# Patient Record
Sex: Female | Born: 1960
Health system: Southern US, Community
[De-identification: ages and names within clinical notes are randomized; demographics above are authoritative.]

## PROBLEM LIST (undated history)

## (undated) DIAGNOSIS — K760 Fatty (change of) liver, not elsewhere classified: Secondary | ICD-10-CM

## (undated) DIAGNOSIS — M419 Scoliosis, unspecified: Secondary | ICD-10-CM

## (undated) DIAGNOSIS — M7989 Other specified soft tissue disorders: Secondary | ICD-10-CM

## (undated) DIAGNOSIS — F32A Depression, unspecified: Secondary | ICD-10-CM

## (undated) DIAGNOSIS — M2550 Pain in unspecified joint: Secondary | ICD-10-CM

## (undated) DIAGNOSIS — I10 Essential (primary) hypertension: Secondary | ICD-10-CM

## (undated) DIAGNOSIS — I2510 Atherosclerotic heart disease of native coronary artery without angina pectoris: Secondary | ICD-10-CM

## (undated) DIAGNOSIS — G4730 Sleep apnea, unspecified: Secondary | ICD-10-CM

## (undated) DIAGNOSIS — E785 Hyperlipidemia, unspecified: Secondary | ICD-10-CM

## (undated) DIAGNOSIS — G40909 Epilepsy, unspecified, not intractable, without status epilepticus: Secondary | ICD-10-CM

## (undated) DIAGNOSIS — R0602 Shortness of breath: Secondary | ICD-10-CM

## (undated) DIAGNOSIS — G4733 Obstructive sleep apnea (adult) (pediatric): Secondary | ICD-10-CM

## (undated) DIAGNOSIS — E039 Hypothyroidism, unspecified: Secondary | ICD-10-CM

## (undated) DIAGNOSIS — I428 Other cardiomyopathies: Secondary | ICD-10-CM

## (undated) DIAGNOSIS — Z87891 Personal history of nicotine dependence: Secondary | ICD-10-CM

## (undated) DIAGNOSIS — F329 Major depressive disorder, single episode, unspecified: Secondary | ICD-10-CM

## (undated) DIAGNOSIS — M549 Dorsalgia, unspecified: Secondary | ICD-10-CM

## (undated) DIAGNOSIS — E119 Type 2 diabetes mellitus without complications: Secondary | ICD-10-CM

## (undated) DIAGNOSIS — I420 Dilated cardiomyopathy: Principal | ICD-10-CM

## (undated) DIAGNOSIS — J449 Chronic obstructive pulmonary disease, unspecified: Secondary | ICD-10-CM

## (undated) DIAGNOSIS — I5042 Chronic combined systolic (congestive) and diastolic (congestive) heart failure: Secondary | ICD-10-CM

## (undated) DIAGNOSIS — F419 Anxiety disorder, unspecified: Secondary | ICD-10-CM

## (undated) HISTORY — PX: UMBILICAL HERNIA REPAIR: SHX196

## (undated) HISTORY — PX: FASCIOTOMY: SHX132

## (undated) HISTORY — DX: Chronic combined systolic (congestive) and diastolic (congestive) heart failure: I50.42

## (undated) HISTORY — DX: Chronic obstructive pulmonary disease, unspecified: J44.9

## (undated) HISTORY — DX: Sleep apnea, unspecified: G47.30

## (undated) HISTORY — DX: Morbid (severe) obesity due to excess calories: E66.01

## (undated) HISTORY — DX: Obstructive sleep apnea (adult) (pediatric): G47.33

## (undated) HISTORY — DX: Type 2 diabetes mellitus without complications: E11.9

## (undated) HISTORY — DX: Other cardiomyopathies: I42.8

## (undated) HISTORY — PX: ABDOMINAL HYSTERECTOMY: SHX81

## (undated) HISTORY — DX: Essential (primary) hypertension: I10

## (undated) HISTORY — DX: Hyperlipidemia, unspecified: E78.5

## (undated) HISTORY — DX: Other specified soft tissue disorders: M79.89

## (undated) HISTORY — PX: OTHER SURGICAL HISTORY: SHX169

## (undated) HISTORY — DX: Fatty (change of) liver, not elsewhere classified: K76.0

## (undated) HISTORY — DX: Depression, unspecified: F32.A

## (undated) HISTORY — DX: Shortness of breath: R06.02

## (undated) HISTORY — DX: Anxiety disorder, unspecified: F41.9

## (undated) HISTORY — DX: Dorsalgia, unspecified: M54.9

## (undated) HISTORY — PX: CARPAL TUNNEL RELEASE: SHX101

## (undated) HISTORY — DX: Pain in unspecified joint: M25.50

## (undated) HISTORY — DX: Personal history of nicotine dependence: Z87.891

## (undated) HISTORY — DX: Scoliosis, unspecified: M41.9

## (undated) HISTORY — PX: VESICOVAGINAL FISTULA CLOSURE W/ TAH: SUR271

## (undated) HISTORY — DX: Atherosclerotic heart disease of native coronary artery without angina pectoris: I25.10

## (undated) HISTORY — DX: Epilepsy, unspecified, not intractable, without status epilepticus: G40.909

## (undated) HISTORY — DX: Hypothyroidism, unspecified: E03.9

## (undated) HISTORY — DX: Dilated cardiomyopathy: I42.0

---
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
## 1999-12-30 ENCOUNTER — Encounter (INDEPENDENT_AMBULATORY_CARE_PROVIDER_SITE_OTHER): Payer: Self-pay | Admitting: Specialist

## 1999-12-30 ENCOUNTER — Ambulatory Visit (HOSPITAL_COMMUNITY): Admission: RE | Admit: 1999-12-30 | Discharge: 1999-12-30 | Payer: Self-pay

## 2006-07-14 ENCOUNTER — Encounter: Admission: RE | Admit: 2006-07-14 | Discharge: 2006-07-14 | Payer: Self-pay | Admitting: Neurology

## 2010-07-02 NOTE — Op Note (Signed)
[facility]  Patient:    Judy [name], Judy [name]                     MRN: [id number] Proc. Date: 12/30/99 Adm. Date:  [id number] Attending:  Gennie Alma CC:         Roney Marion, M.D., [facility], [place], [place]                           Operative Report  CENTRAL [place] NUMBER:  [phone].  PREOPERATIVE DIAGNOSIS:  Umbilical hernia.  POSTOPERATIVE DIAGNOSIS:  Umbilical hernia.  OPERATION:  Repair of umbilical hernia with mesh.  SURGEON:  Milus Mallick, M.D.  ANESTHESIA:  General endotracheal.  DESCRIPTION OF PROCEDURE:  Under adequate general endotracheal anesthesia, the patients abdomen was prepared and draped in the usual fashion.  A smile incision was made just below the umbilicus and carrying down through the subcutaneous tissue.  Bleeders were electrocoagulated.  A moderate-sized hernia sac was immediately seen.  The subcutaneous tissue was dissected off of it, down to the deep fascia.  Next, the hernia sac was disconnected from the superior flap, which was the umbilicus.  There was one buttonhole made in the inferior portion of the umbilicus in so doing this and it was repaired with 4-0 Vicryl.  The sac was dissected away from the surrounding subcutaneous tissue, down to the deep fascia.  There was a good deal of preperitoneal fat that was adherent to it and this was excised with Bovie electrocoagulation. The sac was opened and there was omentum that was trying to herniate into it but it was pushed back into the abdomen and a high ligation of the sac was done with 2-0 Vicryl pursestring suture.  The remaining sac was trimmed off and removed from the operative field.  The defect in the fascia was cleared so that there was a margin of normal fascia around it, at least 2.5 cm in all directions.  The defect itself measured 3.5 cm in diameter.  The defect was closed with interrupted sutures of 0 Novofil.  A patch of  Marlex mesh was then placed over the defect, covering it, measuring 2.5 x 5.0 cm in long and short lengths, being oriented transversely.  The mesh was affixed to the fascia with interrupted sutures of 0 Prolene.  Next, the deep subcutaneous tissue was closed over the mesh with interrupted sutures of 3-0 Vicryl.  The subcuticular layer was then reapproximated with a continuous suture of 4-0 Vicryl and half-inch Steri-Strips were applied to the skin.  Sterile dressing was applied. Estimated blood loss for the procedure was negligible.  Patient tolerated the procedure well and left the operating room in satisfactory condition. DD:  12/30/99 TD:  12/30/99 Job: [id number]

## 2011-08-23 ENCOUNTER — Other Ambulatory Visit: Payer: Self-pay | Admitting: Family Medicine

## 2011-08-23 DIAGNOSIS — Q7649 Other congenital malformations of spine, not associated with scoliosis: Secondary | ICD-10-CM

## 2011-08-23 DIAGNOSIS — M5430 Sciatica, unspecified side: Secondary | ICD-10-CM

## 2011-08-23 DIAGNOSIS — M545 Low back pain: Secondary | ICD-10-CM

## 2011-08-23 DIAGNOSIS — M5412 Radiculopathy, cervical region: Secondary | ICD-10-CM

## 2011-08-23 DIAGNOSIS — M542 Cervicalgia: Secondary | ICD-10-CM

## 2011-08-27 ENCOUNTER — Ambulatory Visit
Admission: RE | Admit: 2011-08-27 | Discharge: 2011-08-27 | Disposition: A | Discharge status: Home / Self Care | Payer: BC Managed Care – PPO | Source: Ambulatory Visit | Attending: Family Medicine | Admitting: Family Medicine

## 2011-08-27 DIAGNOSIS — M542 Cervicalgia: Secondary | ICD-10-CM

## 2011-08-27 DIAGNOSIS — M5430 Sciatica, unspecified side: Secondary | ICD-10-CM

## 2011-08-27 DIAGNOSIS — M5412 Radiculopathy, cervical region: Secondary | ICD-10-CM

## 2011-08-27 DIAGNOSIS — M545 Low back pain: Secondary | ICD-10-CM

## 2011-08-27 DIAGNOSIS — Q7649 Other congenital malformations of spine, not associated with scoliosis: Secondary | ICD-10-CM

## 2011-08-27 MED ORDER — GADOBENATE DIMEGLUMINE 529 MG/ML IV SOLN
19.0000 mL | Freq: Once | INTRAVENOUS | Status: AC | PRN
  Administered 2011-08-27: 19 mL via INTRAVENOUS

## 2011-12-15 LAB — HM COLONOSCOPY

## 2012-03-08 DIAGNOSIS — M4030 Flatback syndrome, site unspecified: Secondary | ICD-10-CM | POA: Insufficient documentation

## 2012-05-31 DIAGNOSIS — M25551 Pain in right hip: Secondary | ICD-10-CM | POA: Insufficient documentation

## 2012-05-31 DIAGNOSIS — M79671 Pain in right foot: Secondary | ICD-10-CM | POA: Insufficient documentation

## 2012-09-20 DIAGNOSIS — M6749 Ganglion, multiple sites: Secondary | ICD-10-CM | POA: Insufficient documentation

## 2013-10-07 ENCOUNTER — Institutional Professional Consult (permissible substitution): Payer: BC Managed Care – PPO | Admitting: Internal Medicine

## 2013-10-08 ENCOUNTER — Institutional Professional Consult (permissible substitution): Payer: BC Managed Care – PPO | Admitting: Internal Medicine

## 2013-10-08 ENCOUNTER — Encounter: Payer: Self-pay | Admitting: Internal Medicine

## 2013-10-08 ENCOUNTER — Other Ambulatory Visit (INDEPENDENT_AMBULATORY_CARE_PROVIDER_SITE_OTHER): Payer: BC Managed Care – PPO

## 2013-10-08 ENCOUNTER — Ambulatory Visit (INDEPENDENT_AMBULATORY_CARE_PROVIDER_SITE_OTHER): Payer: BC Managed Care – PPO | Admitting: Internal Medicine

## 2013-10-08 ENCOUNTER — Encounter (INDEPENDENT_AMBULATORY_CARE_PROVIDER_SITE_OTHER): Payer: Self-pay

## 2013-10-08 VITALS — BP 142/100 | HR 91 | Temp 98.8°F | Ht 65.0 in | Wt 247.2 lb

## 2013-10-08 DIAGNOSIS — R0989 Other specified symptoms and signs involving the circulatory and respiratory systems: Secondary | ICD-10-CM

## 2013-10-08 DIAGNOSIS — R0600 Dyspnea, unspecified: Secondary | ICD-10-CM

## 2013-10-08 DIAGNOSIS — R0609 Other forms of dyspnea: Secondary | ICD-10-CM

## 2013-10-08 DIAGNOSIS — J449 Chronic obstructive pulmonary disease, unspecified: Secondary | ICD-10-CM

## 2013-10-08 DIAGNOSIS — R918 Other nonspecific abnormal finding of lung field: Secondary | ICD-10-CM

## 2013-10-08 LAB — TSH: TSH: 1.68 u[IU]/mL (ref 0.35–4.50)

## 2013-10-08 LAB — SEDIMENTATION RATE: Sed Rate: 18 mm/hr (ref 0–22)

## 2013-10-08 LAB — BRAIN NATRIURETIC PEPTIDE: PRO B NATRI PEPTIDE: 452 pg/mL — AB (ref 0.0–100.0)

## 2013-10-08 MED ORDER — CLONIDINE HCL 0.1 MG PO TABS: 0.1000 mg | ORAL_TABLET | Freq: Two times a day (BID) | ORAL | Status: DC

## 2013-10-08 NOTE — Progress Notes (Signed)
Subjective:     Patient ID: Judy [name], female   DOB: Jul 08, 1960,  MRN: [id number]  HPI  37 yowf quit smoking 2007 with cough that resolved and no resp problems until winter 2015 with doe x walking the dog s much in terms of cough then 10/04/13 sudden sense she couldn't get a breath and coughed up blood x one tsp plus slt green mucus  > better with saba and started on inhalers/ abx  >  better and pred taper.   10/08/2013 1st [facility] office visit/ Davarious Tumbleson Chief Complaint  Patient presents with  . Pulmonary Consult    Referred by [facility] Homans Cox-sob with exertion x 6-8 mths.,worse since 4 days ago,occass. cough-tsp. of blood 4 days ago,usually unprod.,no wheezing,midchest tightness,no fcs,Had neb. since yesterday(used 2x yesterday)   baseline = 50 ft   to mailbox and sob when gets there.   No obvious  patterns in day to day or daytime variabilty or assoc pleuritic or ex  cp or   subjective wheeze overt sinus or hb symptoms. No unusual exp hx or h/o childhood pna/ asthma or knowledge of premature birth.  Sleeping ok without nocturnal  or early am exacerbation  of respiratory  c/o's or need for noct saba. Also denies any obvious fluctuation of symptoms with weather or environmental changes or other aggravating or alleviating factors except as outlined above   Current Medications, Allergies, Complete Past Medical History, Past Surgical History, Family History, and Social History were reviewed in [employer] record.  ROS     ROS  The following are not active complaints unless bolded sore throat, dysphagia, dental problems, itching, sneezing,  nasal congestion or excess/ purulent secretions, ear ache,   fever, chills, sweats, unintended wt loss, pleuritic or exertional cp, hemoptysis,  orthopnea pnd or leg swelling, presyncope, palpitations, heartburn, abdominal pain, anorexia, nausea, vomiting, diarrhea  or change in bowel or urinary habits, change in stools or  urine, dysuria,hematuria,  rash, arthralgias, visual complaints, headache, numbness weakness or ataxia or problems with walking or coordination,  change in mood/affect or memory.                     Review of Systems     Objective:   Physical Exam  amb wf nad  Wt Readings from Last 3 Encounters:  10/08/13 247 lb 3.2 oz (112.129 kg)     HEENT: nl dentition, turbinates, and orophanx. Nl external ear canals without cough reflex   NECK :  without JVD/Nodes/TM/ nl carotid upstrokes bilaterally   LUNGS: no acc muscle use, clear to A and P bilaterally without cough on insp or exp maneuvers   CV:  RRR  no s3 or murmur or increase in P2, no edema   ABD:  soft and nontender with nl excursion in the supine position. No bruits or organomegaly, bowel sounds nl  MS:  warm without deformities, calf tenderness, cyanosis or clubbing  SKIN: warm and dry without lesions    NEURO:  alert, approp, no deficits   cxr 10/04/13 nl with nl cbc, bmet and d dimer   CT s contrast 10/07/13  Patchy GG changes RML and some in RUL and RLL     Lab Results  Component Value Date   ESRSEDRATE 18 10/08/2013     Lab Results  Component Value Date   PROBNP 452.0* 10/08/2013     Lab Results  Component Value Date   TSH 1.68 10/08/2013  Assessment:

## 2013-10-08 NOTE — Patient Instructions (Addendum)
Clonidine 0.1 mg twice daily until you return   Plan A = automatic = symbiocort 160 Take 2 puffs first thing in am and then another 2 puffs about 12 hours later.   Plan B = Only use your albuterol (proair)as a rescue medication to be used if you can't catch your breath by resting or doing a relaxed purse lip breathing pattern.  - The less you use it, the better it will work when you need it. - Ok to use up to 2 puffs  every 4 hours if you must but call for immediate appointment if use goes up over your usual need - Don't leave home without it !!  (think of it like the spare tire for your car)   Plan C = nebulizer albuterol, ok to use up to every 4 hours if can't get relief from Plan B  GERD (REFLUX)  is an extremely common cause of respiratory symptoms, many times with no significant heartburn at all.    It can be treated with medication, but also with lifestyle changes including avoidance of late meals, excessive alcohol, smoking cessation, and avoid fatty foods, chocolate, peppermint, colas, red wine, and acidic juices such as orange juice.  NO MINT OR MENTHOL PRODUCTS SO NO COUGH DROPS  USE SUGARLESS CANDY INSTEAD (jolley ranchers or Stover's)  NO OIL BASED VITAMINS - use powdered substitutes.   Please remember to go to the lab   department downstairs for your tests - we will call you with the results when they are available.     Please schedule a follow up office visit in 2 weeks, sooner if needed

## 2013-10-09 DIAGNOSIS — J449 Chronic obstructive pulmonary disease, unspecified: Secondary | ICD-10-CM | POA: Insufficient documentation

## 2013-10-09 DIAGNOSIS — R918 Other nonspecific abnormal finding of lung field: Secondary | ICD-10-CM | POA: Insufficient documentation

## 2013-10-09 NOTE — Assessment & Plan Note (Addendum)
-   10/08/2013  Walked RA x 2 laps @ 185 ft each stopped due to  Sob and back pain, no desat   Symptoms are markedly disproportionate to objective findings and not clear this is a lung problem but pt does appear to have difficult airway management issues. DDX of  difficult airways management all start with A and  include Adherence, Ace Inhibitors, Acid Reflux, Active Sinus Disease, Alpha 1 Antitripsin deficiency, Anxiety masquerading as Airways dz,  ABPA,  allergy(esp in young), Aspiration (esp in elderly), Adverse effects of DPI,  Active smokers, plus two Bs  = Bronchiectasis and Beta blocker use..and one C= CHF  Adherence is always the initial "prime suspect" and is a multilayered concern that requires a "trust but verify" approach in every patient - starting with knowing how to use medications, especially inhalers, correctly, keeping up with refills and understanding the fundamental difference between maintenance and prns vs those medications only taken for a very short course and then stopped and not refilled.   ? chf > bnp intermediate, try clonidine 0.1 mg bid for hbp and pain disorder  ? Acid (or non-acid) GERD > always difficult to exclude as up to 75% of pts in some series report no assoc GI/ Heartburn symptoms> rec  diet restrictions/ reviewed and instructions given in writing.

## 2013-10-09 NOTE — Assessment & Plan Note (Signed)
bnp >  esr does not suggest she has an active inflammatory component at this time but will f/u at 2 weeks

## 2013-10-09 NOTE — Progress Notes (Signed)
Quick Note:  Spoke with pt and notified of results per Dr. Wert. Pt verbalized understanding and denied any questions.  ______ 

## 2013-10-09 NOTE — Assessment & Plan Note (Signed)
Not clear how much underlying copd is present vs AB related to bronchopneumonia   The proper method of use, as well as anticipated side effects, of a metered-dose inhaler are discussed and demonstrated to the patient. Improved effectiveness after extensive coaching during this visit to a level of approximately  75% so continue symbiocrt for now 2bid and prn saba and then regroup in 4 weeks

## 2013-10-23 ENCOUNTER — Ambulatory Visit (INDEPENDENT_AMBULATORY_CARE_PROVIDER_SITE_OTHER): Payer: BC Managed Care – PPO | Admitting: Internal Medicine

## 2013-10-23 ENCOUNTER — Encounter: Payer: Self-pay | Admitting: Internal Medicine

## 2013-10-23 VITALS — BP 104/70 | HR 77 | Temp 99.0°F | Ht 66.0 in | Wt 241.0 lb

## 2013-10-23 DIAGNOSIS — I10 Essential (primary) hypertension: Secondary | ICD-10-CM

## 2013-10-23 DIAGNOSIS — J449 Chronic obstructive pulmonary disease, unspecified: Secondary | ICD-10-CM

## 2013-10-23 DIAGNOSIS — R918 Other nonspecific abnormal finding of lung field: Secondary | ICD-10-CM

## 2013-10-23 DIAGNOSIS — J4489 Other specified chronic obstructive pulmonary disease: Secondary | ICD-10-CM

## 2013-10-23 MED ORDER — BUDESONIDE-FORMOTEROL FUMARATE 160-4.5 MCG/ACT IN AERO: 2.0000 | INHALATION_SPRAY | Freq: Two times a day (BID) | RESPIRATORY_TRACT | Status: DC

## 2013-10-23 NOTE — Patient Instructions (Addendum)
   Plan A = automatic = symbiocort 160 Take 2 puffs first thing in am and then another 2 puffs about 12 hours later.   Plan B = Only use your albuterol (proair)as a rescue medication to be used if you can't catch your breath by resting or doing a relaxed purse lip breathing pattern.  - The less you use it, the better it will work when you need it. - Ok to use up to 2 puffs  every 4 hours if you must but call for immediate appointment if use goes up over your usual need - Don't leave home without it !!  (think of it like the spare tire for your car)          Please schedule a follow up office visit in 4 weeks, sooner if needed with pfts

## 2013-10-23 NOTE — Progress Notes (Signed)
Subjective:     Patient ID: Judy [name], female   DOB: [birth year],  MRN: [id number]    Brief patient profile:  27 yowf quit smoking 2007 with cough that resolved and no resp problems until winter 2015 with doe x walking the dog s much in terms of cough then 10/04/13 sudden sense she couldn't get a breath and coughed up blood x one tsp plus slt green mucus  > better with saba and started on inhalers/ abx  >  better and pred taper.  History of Present Illness  10/08/2013 1st [facility] office visit/ Judy [name] Chief Complaint  Patient presents with  . Pulmonary Consult    Referred by Penn[facility] Homans Cox-sob with exertion x 6-8 mths.,worse since 4 days ago,occass. cough-tsp. of blood 4 days ago,usually unprod.,no wheezing,midchest tightness,no fcs,Had neb. since yesterday(used 2x yesterday)   baseline = 173ft   to mailbox and sob when gets there.  rec Clonidine 0.1 mg twice daily until you return  Plan A = automatic = symbiocort 160 Take 2 puffs first thing in am and then another 2 puffs about 12 hours later.  Plan B = Only use your albuterol (proair)as a rescue medication    Plan C = nebulizer albuterol, ok to use up to every 4 hours if can't get relief from Plan B GERD diet           10/23/2013 f/u ov/Judy [name] re: unexplained doe/better on symbicort not needing rescue saba Chief Complaint  Patient presents with  . Follow-up    [name],  but not back to baseline yet. Denies any cough. No new co's today and has not needed neb.   cough resolved / happy with clonidine for bp, back on asa s hemoptysis    No obvious  patterns in day to day or daytime variabilty or assoc pleuritic or ex  cp or   subjective wheeze overt sinus or hb symptoms. No unusual exp hx or h/o childhood pna/ asthma or knowledge of premature birth.  Sleeping ok without nocturnal  or early am exacerbation  of respiratory  c/o's or need for noct saba. Also denies any obvious fluctuation of  symptoms with weather or environmental changes or other aggravating or alleviating factors except as outlined above   Current Medications, Allergies, Complete Past Medical History, Past Surgical History, Family History, and Social History were reviewed in [employer] record.     ROS  The following are not active complaints unless bolded sore throat, dysphagia, dental problems, itching, sneezing,  nasal congestion or excess/ purulent secretions, ear ache,   fever, chills, sweats, unintended wt loss, pleuritic or exertional cp, hemoptysis,  orthopnea pnd or leg swelling, presyncope, palpitations, heartburn, abdominal pain, anorexia, nausea, vomiting, diarrhea  or change in bowel or urinary habits, change in stools or urine, dysuria,hematuria,  rash, arthralgias, visual complaints, headache, numbness weakness or ataxia or problems with walking or coordination,  change in mood/affect or memory.                          Objective:   Physical Exam  amb wf nad   Wt Readings from Last 3 Encounters:  10/23/13 241 lb (109.317 kg)  10/08/13 247 lb 3.2 oz (112.129 kg)      HEENT: nl dentition, turbinates, and orophanx. Nl external ear canals without cough reflex   NECK :  without JVD/Nodes/TM/ nl carotid upstrokes bilaterally   LUNGS:  no acc muscle use, clear to A and P bilaterally without cough on insp or exp maneuvers   CV:  RRR  no s3 or murmur or increase in P2, no edema   ABD:  soft and nontender with nl excursion in the supine position. No bruits or organomegaly, bowel sounds nl  MS:  warm without deformities, calf tenderness, cyanosis or clubbing  SKIN: warm and dry without lesions      cxr 10/04/13 nl with nl cbc, bmet and d dimer   CT s contrast 10/07/13  Patchy GG changes RML and some in RUL and RLL     Lab Results  Component Value Date   ESRSEDRATE 18 10/08/2013     Lab Results  Component Value Date   PROBNP 452.0* 10/08/2013     Lab  Results  Component Value Date   TSH 1.68 10/08/2013           Assessment:

## 2013-10-26 DIAGNOSIS — I152 Hypertension secondary to endocrine disorders: Secondary | ICD-10-CM | POA: Insufficient documentation

## 2013-10-26 DIAGNOSIS — I10 Essential (primary) hypertension: Secondary | ICD-10-CM | POA: Insufficient documentation

## 2013-10-26 DIAGNOSIS — E1159 Type 2 diabetes mellitus with other circulatory complications: Secondary | ICD-10-CM | POA: Insufficient documentation

## 2013-10-26 NOTE — Assessment & Plan Note (Signed)
Improving on clonidine which was picked because of her pain disorder (synergistic with opioids)

## 2013-10-26 NOTE — Assessment & Plan Note (Signed)
See CT chest 10/07/13 ? Bronchopna/ blood/edema?  Since so many lobes involved this was either pna or alv hem ? From hbp > clinically has resolved and since only seen on ct chest no need to repeat cxr at this point

## 2013-10-26 NOTE — Assessment & Plan Note (Signed)
Improving on symbicort / no need for saba > no change rx  Needs to return for pfts

## 2013-11-26 ENCOUNTER — Ambulatory Visit: Payer: BC Managed Care – PPO | Admitting: Internal Medicine

## 2013-11-26 ENCOUNTER — Other Ambulatory Visit: Payer: Self-pay | Admitting: Internal Medicine

## 2013-11-26 DIAGNOSIS — R0600 Dyspnea, unspecified: Secondary | ICD-10-CM

## 2013-11-27 ENCOUNTER — Ambulatory Visit (INDEPENDENT_AMBULATORY_CARE_PROVIDER_SITE_OTHER): Payer: BC Managed Care – PPO | Admitting: Internal Medicine

## 2013-11-27 ENCOUNTER — Encounter: Payer: Self-pay | Admitting: Internal Medicine

## 2013-11-27 VITALS — BP 124/90 | HR 89 | Temp 98.9°F | Ht 64.5 in | Wt 237.0 lb

## 2013-11-27 DIAGNOSIS — J449 Chronic obstructive pulmonary disease, unspecified: Secondary | ICD-10-CM

## 2013-11-27 DIAGNOSIS — I10 Essential (primary) hypertension: Secondary | ICD-10-CM

## 2013-11-27 DIAGNOSIS — R0600 Dyspnea, unspecified: Secondary | ICD-10-CM

## 2013-11-27 LAB — PULMONARY FUNCTION TEST
DL/VA % PRED: 72 %
DL/VA: 3.51 ml/min/mmHg/L
DLCO UNC % PRED: 71 %
DLCO unc: 17.89 ml/min/mmHg
FEF 25-75 POST: 0.84 L/s
FEF 25-75 Pre: 0.7 L/sec
FEF2575-%Change-Post: 19 %
FEF2575-%PRED-POST: 31 %
FEF2575-%PRED-PRE: 26 %
FEV1-%Change-Post: 7 %
FEV1-%PRED-POST: 58 %
FEV1-%Pred-Pre: 54 %
FEV1-POST: 1.62 L
FEV1-PRE: 1.51 L
FEV1FVC-%CHANGE-POST: 0 %
FEV1FVC-%PRED-PRE: 65 %
FEV6-%Change-Post: 5 %
FEV6-%Pred-Post: 87 %
FEV6-%Pred-Pre: 82 %
FEV6-POST: 3.01 L
FEV6-Pre: 2.84 L
FEV6FVC-%CHANGE-POST: 0 %
FEV6FVC-%Pred-Post: 100 %
FEV6FVC-%Pred-Pre: 100 %
FVC-%CHANGE-POST: 6 %
FVC-%Pred-Post: 87 %
FVC-%Pred-Pre: 82 %
FVC-Post: 3.09 L
FVC-Pre: 2.92 L
PRE FEV1/FVC RATIO: 52 %
PRE FEV6/FVC RATIO: 97 %
Post FEV1/FVC ratio: 52 %
Post FEV6/FVC ratio: 97 %
RV % PRED: 106 %
RV: 2 L
TLC % pred: 101 %
TLC: 5.2 L

## 2013-11-27 NOTE — Progress Notes (Signed)
Subjective:     Patient ID: Judy [name], female   DOB: 1960-07-09,  MRN: [id number]    Brief patient profile:  41 yowf quit smoking 2007 with cough that resolved and no resp problems until winter 2015 with doe x walking the dog s much in terms of cough then 10/04/13 sudden sense she couldn't get a breath and coughed up blood x one tsp plus slt green mucus  > better with saba and started on inhalers/ abx  >  better and pred taper.  History of Present Illness  10/08/2013 1st [facility] office visit/ Maury Bamba Chief Complaint  Patient presents with  . Pulmonary Consult    Referred by [facility] Homans Cox-sob with exertion x 6-8 mths.,worse since 4 days ago,occass. cough-tsp. of blood 4 days ago,usually unprod.,no wheezing,midchest tightness,no fcs,Had neb. since yesterday(used 2x yesterday)   baseline = 166ft   to mailbox and sob when gets there.  rec Clonidine 0.1 mg twice daily until you return  Plan A = automatic = symbiocort 160 Take 2 puffs first thing in am and then another 2 puffs about 12 hours later.  Plan B = Only use your albuterol (proair)as a rescue medication    Plan C = nebulizer albuterol, ok to use up to every 4 hours if can't get relief from Plan B GERD diet           10/23/2013 f/u ov/Keanu Lesniak re: unexplained doe/better on symbicort not needing rescue saba Chief Complaint  Patient presents with  . Follow-up    [name],  but not back to baseline yet. Denies any cough. No new co's today and has not needed neb.   cough resolved / happy with clonidine for bp, back on asa s hemoptysis   rec Plan A = automatic = symbiocort 160 Take 2 puffs first thing in am and then another 2 puffs about 12 hours later.  Plan B = Only use your albuterol (proair) as needed    11/27/2013 f/u ov/Annessa Satre re: GOLD II copd  Chief Complaint  Patient presents with  . Follow-up    PFT done today. Pt states that her breathing is slightly improved since the last visit. No  new co's today. She has not used rescue inhaler, but has been using symbicort prn- 3 to 4 x per wk on average.   not using any saba at all    Not limited by breathing from desired activities    No obvious  patterns in day to day or daytime variabilty or assoc cough or pleuritic or ex  cp or   subjective wheeze overt sinus or hb symptoms. No unusual exp hx or h/o childhood pna/ asthma or knowledge of premature birth.  Sleeping ok without nocturnal  or early am exacerbation  of respiratory  c/o's or need for noct saba. Also denies any obvious fluctuation of symptoms with weather or environmental changes or other aggravating or alleviating factors except as outlined above   Current Medications, Allergies, Complete Past Medical History, Past Surgical History, Family History, and Social History were reviewed in [employer] record.     ROS  The following are not active complaints unless bolded sore throat, dysphagia, dental problems, itching, sneezing,  nasal congestion or excess/ purulent secretions, ear ache,   fever, chills, sweats, unintended wt loss, pleuritic or exertional cp, hemoptysis,  orthopnea pnd or leg swelling, presyncope, palpitations, heartburn, abdominal pain, anorexia, nausea, vomiting, diarrhea  or change in bowel  or urinary habits, change in stools or urine, dysuria,hematuria,  rash, arthralgias, visual complaints, headache, numbness weakness or ataxia or problems with walking or coordination,  change in mood/affect or memory.                          Objective:   Physical Exam  amb wf nad  11/27/2013     237  Wt Readings from Last 3 Encounters:  10/23/13 241 lb (109.317 kg)  10/08/13 247 lb 3.2 oz (112.129 kg)      HEENT: nl dentition, turbinates, and orophanx. Nl external ear canals without cough reflex   NECK :  without JVD/Nodes/TM/ nl carotid upstrokes bilaterally   LUNGS: no acc muscle use, clear to A and P bilaterally without  cough on insp or exp maneuvers   CV:  RRR  no s3 or murmur or increase in P2, no edema   ABD:  soft and nontender with nl excursion in the supine position. No bruits or organomegaly, bowel sounds nl  MS:  warm without deformities, calf tenderness, cyanosis or clubbing  SKIN: warm and dry without lesions      cxr 10/04/13 nl with nl cbc, bmet and d dimer   CT s contrast 10/07/13  Patchy GG changes RML and some in RUL and RLL     Lab Results  Component Value Date   ESRSEDRATE 18 10/08/2013     Lab Results  Component Value Date   PROBNP 452.0* 10/08/2013     Lab Results  Component Value Date   TSH 1.68 10/08/2013           Assessment:

## 2013-11-27 NOTE — Assessment & Plan Note (Addendum)
With bnp > 100 could have  an element of diastolic chf previously but doing great on clonidine so no need to change rx for now  F/u Dr cox

## 2013-11-27 NOTE — Patient Instructions (Addendum)
GOLD II copd is mild to moderate and unlikely to worsen unless you resume smoking  Symbicort 160 Take 2 puffs first thing in am and then another 2 puffs about 12 hours later.    If you are satisfied with your treatment plan,  let your doctor know and he/she can either refill your medications or you can return here when your prescription runs out.     If in any way you are not 100% satisfied,  please tell us.  If 100% better, tell your friends!  Pulmonary follow up is as needed

## 2013-11-27 NOTE — Assessment & Plan Note (Addendum)
-   10/08/2013 p extensive coaching HFA effectiveness =    75%  - 11/27/2013  PFTs   FEV1  1.51(54%) ratio 52 and and no better p B2 and DLCO  71  The proper method of use, as well as anticipated side effects, of a metered-dose inhaler are discussed and demonstrated to the patient. Improved effectiveness after extensive coaching during this visit to a level of approximately  90%     I reviewed the Flethcher curve with patient that basically indicates  if you quit smoking when your best day FEV1 is still well preserved (as is the case here)  it is highly unlikely you will progress to severe disease and informed the patient there was no medication on the market that has proven to change the curve or the likelihood of progression.  Therefore stopping smoking and maintaining abstinence is the most important aspect of care, not choice of inhalers or for that matter, doctors.   Treatment is all directed at symptoms from this point forward, so ok to "titrate or adjust" the symbicort though no necessarily the way it's usually used.  F/u here can be prn

## 2013-11-27 NOTE — Progress Notes (Signed)
PFT done today. 

## 2014-01-08 ENCOUNTER — Other Ambulatory Visit (INDEPENDENT_AMBULATORY_CARE_PROVIDER_SITE_OTHER): Payer: BC Managed Care – PPO

## 2014-01-08 ENCOUNTER — Ambulatory Visit (INDEPENDENT_AMBULATORY_CARE_PROVIDER_SITE_OTHER): Payer: BC Managed Care – PPO | Admitting: Internal Medicine

## 2014-01-08 ENCOUNTER — Ambulatory Visit (INDEPENDENT_AMBULATORY_CARE_PROVIDER_SITE_OTHER)
Admission: RE | Admit: 2014-01-08 | Discharge: 2014-01-08 | Disposition: A | Discharge status: Home / Self Care | Payer: BC Managed Care – PPO | Source: Ambulatory Visit | Attending: Internal Medicine | Admitting: Internal Medicine

## 2014-01-08 ENCOUNTER — Ambulatory Visit (HOSPITAL_COMMUNITY): Payer: BC Managed Care – PPO | Attending: Cardiology | Admitting: Cardiology

## 2014-01-08 ENCOUNTER — Encounter: Payer: Self-pay | Admitting: Internal Medicine

## 2014-01-08 VITALS — BP 118/84 | HR 91 | Ht 65.0 in | Wt 235.5 lb

## 2014-01-08 DIAGNOSIS — R05 Cough: Secondary | ICD-10-CM

## 2014-01-08 DIAGNOSIS — I10 Essential (primary) hypertension: Secondary | ICD-10-CM | POA: Diagnosis not present

## 2014-01-08 DIAGNOSIS — R0600 Dyspnea, unspecified: Secondary | ICD-10-CM

## 2014-01-08 DIAGNOSIS — J449 Chronic obstructive pulmonary disease, unspecified: Secondary | ICD-10-CM | POA: Diagnosis not present

## 2014-01-08 DIAGNOSIS — I313 Pericardial effusion (noninflammatory): Secondary | ICD-10-CM | POA: Insufficient documentation

## 2014-01-08 DIAGNOSIS — R059 Cough, unspecified: Secondary | ICD-10-CM | POA: Insufficient documentation

## 2014-01-08 DIAGNOSIS — I340 Nonrheumatic mitral (valve) insufficiency: Secondary | ICD-10-CM | POA: Insufficient documentation

## 2014-01-08 DIAGNOSIS — E669 Obesity, unspecified: Secondary | ICD-10-CM | POA: Insufficient documentation

## 2014-01-08 LAB — CBC WITH DIFFERENTIAL/PLATELET
BASOS PCT: 0.6 % (ref 0.0–3.0)
Basophils Absolute: 0.1 10*3/uL (ref 0.0–0.1)
EOS PCT: 0.8 % (ref 0.0–5.0)
Eosinophils Absolute: 0.1 10*3/uL (ref 0.0–0.7)
HCT: 40.6 % (ref 36.0–46.0)
HEMOGLOBIN: 13.4 g/dL (ref 12.0–15.0)
LYMPHS PCT: 12.9 % (ref 12.0–46.0)
Lymphs Abs: 1.5 10*3/uL (ref 0.7–4.0)
MCHC: 33 g/dL (ref 30.0–36.0)
MCV: 92.8 fl (ref 78.0–100.0)
MONOS PCT: 5.2 % (ref 3.0–12.0)
Monocytes Absolute: 0.6 10*3/uL (ref 0.1–1.0)
NEUTROS ABS: 9.5 10*3/uL — AB (ref 1.4–7.7)
NEUTROS PCT: 80.5 % — AB (ref 43.0–77.0)
Platelets: 295 10*3/uL (ref 150.0–400.0)
RBC: 4.37 Mil/uL (ref 3.87–5.11)
RDW: 15.4 % (ref 11.5–15.5)
WBC: 11.8 10*3/uL — AB (ref 4.0–10.5)

## 2014-01-08 LAB — BASIC METABOLIC PANEL
BUN: 16 mg/dL (ref 6–23)
CO2: 28 mEq/L (ref 19–32)
Calcium: 9.5 mg/dL (ref 8.4–10.5)
Chloride: 107 mEq/L (ref 96–112)
Creatinine, Ser: 0.7 mg/dL (ref 0.4–1.2)
GFR: 96.02 mL/min (ref >60.00)
GLUCOSE: 118 mg/dL — AB (ref 70–99)
POTASSIUM: 4.5 meq/L (ref 3.5–5.1)
SODIUM: 144 meq/L (ref 135–145)

## 2014-01-08 LAB — BRAIN NATRIURETIC PEPTIDE: PRO B NATRI PEPTIDE: 435 pg/mL — AB (ref 0.0–100.0)

## 2014-01-08 LAB — SEDIMENTATION RATE: Sed Rate: 35 mm/hr — ABNORMAL HIGH (ref 0–22)

## 2014-01-08 MED ORDER — AMOXICILLIN-POT CLAVULANATE 875-125 MG PO TABS: 1.0000 | ORAL_TABLET | Freq: Two times a day (BID) | ORAL | Status: DC

## 2014-01-08 MED ORDER — PREDNISONE 10 MG PO TABS: ORAL_TABLET | ORAL | Status: DC

## 2014-01-08 MED ORDER — IOHEXOL 350 MG/ML SOLN
100.0000 mL | Freq: Once | INTRAVENOUS | Status: AC | PRN
  Administered 2014-01-08: 100 mL via INTRAVENOUS

## 2014-01-08 NOTE — Progress Notes (Signed)
Subjective:     Patient ID: Judy [name], female   DOB: 1960/08/22,  MRN: [id number]    Brief patient profile:  [age] yowf quit smoking 2007 with cough that resolved and no resp problems until winter 2015 with doe x walking the dog s much in terms of cough then 10/04/13 sudden sense she couldn't get a breath and coughed up blood x one tsp plus slt green mucus  > better with saba and started on inhalers/ abx  >  better and pred taper.  History of Present Illness  10/08/2013 1st [facility] office visit/ Judy [name] Chief Complaint  Patient presents with  . Pulmonary Consult    Referred by Penni HomansKiersten Cox-sob with exertion x 6-8 mths.,worse since 4 days ago,occass. cough-tsp. of blood 4 days ago,usually unprod.,no wheezing,midchest tightness,no fcs,Had neb. since yesterday(used 2x yesterday)   baseline = 13300ft   to mailbox and sob when gets there.  rec Clonidine 0.1 mg twice daily until you return  Plan A = automatic = symbiocort 160 Take 2 puffs first thing in am and then another 2 puffs about 12 hours later.  Plan B = Only use your albuterol (proair)as a rescue medication    Plan C = nebulizer albuterol, ok to use up to every 4 hours if can't get relief from Plan B GERD diet           10/23/2013 f/u ov/Judy [name] re: unexplained doe/better on symbicort not needing rescue saba Chief Complaint  Patient presents with  . Follow-up    [name],  but not back to baseline yet. Denies any cough. No new co's today and has not needed neb.   cough resolved / happy with clonidine for bp, back on asa s hemoptysis   rec Plan A = automatic = symbiocort 160 Take 2 puffs first thing in am and then another 2 puffs about 12 hours later.  Plan B = Only use your albuterol (proair) as needed    11/27/2013 f/u ov/Judy [name] re: GOLD II copd  Chief Complaint  Patient presents with  . Follow-up    PFT done today. Pt states that her breathing is slightly improved since the last visit. No  new co's today. She has not used rescue inhaler, but has been using symbicort prn- 3 to 4 x per wk on average.   not using any saba at all    Not limited by breathing from desired activities   rec GOLD II copd is mild to moderate and unlikely to worsen unless you resume smoking Symbicort 160 Take 2 puffs first thing in am and then another 2 puffs about 12 hours later   01/08/2014 acute ov/Judy [name] re:  uneplained sob/ cough  Chief Complaint  Patient presents with  . Follow-up    SOB cough productive red greenish mucus   better p last ov but then noticed recurrent  sob walking dogs despite symbicort 160 w/in a week or so of ov then then much worse with bad cough green/bloody mucus x 5 days rx with change to anoro no better so far.    No obvious  patterns in day to day or daytime variabilty or assoc or pleuritic or ex  cp or   subjective wheeze overt sinus or hb symptoms. No unusual exp hx or h/o childhood pna/ asthma or knowledge of premature birth.  Sleeping ok without nocturnal  or early am exacerbation  of respiratory  c/o's or need for noct saba. Also  denies any obvious fluctuation of symptoms with weather or environmental changes or other aggravating or alleviating factors except as outlined above   Current Medications, Allergies, Complete Past Medical History, Past Surgical History, Family History, and Social History were reviewed in [employer] record.     ROS  The following are not active complaints unless bolded sore throat, dysphagia, dental problems, itching, sneezing,  nasal congestion or excess/ purulent secretions, ear ache,   fever, chills, sweats, unintended wt loss, pleuritic or exertional cp, hemoptysis,  orthopnea pnd or leg swelling, presyncope, palpitations, heartburn, abdominal pain, anorexia, nausea, vomiting, diarrhea  or change in bowel or urinary habits, change in stools or urine, dysuria,hematuria,  rash, arthralgias, visual complaints, headache,  numbness weakness or ataxia or problems with walking or coordination,  change in mood/affect or memory.                          Objective:   Physical Exam  amb wf nad  11/27/2013     237 >   01/08/2014  236  Wt Readings from Last 3 Encounters:  10/23/13 241 lb (109.317 kg)  10/08/13 247 lb 3.2 oz (112.129 kg)      HEENT: nl dentition, turbinates, and orophanx. Nl external ear canals without cough reflex   NECK :  without JVD/Nodes/TM/ nl carotid upstrokes bilaterally   LUNGS: no acc muscle use, clear to A and P bilaterally without cough on insp or exp maneuvers   CV:  RRR  no s3 or murmur or increase in P2, no edema   ABD:  soft and nontender with nl excursion in the supine position. No bruits or organomegaly, bowel sounds nl  MS:  warm without deformities, calf tenderness, cyanosis or clubbing  SKIN: warm and dry without lesions     CTa 01/08/2014  Bilateral effusions, non specific gg changes   Recent Labs Lab 01/08/14 1022  NA 144  K 4.5  CL 107  CO2 28  BUN 16  CREATININE 0.7  GLUCOSE 118*    Recent Labs Lab 01/08/14 1022  HGB 13.4  HCT 40.6  WBC 11.8*  PLT 295.0     Lab Results  Component Value Date   TSH 1.68 10/08/2013     Lab Results  Component Value Date   PROBNP 435.0* 01/08/2014     Lab Results  Component Value Date   ESRSEDRATE 35* 01/08/2014                 Assessment:

## 2014-01-08 NOTE — Assessment & Plan Note (Signed)
-   10/08/2013  Walked RA x 2 laps @ 185 ft each stopped due to  Sob and back pain, no desat  - 01/08/2014   Walked RA x one lap @ 185 stopped due to  Sob with desat 89%  - CTa chest 01/08/2014 > new bilateral effusions/ non specific gg changes     Not clear what this is but appeared to resolved clinically p short course of steroids / abx previously   rec repeat pred x 6 days and obtain echo asap to be sure no element of chf   In meantime hold asa when noting any hemoptysis

## 2014-01-08 NOTE — Assessment & Plan Note (Signed)
-   sinus Ct 01/08/2014 > No evidence of chronic sinusitis  rx with augmentin x 10 days based on reported green sputum.  Highly unlikely this is "atypical infection" though will keep in ddx  With fob likely next step if doesn't clear it.

## 2014-01-08 NOTE — Patient Instructions (Addendum)
Please see patient coordinator before you leave today  to schedule CTa of chest and Sinus CT asap and echo as well   Please remember to go to the lab  department downstairs for your tests - we will call you with the results when they are available.    Start back on symbicort 160 Take 2 puffs first thing in am and then another 2 puffs about 12 hours later.   Try prilosec 20mg   Take 30-60 min before first meal of the day and Pepcid 20 mg one bedtime until return  Augmentin 875 mg take one pill twice daily  X 10 days - take at breakfast and supper with large glass of water.  It would help reduce the usual side effects (diarrhea and yeast infections) if you ate cultured yogurt at lunch.   No aspirin with any bleeding - hold until no blood x 3 straight days  Please schedule a follow up office visit in 2 weeks, sooner if needed with all active meds in hand Late add Prednisone 10 mg take  4 each am x 2 days,   2 each am x 2 days,  1 each am x 2 days and stop

## 2014-01-08 NOTE — Progress Notes (Signed)
Echo performed. 

## 2014-01-08 NOTE — Assessment & Plan Note (Signed)
-   11/27/2013  PFTs   FEV1  1.51(54%) ratio 52 and and no better p B2 and DLCO  71 - 01/08/2014  p extensive coaching HFA effectiveness =    90% > rec resume symbicort 160 2bid

## 2014-01-09 LAB — D-DIMER, QUANTITATIVE (NOT AT ARMC): D DIMER QUANT: 0.56 ug{FEU}/mL — AB (ref 0.00–0.48)

## 2014-01-10 ENCOUNTER — Other Ambulatory Visit: Payer: Self-pay | Admitting: Internal Medicine

## 2014-01-10 MED ORDER — FUROSEMIDE 20 MG PO TABS: 20.0000 mg | ORAL_TABLET | Freq: Every day | ORAL | Status: DC

## 2014-01-10 NOTE — Progress Notes (Signed)
Quick Note:  Spoke with pt and notified of results per Dr. Wert. Pt verbalized understanding and denied any questions.  ______ 

## 2014-01-21 ENCOUNTER — Ambulatory Visit (INDEPENDENT_AMBULATORY_CARE_PROVIDER_SITE_OTHER): Payer: BC Managed Care – PPO | Admitting: Internal Medicine

## 2014-01-21 ENCOUNTER — Encounter: Payer: Self-pay | Admitting: Internal Medicine

## 2014-01-21 VITALS — BP 120/60 | HR 90 | Ht 65.0 in | Wt 241.0 lb

## 2014-01-21 DIAGNOSIS — R0600 Dyspnea, unspecified: Secondary | ICD-10-CM

## 2014-01-21 DIAGNOSIS — J449 Chronic obstructive pulmonary disease, unspecified: Secondary | ICD-10-CM

## 2014-01-21 DIAGNOSIS — R918 Other nonspecific abnormal finding of lung field: Secondary | ICD-10-CM

## 2014-01-21 DIAGNOSIS — I10 Essential (primary) hypertension: Secondary | ICD-10-CM

## 2014-01-21 NOTE — Progress Notes (Signed)
Subjective:     Patient ID: Judy [name], female   DOB: Jan 22, 1961,  MRN: [id number]    Brief patient profile:  53 yowf quit smoking 2007  @  150 lb  with cough that resolved and no resp problems until winter 2015 with doe x walking the dog s much in terms of cough then 10/04/13 sudden sense she couldn't get a breath and coughed up blood x one tsp plus slt green mucus  > better with saba and started on inhalers/ abx  >  better and pred taper and dx of GOLD II copd was made 11/2013    History of Present Illness  10/08/2013 1st [facility] office visit/ Judy [name] Chief Complaint  Patient presents with  . Pulmonary Consult    Referred by Penni Homans Cox-sob with exertion x 6-8 mths.,worse since 4 days ago,occass. cough-tsp. of blood 4 days ago,usually unprod.,no wheezing,midchest tightness,no fcs,Had neb. since yesterday(used 2x yesterday)   baseline = 14ft   to mailbox and sob when gets there.  rec Clonidine 0.1 mg twice daily until you return  Plan A = automatic = symbiocort 160 Take 2 puffs first thing in am and then another 2 puffs about 12 hours later.  Plan B = Only use your albuterol (proair)as a rescue medication    Plan C = nebulizer albuterol, ok to use up to every 4 hours if can't get relief from Plan B GERD diet           10/23/2013 f/u ov/Judy [name] re: unexplained doe/better on symbicort not needing rescue saba Chief Complaint  Patient presents with  . Follow-up    [name],  but not back to baseline yet. Denies any cough. No new co's today and has not needed neb.   cough resolved / happy with clonidine for bp, back on asa s hemoptysis   rec Plan A = automatic = symbiocort 160 Take 2 puffs first thing in am and then another 2 puffs about 12 hours later.  Plan B = Only use your albuterol (proair) as needed    11/27/2013 f/u ov/Judy [name] re: GOLD II copd  Chief Complaint  Patient presents with  . Follow-up    PFT done today. Pt states that her  breathing is slightly improved since the last visit. No new co's today. She has not used rescue inhaler, but has been using symbicort prn- 3 to 4 x per wk on average.   not using any saba at all    Not limited by breathing from desired activities   rec GOLD II copd is mild to moderate and unlikely to worsen unless you resume smoking Symbicort 160 Take 2 puffs first thing in am and then another 2 puffs about 12 hours later   01/08/2014 acute ov/Judy [name] re:  uneplained sob/ cough  Chief Complaint  Patient presents with  . Follow-up    SOB cough productive red greenish mucus   better p last ov but then noticed recurrent  sob walking dogs despite symbicort 160 w/in a week or so of ov then then much worse with bad cough green/bloody mucus x 5 days rx with change to anoro no better so far rec Please see patient coordinator before you leave today  to schedule CTa of chest and Sinus CT asap and echo as well   Please remember to go to the lab  department downstairs for your tests - we will call you with the results when they are available.  Start back on symbicort 160 Take 2 puffs first thing in am and then another 2 puffs about 12 hours later.   Try prilosec 20mg   Take 30-60 min before first meal of the day and Pepcid 20 mg one bedtime until return  Augmentin 875 mg take one pill twice daily  X 10 days - take at breakfast and supper with large glass of water.  It would help reduce the usual side effects (diarrhea and yeast infections) if you ate cultured yogurt at lunch.   No aspirin with any bleeding - hold until no blood x 3 straight days  Please schedule a follow up office visit in 2 weeks, sooner if needed with all active meds in hand Late add Prednisone 10 mg take  4 each am x 2 days,   2 each am x 2 days,  1 each am x 2 days and stop    01/21/2014 f/u ov/Judy [name] re: GOLD II copd/ MR Chief Complaint  Patient presents with  . Follow-up    Pt states that her breathing has improved since the  last visit. She is using proair about 3 x per wk. No new co's today.   Not limited by breathing from desired activities  But very sedentary      No obvious  patterns in day to day or daytime variabilty or assoccough  or pleuritic or ex  cp or   subjective wheeze overt sinus or hb symptoms. No unusual exp hx or h/o childhood pna/ asthma or knowledge of premature birth.  Sleeping ok without nocturnal  or early am exacerbation  of respiratory  c/o's or need for noct saba. Also denies any obvious fluctuation of symptoms with weather or environmental changes or other aggravating or alleviating factors except as outlined above   Current Medications, Allergies, Complete Past Medical History, Past Surgical History, Family History, and Social History were reviewed in [employer] record.     ROS  The following are not active complaints unless bolded sore throat, dysphagia, dental problems, itching, sneezing,  nasal congestion or excess/ purulent secretions, ear ache,   fever, chills, sweats, unintended wt loss, pleuritic or exertional cp, hemoptysis,  orthopnea pnd or leg swelling, presyncope, palpitations, heartburn, abdominal pain, anorexia, nausea, vomiting, diarrhea  or change in bowel or urinary habits, change in stools or urine, dysuria,hematuria,  rash, arthralgias, visual complaints, headache, numbness weakness or ataxia or problems with walking or coordination,  change in mood/affect or memory.                          Objective:   Physical Exam  amb wf nad  11/27/2013     237 >   01/08/2014  236 > 01/21/2014 241  Wt Readings from Last 3 Encounters:  10/23/13 241 lb (109.317 kg)  10/08/13 247 lb 3.2 oz (112.129 kg)      HEENT: nl dentition, turbinates, and orophanx. Nl external ear canals without cough reflex   NECK :  without JVD/Nodes/TM/ nl carotid upstrokes bilaterally   LUNGS: no acc muscle use, clear to A and P bilaterally without cough on insp  or exp maneuvers   CV:  RRR  no s3 or murmur or increase in P2, no edema   ABD:  soft and nontender with nl excursion in the supine position. No bruits or organomegaly, bowel sounds nl  MS:  warm without deformities, calf tenderness, cyanosis or clubbing  SKIN: warm and dry without  lesions     CTa 01/08/2014  Bilateral effusions, non specific gg changes   Recent Labs Lab 01/08/14 1022  NA 144  K 4.5  CL 107  CO2 28  BUN 16  CREATININE 0.7  GLUCOSE 118*    Recent Labs Lab 01/08/14 1022  HGB 13.4  HCT 40.6  WBC 11.8*  PLT 295.0     Lab Results  Component Value Date   TSH 1.68 10/08/2013     Lab Results  Component Value Date   PROBNP 435.0* 01/08/2014     Lab Results  Component Value Date   ESRSEDRATE 35* 01/08/2014              Assessment:

## 2014-01-21 NOTE — Assessment & Plan Note (Signed)
Adequate control on present rx, reviewed > no change in rx needed  > key if she has sign MR in setting of EF 45 % to keep bp down

## 2014-01-21 NOTE — Patient Instructions (Signed)
No change in medications for the next month   Gradually increase your activity to improve your conditioning and calorie balance but never to where you are out of breath

## 2014-01-21 NOTE — Assessment & Plan Note (Addendum)
No needs to repeat ct since feeling so much better and no desats walking but suspect this was alv edema or bleeding from mild chf, not a primary lung problem at all> will see back prior to planned vacation in Jan 2016, sooner prn    Each maintenance medication was reviewed in detail including most importantly the difference between maintenance and as needed and under what circumstances the prns are to be used.  Please see instructions for details which were reviewed in writing and the patient given a copy.

## 2014-01-21 NOTE — Assessment & Plan Note (Signed)
-   11/27/2013  PFTs   FEV1  1.51(54%) ratio 52 and and no better p B2 and DLCO  71 - 01/08/2014  p extensive coaching HFA effectiveness =    90% > rec resume symbicort 160 2bid   Adequate control on present rx, reviewed > no change in rx needed  : symbicoort 160 2bid

## 2014-01-21 NOTE — Assessment & Plan Note (Signed)
-   10/08/2013  Walked RA x 2 laps @ 185 ft each stopped due to  Sob and back pain, no desat  - 01/08/2014   Walked RA x one lap @ 185 stopped due to  Sob with desat 89%  - CTa chest 01/08/2014 > new bilateral effusions/ non specific gg changes  - Echo 01/08/2014 > ef 45% with mild MR - 01/21/2014   Walked RA x one lap @ 185 stopped due to  Back gave out, slow pace, no desat   Multifactorial A) COPD/ optimized rx B) wt gain p stopped smoking, reviewed C) Prob element of valvular ht dz aggravated previously by hbp > much better, not seen by cards yet  rec no change in Rx, f/u cards at discretion of Dr Sedalia Muta

## 2014-02-20 ENCOUNTER — Telehealth: Payer: Self-pay | Admitting: Internal Medicine

## 2014-02-20 MED ORDER — FLUTICASONE-SALMETEROL 115-21 MCG/ACT IN AERO: 2.0000 | INHALATION_SPRAY | Freq: Two times a day (BID) | RESPIRATORY_TRACT | Status: DC

## 2014-02-20 NOTE — Telephone Encounter (Signed)
rx sent. Pt is aware of the change. She has an appt set for next week and wants to keep this appt. Carron Curie, CMA

## 2014-02-20 NOTE — Telephone Encounter (Addendum)
Received a fax from [facility] for a PA for Symbicort. I went through [email or web address], Key # [id number]. Pt has to try and fail covered alternative Advair before med will be approved. I do not see where she has tried this medication. Please advise if this is an ok alternative for this pt?   Allergies  Allergen Reactions  . Demerol [Meperidine]     vomiting   PA phone # is [phone] [facility]

## 2014-02-20 NOTE — Telephone Encounter (Signed)
Ok for advair 115 2bid trial then ov w/ in 6 week Tammy or me to regroup

## 2014-02-21 ENCOUNTER — Ambulatory Visit (INDEPENDENT_AMBULATORY_CARE_PROVIDER_SITE_OTHER): Payer: BLUE CROSS/BLUE SHIELD | Admitting: Adult Health

## 2014-02-21 ENCOUNTER — Ambulatory Visit (INDEPENDENT_AMBULATORY_CARE_PROVIDER_SITE_OTHER)
Admission: RE | Admit: 2014-02-21 | Discharge: 2014-02-21 | Disposition: A | Discharge status: Home / Self Care | Payer: BLUE CROSS/BLUE SHIELD | Source: Ambulatory Visit | Attending: Adult Health | Admitting: Adult Health

## 2014-02-21 ENCOUNTER — Telehealth: Payer: Self-pay | Admitting: Internal Medicine

## 2014-02-21 ENCOUNTER — Encounter: Payer: Self-pay | Admitting: Adult Health

## 2014-02-21 VITALS — BP 128/68 | HR 97 | Temp 98.7°F | Ht 67.0 in | Wt 235.8 lb

## 2014-02-21 DIAGNOSIS — R042 Hemoptysis: Secondary | ICD-10-CM

## 2014-02-21 DIAGNOSIS — J189 Pneumonia, unspecified organism: Secondary | ICD-10-CM | POA: Diagnosis not present

## 2014-02-21 MED ORDER — LEVOFLOXACIN 750 MG PO TABS: 750.0000 mg | ORAL_TABLET | Freq: Every day | ORAL | Status: AC

## 2014-02-21 NOTE — Telephone Encounter (Signed)
Pt has been scheduled to see TP at 2:45pm. Advised per Shanda Bumps to have pt come in at 2:30pm to do a CXR. Order has been placed.

## 2014-02-21 NOTE — Progress Notes (Signed)
cxr also reviewed, agree with rx

## 2014-02-21 NOTE — Progress Notes (Signed)
Subjective:     Patient ID: Judy [name], female   DOB: Jan 22, 1961,  MRN: [id number]    Brief patient profile:  53 yowf quit smoking 2007  @  150 lb  with cough that resolved and no resp problems until winter 2015 with doe x walking the dog s much in terms of cough then 10/04/13 sudden sense she couldn't get a breath and coughed up blood x one tsp plus slt green mucus  > better with saba and started on inhalers/ abx  >  better and pred taper and dx of GOLD II copd was made 11/2013    History of Present Illness  10/08/2013 1st [facility] office visit/ Wert Chief Complaint  Patient presents with  . Pulmonary Consult    Referred by Penni Homans Cox-sob with exertion x 6-8 mths.,worse since 4 days ago,occass. cough-tsp. of blood 4 days ago,usually unprod.,no wheezing,midchest tightness,no fcs,Had neb. since yesterday(used 2x yesterday)   baseline = 14ft   to mailbox and sob when gets there.  rec Clonidine 0.1 mg twice daily until you return  Plan A = automatic = symbiocort 160 Take 2 puffs first thing in am and then another 2 puffs about 12 hours later.  Plan B = Only use your albuterol (proair)as a rescue medication    Plan C = nebulizer albuterol, ok to use up to every 4 hours if can't get relief from Plan B GERD diet           10/23/2013 f/u ov/Wert re: unexplained doe/better on symbicort not needing rescue saba Chief Complaint  Patient presents with  . Follow-up    [name],  but not back to baseline yet. Denies any cough. No new co's today and has not needed neb.   cough resolved / happy with clonidine for bp, back on asa s hemoptysis   rec Plan A = automatic = symbiocort 160 Take 2 puffs first thing in am and then another 2 puffs about 12 hours later.  Plan B = Only use your albuterol (proair) as needed    11/27/2013 f/u ov/Wert re: GOLD II copd  Chief Complaint  Patient presents with  . Follow-up    PFT done today. Pt states that her  breathing is slightly improved since the last visit. No new co's today. She has not used rescue inhaler, but has been using symbicort prn- 3 to 4 x per wk on average.   not using any saba at all    Not limited by breathing from desired activities   rec GOLD II copd is mild to moderate and unlikely to worsen unless you resume smoking Symbicort 160 Take 2 puffs first thing in am and then another 2 puffs about 12 hours later   01/08/2014 acute ov/Wert re:  uneplained sob/ cough  Chief Complaint  Patient presents with  . Follow-up    SOB cough productive red greenish mucus   better p last ov but then noticed recurrent  sob walking dogs despite symbicort 160 w/in a week or so of ov then then much worse with bad cough green/bloody mucus x 5 days rx with change to anoro no better so far rec Please see patient coordinator before you leave today  to schedule CTa of chest and Sinus CT asap and echo as well   Please remember to go to the lab  department downstairs for your tests - we will call you with the results when they are available.  Start back on symbicort 160 Take 2 puffs first thing in am and then another 2 puffs about 12 hours later.   Try prilosec   Take 30-60 min before first meal of the day and Pepcid 20 mg one bedtime until return  Augmentin 875 mg take one pill twice daily  X 10 days - take at breakfast and supper with large glass of water.  It would help reduce the usual side effects (diarrhea and yeast infections) if you ate cultured yogurt at lunch.   No aspirin with any bleeding - hold until no blood x 3 straight days  Please schedule a follow up office visit in 2 weeks, sooner if needed with all active meds in hand Late add Prednisone 10 mg take  4 each am x 2 days,   2 each am x 2 days,  1 each am x 2 days and stop    01/21/2014 f/u ov/Wert re: GOLD II copd/ MR Chief Complaint  Patient presents with  . Follow-up    Pt states that her breathing has improved since the  last visit. She is using proair about 3 x per wk. No new co's today.   Not limited by breathing from desired activities  But very sedentary   >no change   02/21/2014 Acute OV  Patient presents for an acute office visit for an acute office visit. She complains over the last 2 days that she has had increased cough, congestion with thick green mucus, and this morning started to see some blood mixed into her mucus. She denies any frank hemoptysis.. She denies any fever, chest pain, orthopnea, PND, or increased leg swelling Appetite is good with no nausea, vomiting or diarrhea Patient is planning to leave for a trip to [place] for a family wedding next week. Chest x-ray today shows a right lower lobe airspace disease. Patient has chronic back pain from scoliosis and previous surgery She is on chronic narcotics with Opana and Fioricet as needed. She does use gabapentin 400 mg 2-3 times a day. She denies any dysphagia, heartburn. CT chest in 01/08/2014 showed persistent groundglass infiltrates along the lower lobes and right middle and upper lobe. CT sinus without any acute disease CBC in November showed normal eosinophil count. Previous sedimentation rate 35.  Current Medications, Allergies, Complete Past Medical History, Past Surgical History, Family History, and Social History were reviewed in [employer] record.     ROS  The following are not active complaints unless bolded sore throat, dysphagia, dental problems, itching, sneezing,  nasal congestion or excess/ purulent secretions, ear ache,   fever, chills, sweats, unintended wt loss, pleuritic or exertional cp, hemoptysis,  orthopnea pnd or leg swelling, presyncope, palpitations, heartburn, abdominal pain, anorexia, nausea, vomiting, diarrhea  or change in bowel or urinary habits, change in stools or urine, dysuria,hematuria,  rash, arthralgias, visual complaints, headache, numbness weakness or ataxia or problems with  walking or coordination,  change in mood/affect or memory.                          Objective:   Physical Exam  amb wf nad  11/27/2013     237 >   01/08/2014  236 > 01/21/2014 241 >235 02/21/2014              HEENT: nl dentition, turbinates, and orophanx. Nl external ear canals without cough reflex   NECK :  without JVD/Nodes/TM/ nl carotid upstrokes bilaterally  LUNGS: no acc muscle use, clear to A and P bilaterally without cough on insp or exp maneuvers   CV:  RRR  no s3 or murmur or increase in P2, no edema   ABD:  soft and nontender with nl excursion in the supine position. No bruits or organomegaly, bowel sounds nl  MS:  warm without deformities, calf tenderness, cyanosis or clubbing  SKIN: warm and dry without lesions     CTa 01/08/2014  Bilateral effusions, non specific gg changes   Recent Labs Lab 01/08/14 1022  NA 144  K 4.5  CL 107  CO2 28  BUN 16  CREATININE 0.7  GLUCOSE 118*    Recent Labs Lab 01/08/14 1022  HGB 13.4  HCT 40.6  WBC 11.8*  PLT 295.0     Lab Results  Component Value Date   TSH 1.68 10/08/2013     Lab Results  Component Value Date   PROBNP 435.0* 01/08/2014     Lab Results  Component Value Date   ESRSEDRATE 35* 01/08/2014       CXR 02/21/2014 hazy right lower lobe airspace disease       Assessment:

## 2014-02-21 NOTE — Progress Notes (Signed)
Chart and office notes reviewed and agree with a/p as outline

## 2014-02-21 NOTE — Patient Instructions (Signed)
Levaquin 750mg  daily for 7 days  Mucinex DM Twice daily  As needed  Cough/congestion  Fluids and rest  No eating 3 hr before bedtime.  Follow up next week with chest xray and As needed   Please contact office for sooner follow up if symptoms do not improve or worsen or seek emergency care

## 2014-02-21 NOTE — Assessment & Plan Note (Addendum)
RLL PNA  ? Aspiration vs reoccurence /persistent RLL vs BOOP (elevated ESR)  She is on daily narcotics , wonder if this could be contributing to possible aspiration along w/ underlying  OSA (snoring /daytime sleepiness)  wlll need follow up cxr   Plan  Levaquin 763m daily for 7 days  Mucinex DM Twice daily  As needed  Cough/congestion  Fluids and rest  No eating 3 hr before bedtime.  Follow up next week with chest xray and As needed   Please contact office for sooner follow up if symptoms do not improve or worsen or seek emergency care

## 2014-02-26 ENCOUNTER — Ambulatory Visit (INDEPENDENT_AMBULATORY_CARE_PROVIDER_SITE_OTHER): Payer: BLUE CROSS/BLUE SHIELD | Admitting: Internal Medicine

## 2014-02-26 ENCOUNTER — Ambulatory Visit (INDEPENDENT_AMBULATORY_CARE_PROVIDER_SITE_OTHER)
Admission: RE | Admit: 2014-02-26 | Discharge: 2014-02-26 | Disposition: A | Discharge status: Home / Self Care | Payer: BLUE CROSS/BLUE SHIELD | Source: Ambulatory Visit | Attending: Internal Medicine | Admitting: Internal Medicine

## 2014-02-26 ENCOUNTER — Encounter: Payer: Self-pay | Admitting: Internal Medicine

## 2014-02-26 VITALS — BP 126/80 | HR 94 | Ht 67.0 in | Wt 232.0 lb

## 2014-02-26 DIAGNOSIS — J189 Pneumonia, unspecified organism: Secondary | ICD-10-CM

## 2014-02-26 DIAGNOSIS — J449 Chronic obstructive pulmonary disease, unspecified: Secondary | ICD-10-CM

## 2014-02-26 MED ORDER — PREDNISONE 10 MG PO TABS: ORAL_TABLET | ORAL | Status: DC

## 2014-02-26 NOTE — Patient Instructions (Signed)
If cough/ wheeze / short of breath worsen : Prednisone 10 mg take  4 each am x 2 days,   2 each am x 2 days,  1 each am x 2 days and stop   Call with formulary alternatives to symbicort 160 2 every 12 hours (dulera should be one option)  Please schedule a follow up visit in 3 months but call sooner if needed

## 2014-02-26 NOTE — Progress Notes (Signed)
Subjective:     Patient ID: Judy [name], female   DOB: [birth year],  MRN: [id number]    Brief patient profile:  53 yowf quit smoking 2007  @  150 lb  with cough that resolved and no resp problems until winter 2015 with doe x walking the dog s much in terms of cough then 10/04/13 sudden sense she couldn't get a breath and coughed up blood x one tsp plus slt green mucus  > better with saba and started on inhalers/ abx  >  better and pred taper and dx of GOLD II copd was made 11/2013    History of Present Illness  10/08/2013 1st  Pulmonary office visit/ Wert Chief Complaint  Patient presents with  . Pulmonary Consult    Referred by [facility] Hom[facility] Cox-sob with exertion x 6-8 mths.,worse since 4 days ago,occass. cough-tsp. of blood 4 days ago,usually unprod.,no wheezing,midchest tightness,no fcs,Had neb. since yesterday(used 2x yesterday)   baseline = 120ft   to mailbox and sob when gets there.  rec Clonidine 0.1 mg twice daily until you return  Plan A = automatic = symbiocort 160 Take 2 puffs first thing in am and then another 2 puffs about 12 hours later.  Plan B = Only use your albuterol (proair)as a rescue medication    Plan C = nebulizer albuterol, ok to use up to every 4 hours if can't get relief from Plan B GERD diet           10/23/2013 f/u ov/Wert re: unexplained doe/better on symbicort not needing rescue saba Chief Complaint  Patient presents with  . Follow-up    [name],  but not back to baseline yet. Denies any cough. No new co's today and has not needed neb.   cough resolved / happy with clonidine for bp, back on asa s hemoptysis   rec Plan A = automatic = symbiocort 160 Take 2 puffs first thing in am and then another 2 puffs about 12 hours later.  Plan B = Only use your albuterol (proair) as needed    11/27/2013 f/u ov/Wert re: GOLD II copd  Chief Complaint  Patient presents with  . Follow-up    PFT done today. Pt states that her  breathing is slightly improved since the last visit. No new co's today. She has not used rescue inhaler, but has been using symbicort prn- 3 to 4 x per wk on average.   not using any saba at all    Not limited by breathing from desired activities   rec GOLD II copd is mild to moderate and unlikely to worsen unless you resume smoking Symbicort 160 Take 2 puffs first thing in am and then another 2 puffs about 12 hours later   01/08/2014 acute ov/Wert re:  uneplained sob/ cough  Chief Complaint  Patient presents with  . Follow-up    SOB cough productive red greenish mucus   better p last ov but then noticed recurrent  sob walking dogs despite symbicort 160 w/in a week or so of ov then then much worse with bad cough green/bloody mucus x 5 days rx with change to anoro no better so far rec Please see patient coordinator before you leave today  to schedule CTa of chest and Sinus CT asap and echo as well   Please remember to go to the lab  department downstairs for your tests - we will call you with the results when they are available.  Start back on symbicort 160 Take 2 puffs first thing in am and then another 2 puffs about 12 hours later.   Try prilosec   Take 30-60 min before first meal of the day and Pepcid 20 mg one bedtime until return  Augmentin 875 mg take one pill twice daily  X 10 days - take at breakfast and supper with large glass of water.  It would help reduce the usual side effects (diarrhea and yeast infections) if you ate cultured yogurt at lunch.   No aspirin with any bleeding - hold until no blood x 3 straight days  Please schedule a follow up office visit in 2 weeks, sooner if needed with all active meds in hand Late add Prednisone 10 mg take  4 each am x 2 days,   2 each am x 2 days,  1 each am x 2 days and stop    01/21/2014 f/u ov/Wert re: GOLD II copd/ MR Chief Complaint  Patient presents with  . Follow-up    Pt states that her breathing has improved since the  last visit. She is using proair about 3 x per wk. No new co's today.   Not limited by breathing from desired activities  But very sedentary   >no change   02/21/2014 Acute OV  Patient presents for an acute office visit for an acute office visit. She complains over the last 2 days that she has had increased cough, congestion with thick green mucus, and this morning started to see some blood mixed into her mucus. She denies any frank hemoptysis.. She denies any fever, chest pain, orthopnea, PND, or increased leg swelling Appetite is good with no nausea, vomiting or diarrhea Patient is planning to leave for a trip to [place] for a family wedding next week. Chest x-ray today shows a right lower lobe airspace disease. Patient has chronic back pain from scoliosis and previous surgery She is on chronic narcotics with Opana and Fioricet as needed. She does use gabapentin 400 mg 2-3 times a day. She denies any dysphagia, heartburn. CT chest in 01/08/2014 showed persistent groundglass infiltrates along the lower lobes and right middle and upper lobe. CT sinus without any acute disease CBC in November showed normal eosinophil count. Previous sedimentation rate 35. rec Levaquin  daily for 7 days  Mucinex DM Twice daily  As needed  Cough/congestion  Fluids and rest  No eating 3 hr before bedtime.  Follow up next week with chest xray and As needed     02/26/2014 f/u ov/Wert re: f/ u ? pna inpt with copd GOLD II maint on symbicort 160 2bid  Chief Complaint  Patient presents with  . Follow-up    cxr done today. Pt states that her breathing is doing some better. Her cough is some better- still producing some sputum- clear and frothy.   No more green or bloody sputum Still a little sob and slt prod cough  Esp in am but nothing purulent or bloody  No need for any rescue rx    No obvious day to day or daytime variabilty or assoc chronic cough or cp or chest tightness, subjective wheeze overt sinus or  hb symptoms. No unusual exp hx or h/o childhood pna/ asthma or knowledge of premature birth.  Sleeping ok without nocturnal  or early am exacerbation  of respiratory  c/o's or need for noct saba. Also denies any obvious fluctuation of symptoms with weather or environmental changes or other aggravating or alleviating factors  except as outlined above   Current Medications, Allergies, Complete Past Medical History, Past Surgical History, Family History, and Social History were reviewed in [employer] record.  ROS  The following are not active complaints unless bolded sore throat, dysphagia, dental problems, itching, sneezing,  nasal congestion or excess/ purulent secretions, ear ache,   fever, chills, sweats, unintended wt loss, pleuritic or exertional cp, hemoptysis,  orthopnea pnd or leg swelling, presyncope, palpitations, heartburn, abdominal pain, anorexia, nausea, vomiting, diarrhea  or change in bowel or urinary habits, change in stools or urine, dysuria,hematuria,  rash, arthralgias, visual complaints, headache, numbness weakness or ataxia or problems with walking or coordination,  change in mood/affect or memory.            Objective:   Physical Exam  amb wf nad  11/27/2013     237 >   01/08/2014  236 > 01/21/2014 241 >235 02/21/2014 > 02/26/2014  232   HEENT: nl dentition, turbinates, and orophanx. Nl external ear canals without cough reflex   NECK :  without JVD/Nodes/TM/ nl carotid upstrokes bilaterally   LUNGS: no acc muscle use, clear to A and P bilaterally without cough on insp or exp maneuvers   CV:  RRR  no s3 or murmur or increase in P2, no edema   ABD:  soft and nontender with nl excursion in the supine position. No bruits or organomegaly, bowel sounds nl  MS:  warm without deformities, calf tenderness, cyanosis or clubbing  SKIN: warm and dry without lesions         Recent Labs Lab 01/08/14 1022  NA 144  K 4.5  CL 107  CO2 28  BUN 16   CREATININE 0.7  GLUCOSE 118*    Recent Labs Lab 01/08/14 1022  HGB 13.4  HCT 40.6  WBC 11.8*  PLT 295.0     Lab Results  Component Value Date   TSH 1.68 10/08/2013     Lab Results  Component Value Date   PROBNP 435.0* 01/08/2014     Lab Results  Component Value Date   ESRSEDRATE 35* 01/08/2014        CXR PA and Lateral:   02/26/2014 :     I personally reviewed images and agree with radiology impression as follows:   No pneumonia is seen. Minimal linear atelectasis or scarring is noted in the region of the lingula. Mild cardiomegaly is stable. A Harrington rod overlies the thoracic spine.       Assessment:

## 2014-03-03 ENCOUNTER — Encounter: Payer: Self-pay | Admitting: Internal Medicine

## 2014-03-03 NOTE — Assessment & Plan Note (Addendum)
-   11/27/2013  PFTs   FEV1  1.51(54%) ratio 52 and and no better p B2 and DLCO  71 - 01/08/2014  p extensive coaching HFA effectiveness =    90% > rec resume symbicort 160 2bid   Moderate severity/ Adequate control on present rx, reviewed > no change in rx needed  > reviewed formulary issues with her

## 2014-03-03 NOTE — Assessment & Plan Note (Signed)
resolved 

## 2014-03-07 ENCOUNTER — Telehealth: Payer: Self-pay | Admitting: Internal Medicine

## 2014-03-07 NOTE — Telephone Encounter (Signed)
Coughing up blood today - 1/4 cup. Chart reviewed - not sure cause. Last abx couse was 02/21/14 for 7 days. Ws in [place] recently but fine there.  rec Please take prednisone 40 mg x1 day, then 30 mg x1 day, then 20 mg x1 day, then 10 mg x1 day, and then 5 mg x1 day and stop If worsens go to ER    Dr. Kalman Shan, M.D., [facility].C.P Pulmonary and Critical Care Medicine Staff Physician [facility] Pager: [phone], If no answer or between  15:00h - 7:00h: call [phone]  03/07/2014 12:11 PM

## 2014-03-10 ENCOUNTER — Telehealth: Payer: Self-pay | Admitting: *Deleted

## 2014-03-10 DIAGNOSIS — R042 Hemoptysis: Secondary | ICD-10-CM

## 2014-03-10 NOTE — Telephone Encounter (Signed)
Spoke with the pt  She states that she just had ct chest and sinus done in Nov 2015  She is asking why this needs to be repeated again  Please advise thanks

## 2014-03-10 NOTE — Telephone Encounter (Signed)
Spoke with pt to explain the difference between the ct now and in November.  She is ok with this.  Orders placed.  Nothing further needed.

## 2014-03-10 NOTE — Telephone Encounter (Signed)
This is different, it's a non-contrasted HRCT and sinuses done same day to work out why she keeps coughing up blood

## 2014-03-10 NOTE — Telephone Encounter (Signed)
-----   Message from Nyoka Cowden, MD sent at 03/07/2014 12:54 PM EST ----- recurrent hemoptysis so  rec  HRCT chest, sinus ct limied both done same day here on [address], not [employer]

## 2014-03-13 ENCOUNTER — Other Ambulatory Visit: Payer: Medicare Other

## 2014-03-13 ENCOUNTER — Ambulatory Visit
Admission: RE | Admit: 2014-03-13 | Discharge: 2014-03-13 | Disposition: A | Discharge status: Home / Self Care | Payer: BLUE CROSS/BLUE SHIELD | Source: Ambulatory Visit | Attending: Internal Medicine | Admitting: Internal Medicine

## 2014-03-13 ENCOUNTER — Ambulatory Visit (INDEPENDENT_AMBULATORY_CARE_PROVIDER_SITE_OTHER)
Admission: RE | Admit: 2014-03-13 | Discharge: 2014-03-13 | Disposition: A | Discharge status: Home / Self Care | Payer: BLUE CROSS/BLUE SHIELD | Source: Ambulatory Visit | Attending: Internal Medicine | Admitting: Internal Medicine

## 2014-03-13 DIAGNOSIS — R042 Hemoptysis: Secondary | ICD-10-CM | POA: Diagnosis not present

## 2014-03-13 NOTE — Progress Notes (Signed)
Quick Note:  LMTCB ______ 

## 2014-03-14 ENCOUNTER — Telehealth: Payer: Self-pay | Admitting: Internal Medicine

## 2014-03-14 NOTE — Telephone Encounter (Signed)
Notes Recorded by Nyoka Cowden, MD on 03/13/2014 at 5:46 PM Call patient : Study is remarkably improved, no additonal studies needed but def f/u as planned or in 2 weeks to regroup, whichever comes first     Pt aware of results.  Scheduled appt for 2/11 at 10:30.  Nothing further needed.

## 2014-03-14 NOTE — Progress Notes (Signed)
Quick Note:  Spoke with pt and notified of results per Dr. Wert. Pt verbalized understanding and denied any questions.  ______ 

## 2014-03-27 ENCOUNTER — Encounter: Payer: Self-pay | Admitting: Internal Medicine

## 2014-03-27 ENCOUNTER — Ambulatory Visit (INDEPENDENT_AMBULATORY_CARE_PROVIDER_SITE_OTHER): Payer: BLUE CROSS/BLUE SHIELD | Admitting: Internal Medicine

## 2014-03-27 VITALS — BP 106/68 | HR 94 | Temp 97.2°F | Ht 65.0 in | Wt 236.0 lb

## 2014-03-27 DIAGNOSIS — R05 Cough: Secondary | ICD-10-CM

## 2014-03-27 DIAGNOSIS — J449 Chronic obstructive pulmonary disease, unspecified: Secondary | ICD-10-CM

## 2014-03-27 DIAGNOSIS — R059 Cough, unspecified: Secondary | ICD-10-CM

## 2014-03-27 DIAGNOSIS — R0600 Dyspnea, unspecified: Secondary | ICD-10-CM

## 2014-03-27 DIAGNOSIS — R918 Other nonspecific abnormal finding of lung field: Secondary | ICD-10-CM

## 2014-03-27 MED ORDER — ACLIDINIUM BROMIDE 400 MCG/ACT IN AEPB: 1.0000 | INHALATION_SPRAY | Freq: Two times a day (BID) | RESPIRATORY_TRACT | Status: DC

## 2014-03-27 MED ORDER — MOMETASONE FURO-FORMOTEROL FUM 200-5 MCG/ACT IN AERO: INHALATION_SPRAY | RESPIRATORY_TRACT | Status: DC

## 2014-03-27 NOTE — Patient Instructions (Addendum)
Need a different pneumonia shot come September 2016   Dulera 200 Take 2 puffs first thing in am and then another 2 puffs about 12 hours later.   Chase with Caprice Renshaw one twice daily   Need 02 sats over 90% when exercise   Prilosec 20 mg Take 30-60 min before first meal of the day   See Tammy NP w/in 2 weeks with all your medications, even over the counter meds, separated in two separate bags, the ones you take no matter what vs the ones you stop once you feel better and take only as needed when you feel you need them.   Tammy  will generate for you a new user friendly medication calendar that will put [place] all on the same page re: your medication use.     Without this process, it simply isn't possible to assure that we are providing  your outpatient care  with  the attention to detail we feel you deserve.   If we cannot assure that you're getting that kind of care,  then we cannot manage your problem effectively from this clinic.  Once you have seen Tammy and we are sure that we're all on the same page with your medication use she will arrange follow up with me.  Add:  Count the inhalers as should be near zero/ repeat her walking sats - next flare check bnp/esr ratio ? Needs cards eval for decreased ef

## 2014-03-27 NOTE — Progress Notes (Signed)
Subjective:     Patient ID: Judy [name], female   DOB: Jan 22, 1961,  MRN: [id number]    Brief patient profile:  53 yowf quit smoking 2007  @  150 lb  with cough that resolved and no resp problems until winter 2015 with doe x walking the dog s much in terms of cough then 10/04/13 sudden sense she couldn't get a breath and coughed up blood x one tsp plus slt green mucus  > better with saba and started on inhalers/ abx  >  better and pred taper and dx of GOLD II copd was made 11/2013    History of Present Illness  10/08/2013 1st [facility] office visit/ Judy [name] Chief Complaint  Patient presents with  . Pulmonary Consult    Referred by Penn[facility] Homans Cox-sob with exertion x 6-8 mths.,worse since 4 days ago,occass. cough-tsp. of blood 4 days ago,usually unprod.,no wheezing,midchest tightness,no fcs,Had neb. since yesterday(used 2x yesterday)   baseline = 14ft   to mailbox and sob when gets there.  rec Clonidine 0.1 mg twice daily until you return  Plan A = automatic = symbiocort 160 Take 2 puffs first thing in am and then another 2 puffs about 12 hours later.  Plan B = Only use your albuterol (proair)as a rescue medication    Plan C = nebulizer albuterol, ok to use up to every 4 hours if can't get relief from Plan B GERD diet           10/23/2013 f/u ov/Judy [name] re: unexplained doe/better on symbicort not needing rescue saba Chief Complaint  Patient presents with  . Follow-up    [name],  but not back to baseline yet. Denies any cough. No new co's today and has not needed neb.   cough resolved / happy with clonidine for bp, back on asa s hemoptysis   rec Plan A = automatic = symbiocort 160 Take 2 puffs first thing in am and then another 2 puffs about 12 hours later.  Plan B = Only use your albuterol (proair) as needed    11/27/2013 f/u ov/Judy [name] re: GOLD II copd  Chief Complaint  Patient presents with  . Follow-up    PFT done today. Pt states that her  breathing is slightly improved since the last visit. No new co's today. She has not used rescue inhaler, but has been using symbicort prn- 3 to 4 x per wk on average.   not using any saba at all    Not limited by breathing from desired activities   rec GOLD II copd is mild to moderate and unlikely to worsen unless you resume smoking Symbicort 160 Take 2 puffs first thing in am and then another 2 puffs about 12 hours later   01/08/2014 acute ov/Judy [name] re:  uneplained sob/ cough  Chief Complaint  Patient presents with  . Follow-up    SOB cough productive red greenish mucus   better p last ov but then noticed recurrent  sob walking dogs despite symbicort 160 w/in a week or so of ov then then much worse with bad cough green/bloody mucus x 5 days rx with change to anoro no better so far rec Please see patient coordinator before you leave today  to schedule CTa of chest and Sinus CT asap and echo as well   Please remember to go to the lab  department downstairs for your tests - we will call you with the results when they are available.  Start back on symbicort 160 Take 2 puffs first thing in am and then another 2 puffs about 12 hours later.   Try prilosec   Take 30-60 min before first meal of the day and Pepcid 20 mg one bedtime until return  Augmentin 875 mg take one pill twice daily  X 10 days - take at breakfast and supper with large glass of water.  It would help reduce the usual side effects (diarrhea and yeast infections) if you ate cultured yogurt at lunch.   No aspirin with any bleeding - hold until no blood x 3 straight days  Please schedule a follow up office visit in 2 weeks, sooner if needed with all active meds in hand Late add Prednisone 10 mg take  4 each am x 2 days,   2 each am x 2 days,  1 each am x 2 days and stop    01/21/2014 f/u ov/Judy [name] re: GOLD II copd/ MR Chief Complaint  Patient presents with  . Follow-up    Pt states that her breathing has improved since the  last visit. She is using proair about 3 x per wk. No new co's today.   Not limited by breathing from desired activities  But very sedentary   >no change   02/21/2014 Acute OV  Patient presents for an acute office visit for an acute office visit. She complains over the last 2 days that she has had increased cough, congestion with thick green mucus, and this morning started to see some blood mixed into her mucus. She denies any frank hemoptysis.. She denies any fever, chest pain, orthopnea, PND, or increased leg swelling Appetite is good with no nausea, vomiting or diarrhea Patient is planning to leave for a trip to [place] for a family wedding next week. Chest x-ray today shows a right lower lobe airspace disease. Patient has chronic back pain from scoliosis and previous surgery She is on chronic narcotics with Opana and Fioricet as needed. She does use gabapentin 400 mg 2-3 times a day. She denies any dysphagia, heartburn. CT chest in 01/08/2014 showed persistent groundglass infiltrates along the lower lobes and right middle and upper lobe. CT sinus without any acute disease CBC in November showed normal eosinophil count. Previous sedimentation rate 35. rec Levaquin  daily for 7 days  Mucinex DM Twice daily  As needed  Cough/congestion  Fluids and rest  No eating 3 hr before bedtime.     02/26/2014 f/u ov/Judy [name] re: f/ u ? pna inpt with copd GOLD II maint on symbicort 160 2bid  Chief Complaint  Patient presents with  . Follow-up    cxr done today. Pt states that her breathing is doing some better. Her cough is some better- still producing some sputum- clear and frothy.   No more green or bloody sputum Still a little sob and slt prod cough  Esp in am but nothing purulent or bloody  No need for any rescue rx  rec If cough/ wheeze / short of breath worsen : Prednisone 10 mg take  4 each am x 2 days,   2 each am x 2 days,  1 each am x 2 days and stop  Call with formulary alternatives to  symbicort 160 2 every 12 hours (dulera should be one option)   - 03/07/2014 recurrent hemoptysis rec  HRCT chest 03/13/2014 > Significant improvement in the appearance of the lung parenchyma compared to the prior examination ? Boop?    03/27/2014 f/u ov/Levie [name]  re: GOLD II copd with superimposed AS dz ? alv hem ? Boop/resolved  Chief Complaint  Patient presents with  . Follow-up    Pt states woke up this am with increased SOB and prod cough with green sputum.    doing rehab completely flat x stops p 5 min to let 02 recover Not consistent with resp meds / insurance formulary restrictions not clear  Not using saba prn in any form.    No obvious day to day or daytime variabilty or assoc   cp or chest tightness, subjective wheeze overt sinus or hb symptoms. No unusual exp hx or h/o childhood pna/ asthma or knowledge of premature birth.  Sleeping ok without nocturnal  or early am exacerbation  of respiratory  c/o's or need for noct saba. Also denies any obvious fluctuation of symptoms with weather or environmental changes or other aggravating or alleviating factors except as outlined above   Current Medications, Allergies, Complete Past Medical History, Past Surgical History, Family History, and Social History were reviewed in [employer] record.  ROS  The following are not active complaints unless bolded sore throat, dysphagia, dental problems, itching, sneezing,  nasal congestion or excess/ purulent secretions, ear ache,   fever, chills, sweats, unintended wt loss, pleuritic or exertional cp, hemoptysis,  orthopnea pnd or leg swelling, presyncope, palpitations, heartburn, abdominal pain, anorexia, nausea, vomiting, diarrhea  or change in bowel or urinary habits, change in stools or urine, dysuria,hematuria,  rash, arthralgias, visual complaints, headache, numbness weakness or ataxia or problems with walking or coordination,  change in mood/affect or memory.        I  personally reviewed images and agree with radiology impression as follows:  HRCT chest 03/13/2014 > Significant improvement in the appearance of the lung parenchyma compared to the prior examination ? Boop?        Objective:   Physical Exam  amb wf nad  11/27/2013     237 >   01/08/2014  236 > 01/21/2014 241 >235 02/21/2014 > 02/26/2014  232 > 03/27/2014  236    HEENT: nl dentition, turbinates, and orophanx. Nl external ear canals without cough reflex   NECK :  without JVD/Nodes/TM/ nl carotid upstrokes bilaterally   LUNGS: no acc muscle use, clear to A and P bilaterally without cough on insp or exp maneuvers   CV:  RRR  no s3 or murmur or increase in P2, no edema   ABD:  soft and nontender with nl excursion in the supine position. No bruits or organomegaly, bowel sounds nl  MS:  warm without deformities, calf tenderness, cyanosis or clubbing  SKIN: warm and dry without lesions          Assessment:

## 2014-03-30 ENCOUNTER — Encounter: Payer: Self-pay | Admitting: Internal Medicine

## 2014-03-30 NOTE — Assessment & Plan Note (Addendum)
-   11/27/2013  PFTs   FEV1  1.51 (54%) ratio 52 and and no better p B2 and DLCO  71    03/27/14   p extensive coaching HFA effectiveness =    90% / 100% with dpi     I had an extended discussion with the patient reviewing all relevant studies completed to date and  lasting 15 to 20 minutes of a 25 minute visit on the following ongoing concerns:   1) Symptoms have been difficult to control historically and realized today she really doesn't understand how to take her meds    2) try dulera 200/tudorza  On a trial basis/ samples only until she proves she's taking them consistently and we see how she does with her treadmill  3) max acid suppression / diet reviewed  4) Trust but verify here:  To keep things simple, I have asked the patient to first separate medicines that are perceived as maintenance, that is to be taken daily "no matter what", from those medicines that are taken on only on an as-needed basis and I have given the patient examples of both, and then return to see our NP to generate a  detailed  medication calendar which should be followed until the next physician sees the patient and updates it.

## 2014-03-30 NOTE — Assessment & Plan Note (Signed)
-   sinus Ct 01/08/2014 > No evidence of chronic sinusitis - sinus CT 03/13/2014 > neg   No clear source for reported green sputum, note absence of bronchiectasis > rx now as CB with bronchodilators only

## 2014-03-30 NOTE — Assessment & Plan Note (Signed)
-   10/08/2013  Walked RA x 2 laps @ 185 ft each stopped due to  Sob and back pain, no desat  - 01/08/2014   Walked RA x one lap @ 185 stopped due to  Sob with desat 89%  - CTa chest 01/08/2014 > new bilateral effusions/ non specific gg changes  - Echo 01/08/2014 > ef 45% with mild MR - 01/21/2014   Walked RA x one lap @ 185 stopped due to  Back gave out, slow pace, no desat   Describing desats at rehab > rec 02 for sagts < 90% and keep walking, need to verify that here on next ov and translate into an 02 rx if needed  Also consider cards eval as EF down.

## 2014-03-30 NOTE — Assessment & Plan Note (Signed)
See CT chest 10/07/13 ? Bronchopna/ blood/edema? - CTa 01/08/14 > New bilateral small pleural effusions. Persistent and increased infiltrative change particularly in the lower lobes bilaterally but also within the right middle and upper lobes. This is most prominent in the lower lobes bilaterally best seen on image number 71. This likely represents some waxing and waning atypical pneumonia. - 03/07/2014 recurrent hemoptysis rec  HRCT chest 03/13/2014 > Significant improvement in the appearance of the lung parenchyma compared to the prior examination ? Boop? But no evidence of bronchiectasis/ lung ca.  Still not clear what we're dealing with here ? Effects of hbp on diastolic dysfunciton ? boop (need to check bnp/esr ratio next flare)  For now this has improved> no further w/u

## 2014-04-10 ENCOUNTER — Other Ambulatory Visit (INDEPENDENT_AMBULATORY_CARE_PROVIDER_SITE_OTHER): Payer: BLUE CROSS/BLUE SHIELD

## 2014-04-10 ENCOUNTER — Ambulatory Visit (INDEPENDENT_AMBULATORY_CARE_PROVIDER_SITE_OTHER): Payer: BLUE CROSS/BLUE SHIELD | Admitting: Adult Health

## 2014-04-10 ENCOUNTER — Encounter: Payer: Self-pay | Admitting: Adult Health

## 2014-04-10 VITALS — BP 114/78 | HR 106 | Temp 98.0°F | Ht 65.0 in | Wt 236.0 lb

## 2014-04-10 DIAGNOSIS — R0600 Dyspnea, unspecified: Secondary | ICD-10-CM

## 2014-04-10 DIAGNOSIS — J449 Chronic obstructive pulmonary disease, unspecified: Secondary | ICD-10-CM | POA: Diagnosis not present

## 2014-04-10 DIAGNOSIS — J9611 Chronic respiratory failure with hypoxia: Secondary | ICD-10-CM | POA: Diagnosis not present

## 2014-04-10 DIAGNOSIS — R918 Other nonspecific abnormal finding of lung field: Secondary | ICD-10-CM

## 2014-04-10 LAB — BRAIN NATRIURETIC PEPTIDE: PRO B NATRI PEPTIDE: 498 pg/mL — AB (ref 0.0–100.0)

## 2014-04-10 LAB — SEDIMENTATION RATE: SED RATE: 36 mm/h — AB (ref 0–22)

## 2014-04-10 MED ORDER — ACLIDINIUM BROMIDE 400 MCG/ACT IN AEPB: 1.0000 | INHALATION_SPRAY | Freq: Two times a day (BID) | RESPIRATORY_TRACT | Status: DC

## 2014-04-10 MED ORDER — MOMETASONE FURO-FORMOTEROL FUM 200-5 MCG/ACT IN AERO: INHALATION_SPRAY | RESPIRATORY_TRACT | Status: DC

## 2014-04-10 NOTE — Patient Instructions (Signed)
Follow med calendar closely and bring to each visit Labs today We are setting, you up for a overnight oximetry test We are referring you to cardiology for abnormal echo Begin oxygen 2 L with activity. Follow with Dr. Sherene Sires in 6 weeks and as needed

## 2014-04-10 NOTE — Progress Notes (Signed)
Subjective:     Patient ID: Judy [name], female   DOB: Jan 22, 1961,  MRN: [id number]    Brief patient profile:  53 yowf quit smoking 2007  @  150 lb  with cough that resolved and no resp problems until winter 2015 with doe x walking the dog s much in terms of cough then 10/04/13 sudden sense she couldn't get a breath and coughed up blood x one tsp plus slt green mucus  > better with saba and started on inhalers/ abx  >  better and pred taper and dx of GOLD II copd was made 11/2013    History of Present Illness  10/08/2013 1st [facility] office visit/ [facility] Chief Complaint  Patient presents with  . Pulmonary Consult    Referred by [facility] Homans Cox-sob with exertion x 6-8 mths.,worse since 4 days ago,occass. cough-tsp. of blood 4 days ago,usually unprod.,no wheezing,midchest tightness,no fcs,Had neb. since yesterday(used 2x yesterday)   baseline = 14ft   to mailbox and sob when gets there.  rec Clonidine 0.1 mg twice daily until you return  Plan A = automatic = symbiocort 160 Take 2 puffs first thing in am and then another 2 puffs about 12 hours later.  Plan B = Only use your albuterol (proair)as a rescue medication    Plan C = nebulizer albuterol, ok to use up to every 4 hours if can't get relief from Plan B GERD diet           10/23/2013 f/u ov/Wert re: unexplained doe/better on symbicort not needing rescue saba Chief Complaint  Patient presents with  . Follow-up    [name],  but not back to baseline yet. Denies any cough. No new co's today and has not needed neb.   cough resolved / happy with clonidine for bp, back on asa s hemoptysis   rec Plan A = automatic = symbiocort 160 Take 2 puffs first thing in am and then another 2 puffs about 12 hours later.  Plan B = Only use your albuterol (proair) as needed    11/27/2013 f/u ov/Wert re: GOLD II copd  Chief Complaint  Patient presents with  . Follow-up    PFT done today. Pt states that her  breathing is slightly improved since the last visit. No new co's today. She has not used rescue inhaler, but has been using symbicort prn- 3 to 4 x per wk on average.   not using any saba at all    Not limited by breathing from desired activities   rec GOLD II copd is mild to moderate and unlikely to worsen unless you resume smoking Symbicort 160 Take 2 puffs first thing in am and then another 2 puffs about 12 hours later   01/08/2014 acute ov/Wert re:  uneplained sob/ cough  Chief Complaint  Patient presents with  . Follow-up    SOB cough productive red greenish mucus   better p last ov but then noticed recurrent  sob walking dogs despite symbicort 160 w/in a week or so of ov then then much worse with bad cough green/bloody mucus x 5 days rx with change to anoro no better so far rec Please see patient coordinator before you leave today  to schedule CTa of chest and Sinus CT asap and echo as well   Please remember to go to the lab  department downstairs for your tests - we will call you with the results when they are available.  Start back on symbicort 160 Take 2 puffs first thing in am and then another 2 puffs about 12 hours later.   Try prilosec   Take 30-60 min before first meal of the day and Pepcid 20 mg one bedtime until return  Augmentin 875 mg take one pill twice daily  X 10 days - take at breakfast and supper with large glass of water.  It would help reduce the usual side effects (diarrhea and yeast infections) if you ate cultured yogurt at lunch.   No aspirin with any bleeding - hold until no blood x 3 straight days  Please schedule a follow up office visit in 2 weeks, sooner if needed with all active meds in hand Late add Prednisone 10 mg take  4 each am x 2 days,   2 each am x 2 days,  1 each am x 2 days and stop    01/21/2014 f/u ov/Wert re: GOLD II copd/ MR Chief Complaint  Patient presents with  . Follow-up    Pt states that her breathing has improved since the  last visit. She is using proair about 3 x per wk. No new co's today.   Not limited by breathing from desired activities  But very sedentary   >no change   02/21/2014 Acute OV  Patient presents for an acute office visit for an acute office visit. She complains over the last 2 days that she has had increased cough, congestion with thick green mucus, and this morning started to see some blood mixed into her mucus. She denies any frank hemoptysis.. She denies any fever, chest pain, orthopnea, PND, or increased leg swelling Appetite is good with no nausea, vomiting or diarrhea Patient is planning to leave for a trip to [place] for a family wedding next week. Chest x-ray today shows a right lower lobe airspace disease. Patient has chronic back pain from scoliosis and previous surgery She is on chronic narcotics with Opana and Fioricet as needed. She does use gabapentin 400 mg 2-3 times a day. She denies any dysphagia, heartburn. CT chest in 01/08/2014 showed persistent groundglass infiltrates along the lower lobes and right middle and upper lobe. CT sinus without any acute disease CBC in November showed normal eosinophil count. Previous sedimentation rate 35. rec Levaquin  daily for 7 days  Mucinex DM Twice daily  As needed  Cough/congestion  Fluids and rest  No eating 3 hr before bedtime.     02/26/2014 f/u ov/Wert re: f/ u ? pna inpt with copd GOLD II maint on symbicort 160 2bid  Chief Complaint  Patient presents with  . Follow-up    cxr done today. Pt states that her breathing is doing some better. Her cough is some better- still producing some sputum- clear and frothy.   No more green or bloody sputum Still a little sob and slt prod cough  Esp in am but nothing purulent or bloody  No need for any rescue rx  rec If cough/ wheeze / short of breath worsen : Prednisone 10 mg take  4 each am x 2 days,   2 each am x 2 days,  1 each am x 2 days and stop  Call with formulary alternatives to  symbicort 160 2 every 12 hours (dulera should be one option)   - 03/07/2014 recurrent hemoptysis rec  HRCT chest 03/13/2014 > Significant improvement in the appearance of the lung parenchyma compared to the prior examination ? Boop?    03/27/2014 f/u ov/Wert  re: GOLD II copd with superimposed AS dz ? alv hem ? Boop/resolved  Chief Complaint  Patient presents with  . Follow-up    Pt states woke up this am with increased SOB and prod cough with green sputum.    doing rehab completely flat x stops p 5 min to let 02 recover Not consistent with resp meds / insurance formulary restrictions not clear  Not using saba prn in any form. > Continue on [place] and Tudorza    04/10/2014 Follow up : GOLD II COPD w/ ? BOOP resolved Patient returns for a two-week follow-up and medication review We reviewed all her medications organize them into a medication count with patient education Appears that she is taking her medications correctly Patient has briefly been in pulmonary rehabilitation with notable drops in her oxygen level with exercise Today in the office, patient with desaturation to 88% with walking room air. Previous 2-D echo in November 2015 showed EF at 45-50%, with elevated BNP. Around 400 We discussed a cardiology referral for evaluation of possible underlying congestive heart failure. Patient does complain that she snores and has daytime sleepiness, we discussed checking her oxygen level while she was sleeping If positive explained that she may need a sleep test in the future. She denies any chest pain, palpitations, syncope, abdominal pain, vomiting, or increased leg swelling Patient had previous COPD exacerbation versus possible Boop with productive cough and hemoptysis. CT chest in 01/08/2014 showed persistent groundglass infiltrates along the lower lobes and right middle and upper lobe. Previous sedimentation rate 35. She was treated with steroids and antibiotics Symptoms improved  and repeat CT in January show significant improvement in aeration and decreased ground glass attenuation.   Current Medications, Allergies, Complete Past Medical History, Past Surgical History, Family History, and Social History were reviewed in [employer] record.  ROS  The following are not active complaints unless bolded sore throat, dysphagia, dental problems, itching, sneezing,  nasal congestion or excess/ purulent secretions, ear ache,   fever, chills, sweats, unintended wt loss, pleuritic or exertional cp, hemoptysis,  orthopnea pnd or leg swelling, presyncope, palpitations, heartburn, abdominal pain, anorexia, nausea, vomiting, diarrhea  or change in bowel or urinary habits, change in stools or urine, dysuria,hematuria,  rash, arthralgias, visual complaints, headache, numbness weakness or ataxia or problems with walking or coordination,  change in mood/affect or memory.        I personally reviewed images and agree with radiology impression as follows:  HRCT chest 03/13/2014 > Significant improvement in the appearance of the lung parenchyma compared to the prior examination ? Boop?        Objective:   Physical Exam  amb wf nad, morbidly obese   11/27/2013     237 >   01/08/2014  236 > 01/21/2014 241 >235 02/21/2014 > 02/26/2014  232 > 03/27/2014  236 >236 04/10/2014    HEENT: nl dentition, turbinates, and orophanx. Nl external ear canals without cough reflex   NECK :  without JVD/Nodes/TM/ nl carotid upstrokes bilaterally   LUNGS: no acc muscle use, clear to A and P bilaterally without cough on insp or exp maneuvers   CV:  RRR  no s3 or murmur or increase in P2, tr  edema   ABD:  soft and nontender with nl excursion in the supine position. No bruits or organomegaly, bowel sounds nl  MS:  warm without deformities, calf tenderness, cyanosis or clubbing  SKIN: warm and dry without lesions  Assessment:

## 2014-04-10 NOTE — Assessment & Plan Note (Signed)
Begin O2 at 2l/m with act  Check ono

## 2014-04-10 NOTE — Addendum Note (Signed)
Addended by: Boone Master E on: 04/10/2014 11:23 AM   Modules accepted: Orders

## 2014-04-10 NOTE — Assessment & Plan Note (Addendum)
EF decreased on prev echo w/ elevated BNP   Plan  Refer to cardiology  Check ONO  Check bnp

## 2014-04-10 NOTE — Assessment & Plan Note (Signed)
Improved clinically , much improved on CT last month  Recheck ESR today

## 2014-04-10 NOTE — Assessment & Plan Note (Addendum)
Compensated on present regimen Continue on Dulera and Tudorza Patient's medications were reviewed today and patient education was given. Computerized medication calendar was adjusted/completed

## 2014-04-10 NOTE — Progress Notes (Signed)
Chart reviewed, note reviewed agree with a/p

## 2014-04-11 ENCOUNTER — Telehealth: Payer: Self-pay | Admitting: Adult Health

## 2014-04-11 ENCOUNTER — Telehealth: Payer: Self-pay | Admitting: Internal Medicine

## 2014-04-11 NOTE — Telephone Encounter (Signed)
Patients' [employer] Rx was denied by insurance. An Alternative is Spiriva.  Insurance # [phone] Dr Sherene Sires, please advise and reply to triage. Thanks.

## 2014-04-11 NOTE — Telephone Encounter (Signed)
See if it covers Incruse and give enough samples of tudorza if not until returns to office as it's not easy to use spiriva

## 2014-04-11 NOTE — Telephone Encounter (Signed)
Form received, signed by TP and faxed back to [facility]

## 2014-04-11 NOTE — Progress Notes (Signed)
Quick Note:  Called spoke with patient, advised of lab results / recs as stated by TP. Pt verbalized her understanding and denied any questions.  Pt is aware we will call her w/ the A1AT results once available. ______

## 2014-04-11 NOTE — Telephone Encounter (Signed)
Form was received on triage fax. This has been given to [place]. Will route message to her.

## 2014-04-11 NOTE — Telephone Encounter (Signed)
Spoke with [employer]. Advised her that we do not have this form per Alida. She is going to refax it to triage. Will await form.

## 2014-04-14 ENCOUNTER — Telehealth: Payer: Self-pay | Admitting: Internal Medicine

## 2014-04-14 MED ORDER — UMECLIDINIUM BROMIDE 62.5 MCG/INH IN AEPB: 1.0000 | INHALATION_SPRAY | Freq: Every day | RESPIRATORY_TRACT | Status: DC

## 2014-04-14 MED ORDER — TIOTROPIUM BROMIDE MONOHYDRATE 18 MCG IN CAPS: 18.0000 ug | ORAL_CAPSULE | Freq: Every day | RESPIRATORY_TRACT | Status: DC

## 2014-04-14 NOTE — Telephone Encounter (Signed)
Sorry -  Nothing further needed; will sign off

## 2014-04-14 NOTE — Telephone Encounter (Signed)
Judy [name], is anything else needed with this? can we close this message? Thanks.

## 2014-04-14 NOTE — Telephone Encounter (Signed)
Received notice from [facility] stating that incruse is not covered  They prefer spiriva  Per MW okay to switch meds  I have spoke with the pt and notified her of this  She was okay with change  Rx was sent to [facility]

## 2014-04-14 NOTE — Telephone Encounter (Signed)
Pt is aware of medication change. We do not have samples of Tudorza. Pt will let us know if she has any issues with Incruse.

## 2014-04-15 LAB — ALPHA-1 ANTITRYPSIN PHENOTYPE: A-1 Antitrypsin: 190 mg/dL (ref 83–199)

## 2014-04-15 NOTE — Addendum Note (Signed)
Addended by: Boone Master E on: 04/15/2014 01:34 PM   Modules accepted: Orders, Medications

## 2014-04-25 ENCOUNTER — Emergency Department (HOSPITAL_COMMUNITY)
Admission: EM | Admit: 2014-04-25 | Discharge: 2014-04-25 | Disposition: A | Discharge status: Home / Self Care | Payer: BLUE CROSS/BLUE SHIELD | Attending: Emergency Medicine | Admitting: Emergency Medicine

## 2014-04-25 ENCOUNTER — Emergency Department (HOSPITAL_COMMUNITY): Payer: BLUE CROSS/BLUE SHIELD

## 2014-04-25 ENCOUNTER — Encounter: Payer: Self-pay | Admitting: Cardiology

## 2014-04-25 ENCOUNTER — Ambulatory Visit (INDEPENDENT_AMBULATORY_CARE_PROVIDER_SITE_OTHER): Payer: BLUE CROSS/BLUE SHIELD | Admitting: Cardiology

## 2014-04-25 ENCOUNTER — Encounter (HOSPITAL_COMMUNITY): Payer: Self-pay | Admitting: *Deleted

## 2014-04-25 VITALS — BP 136/82 | HR 89 | Ht 65.5 in | Wt 243.1 lb

## 2014-04-25 DIAGNOSIS — I420 Dilated cardiomyopathy: Secondary | ICD-10-CM | POA: Diagnosis not present

## 2014-04-25 DIAGNOSIS — Z8719 Personal history of other diseases of the digestive system: Secondary | ICD-10-CM | POA: Insufficient documentation

## 2014-04-25 DIAGNOSIS — Z7982 Long term (current) use of aspirin: Secondary | ICD-10-CM | POA: Insufficient documentation

## 2014-04-25 DIAGNOSIS — I5022 Chronic systolic (congestive) heart failure: Secondary | ICD-10-CM | POA: Diagnosis not present

## 2014-04-25 DIAGNOSIS — J449 Chronic obstructive pulmonary disease, unspecified: Secondary | ICD-10-CM | POA: Insufficient documentation

## 2014-04-25 DIAGNOSIS — Z3202 Encounter for pregnancy test, result negative: Secondary | ICD-10-CM | POA: Diagnosis not present

## 2014-04-25 DIAGNOSIS — Z87891 Personal history of nicotine dependence: Secondary | ICD-10-CM | POA: Insufficient documentation

## 2014-04-25 DIAGNOSIS — Z7952 Long term (current) use of systemic steroids: Secondary | ICD-10-CM | POA: Insufficient documentation

## 2014-04-25 DIAGNOSIS — I10 Essential (primary) hypertension: Secondary | ICD-10-CM | POA: Diagnosis not present

## 2014-04-25 DIAGNOSIS — Z79899 Other long term (current) drug therapy: Secondary | ICD-10-CM | POA: Diagnosis not present

## 2014-04-25 DIAGNOSIS — R1013 Epigastric pain: Secondary | ICD-10-CM | POA: Diagnosis present

## 2014-04-25 HISTORY — DX: Dilated cardiomyopathy: I42.0

## 2014-04-25 LAB — URINALYSIS, ROUTINE W REFLEX MICROSCOPIC
Bilirubin Urine: NEGATIVE
Glucose, UA: NEGATIVE mg/dL
Hgb urine dipstick: NEGATIVE
Ketones, ur: NEGATIVE mg/dL
Leukocytes, UA: NEGATIVE
Nitrite: NEGATIVE
PH: 6.5 (ref 5.0–8.0)
Protein, ur: NEGATIVE mg/dL
SPECIFIC GRAVITY, URINE: 1.025 (ref 1.005–1.030)
UROBILINOGEN UA: 1 mg/dL (ref 0.0–1.0)

## 2014-04-25 LAB — CBC WITH DIFFERENTIAL/PLATELET
BASOS PCT: 0 % (ref 0–1)
Basophils Absolute: 0 10*3/uL (ref 0.0–0.1)
Eosinophils Absolute: 0.1 10*3/uL (ref 0.0–0.7)
Eosinophils Relative: 1 % (ref 0–5)
HCT: 42.2 % (ref 36.0–46.0)
Hemoglobin: 13.6 g/dL (ref 12.0–15.0)
Lymphocytes Relative: 11 % — ABNORMAL LOW (ref 12–46)
Lymphs Abs: 1.8 10*3/uL (ref 0.7–4.0)
MCH: 30.2 pg (ref 26.0–34.0)
MCHC: 32.2 g/dL (ref 30.0–36.0)
MCV: 93.6 fL (ref 78.0–100.0)
Monocytes Absolute: 1.2 10*3/uL — ABNORMAL HIGH (ref 0.1–1.0)
Monocytes Relative: 8 % (ref 3–12)
Neutro Abs: 12.7 10*3/uL — ABNORMAL HIGH (ref 1.7–7.7)
Neutrophils Relative %: 80 % — ABNORMAL HIGH (ref 43–77)
PLATELETS: 304 10*3/uL (ref 150–400)
RBC: 4.51 MIL/uL (ref 3.87–5.11)
RDW: 14 % (ref 11.5–15.5)
WBC: 15.8 10*3/uL — ABNORMAL HIGH (ref 4.0–10.5)

## 2014-04-25 LAB — COMPREHENSIVE METABOLIC PANEL
ALT: 22 U/L (ref 0–35)
ANION GAP: 8 (ref 5–15)
AST: 22 U/L (ref 0–37)
Albumin: 3.3 g/dL — ABNORMAL LOW (ref 3.5–5.2)
Alkaline Phosphatase: 109 U/L (ref 39–117)
BILIRUBIN TOTAL: 0.9 mg/dL (ref 0.3–1.2)
BUN: 13 mg/dL (ref 6–23)
CO2: 28 mmol/L (ref 19–32)
CREATININE: 0.86 mg/dL (ref 0.50–1.10)
Calcium: 9.1 mg/dL (ref 8.4–10.5)
Chloride: 106 mmol/L (ref 96–112)
GFR calc non Af Amer: 76 mL/min — ABNORMAL LOW (ref >90)
GFR, EST AFRICAN AMERICAN: 88 mL/min — AB (ref >90)
GLUCOSE: 102 mg/dL — AB (ref 70–99)
Potassium: 4 mmol/L (ref 3.5–5.1)
Sodium: 142 mmol/L (ref 135–145)
Total Protein: 5.7 g/dL — ABNORMAL LOW (ref 6.0–8.3)

## 2014-04-25 LAB — POC URINE PREG, ED: PREG TEST UR: NEGATIVE

## 2014-04-25 LAB — LIPASE, BLOOD: LIPASE: 29 U/L (ref 11–59)

## 2014-04-25 MED ORDER — ALUM & MAG HYDROXIDE-SIMETH 200-200-20 MG/5ML PO SUSP
15.0000 mL | Freq: Once | ORAL | Status: AC
  Administered 2014-04-25: 15 mL via ORAL
  Filled 2014-04-25: qty 30

## 2014-04-25 MED ORDER — HYDROCODONE-ACETAMINOPHEN 5-325 MG PO TABS: 1.0000 | ORAL_TABLET | ORAL | Status: DC | PRN

## 2014-04-25 MED ORDER — MORPHINE SULFATE 4 MG/ML IJ SOLN
4.0000 mg | Freq: Once | INTRAMUSCULAR | Status: AC
  Administered 2014-04-25: 4 mg via INTRAVENOUS
  Filled 2014-04-25: qty 1

## 2014-04-25 NOTE — ED Provider Notes (Signed)
CSN: [id number]     Arrival date & time 04/25/14  1734 History   First MD Initiated Contact with Patient 04/25/14 1743     Chief Complaint  Patient presents with  . Abdominal Pain     (Consider location/radiation/quality/duration/timing/severity/associated sxs/prior Treatment) HPI  This is a 54 year old female with no sig past medical history other than a previous umbilical hernia repair. Comes in from clinic with abdominal pain started suddenly about an hour and a half ago, is epigastric/right upper quadrant describes it as stabbing. The pain is intermittent/waxing/waning no nausea/vomiting/diarrhea/constipation last bowel movement was today she's had no fever chills dysuria hematuria.  No previous pain with eating.  Past Medical History  Diagnosis Date  . COPD (chronic obstructive pulmonary disease)   . Hypertension    Past Surgical History  Procedure Laterality Date  . Harrington rod scoliosis      1981  . Fasciotomy      1993  . Umbilical hernia repair    . Vesicovaginal fistula closure w/ tah    . Carpal tunnel release    . Artery repair    . Rotator cuff repair    . Ankle artery involved cyst removal     Family History  Problem Relation Age of Onset  . Emphysema Father   . Allergies Father   . Heart failure Father   . Heart disease Father 40    MI   History  Substance Use Topics  . Smoking status: Former Smoker -- 1.00 packs/day for 15 years    Types: Cigarettes    Quit date: 02/14/2005  . Smokeless tobacco: Never Used  . Alcohol Use: Yes     Comment: occ   OB History    No data available     Review of Systems  Constitutional: Negative for fever and chills.  HENT: Negative for nosebleeds.   Eyes: Negative for visual disturbance.  Respiratory: Negative for cough and shortness of breath.   Cardiovascular: Negative for chest pain.  Gastrointestinal: Positive for abdominal pain. Negative for nausea, vomiting, diarrhea and constipation.  Genitourinary:  Negative for dysuria.  Skin: Negative for rash.  Neurological: Negative for weakness.      Allergies  Demerol  Home Medications   Prior to Admission medications   Medication Sig Start Date End Date Taking? Authorizing Provider  albuterol (PROVENTIL) (2.5 MG/3ML) 0.083% nebulizer solution Take 2.5 mg by nebulization every 4 (four) hours as needed for wheezing or shortness of breath (((PLAN C))).   Yes Historical Provider, MD  aspirin 81 MG tablet Take 81 mg by mouth at bedtime.    Yes Historical Provider, MD  Cholecalciferol (VITAMIN D-3) 1000 UNITS CAPS Take 1 capsule by mouth daily.   Yes Historical Provider, MD  clonazePAM (KLONOPIN) 1 MG tablet Take 0.5 mg by mouth at bedtime.    Yes Historical Provider, MD  famotidine (PEPCID) 20 MG tablet Take 20 mg by mouth at bedtime.   Yes Historical Provider, MD  furosemide (LASIX) 20 MG tablet Take 1 tablet (20 mg total) by mouth daily. 01/10/14  Yes Nyoka Cowden, MD  gabapentin (NEURONTIN) 400 MG capsule Take 400 mg by mouth at bedtime.    Yes Historical Provider, MD  Lactobacillus (ACIDOPHILUS) CAPS capsule Take 1 capsule by mouth daily.   Yes Historical Provider, MD  Lutein-Zeaxanthin 25-5 MG CAPS Take 1 capsule by mouth at bedtime.    Yes Historical Provider, MD  Multiple Vitamin (MULTIVITAMIN WITH MINERALS) TABS tablet Take 1 tablet by mouth  daily.   Yes Historical Provider, MD  omeprazole (PRILOSEC) 20 MG capsule Take 20 mg by mouth every morning.    Yes Historical Provider, MD  tiotropium (SPIRIVA) 18 MCG inhalation capsule Place 18 mcg into inhaler and inhale daily.   Yes Historical Provider, MD  venlafaxine XR (EFFEXOR-XR) 150 MG 24 hr capsule Take 150 mg by mouth daily with breakfast.   Yes Historical Provider, MD  venlafaxine XR (EFFEXOR-XR) 75 MG 24 hr capsule Take 75 mg by mouth daily with breakfast.   Yes Historical Provider, MD  verapamil (CALAN-SR) 120 MG CR tablet Take 120 mg by mouth 2 (two) times daily.   Yes Historical  Provider, MD  Aclidinium Bromide (TUDORZA PRESSAIR) 400 MCG/ACT AEPB Inhale 1 puff into the lungs 2 (two) times daily. 04/10/14   Tammy S Parrett, NP  HYDROcodone-acetaminophen (NORCO/VICODIN) 5-325 MG per tablet Take 1 tablet by mouth every 4 (four) hours as needed. 04/25/14   Silas Flood, MD  moxifloxacin (AVELOX) 400 MG tablet Take 400 mg by mouth daily. 04/16/14   Historical Provider, MD  predniSONE (DELTASONE) 20 MG tablet Take 20 mg by mouth daily. 04/16/14   Historical Provider, MD  topiramate (TOPAMAX) 50 MG tablet Take 50 mg by mouth at bedtime. 04/21/14   Historical Provider, MD   BP 116/65 mmHg  Pulse 87  Temp(Src) 98.3 F (36.8 C) (Oral)  Resp 19  SpO2 95% Physical Exam  Constitutional: She is oriented to person, place, and time. No distress.  HENT:  Head: Normocephalic and atraumatic.  Eyes: EOM are normal. Pupils are equal, round, and reactive to light.  Neck: Normal range of motion. Neck supple.  Cardiovascular: Normal rate and intact distal pulses.   Pulmonary/Chest: No respiratory distress.  Abdominal: Soft. There is tenderness (epigastric). There is no rebound and no guarding.  Neurological: She is alert and oriented to person, place, and time.  Skin: No rash noted. She is not diaphoretic.    ED Course  Procedures (including critical care time) Labs Review Labs Reviewed  CBC WITH DIFFERENTIAL/PLATELET - Abnormal; Notable for the following:    WBC 15.8 (*)    Neutrophils Relative % 80 (*)    Neutro Abs 12.7 (*)    Lymphocytes Relative 11 (*)    Monocytes Absolute 1.2 (*)    All other components within normal limits  COMPREHENSIVE METABOLIC PANEL - Abnormal; Notable for the following:    Glucose, Bld 102 (*)    Total Protein 5.7 (*)    Albumin 3.3 (*)    GFR calc non Af Amer 76 (*)    GFR calc Af Amer 88 (*)    All other components within normal limits  LIPASE, BLOOD  URINALYSIS, ROUTINE W REFLEX MICROSCOPIC  POC URINE PREG, ED    Imaging Review US Abdomen  Complete  04/25/2014   CLINICAL DATA:  Abdominal pain.  Nausea vomiting.  EXAM: ULTRASOUND ABDOMEN COMPLETE  COMPARISON:  None.  FINDINGS: Gallbladder: No gallstones or wall thickening visualized. No sonographic Murphy sign noted.  Common bile duct: Diameter: 3.6 mm  Liver: No focal lesion identified. Within normal limits in parenchymal echogenicity.  IVC: No abnormality visualized.  Pancreas: Visualized portion unremarkable.  Spleen: Size and appearance within normal limits.  Right Kidney: Length: 9.8 cm. Echogenicity within normal limits. No mass or hydronephrosis visualized.  Left Kidney: Length: 11.5 cm. Echogenicity within normal limits. No mass or hydronephrosis visualized.  Abdominal aorta: Measures 2.5 cm.  Other findings: None.  IMPRESSION: 1. No acute  findings. 2. Ectatic abdominal aorta at risk for aneurysm development. Recommend followup by ultrasound in 5 years. This recommendation follows ACR consensus guidelines: White Paper of the ACR Incidental Findings Committee II on Vascular Findings. J Am Coll Radiol 2013; 10:789-794.   Electronically Signed   By: Signa Kell M.D.   On: 04/25/2014 19:57     EKG Interpretation None      MDM   Final diagnoses:  Epigastric pain   Ekg:  No ST segment changes or t-waves abnormalities.  MDM: This is a 54 year old office in past medical history other than a previous umbilical hernia repair. Comes to the hospital today because of epigastric abdominal pain that started on hour half ago waxing and waning sharp with no associated symptoms.  Exam as above she's afebrile she is hemodynamically stable she's tender palpation focally in the right upper quadrant as well as epigastric region concern for possible cholecystitis versus biliary colic. Will obtain labs and a right upper quadrant ultrasound.  Labs show normal lipase is a mild leukocytosis but is afebrile.  I have given morphine for pain with some improvement in the patient's abdominal pain.   Abdominal ultrasound obtained and shows no evidence of AAA no evidence of hydronephrosis on the right side no evidence of gallstones or cholecystitis.  Some ectasia of the aorta, f/u u/s recommended which I have informed the patient of.   Doubt SBO given patient having regular bowel movements. UA w/o infection doubt pyelo.  Doubt pancreatitis given normal lipase doubt appendicitis given the location of pain.  At this time with the patient safe for discharge with close outpatient follow-up with pcp.  Will have her return to emergency room if she has any questions concerns or worsening of her pain.  I have discussed the results, Dx and Tx plan with the patient. They expressed understanding and agree with the plan and were told to return to ED with any worsening of condition or concern.    Disposition: Discharge  Condition: Good  New Prescriptions   HYDROCODONE-ACETAMINOPHEN (NORCO/VICODIN) 5-325 MG PER TABLET    Take 1 tablet by mouth every 4 (four) hours as needed.    Follow Up: Blane Ohara, MD [address] [place] [address] [phone]  In 4 days    Pt seen in conjunction with Dr. Daphene Calamity, MD 04/25/14 2100  Blake Divine, MD 04/26/14 [phone]

## 2014-04-25 NOTE — ED Notes (Signed)
Pt sent her from [facility] for acute onset epigastric pain accompanied by diaphoresis she describes as "stabbing".  Pt was in lobby awaiting evaluation for cardiomyopathy when pain began.  Staff did ekg which was unremarkable.  bp 166/110.  Denies sob or nausea.

## 2014-04-25 NOTE — Discharge Instructions (Signed)

## 2014-04-25 NOTE — Patient Instructions (Signed)
Your physician has requested that you have a lexiscan myoview. For further information please visit [email or web address] Please follow instruction sheet, as given.  Your physician recommends that you schedule a follow-up appointment in: 4 weeks with Dr. Mayford Knife.

## 2014-04-25 NOTE — ED Provider Notes (Addendum)
I saw and evaluated the patient, reviewed the resident's note and I agree with the findings and plan.   EKG Interpretation   Date/Time:  Friday April 25 2014 18:03:27 EST Ventricular Rate:  87 PR Interval:  182 QRS Duration: 98 QT Interval:  396 QTC Calculation: 476 R Axis:   -29 Text Interpretation:  Sinus rhythm Probable left atrial enlargement  Borderline left axis deviation No old tracing to compare Confirmed by  [facility]  MD, TREY ([id number]) on 04/26/2014 12:26:46 AM      54 yo female with RUQ and epigastric abdominal pain.  On exam, well appearing, nontoxic, not distressed, normal respiratory effort, normal perfusion, TTP of RUQ without r/r/g.  She has received IV morphine with improvement in pain.  RUQ [place] pending.     Imaging negative.  Pain controlled.  She appeared appropriate for DC with outpatient follow up.  Clinical Impression: 1. Epigastric pain       Blake Divine, MD 04/26/14 0932  Blake Divine, MD 04/26/14 Jacinta Shoe

## 2014-04-25 NOTE — Progress Notes (Signed)
Cardiology Office Note   Date:  04/25/2014   ID:  Judy [name], DOB Sep 14, 1960, MRN [id number]  PCP:  Blane Ohara, MD  Cardiologist:   Quintella Reichert, MD   Chief Complaint  Patient presents with  . Cardiomyopathy      History of Present Illness: Judy [name] is a 54 y.o. female who presents for evaluation of reduced LVF.  She is followed by Dr. Sherene Sires for COPD and recently had a COPD exacerbation with increased cough productive of green sputum and was treated with antibiotic, steoids and inhalers and just finished the Levaquin.  Her symptoms significantly improved after treatment.  She thinks that her breathing is back to baseline.  Denies any fever but now is having abdominal pain and chills.  She says that she has been having some palpitations but denies any chest pressure or pain.  She is on home O2 at night.  She has chronic LE edema.  She has never had any heart problems in the past except for being told she has a heart murmur as a child.      Past Medical History  Diagnosis Date  . COPD (chronic obstructive pulmonary disease)   . Hypertension     Past Surgical History  Procedure Laterality Date  . Harrington rod scoliosis      1981  . Fasciotomy      1993  . Umbilical hernia repair    . Vesicovaginal fistula closure w/ tah    . Carpal tunnel release    . Artery repair    . Rotator cuff repair    . Ankle artery involved cyst removal       Current Outpatient Prescriptions  Medication Sig Dispense Refill  . Aclidinium Bromide (TUDORZA PRESSAIR) 400 MCG/ACT AEPB Inhale 1 puff into the lungs 2 (two) times daily. 1 each 5  . albuterol (PROVENTIL) (2.5 MG/3ML) 0.083% nebulizer solution Take 2.5 mg by nebulization every 4 (four) hours as needed for wheezing or shortness of breath (((PLAN C))).    Marland Kitchen aspirin 81 MG tablet Take 81 mg by mouth daily.    . butalbital-acetaminophen-caffeine (FIORICET, ESGIC) 50-325-40 MG per tablet Take 2 tablets by mouth every 8  (eight) hours as needed for headache.     . Cholecalciferol (VITAMIN D-3) 1000 UNITS CAPS Take 1 capsule by mouth daily.    . clonazePAM (KLONOPIN) 1 MG tablet 1-2 tablet at bedtime    . famotidine (PEPCID) 20 MG tablet Take 20 mg by mouth at bedtime.    . furosemide (LASIX) 20 MG tablet Take 1 tablet (20 mg total) by mouth daily. 30 tablet 0  . gabapentin (NEURONTIN) 400 MG capsule 1 in the morning, 1 in the evening and 2 at bedtime    . Lactobacillus (ACIDOPHILUS) CAPS capsule Take 1 capsule by mouth daily.    . Lutein-Zeaxanthin 25-5 MG CAPS Take 1 capsule by mouth at bedtime.     Marland Kitchen omeprazole (PRILOSEC) 20 MG capsule Take 20 mg by mouth daily.    Marland Kitchen oxymetazoline (AFRIN) 0.05 % nasal spray Place 1 spray into both nostrils 2 (two) times daily as needed (x3 days).    Marland Kitchen PROAIR HFA 108 (90 BASE) MCG/ACT inhaler Inhale 2 puffs into the lungs every 4 (four) hours as needed for wheezing or shortness of breath (((PLAN B))).     . venlafaxine (EFFEXOR) 75 MG tablet Take 75 mg by mouth daily.    Marland Kitchen venlafaxine XR (EFFEXOR-XR) 150 MG 24 hr capsule Take  150 mg by mouth daily with breakfast.    . verapamil (CALAN-SR) 120 MG CR tablet Take 120 mg by mouth 2 (two) times daily.     No current facility-administered medications for this visit.    Allergies:   Demerol    Social History:  The patient  reports that she quit smoking about 9 years ago. Her smoking use included Cigarettes. She has a 15 pack-year smoking history. She has never used smokeless tobacco. She reports that she drinks alcohol. She reports that she does not use illicit drugs.   Family History:  The patient's family history includes Allergies in her father; Emphysema in her father; Heart disease (age of onset: 13) in her father; Heart failure in her father.    ROS:  Please see the history of present illness.   Otherwise, review of systems are positive for severe abdominal pain.   All other systems are reviewed and negative.    PHYSICAL  EXAM: VS:  BP 136/82 mmHg  Pulse 89  Ht 5' 5.5" (1.664 m)  Wt 243 lb 1.9 oz (110.279 kg)  BMI 39.83 kg/m2 , BMI Body mass index is 39.83 kg/(m^2). GEN: Well nourished, well developed, in no acute distress HEENT: normal Neck: no JVD, carotid bruits, or masses Cardiac: RRR; no murmurs, rubs, or gallops,no edema  Respiratory:  clear to auscultation bilaterally, normal work of breathing GI: severe abdominal pain with severe tenderness to palpation throughout MS: no deformity or atrophy Skin: warm and dry, no rash Neuro:  Strength and sensation are intact Psych: euthymic mood, full affect   EKG:  EKG is ordered today. The ekg ordered today demonstrates NSR with no ST changes   Recent Labs: 10/08/2013: TSH 1.68 01/08/2014: BUN 16; Creatinine 0.7; Hemoglobin 13.4; Platelets 295.0; Potassium 4.5; Sodium 144 04/10/2014: Pro B Natriuretic peptide (BNP) 498.0*    Lipid Panel No results found for: CHOL, TRIG, HDL, CHOLHDL, VLDL, LDLCALC, LDLDIRECT    Wt Readings from Last 3 Encounters:  04/25/14 243 lb 1.9 oz (110.279 kg)  04/10/14 236 lb (107.049 kg)  03/27/14 236 lb (107.049 kg)      Other studies Reviewed: Additional studies/ records that were reviewed today include: office notes from Rubye Oaks, NP. Review of the above records demonstrates: mild DCM EF 45% with elevated BNP 400   ASSESSMENT AND PLAN:  1.  Dilated cardiomyopathy with EF 45%.  She has no history of CAD but has a family history of CAD with her father having an MI in his 44's and has CHF.  She denies any history of chest pain. She has chronic SOB from her COPD and recently had worsening SOB due to acute exacerbation and that has resolved after treatment with steroids and antibiotics.  EKG is nonichemic.  Once she has recovered from her acute abdominal process we will set up a Lexiscan myoview to rule out ischemia.   2.  Severe acute abdominal pain that started in our office.  There is no associated nausea,  vomiting or diarrhea but she does have some chills.  Abdomen very tender to palpation.  Patient is writhing in pain and crying.  She cannot get comfortable.  EMS called and patient will be transferred to [facility] for further evaluation.   3.  COPD 4.  Elevated BNP in setting of acute COPD exacerbation a few weeks ago.  She appears euvolemic on exam today.     Current medicines are reviewed at length with the patient today.  The patient does not  have concerns regarding medicines.  The following changes have been made:  no change  Labs/ tests ordered today include: Lexiscan myoview   Orders Placed This Encounter  Procedures  . Myocardial Perfusion Imaging  . EKG 12-Lead     Disposition:   FU with me in 4 weeks   Signed, Quintella Reichert, MD  04/25/2014 5:28 PM    [facility] [address] [place], [place], [place]  [address] Phone: [phone]; Fax: [phone]

## 2014-05-08 ENCOUNTER — Ambulatory Visit (HOSPITAL_COMMUNITY): Payer: BLUE CROSS/BLUE SHIELD | Attending: Interventional Cardiology | Admitting: Radiology

## 2014-05-08 VITALS — BP 126/91 | Ht 65.0 in | Wt 235.0 lb

## 2014-05-08 DIAGNOSIS — I10 Essential (primary) hypertension: Secondary | ICD-10-CM | POA: Insufficient documentation

## 2014-05-08 DIAGNOSIS — R0609 Other forms of dyspnea: Secondary | ICD-10-CM | POA: Insufficient documentation

## 2014-05-08 DIAGNOSIS — R002 Palpitations: Secondary | ICD-10-CM | POA: Diagnosis not present

## 2014-05-08 DIAGNOSIS — I420 Dilated cardiomyopathy: Secondary | ICD-10-CM

## 2014-05-08 DIAGNOSIS — R0602 Shortness of breath: Secondary | ICD-10-CM

## 2014-05-08 DIAGNOSIS — I5022 Chronic systolic (congestive) heart failure: Secondary | ICD-10-CM

## 2014-05-08 MED ORDER — TECHNETIUM TC 99M SESTAMIBI GENERIC - CARDIOLITE
30.0000 | Freq: Once | INTRAVENOUS | Status: AC | PRN
  Administered 2014-05-08: 30 via INTRAVENOUS

## 2014-05-08 MED ORDER — REGADENOSON 0.4 MG/5ML IV SOLN
0.4000 mg | Freq: Once | INTRAVENOUS | Status: AC
  Administered 2014-05-08: 0.4 mg via INTRAVENOUS

## 2014-05-08 MED ORDER — AMINOPHYLLINE 25 MG/ML IV SOLN
75.0000 mg | Freq: Once | INTRAVENOUS | Status: AC
  Administered 2014-05-08: 75 mg via INTRAVENOUS

## 2014-05-08 NOTE — Progress Notes (Signed)
[facility] SITE 3 NUCLEAR MED [address] [place], [place] [address] [phone]    Cardiology Nuclear Med Study  Judy [name] is a 54 y.o. female     MRN : [id number]     DOB: [birth year]  Procedure Date: 05/08/2014  Nuclear Med Background Indication for Stress Test:  Evaluation for Ischemia History:  COPD  Cardiac Risk Factors: Hypertension  Symptoms:  DOE and Palpitations   Nuclear Pre-Procedure Caffeine/Decaff Intake:  None NPO After: 8:00pm   Lungs:  clear O2 Sat: 97% on room air. IV 0.9% NS with Angio Cath:  22g  IV Site: R Hand  IV Started by:  Cathlyn Parsons, RN  Chest Size (in):  42 Cup Size: DD  Height: 5\' 5"  (1.651 m)  Weight:  235 lb (106.595 kg)  BMI:  Body mass index is 39.11 kg/(m^2). Tech Comments:  Aminophylline 75 mg given for symptoms. All were resolved before leaving. S.Williams EMTP    Nuclear Med Study 1 or 2 day study: 2 day  Stress Test Type:  Lexiscan  Reading MD: n/a  Order Authorizing Provider:  Gevena Cotton  Resting Radionuclide: Technetium 31m Sestamibi  Resting Radionuclide Dose: 33.0 mCi on 05/12/14   Stress Radionuclide:  Technetium 28m Sestamibi  Stress Radionuclide Dose: 33.0 mCi on 05/08/14           Stress Protocol Rest HR: 68 Stress HR: 93  Rest BP: 126/91 Stress BP: 113/76  Exercise Time (min): n/a METS: n/a   Predicted Max HR: 167 bpm % Max HR: 55.69 bpm Rate Pressure Product: 16010   Dose of Adenosine (mg):  n/a Dose of Lexiscan: 0.4 mg  Dose of Atropine (mg): n/a Dose of Dobutamine: n/a mcg/kg/min (at max HR)  Stress Test Technologist: Milana Na, EMT-P  Nuclear Technologist:  Kerby Nora, CNMT     Rest Procedure:  Myocardial perfusion imaging was performed at rest 45 minutes following the intravenous administration of Technetium 90m Sestamibi. Rest ECG: NSR with non-specific ST-T wave changes  Stress Procedure:  The patient received IV Lexiscan 0.4 mg over 15-seconds.  Technetium 74m  Sestamibi injected at 30-seconds. The patient had sob, neck tightness, abdominal cramps, and a headache with the Lexiscan injection. Quantitative spect images were obtained after a 45 minute delay. Stress ECG: No significant ST segment change suggestive of ischemia.  QPS Raw Data Images:  Acquisition technically good; LVE. Stress Images:  There is decreased uptake in the anterior wall, inferior wall and apex. Rest Images:  There is decreased uptake in the anterior wall, inferior wall and apex, less prominent compared to the stress images. Subtraction (SDS):  These findings are consistent withn prior infarct and mild peri-infarct ischemia. Transient Ischemic Dilatation (Normal <1.22):  1.02 Lung/Heart Ratio (Normal <0.45):  0.30  Quantitative Gated Spect Images QGS EDV:  228 ml QGS ESV:  166 ml  Impression Exercise Capacity:  Lexiscan with no exercise. BP Response:  Normal blood pressure response. Clinical Symptoms:  There is dyspnea and neck tightness. ECG Impression:  No significant ST segment change suggestive of ischemia. Comparison with Prior Nuclear Study: No previous nuclear study performed  Overall Impression:  High risk stress nuclear study with moderate size, severe intensity, partially reversible mid anterior, inferior and apical defects consistent with prior infarct and mild peri-infarct ischemia; study high risk due to reduced LV function.  LV Ejection Fraction: 27%.  LV Wall Motion:  Global hypokinesis and severe LVE.  Olga Millers

## 2014-05-12 ENCOUNTER — Ambulatory Visit (HOSPITAL_COMMUNITY): Payer: BLUE CROSS/BLUE SHIELD | Attending: Internal Medicine

## 2014-05-12 DIAGNOSIS — R0989 Other specified symptoms and signs involving the circulatory and respiratory systems: Secondary | ICD-10-CM

## 2014-05-12 MED ORDER — TECHNETIUM TC 99M SESTAMIBI GENERIC - CARDIOLITE
33.0000 | Freq: Once | INTRAVENOUS | Status: AC | PRN
  Administered 2014-05-12: 33 via INTRAVENOUS

## 2014-05-13 ENCOUNTER — Ambulatory Visit (INDEPENDENT_AMBULATORY_CARE_PROVIDER_SITE_OTHER): Payer: BLUE CROSS/BLUE SHIELD | Admitting: Nurse Practitioner

## 2014-05-13 ENCOUNTER — Other Ambulatory Visit: Payer: Self-pay | Admitting: Nurse Practitioner

## 2014-05-13 ENCOUNTER — Encounter: Payer: Self-pay | Admitting: Nurse Practitioner

## 2014-05-13 VITALS — BP 102/76 | HR 70 | Ht 65.0 in | Wt 234.0 lb

## 2014-05-13 DIAGNOSIS — I10 Essential (primary) hypertension: Secondary | ICD-10-CM | POA: Diagnosis not present

## 2014-05-13 DIAGNOSIS — I519 Heart disease, unspecified: Secondary | ICD-10-CM

## 2014-05-13 DIAGNOSIS — F17201 Nicotine dependence, unspecified, in remission: Secondary | ICD-10-CM

## 2014-05-13 DIAGNOSIS — R931 Abnormal findings on diagnostic imaging of heart and coronary circulation: Secondary | ICD-10-CM

## 2014-05-13 DIAGNOSIS — R9439 Abnormal result of other cardiovascular function study: Secondary | ICD-10-CM

## 2014-05-13 LAB — BASIC METABOLIC PANEL
BUN: 19 mg/dL (ref 6–23)
CO2: 27 mEq/L (ref 19–32)
Calcium: 9.6 mg/dL (ref 8.4–10.5)
Chloride: 106 mEq/L (ref 96–112)
Creatinine, Ser: 0.76 mg/dL (ref 0.40–1.20)
GFR: 84.34 mL/min (ref >60.00)
Glucose, Bld: 103 mg/dL — ABNORMAL HIGH (ref 70–99)
Potassium: 3.6 mEq/L (ref 3.5–5.1)
Sodium: 140 mEq/L (ref 135–145)

## 2014-05-13 LAB — CBC
HCT: 42.8 % (ref 36.0–46.0)
Hemoglobin: 14.1 g/dL (ref 12.0–15.0)
MCHC: 33 g/dL (ref 30.0–36.0)
MCV: 91.8 fl (ref 78.0–100.0)
Platelets: 317 10*3/uL (ref 150.0–400.0)
RBC: 4.66 Mil/uL (ref 3.87–5.11)
RDW: 14.6 % (ref 11.5–15.5)
WBC: 10.8 10*3/uL — ABNORMAL HIGH (ref 4.0–10.5)

## 2014-05-13 LAB — HEPATIC FUNCTION PANEL
ALT: 19 U/L (ref 0–35)
AST: 15 U/L (ref 0–37)
Albumin: 3.9 g/dL (ref 3.5–5.2)
Alkaline Phosphatase: 126 U/L — ABNORMAL HIGH (ref 39–117)
Bilirubin, Direct: 0.1 mg/dL (ref 0.0–0.3)
Total Bilirubin: 0.8 mg/dL (ref 0.2–1.2)
Total Protein: 6.9 g/dL (ref 6.0–8.3)

## 2014-05-13 LAB — PROTIME-INR
INR: 1 ratio (ref 0.8–1.0)
Prothrombin Time: 10.6 s (ref 9.6–13.1)

## 2014-05-13 LAB — LIPID PANEL
Cholesterol: 266 mg/dL — ABNORMAL HIGH (ref 0–200)
HDL: 53.9 mg/dL (ref >39.00)
LDL Cholesterol: 179 mg/dL — ABNORMAL HIGH (ref 0–99)
NonHDL: 212.1
Total CHOL/HDL Ratio: 5
Triglycerides: 166 mg/dL — ABNORMAL HIGH (ref 0.0–149.0)
VLDL: 33.2 mg/dL (ref 0.0–40.0)

## 2014-05-13 LAB — APTT: aPTT: 27.6 s (ref 23.4–32.7)

## 2014-05-13 NOTE — Patient Instructions (Addendum)
We will be checking the following labs today BMET, CBC, PT, PTT, Lipids, HPF  Your provider has recommended a cardiac catherization  You are scheduled for a cardiac catheterization tomorrow with Dr. Clifton James or associate.  Go to [facility] 2nd Floor Short Stay tomorrow at 11AM.  Enter thru the [facility] entrance A No food or drink after midnight tonight. You may take your medications with a sip of water on the day of your procedure. You can hold your dose of Lasix tomorrow.   Coronary Angiogram A coronary angiogram, also called coronary angiography, is an X-ray procedure used to look at the arteries in the heart. In this procedure, a dye (contrast dye) is injected through a long, hollow tube (catheter). The catheter is about the size of a piece of cooked spaghetti and is inserted through your groin, wrist, or arm. The dye is injected into each artery, and X-rays are then taken to show if there is a blockage in the arteries of your heart.  LET [facility] CARE PROVIDER KNOW ABOUT:  Any allergies you have, including allergies to shellfish or contrast dye.   All medicines you are taking, including vitamins, herbs, eye drops, creams, and over-the-counter medicines.   Previous problems you or members of your family have had with the use of anesthetics.   Any blood disorders you have.   Previous surgeries you have had.  History of kidney problems or failure.   Other medical conditions you have.  RISKS AND COMPLICATIONS  Generally, a coronary angiogram is a safe procedure. However, about 1 person out of 1000 can have problems that may include:  Allergic reaction to the dye.  Bleeding/bruising from the access site or other locations.  Kidney injury, especially in people with impaired kidney function.  Stroke (rare).  Heart attack (rare).  Irregular rhythms (rare)  Death (rare)  BEFORE THE PROCEDURE   Do not eat or drink anything after midnight the night before the  procedure or as directed by your health care provider.   Ask your health care provider about changing or stopping your regular medicines. This is especially important if you are taking diabetes medicines or blood thinners.  PROCEDURE  You may be given a medicine to help you relax (sedative) before the procedure. This medicine is given through an intravenous (IV) access tube that is inserted into one of your veins.   The area where the catheter will be inserted will be washed and shaved. This is usually done in the groin but may be done in the fold of your arm (near your elbow) or in the wrist.   A medicine will be given to numb the area where the catheter will be inserted (local anesthetic).   The health care provider will insert the catheter into an artery. The catheter will be guided by using a special type of X-ray (fluoroscopy) of the blood vessel being examined.   A special dye will then be injected into the catheter, and X-rays will be taken. The dye will help to show where any narrowing or blockages are located in the heart arteries.    AFTER THE PROCEDURE   If the procedure is done through the leg, you will be kept in bed lying flat for several hours. You will be instructed to not bend or cross your legs.  The insertion site will be checked frequently.   The pulse in your feet or wrist will be checked frequently.   Additional blood tests, X-rays, and an electrocardiogram  may be done.

## 2014-05-13 NOTE — Progress Notes (Signed)
CARDIOLOGY OFFICE NOTE  Date:  05/13/2014    Judy [name] Date of Birth: Jul 07, 1960 Medical Record #[id number]  PCP:  Blane Ohara, MD  Cardiologist:  Mayford Knife  Chief Complaint  Patient presents with  . Follow up visit after abnormal Myoview    Seen for Dr. Mayford Knife.      History of Present Illness: Judy [name] is a 54 y.o. female who presents today for a follow up visit after having abnormal Myoview. She was referred here earlier this month for evaluation of reduced LVF.She is seen for Dr. Mayford Knife.  She is followed by Dr. Sherene Sires for COPD and recently had a COPD exacerbation with increased cough productive of green sputum and was treated with antibiotic, steoids and inhalers.   She is on home O2 at night. She has chronic LE edema. She is a former smoker. She is obese.   Dr. Mayford Knife arranged for Myoview due to +FH for CAD - see below. When she was seen here for her first evaluation, she had the abrupt onset of abdominal pain - ending up going by EMS to the ER - had an abdominal US - see below.   Comes in today. Here with her husband. She is pretty tearful. She has had shortness of breath with an associated chest tightness - this has been going on since September. She is not lightheaded or dizzy. Some swelling. Struggles with her weight. No regular exercise. No syncope noted. Some palpitations at times.   Past Medical History  Diagnosis Date  . COPD (chronic obstructive pulmonary disease)   . Hypertension   . LV dysfunction   . Obesity   . Former tobacco use   . Scoliosis     Past Surgical History  Procedure Laterality Date  . Harrington rod scoliosis      1981  . Fasciotomy      1993  . Umbilical hernia repair    . Vesicovaginal fistula closure w/ tah    . Carpal tunnel release    . Artery repair    . Rotator cuff repair    . Ankle artery involved cyst removal       Medications: Current Outpatient Prescriptions  Medication Sig Dispense Refill  . Aclidinium  Bromide (TUDORZA PRESSAIR) 400 MCG/ACT AEPB Inhale 1 puff into the lungs 2 (two) times daily. 1 each 5  . albuterol (PROVENTIL) (2.5 MG/3ML) 0.083% nebulizer solution Take 2.5 mg by nebulization every 4 (four) hours as needed for wheezing or shortness of breath (((PLAN C))).    Marland Kitchen aspirin 81 MG tablet Take 81 mg by mouth at bedtime.     . Cholecalciferol (VITAMIN D-3) 1000 UNITS CAPS Take 1 capsule by mouth daily.    . clonazePAM (KLONOPIN) 1 MG tablet Take 0.5 mg by mouth at bedtime.     . famotidine (PEPCID) 20 MG tablet Take 20 mg by mouth at bedtime.    . furosemide (LASIX) 20 MG tablet Take 1 tablet (20 mg total) by mouth daily. 30 tablet 0  . gabapentin (NEURONTIN) 400 MG capsule Take 800 mg by mouth at bedtime.     . Lactobacillus (ACIDOPHILUS) CAPS capsule Take 1 capsule by mouth daily.    . Lutein-Zeaxanthin 25-5 MG CAPS Take 1 capsule by mouth at bedtime.     . Multiple Vitamin (MULTIVITAMIN WITH MINERALS) TABS tablet Take 1 tablet by mouth daily.    Marland Kitchen omeprazole (PRILOSEC) 20 MG capsule Take 20 mg by mouth every morning.     Marland Kitchen  oxyCODONE-acetaminophen (PERCOCET) 7.5-325 MG per tablet Take 1 tablet by mouth 2 (two) times daily as needed for pain (for pain).    Marland Kitchen tiotropium (SPIRIVA) 18 MCG inhalation capsule Place 18 mcg into inhaler and inhale daily.    Marland Kitchen topiramate (TOPAMAX) 50 MG tablet Take 50 mg by mouth at bedtime.    Marland Kitchen venlafaxine XR (EFFEXOR-XR) 150 MG 24 hr capsule Take 150 mg by mouth daily with breakfast.    . venlafaxine XR (EFFEXOR-XR) 75 MG 24 hr capsule Take 75 mg by mouth daily with breakfast.    . verapamil (CALAN-SR) 120 MG CR tablet Take 120 mg by mouth 2 (two) times daily.     No current facility-administered medications for this visit.    Allergies: Allergies  Allergen Reactions  . Demerol [Meperidine] Nausea And Vomiting    Social History: The patient  reports that she quit smoking about 9 years ago. Her smoking use included Cigarettes. She has a 15  pack-year smoking history. She has never used smokeless tobacco. She reports that she drinks alcohol. She reports that she does not use illicit drugs.   Family History: The patient's family history includes Allergies in her father; Emphysema in her father; Heart disease (age of onset: 41) in her father; Heart failure in her father.   Review of Systems: Please see the history of present illness.   Otherwise, the review of systems is positive for dyspnea, leg swelling. DOE, visual disturbance, depression, back pain, muscle pain, easy bruising, excessive sweating, irregular heart beats, snoring, constipation, anxiety, balance issues and headaches.   All other systems are reviewed and negative.   Physical Exam: VS:  BP 102/76 mmHg  Pulse 70  Ht  (1.651 m)  Wt 234 lb (106.142 kg)  BMI 38.94 kg/m2 .  BMI Body mass index is 38.94 kg/(m^2).  Wt Readings from Last 3 Encounters:  05/13/14 234 lb (106.142 kg)  05/08/14 235 lb (106.595 kg)  04/25/14 243 lb 1.9 oz (110.279 kg)    General: Pleasant. Well developed, well nourished and in no acute distress. She is morbidly obese.  HEENT: Normal. Neck: Supple, no JVD, carotid bruits, or masses noted.  Cardiac: Regular rate and rhythm. No murmurs, rubs, or gallops. No edema.  Respiratory:  Lungs are clear to auscultation bilaterally with normal work of breathing.  GI: Soft and nontender.  MS: No deformity or atrophy. Gait and ROM intact. Skin: Warm and dry. Color is normal.  Neuro:  Strength and sensation are intact and no gross focal deficits noted.  Psych: Alert, appropriate and with normal affect.   LABORATORY DATA:  EKG:  EKG is not ordered today.   Lab Results  Component Value Date   WBC 15.8* 04/25/2014   HGB 13.6 04/25/2014   HCT 42.2 04/25/2014   PLT 304 04/25/2014   GLUCOSE 102* 04/25/2014   ALT 22 04/25/2014   AST 22 04/25/2014   NA 142 04/25/2014   K 4.0 04/25/2014   CL 106 04/25/2014   CREATININE 0.86 04/25/2014   BUN  13 04/25/2014   CO2 28 04/25/2014   TSH 1.68 10/08/2013    BNP (last 3 results) No results for input(s): BNP in the last 8760 hours.  ProBNP (last 3 results)  Recent Labs  10/08/13 1300 01/08/14 1022 04/10/14 1029  PROBNP 452.0* 435.0* 498.0*     Other Studies Reviewed Today:  Myoview Impression from March 2016 Exercise Capacity: Lexiscan with no exercise. BP Response: Normal blood pressure response. Clinical Symptoms: There  is dyspnea and neck tightness. ECG Impression: No significant ST segment change suggestive of ischemia. Comparison with Prior Nuclear Study: No previous nuclear study performed  Overall Impression: High risk stress nuclear study with moderate size, severe intensity, partially reversible mid anterior, inferior and apical defects consistent with prior infarct and mild peri-infarct ischemia; study high risk due to reduced LV function.  LV Ejection Fraction: 27%. LV Wall Motion: Global hypokinesis and severe LVE.  Olga Millers   Echo Study Conclusions from November 2015  - Left ventricle: Poorly visualized. The cavity size was normal. Systolic function was mildly reduced. The estimated ejection fraction was in the range of 45% to 50%. This is a gross estimation. - Mitral valve: There was mild regurgitation. - Left atrium: The atrium was mildly dilated. - Pericardium, extracardiac: A trivial pericardial effusion was identified posterior to the heart.  ULTRASOUND ABDOMEN COMPLETE  FINDINGS: Gallbladder: No gallstones or wall thickening visualized. No sonographic Murphy sign noted.  Common bile duct: Diameter: 3.6 mm  Liver: No focal lesion identified. Within normal limits in parenchymal echogenicity.  IVC: No abnormality visualized.  Pancreas: Visualized portion unremarkable.  Spleen: Size and appearance within normal limits.  Right Kidney: Length: 9.8 cm. Echogenicity within normal limits. No mass or  hydronephrosis visualized.  Left Kidney: Length: 11.5 cm. Echogenicity within normal limits. No mass or hydronephrosis visualized.  Abdominal aorta: Measures 2.5 cm.  Other findings: None.  IMPRESSION: 1. No acute findings. 2. Ectatic abdominal aorta at risk for aneurysm development. Recommend followup by ultrasound in 5 years. This recommendation follows ACR consensus guidelines: White Paper of the ACR Incidental Findings Committee II on Vascular Findings. J Am Coll Radiol 2013; 10:789-794.  Electronically Signed  By: Signa Kell M.D.  On: 04/25/2014 19:57     Assessment/Plan: 1. Dilated cardiomyopathy with EF 45%. She has no history of CAD but has a family history of CAD with her father having an MI in his 10's and has CHF. She reports a history of dyspnea associated with chest tightness but Myoview is abnormal - referring on for cardiac cath - The patient understands that risks include but are not limited to stroke (1 in 1000), death (1 in 1000), kidney failure [usually temporary] (1 in 500), bleeding (1 in 200), allergic reaction [possibly serious] (1 in 200), and agrees to proceed.   2.COPD  3. Worsening LV function - EF now at 27% per Myoview - cardiac cath to further define.  Procedure scheduled for tomorrow with Dr. Clifton James at 1:00PM. Will hold AM dose of Lasix tomorrow. Continue aspirin.  Current medicines are reviewed with the patient today.  The patient does not have concerns regarding medicines other than what has been noted above.  The following changes have been made:  See above.  Labs/ tests ordered today include:   No orders of the defined types were placed in this encounter.     Disposition:  Further disposition following cardiac cath.  Patient is agreeable to this plan and will call if any problems develop in the interim.   Signed: Rosalio Macadamia, RN, ANP-C 05/13/2014 2:06 PM  [facility] [address] [place], [place]  [address] Phone: [phone] Fax: [phone]

## 2014-05-14 ENCOUNTER — Encounter (HOSPITAL_COMMUNITY)
Admission: RE | Disposition: A | Discharge status: Home / Self Care | Payer: Self-pay | Source: Ambulatory Visit | Attending: Cardiovascular Disease

## 2014-05-14 ENCOUNTER — Encounter (HOSPITAL_COMMUNITY): Payer: Self-pay

## 2014-05-14 ENCOUNTER — Ambulatory Visit (HOSPITAL_COMMUNITY)
Admission: RE | Admit: 2014-05-14 | Discharge: 2014-05-14 | Disposition: A | Discharge status: Home / Self Care | Payer: BLUE CROSS/BLUE SHIELD | Source: Ambulatory Visit | Attending: Cardiovascular Disease | Admitting: Cardiovascular Disease

## 2014-05-14 DIAGNOSIS — I428 Other cardiomyopathies: Secondary | ICD-10-CM | POA: Diagnosis not present

## 2014-05-14 DIAGNOSIS — J449 Chronic obstructive pulmonary disease, unspecified: Secondary | ICD-10-CM | POA: Diagnosis not present

## 2014-05-14 DIAGNOSIS — E669 Obesity, unspecified: Secondary | ICD-10-CM | POA: Insufficient documentation

## 2014-05-14 DIAGNOSIS — I10 Essential (primary) hypertension: Secondary | ICD-10-CM | POA: Insufficient documentation

## 2014-05-14 DIAGNOSIS — Z7982 Long term (current) use of aspirin: Secondary | ICD-10-CM | POA: Insufficient documentation

## 2014-05-14 DIAGNOSIS — R079 Chest pain, unspecified: Secondary | ICD-10-CM | POA: Insufficient documentation

## 2014-05-14 DIAGNOSIS — Z79899 Other long term (current) drug therapy: Secondary | ICD-10-CM | POA: Diagnosis not present

## 2014-05-14 DIAGNOSIS — R931 Abnormal findings on diagnostic imaging of heart and coronary circulation: Secondary | ICD-10-CM

## 2014-05-14 DIAGNOSIS — Z87891 Personal history of nicotine dependence: Secondary | ICD-10-CM | POA: Insufficient documentation

## 2014-05-14 HISTORY — PX: CARDIAC CATHETERIZATION: SHX172

## 2014-05-14 HISTORY — PX: LEFT HEART CATHETERIZATION WITH CORONARY ANGIOGRAM: SHX5451

## 2014-05-14 SURGERY — LEFT HEART CATHETERIZATION WITH CORONARY ANGIOGRAM
Anesthesia: LOCAL

## 2014-05-14 MED ORDER — ASPIRIN 81 MG PO CHEW
81.0000 mg | CHEWABLE_TABLET | ORAL | Status: AC
  Administered 2014-05-14: 81 mg via ORAL

## 2014-05-14 MED ORDER — HEPARIN SODIUM (PORCINE) 1000 UNIT/ML IJ SOLN
INTRAMUSCULAR | Status: AC
  Filled 2014-05-14: qty 1

## 2014-05-14 MED ORDER — SODIUM CHLORIDE 0.9 % IJ SOLN: 3.0000 mL | Freq: Two times a day (BID) | INTRAMUSCULAR | Status: DC

## 2014-05-14 MED ORDER — HEPARIN (PORCINE) IN NACL 2-0.9 UNIT/ML-% IJ SOLN
INTRAMUSCULAR | Status: AC
  Filled 2014-05-14: qty 1000

## 2014-05-14 MED ORDER — LIDOCAINE HCL (PF) 1 % IJ SOLN
INTRAMUSCULAR | Status: AC
  Filled 2014-05-14: qty 30

## 2014-05-14 MED ORDER — SODIUM CHLORIDE 0.9 % IV SOLN: 250.0000 mL | INTRAVENOUS | Status: DC | PRN

## 2014-05-14 MED ORDER — SODIUM CHLORIDE 0.9 % IV SOLN
INTRAVENOUS | Status: DC
Start: 2014-05-15 — End: 2014-05-14

## 2014-05-14 MED ORDER — MIDAZOLAM HCL 2 MG/2ML IJ SOLN
INTRAMUSCULAR | Status: AC
Start: 2014-05-14 — End: 2014-05-14
  Filled 2014-05-14: qty 2

## 2014-05-14 MED ORDER — VERAPAMIL HCL 2.5 MG/ML IV SOLN
INTRAVENOUS | Status: AC
  Filled 2014-05-14: qty 2

## 2014-05-14 MED ORDER — DIAZEPAM 5 MG PO TABS
10.0000 mg | ORAL_TABLET | ORAL | Status: AC
  Administered 2014-05-14: 10 mg via ORAL

## 2014-05-14 MED ORDER — SODIUM CHLORIDE 0.9 % IJ SOLN: 3.0000 mL | INTRAMUSCULAR | Status: DC | PRN

## 2014-05-14 MED ORDER — ASPIRIN 81 MG PO CHEW
CHEWABLE_TABLET | ORAL | Status: AC
  Filled 2014-05-14: qty 1

## 2014-05-14 MED ORDER — SODIUM CHLORIDE 0.9 % IV SOLN
INTRAVENOUS | Status: AC
Start: 2014-05-14 — End: 2014-05-14

## 2014-05-14 MED ORDER — CARVEDILOL 6.25 MG PO TABS: 6.2500 mg | ORAL_TABLET | Freq: Two times a day (BID) | ORAL | Status: DC

## 2014-05-14 MED ORDER — DIAZEPAM 5 MG PO TABS
ORAL_TABLET | ORAL | Status: AC
  Filled 2014-05-14: qty 2

## 2014-05-14 MED ORDER — FENTANYL CITRATE 0.05 MG/ML IJ SOLN
INTRAMUSCULAR | Status: AC
  Filled 2014-05-14: qty 2

## 2014-05-14 MED ORDER — NITROGLYCERIN 1 MG/10 ML FOR IR/CATH LAB
INTRA_ARTERIAL | Status: AC
  Filled 2014-05-14: qty 10

## 2014-05-14 NOTE — CV Procedure (Signed)
   Cardiac Catheterization Procedure Note  Name: Judy [name] MRN: [id number] DOB: 11-16-1960  Procedure: Left Heart Cath, Selective Coronary Angiography, LV angiography  Indication:  Chest pain with abnormal stress test.  Medications:  Sedation:   2 mg IV Versed,  100 mcg IV Fentanyl  Contrast:  65 ml Omnipaque   Procedural Details: The right wrist was prepped, draped, and anesthetized with 1% lidocaine. Using the modified Seldinger technique, a 5 French sheath was introduced into the right radial artery. 3 mg of verapamil was administered through the sheath, weight-based unfractionated heparin was administered intravenously. A Jackie catheter was used for selective coronary angiography. A pigtail catheter was used for left ventriculography. Catheter exchanges were performed over an exchange length guidewire. There were no immediate procedural complications. A TR band was used for radial hemostasis at the completion of the procedure.  The patient was transferred to the post catheterization recovery area for further monitoring.  Procedural Findings:  Hemodynamics: AO:   115/10   mmHg LV:   114/12    mmHg LVEDP:  22  mmHg  Coronary angiography: Coronary dominance:  right   Left Main:   normal  Left Anterior Descending (LAD):   Normal in size with minor irregularities.  1st diagonal (D1):   normal  2nd diagonal (D2):   normal  3rd diagonal (D3):   normal  Circumflex (LCx):   Normal in size with no significant disease.  1st obtuse marginal:   High takeoff with no significant disease.  2nd obtuse marginal:   normal  3rd obtuse marginal:   Small in size and normal.   Right Coronary Artery:  Normal in size and dominant. The vessel has no significant disease.  Posterior descending artery:  normal  Posterior AV segment:  normal  Posterolateral branchs:   normal  Left ventriculography: Left ventricular systolic function is  Moderately to severely decreased , LVEF is  estimated at  30 %, there is  Mild mitral regurgitation   Final Conclusions:    1. No significant coronary artery disease  2. Moderate to severely reduced LV systolic function due to nonischemic cardiomyopathy.   3. Moderately elevated left ventricular end-diastolic pressure.  Recommendations:   medical therapy for nonischemic cardiomyopathy. I switched verapamil to carvedilol. Recommend starting treatment with an ACE inhibitor/ARB upon follow-up. Consider screening for sleep apnea.  Lorine Bears MD, [facility] 05/14/2014, 2:48 PM

## 2014-05-14 NOTE — Discharge Instructions (Signed)
Stop taking Verapamil Start Coreg 6.25 mg bid.     Radial Site Care Refer to this sheet in the next few weeks. These instructions provide you with information on caring for yourself after your procedure. Your caregiver may also give you more specific instructions. Your treatment has been planned according to current medical practices, but problems sometimes occur. Call your caregiver if you have any problems or questions after your procedure. HOME CARE INSTRUCTIONS  You may shower the day after the procedure.Remove the bandage (dressing) and gently wash the site with plain soap and water.Gently pat the site dry.  Do not apply powder or lotion to the site.  Do not submerge the affected site in water for 3 to 5 days.  Inspect the site at least twice daily.  Do not flex or bend the affected arm for 24 hours.  No lifting over 5 pounds (2.3 kg) for 5 days after your procedure.  Do not drive home if you are discharged the same day of the procedure. Have someone else drive you.  You may drive 24 hours after the procedure unless otherwise instructed by your caregiver.  Do not operate machinery or power tools for 24 hours.  A responsible adult should be with you for the first 24 hours after you arrive home. What to expect:  Any bruising will usually fade within 1 to 2 weeks.  Blood that collects in the tissue (hematoma) may be painful to the touch. It should usually decrease in size and tenderness within 1 to 2 weeks. SEEK IMMEDIATE MEDICAL CARE IF:  You have unusual pain at the radial site.  You have redness, warmth, swelling, or pain at the radial site.  You have drainage (other than a small amount of blood on the dressing).  You have chills.  You have a fever or persistent symptoms for more than 72 hours.  You have a fever and your symptoms suddenly get worse.  Your arm becomes pale, cool, tingly, or numb.  You have heavy bleeding from the site. Hold pressure on the  site. Document Released: 03/05/2010 Document Revised: 04/25/2011 Document Reviewed: 03/05/2010 [facility] Patient Information 2015 [place], [place]. This information is not intended to replace advice given to you by your health care provider. Make sure you discuss any questions you have with your health care provider.

## 2014-05-14 NOTE — Interval H&P Note (Signed)
History and Physical Interval Note:  05/14/2014 2:15 PM  Judy [name]  has presented today for surgery, with the diagnosis of abnormal myoview  The various methods of treatment have been discussed with the patient and family. After consideration of risks, benefits and other options for treatment, the patient has consented to  Procedure(s): LEFT HEART CATHETERIZATION WITH CORONARY ANGIOGRAM (N/A) as a surgical intervention .  The patient's history has been reviewed, patient examined, no change in status, stable for surgery.  I have reviewed the patient's chart and labs.  Questions were answered to the patient's satisfaction.     Lorine Bears

## 2014-05-14 NOTE — H&P (View-Only) (Signed)
   CARDIOLOGY OFFICE NOTE  Date:  05/13/2014    Judy [name] Date of Birth: 12/10/1960 Medical Record #[id number]  PCP:  COX,KIRSTEN, MD  Cardiologist:  Turner  Chief Complaint  Patient presents with  . Follow up visit after abnormal Myoview    Seen for Dr. Turner.      History of Present Illness: Judy [name] is a 53 y.o. female who presents today for a follow up visit after having abnormal Myoview. She was referred here earlier this month for evaluation of reduced LVF.She is seen for Dr. Turner.  She is followed by Dr. Wert for COPD and recently had a COPD exacerbation with increased cough productive of green sputum and was treated with antibiotic, steoids and inhalers.   She is on home O2 at night. She has chronic LE edema. She is a former smoker. She is obese.   Dr. Turner arranged for Myoview due to +FH for CAD - see below. When she was seen here for her first evaluation, she had the abrupt onset of abdominal pain - ending up going by EMS to the ER - had an abdominal US - see below.   Comes in today. Here with her husband. She is pretty tearful. She has had shortness of breath with an associated chest tightness - this has been going on since September. She is not lightheaded or dizzy. Some swelling. Struggles with her weight. No regular exercise. No syncope noted. Some palpitations at times.   Past Medical History  Diagnosis Date  . COPD (chronic obstructive pulmonary disease)   . Hypertension   . LV dysfunction   . Obesity   . Former tobacco use   . Scoliosis     Past Surgical History  Procedure Laterality Date  . Harrington rod scoliosis      1981  . Fasciotomy      1993  . Umbilical hernia repair    . Vesicovaginal fistula closure w/ tah    . Carpal tunnel release    . Artery repair    . Rotator cuff repair    . Ankle artery involved cyst removal       Medications: Current Outpatient Prescriptions  Medication Sig Dispense Refill  . Aclidinium  Bromide (TUDORZA PRESSAIR) 400 MCG/ACT AEPB Inhale 1 puff into the lungs 2 (two) times daily. 1 each 5  . albuterol (PROVENTIL) (2.5 MG/3ML) 0.083% nebulizer solution Take 2.5 mg by nebulization every 4 (four) hours as needed for wheezing or shortness of breath (((PLAN C))).    . aspirin 81 MG tablet Take 81 mg by mouth at bedtime.     . Cholecalciferol (VITAMIN D-3) 1000 UNITS CAPS Take 1 capsule by mouth daily.    . clonazePAM (KLONOPIN) 1 MG tablet Take 0.5 mg by mouth at bedtime.     . famotidine (PEPCID) 20 MG tablet Take 20 mg by mouth at bedtime.    . furosemide (LASIX) 20 MG tablet Take 1 tablet (20 mg total) by mouth daily. 30 tablet 0  . gabapentin (NEURONTIN) 400 MG capsule Take 800 mg by mouth at bedtime.     . Lactobacillus (ACIDOPHILUS) CAPS capsule Take 1 capsule by mouth daily.    . Lutein-Zeaxanthin 25-5 MG CAPS Take 1 capsule by mouth at bedtime.     . Multiple Vitamin (MULTIVITAMIN WITH MINERALS) TABS tablet Take 1 tablet by mouth daily.    . omeprazole (PRILOSEC) 20 MG capsule Take 20 mg by mouth every morning.     .   oxyCODONE-acetaminophen (PERCOCET) 7.5-325 MG per tablet Take 1 tablet by mouth 2 (two) times daily as needed for pain (for pain).    . tiotropium (SPIRIVA) 18 MCG inhalation capsule Place 18 mcg into inhaler and inhale daily.    . topiramate (TOPAMAX) 50 MG tablet Take 50 mg by mouth at bedtime.    . venlafaxine XR (EFFEXOR-XR) 150 MG 24 hr capsule Take 150 mg by mouth daily with breakfast.    . venlafaxine XR (EFFEXOR-XR) 75 MG 24 hr capsule Take 75 mg by mouth daily with breakfast.    . verapamil (CALAN-SR) 120 MG CR tablet Take 120 mg by mouth 2 (two) times daily.     No current facility-administered medications for this visit.    Allergies: Allergies  Allergen Reactions  . Demerol [Meperidine] Nausea And Vomiting    Social History: The patient  reports that she quit smoking about 9 years ago. Her smoking use included Cigarettes. She has a 15  pack-year smoking history. She has never used smokeless tobacco. She reports that she drinks alcohol. She reports that she does not use illicit drugs.   Family History: The patient's family history includes Allergies in her father; Emphysema in her father; Heart disease (age of onset: 55) in her father; Heart failure in her father.   Review of Systems: Please see the history of present illness.   Otherwise, the review of systems is positive for dyspnea, leg swelling. DOE, visual disturbance, depression, back pain, muscle pain, easy bruising, excessive sweating, irregular heart beats, snoring, constipation, anxiety, balance issues and headaches.   All other systems are reviewed and negative.   Physical Exam: VS:  BP 102/76 mmHg  Pulse 70  Ht 5' 5" (1.651 m)  Wt 234 lb (106.142 kg)  BMI 38.94 kg/m2 .  BMI Body mass index is 38.94 kg/(m^2).  Wt Readings from Last 3 Encounters:  05/13/14 234 lb (106.142 kg)  05/08/14 235 lb (106.595 kg)  04/25/14 243 lb 1.9 oz (110.279 kg)    General: Pleasant. Well developed, well nourished and in no acute distress. She is morbidly obese.  HEENT: Normal. Neck: Supple, no JVD, carotid bruits, or masses noted.  Cardiac: Regular rate and rhythm. No murmurs, rubs, or gallops. No edema.  Respiratory:  Lungs are clear to auscultation bilaterally with normal work of breathing.  GI: Soft and nontender.  MS: No deformity or atrophy. Gait and ROM intact. Skin: Warm and dry. Color is normal.  Neuro:  Strength and sensation are intact and no gross focal deficits noted.  Psych: Alert, appropriate and with normal affect.   LABORATORY DATA:  EKG:  EKG is not ordered today.   Lab Results  Component Value Date   WBC 15.8* 04/25/2014   HGB 13.6 04/25/2014   HCT 42.2 04/25/2014   PLT 304 04/25/2014   GLUCOSE 102* 04/25/2014   ALT 22 04/25/2014   AST 22 04/25/2014   NA 142 04/25/2014   K 4.0 04/25/2014   CL 106 04/25/2014   CREATININE 0.86 04/25/2014   BUN  13 04/25/2014   CO2 28 04/25/2014   TSH 1.68 10/08/2013    BNP (last 3 results) No results for input(s): BNP in the last 8760 hours.  ProBNP (last 3 results)  Recent Labs  10/08/13 1300 01/08/14 1022 04/10/14 1029  PROBNP 452.0* 435.0* 498.0*     Other Studies Reviewed Today:  Myoview Impression from March 2016 Exercise Capacity: Lexiscan with no exercise. BP Response: Normal blood pressure response. Clinical Symptoms: There   is dyspnea and neck tightness. ECG Impression: No significant ST segment change suggestive of ischemia. Comparison with Prior Nuclear Study: No previous nuclear study performed  Overall Impression: High risk stress nuclear study with moderate size, severe intensity, partially reversible mid anterior, inferior and apical defects consistent with prior infarct and mild peri-infarct ischemia; study high risk due to reduced LV function.  LV Ejection Fraction: 27%. LV Wall Motion: Global hypokinesis and severe LVE.  Brian Crenshaw   Echo Study Conclusions from November 2015  - Left ventricle: Poorly visualized. The cavity size was normal. Systolic function was mildly reduced. The estimated ejection fraction was in the range of 45% to 50%. This is a gross estimation. - Mitral valve: There was mild regurgitation. - Left atrium: The atrium was mildly dilated. - Pericardium, extracardiac: A trivial pericardial effusion was identified posterior to the heart.  ULTRASOUND ABDOMEN COMPLETE  FINDINGS: Gallbladder: No gallstones or wall thickening visualized. No sonographic Murphy sign noted.  Common bile duct: Diameter: 3.6 mm  Liver: No focal lesion identified. Within normal limits in parenchymal echogenicity.  IVC: No abnormality visualized.  Pancreas: Visualized portion unremarkable.  Spleen: Size and appearance within normal limits.  Right Kidney: Length: 9.8 cm. Echogenicity within normal limits. No mass or  hydronephrosis visualized.  Left Kidney: Length: 11.5 cm. Echogenicity within normal limits. No mass or hydronephrosis visualized.  Abdominal aorta: Measures 2.5 cm.  Other findings: None.  IMPRESSION: 1. No acute findings. 2. Ectatic abdominal aorta at risk for aneurysm development. Recommend followup by ultrasound in 5 years. This recommendation follows ACR consensus guidelines: White Paper of the ACR Incidental Findings Committee II on Vascular Findings. J Am Coll Radiol 2013; 10:789-794.  Electronically Signed  By: Taylor Stroud M.D.  On: 04/25/2014 19:57     Assessment/Plan: 1. Dilated cardiomyopathy with EF 45%. She has no history of CAD but has a family history of CAD with her father having an MI in his 50's and has CHF. She reports a history of dyspnea associated with chest tightness but Myoview is abnormal - referring on for cardiac cath - The patient understands that risks include but are not limited to stroke (1 in 1000), death (1 in 1000), kidney failure [usually temporary] (1 in 500), bleeding (1 in 200), allergic reaction [possibly serious] (1 in 200), and agrees to proceed.   2.COPD  3. Worsening LV function - EF now at 27% per Myoview - cardiac cath to further define.  Procedure scheduled for tomorrow with Dr. McAlhany at 1:00PM. Will hold AM dose of Lasix tomorrow. Continue aspirin.  Current medicines are reviewed with the patient today.  The patient does not have concerns regarding medicines other than what has been noted above.  The following changes have been made:  See above.  Labs/ tests ordered today include:   No orders of the defined types were placed in this encounter.     Disposition:  Further disposition following cardiac cath.  Patient is agreeable to this plan and will call if any problems develop in the interim.   Signed: Johnn Krasowski C. Caragh Gasper, RN, ANP-C 05/13/2014 2:06 PM  [facility] [address] [place], [place]  [address] Phone: [phone] Fax: [phone]         

## 2014-05-16 ENCOUNTER — Other Ambulatory Visit: Payer: Self-pay

## 2014-05-16 MED ORDER — ATORVASTATIN CALCIUM 20 MG PO TABS: 20.0000 mg | ORAL_TABLET | Freq: Every day | ORAL | Status: DC

## 2014-05-20 ENCOUNTER — Telehealth: Payer: Self-pay | Admitting: Cardiology

## 2014-05-20 NOTE — Telephone Encounter (Signed)
Called pt to verify pharmacy Contacted pharmacy to verify prescription was received from [place].  Spoke with pharmacy representative and they do have medication in stock.  Pt called back and made aware medication is ready for pick up at the pharmacy.

## 2014-05-20 NOTE — Telephone Encounter (Signed)
New problem   Pt stated she went to pick up her cholestrol medication and it hadn't been called in as of yet. Please advise pt.

## 2014-05-22 ENCOUNTER — Encounter: Payer: Self-pay | Admitting: Internal Medicine

## 2014-05-22 ENCOUNTER — Ambulatory Visit (INDEPENDENT_AMBULATORY_CARE_PROVIDER_SITE_OTHER): Payer: BLUE CROSS/BLUE SHIELD | Admitting: Internal Medicine

## 2014-05-22 VITALS — BP 106/70 | Ht 65.0 in | Wt 237.0 lb

## 2014-05-22 DIAGNOSIS — R918 Other nonspecific abnormal finding of lung field: Secondary | ICD-10-CM

## 2014-05-22 DIAGNOSIS — J449 Chronic obstructive pulmonary disease, unspecified: Secondary | ICD-10-CM

## 2014-05-22 NOTE — Progress Notes (Signed)
Subjective:     Patient ID: Judy [name], female   DOB: Jan 12, 1961,  MRN: [id number]    Brief patient profile:  53 yowf quit smoking 2007  @  150 lb  with cough that resolved and no resp problems until winter 2015 with doe x walking the dog s much in terms of cough then 10/04/13 sudden sense she couldn't get a breath and coughed up blood x one tsp plus slt green mucus  > better with saba and started on inhalers/ abx  >  better and pred taper and dx of GOLD II copd was made 11/2013 and non ischemic cardiomyopathy 05/14/14.   History of Present Illness  10/08/2013 1st [facility] office visit/ Zamar Odwyer Chief Complaint  Patient presents with  . Pulmonary Consult    Referred by Penni Homans Cox-sob with exertion x 6-8 mths.,worse since 4 days ago,occass. cough-tsp. of blood 4 days ago,usually unprod.,no wheezing,midchest tightness,no fcs,Had neb. since yesterday(used 2x yesterday)   baseline = 164ft   to mailbox and sob when gets there.  rec Clonidine 0.1 mg twice daily until you return  Plan A = automatic = symbiocort 160 Take 2 puffs first thing in am and then another 2 puffs about 12 hours later.  Plan B = Only use your albuterol (proair)as a rescue medication    Plan C = nebulizer albuterol, ok to use up to every 4 hours if can't get relief from Plan B GERD diet                01/08/2014 acute ov/Judy [name] re:  uneplained sob/ cough  Chief Complaint  Patient presents with  . Follow-up    SOB cough productive red greenish mucus   better p last ov but then noticed recurrent  sob walking dogs despite symbicort 160 w/in a week or so of ov then then much worse with bad cough green/bloody mucus x 5 days rx with change to anoro no better so far rec Please see patient coordinator before you leave today  to schedule CTa of chest and Sinus CT asap and echo as well  Please remember to go to the lab  department downstairs for your tests - we will call you with the results when they are  available. Start back on symbicort 160 Take 2 puffs first thing in am and then another 2 puffs about 12 hours later.  Try prilosec 20mg   Take 30-60 min before first meal of the day and Pepcid 20 mg one bedtime until return Augmentin 875 mg take one pill twice daily  X 10 days  No aspirin with any bleeding - hold until no blood x 3 straight days Please schedule a follow up office visit in 2 weeks, sooner if needed with all active meds in hand Late add Prednisone 10 mg take  4 each am x 2 days,   2 each am x 2 days,  1 each am x 2 days and stop      02/21/2014 Acute OV/NP  Patient presents for an acute office visit for an acute office visit. She complains over the last 2 days that she has had increased cough, congestion with thick green mucus, and this morning started to see some blood mixed into her mucus. She denies any frank hemoptysis.. She denies any fever, chest pain, orthopnea, PND, or increased leg swelling Appetite is good with no nausea, vomiting or diarrhea Patient is planning to leave for a trip to [place] for a family wedding next week. Chest  x-ray today shows a right lower lobe airspace disease. Patient has chronic back pain from scoliosis and previous surgery She is on chronic narcotics with Opana and Fioricet as needed. She does use gabapentin 400 mg 2-3 times a day. She denies any dysphagia, heartburn. CT chest in 01/08/2014 showed persistent groundglass infiltrates along the lower lobes and right middle and upper lobe. CT sinus without any acute disease CBC in November showed normal eosinophil count. Previous sedimentation rate 35. rec Levaquin  daily for 7 days  Mucinex DM Twice daily  As needed  Cough/congestion  Fluids and rest  No eating 3 hr before bedtime.     02/26/2014 f/u ov/Judy [name] re: f/ u ? pna inpt with copd GOLD II maint on symbicort 160 2bid  Chief Complaint  Patient presents with  . Follow-up    cxr done today. Pt states that her breathing is doing  some better. Her cough is some better- still producing some sputum- clear and frothy.   No more green or bloody sputum Still a little sob and slt prod cough  Esp in am but nothing purulent or bloody  No need for any rescue rx  rec If cough/ wheeze / short of breath worsen : Prednisone 10 mg take  4 each am x 2 days,   2 each am x 2 days,  1 each am x 2 days and stop  Call with formulary alternatives to symbicort 160 2 every 12 hours (dulera should be one option)   - 03/07/2014 recurrent hemoptysis rec  HRCT chest 03/13/2014 > Significant improvement in the appearance of the lung parenchyma compared to the prior examination ? Boop?    03/27/2014 f/u ov/Judy [name] re: GOLD II copd with superimposed AS dz ? alv hem ? Boop/resolved  Chief Complaint  Patient presents with  . Follow-up    Pt states woke up this am with increased SOB and prod cough with green sputum.    doing rehab completely flat x stops p 5 min to let 02 recover Not consistent with resp meds / insurance formulary restrictions not clear  Not using saba prn in any form. > Continue on [place] and Tudorza    04/10/2014 Follow up : GOLD II COPD w/ ? BOOP resolved Patient returns for a two-week follow-up and medication review We reviewed all her medications organize them into a medication count with patient education Appears that she is taking her medications correctly Patient has briefly been in pulmonary rehabilitation with notable drops in her oxygen level with exercise Today in the office, patient with desaturation to 88% with walking room air. Previous 2-D echo in November 2015 showed EF at 45-50%, with elevated BNP. Around 400 We discussed a cardiology referral for evaluation of possible underlying congestive heart failure. Patient does complain that she snores and has daytime sleepiness, we discussed checking her oxygen level while she was sleeping If positive explained that she may need a sleep test in the future. She denies  any chest pain, palpitations, syncope, abdominal pain, vomiting, or increased leg swelling Patient had previous COPD exacerbation versus possible Boop with productive cough and hemoptysis. CT chest in 01/08/2014 showed persistent groundglass infiltrates along the lower lobes and right middle and upper lobe. Previous sedimentation rate 35. She was treated with steroids and antibiotics Symptoms improved and repeat CT in January show significant improvement in aeration and decreased ground glass attenuation. rec Follow med calendar closely and bring to each visit Labs today We are setting, you up for  a overnight oximetry test We are referring you to cardiology for abnormal echo Begin oxygen 2 L with activity.    LHC 05/14/14  1. No significant coronary artery disease 2. Moderate to severely reduced LV systolic function due to nonischemic cardiomyopathy.  3. Moderately elevated left ventricular end-diastolic pressure.    05/22/2014 f/u ov/Judy [name] re: confused with med names/ taking spiriva bid /not using med calendar provided Chief Complaint  Patient presents with  . Follow-up    Pt states that her breathing has been doing well. She has not needed her rescue inhaler or neb.    Only uses 02 with exertion/ back stops her before the breathing and when exerts uses  2lpm  On recumbent bike does 7 min drops to 90 then puts on 02   No obvious day to day or daytime variabilty or assoc chronic cough or cp or chest tightness, subjective wheeze overt sinus or hb symptoms. No unusual exp hx or h/o childhood pna/ asthma or knowledge of premature birth.  Sleeping ok without nocturnal  or early am exacerbation  of respiratory  c/o's or need for noct saba. Also denies any obvious fluctuation of symptoms with weather or environmental changes or other aggravating or alleviating factors except as outlined above   Current Medications, Allergies, Complete Past Medical History, Past Surgical History, Family  History, and Social History were reviewed in [employer] record.  ROS  The following are not active complaints unless bolded sore throat, dysphagia, dental problems, itching, sneezing,  nasal congestion or excess/ purulent secretions, ear ache,   fever, chills, sweats, unintended wt loss, pleuritic or exertional cp, hemoptysis,  orthopnea pnd or leg swelling, presyncope, palpitations, heartburn, abdominal pain, anorexia, nausea, vomiting, diarrhea  or change in bowel or urinary habits, change in stools or urine, dysuria,hematuria,  rash, arthralgias, visual complaints, headache, numbness weakness or ataxia or problems with walking or coordination,  change in mood/affect or memory.                  Objective:   Physical Exam  amb wf nad, morbidly obese   11/27/2013     237 >   01/08/2014  236 > 01/21/2014 241 >235 02/21/2014 > 02/26/2014  232 > 03/27/2014  236 >236 04/10/2014 > 05/22/2014   237    HEENT: nl dentition, turbinates, and orophanx. Nl external ear canals without cough reflex   NECK :  without JVD/Nodes/TM/ nl carotid upstrokes bilaterally   LUNGS: no acc muscle use, clear to A and P bilaterally without cough on insp or exp maneuvers   CV:  RRR  no s3 or murmur or increase in P2, tr  edema   ABD:  soft and nontender with nl excursion in the supine position. No bruits or organomegaly, bowel sounds nl  MS:  warm without deformities, calf tenderness, cyanosis or clubbing  SKIN: warm and dry without lesions          Assessment:

## 2014-05-22 NOTE — Patient Instructions (Signed)
spiriva is like high octane fuel for a car and you may not need it now   If breathing gets worse ok to use symbicort 160 up to 12hour as needed and restart spiriva as a maintenance medication  See calendar for specific medication instructions and bring it back for each and every office visit for every healthcare provider you see.  Without it,  you may not receive the best quality medical care that we feel you deserve.  You will note that the calendar groups together  your maintenance  medications that are timed at particular times of the day.  Think of this as your checklist for what your doctor has instructed you to do until your next evaluation to see what benefit  there is  to staying on a consistent group of medications intended to keep you well.  The other group at the bottom is entirely up to you to use as you see fit  for specific symptoms that may arise between visits that require you to treat them on an as needed basis.  Think of this as your action plan or "what if" list.   Separating the top medications from the bottom group is fundamental to providing you adequate care going forward.    Please schedule a follow up visit in 3 months but call sooner if needed

## 2014-05-23 ENCOUNTER — Other Ambulatory Visit: Payer: Self-pay

## 2014-05-23 DIAGNOSIS — E785 Hyperlipidemia, unspecified: Secondary | ICD-10-CM

## 2014-05-23 DIAGNOSIS — R899 Unspecified abnormal finding in specimens from other organs, systems and tissues: Secondary | ICD-10-CM

## 2014-05-26 ENCOUNTER — Encounter: Payer: Self-pay | Admitting: Internal Medicine

## 2014-05-26 NOTE — Assessment & Plan Note (Signed)
-   11/27/2013  PFTs   FEV1  1.51 (54%) ratio 52 and and no better p B2 and DLCO  71 - 01/08/2014  p extensive coaching HFA effectiveness =    90% > rec resume symbicort 160 2bid  - 03/27/14 trial of dulera 200/tudorza samples only to assure adherence  04/10/2014 med calendar, Alpha 1 MM , nml level   I had an extended discussion with the patient reviewing all relevant studies completed to date and  lasting 15 to 20 minutes of a 25 minute visit on the following ongoing concerns:   Clearly not that severe and now we know she has a cardiomyopathy that explains her alveolar filling/ hemopytisis     Try off spiriva and just use symbicort on a prn basis     Each maintenance medication was reviewed in detail including most importantly the difference between maintenance and as needed and under what circumstances the prns are to be used. This was done in the context of a medication calendar review which provided the patient with a user-friendly unambiguous mechanism for medication administration and reconciliation and provides an action plan for all active problems. It is critical that this be shown to every doctor  for modification during the office visit if necessary so the patient can use it as a working document.

## 2014-05-26 NOTE — Assessment & Plan Note (Addendum)
See CT chest 10/07/13 ? Bronchopna/ blood/edema? - CTa 01/08/14 > New bilateral small pleural effusions. Persistent and increased infiltrative change particularly in the lower lobes bilaterally but also within the right middle and upper lobes. This is most prominent in the lower lobes bilaterally best seen on image number 71. This likely represents some waxing and waning atypical pneumonia. - 03/07/2014 recurrent hemoptysis rec  HRCT chest 03/13/2014 > Significant improvement in the appearance of the lung parenchyma compared to the prior examination ? Boop?  - dx cardiomyopathy 05/14/14 c/w alveolar edema vs blood from CHF > in retrospect prob also had element of cardiac asthma as the severity of her copd never really correlated with her symptoms   rec cards f/u planned/wean copd meds if tolerates

## 2014-06-10 ENCOUNTER — Encounter: Payer: Self-pay | Admitting: Cardiology

## 2014-06-10 ENCOUNTER — Ambulatory Visit (INDEPENDENT_AMBULATORY_CARE_PROVIDER_SITE_OTHER): Payer: BLUE CROSS/BLUE SHIELD | Admitting: Cardiology

## 2014-06-10 VITALS — BP 120/84 | HR 82 | Ht 65.0 in | Wt 239.8 lb

## 2014-06-10 DIAGNOSIS — I420 Dilated cardiomyopathy: Secondary | ICD-10-CM | POA: Diagnosis not present

## 2014-06-10 DIAGNOSIS — J449 Chronic obstructive pulmonary disease, unspecified: Secondary | ICD-10-CM | POA: Diagnosis not present

## 2014-06-10 DIAGNOSIS — I10 Essential (primary) hypertension: Secondary | ICD-10-CM | POA: Diagnosis not present

## 2014-06-10 DIAGNOSIS — R9430 Abnormal result of cardiovascular function study, unspecified: Secondary | ICD-10-CM | POA: Diagnosis not present

## 2014-06-10 MED ORDER — LISINOPRIL 10 MG PO TABS: 10.0000 mg | ORAL_TABLET | Freq: Every day | ORAL | Status: DC

## 2014-06-10 NOTE — Progress Notes (Signed)
Cardiology Office Note   Date:  06/10/2014   ID:  Judy [name], DOB 03-Aug-1960, MRN [id number]  PCP:  Blane Ohara, MD    No chief complaint on file.     History of Present Illness: Judy [name] is a 54 y.o. female who presents for followup of reduced LVF with EF 45%. She is followed by Dr. Sherene Sires for COPD. She also has had some palpitations in the past but denies any chest pressure or pain. She is on home O2 at night. She has chronic LE edema. She has never had any heart problems in the past except for being told she has a heart murmur as a child. She had a nuclear stress test in March which was abnormal and underwent cardiac cath showing no significant coronary disease, moderate to severely reduced LVF c/w nonischemic DCM and elevetd LVEDP.  She was changed from Verapamil to Carvedilol.  She no presents back for followup.  She denies any chest pain but does have some SOB with walking.  She recently went on a cruise and got a URI.  She has a residual sore throat.  She denies any fever or chills.  She is currently on antibiotics after getting IV antibiotics on the ship.  She denies any LE edema.    Past Medical History  Diagnosis Date  . COPD (chronic obstructive pulmonary disease)   . Hypertension   . LV dysfunction   . Obesity   . Former tobacco use   . Scoliosis     Past Surgical History  Procedure Laterality Date  . Harrington rod scoliosis      1981  . Fasciotomy      1993  . Umbilical hernia repair    . Vesicovaginal fistula closure w/ tah    . Carpal tunnel release    . Artery repair    . Rotator cuff repair    . Ankle artery involved cyst removal    . Abdominal hysterectomy    . Left heart catheterization with coronary angiogram N/A 05/14/2014    Procedure: LEFT HEART CATHETERIZATION WITH CORONARY ANGIOGRAM;  Surgeon: Iran Ouch, MD;  Location: [facility]ATH [facility];  Service: Cardiovascular;  Laterality: N/A;     Current Outpatient Prescriptions    Medication Sig Dispense Refill  . albuterol (PROVENTIL) (2.5 MG/3ML) 0.083% nebulizer solution Take 2.5 mg by nebulization every 4 (four) hours as needed for wheezing or shortness of breath (((PLAN C))).    Marland Kitchen aspirin 81 MG tablet Take 81 mg by mouth at bedtime.     Marland Kitchen atorvastatin (LIPITOR) 20 MG tablet Take 1 tablet (20 mg total) by mouth daily at 6 PM. 90 tablet 3  . carvedilol (COREG) 6.25 MG tablet Take 1 tablet (6.25 mg total) by mouth 2 (two) times daily with a meal. 60 tablet 2  . Cholecalciferol (VITAMIN D-3) 1000 UNITS CAPS Take 1 capsule by mouth daily.    . clonazePAM (KLONOPIN) 1 MG tablet Take 0.5 mg by mouth at bedtime.     . famotidine (PEPCID) 20 MG tablet Take 20 mg by mouth at bedtime.    . furosemide (LASIX) 20 MG tablet Take 1 tablet (20 mg total) by mouth daily. 30 tablet 0  . Gabapentin, Once-Daily, 300 & 600 MG MISC Take 600 mg by mouth at bedtime. Pt will start taking 1800 mg by mouth at bedtime.    . Lactobacillus (ACIDOPHILUS) CAPS capsule Take 1 capsule by mouth daily.    Marland Kitchen levofloxacin (LEVAQUIN)  500 MG tablet Take 500 mg by mouth daily. Pt takes 1 1/2 tablets by mouth daily    . Lutein-Zeaxanthin 25-5 MG CAPS Take 1 capsule by mouth at bedtime.     . methylPREDNISolone (MEDROL) 4 MG tablet Take 4 mg by mouth daily.    . Multiple Vitamin (MULTIVITAMIN WITH MINERALS) TABS tablet Take 1 tablet by mouth daily.    Marland Kitchen omeprazole (PRILOSEC) 20 MG capsule Take 20 mg by mouth every morning.     . OxyCODONE HCl 7.5 MG TABA Take 7.5 mg by mouth as needed.    . tiotropium (SPIRIVA) 18 MCG inhalation capsule Place 18 mcg into inhaler and inhale daily.    Marland Kitchen topiramate (TOPAMAX) 50 MG tablet Take 50 mg by mouth at bedtime.    Marland Kitchen venlafaxine XR (EFFEXOR-XR) 150 MG 24 hr capsule Take 150 mg by mouth daily with breakfast.    . venlafaxine XR (EFFEXOR-XR) 75 MG 24 hr capsule Take 75 mg by mouth daily with breakfast.    . lisinopril (PRINIVIL,ZESTRIL) 10 MG tablet Take 1 tablet (10 mg  total) by mouth daily. 30 tablet 6   No current facility-administered medications for this visit.    Allergies:   Demerol    Social History:  The patient  reports that she quit smoking about 9 years ago. Her smoking use included Cigarettes. She has a 15 pack-year smoking history. She has never used smokeless tobacco. She reports that she drinks alcohol. She reports that she does not use illicit drugs.   Family History:  The patient's family history includes Allergies in her father; Emphysema in her father; Heart disease (age of onset: 59) in her father; Heart failure in her father.    ROS:  Please see the history of present illness.   Otherwise, review of systems are positive for none.   All other systems are reviewed and negative.    PHYSICAL EXAM: VS:  BP 120/84 mmHg  Pulse 82  Ht 5\' 5"  (1.651 m)  Wt 239 lb 12.8 oz (108.773 kg)  BMI 39.90 kg/m2  SpO2 96% , BMI Body mass index is 39.9 kg/(m^2). GEN: Well nourished, well developed, in no acute distress HEENT: normal Neck: no JVD, carotid bruits, or masses Cardiac: RRR; no murmurs, rubs, or gallops,no edema  Respiratory:  clear to auscultation bilaterally, normal work of breathing GI: soft, nontender, nondistended, + BS MS: no deformity or atrophy Skin: warm and dry, no rash Neuro:  Strength and sensation are intact Psych: euthymic mood, full affect   EKG:  EKG is not ordered today.    Recent Labs: 10/08/2013: TSH 1.68 04/10/2014: Pro B Natriuretic peptide (BNP) 498.0* 05/13/2014: ALT 19; BUN 19; Creatinine 0.76; Hemoglobin 14.1; Platelets 317.0; Potassium 3.6; Sodium 140    Lipid Panel    Component Value Date/Time   CHOL 266* 05/13/2014 1432   TRIG 166.0* 05/13/2014 1432   HDL 53.90 05/13/2014 1432   CHOLHDL 5 05/13/2014 1432   VLDL 33.2 05/13/2014 1432   LDLCALC 179* 05/13/2014 1432      Wt Readings from Last 3 Encounters:  06/10/14 239 lb 12.8 oz (108.773 kg)  05/22/14 237 lb (107.502 kg)  05/14/14 234 lb  (106.142 kg)       ASSESSMENT AND PLAN:  1.  Nonischemic DCM EF 27% on nuclear stress test, 45% on echo and 30% on cath with no CAD on recent cath.  She will continue BB.  Add Lisinopril 10mg  daily.  Check BMET in 1 week.  Cardiac MRI in 3 months to reassess LVF on medical therapy.  I will get an MRI since there was such a discrepancy on EF between 3 different studies.  I will check for secondary causes of DCM with a ferritin level and serum protein electrophoresis.   2.  Elevated LVEDP at cath - continue Lasix.  She appears euvolemic.   3.  COPD followed by pulmonary 4.  HTN - controlled on BB 5.  Dyslipidemia with last LDL 179 - continue statin - recheck FLP and ALT in 4 weeks   Current medicines are reviewed at length with the patient today.  The patient does not have concerns regarding medicines.  The following changes have been made:  no change  Labs/ tests ordered today include: see above assessment and plan  Orders Placed This Encounter  Procedures  . Basic Metabolic Panel (BMET)  . Hepatic function panel  . Lipid panel  . Ferritin  . Protein Electrophoresis, (serum)  . 2D Echocardiogram with contrast     Disposition:   FU with me in 3 months   Signed, Quintella Reichert, MD  06/10/2014 4:46 PM    [facility] [address] [place], [place], [place]  [address] Phone: [phone]; Fax: [phone]

## 2014-06-10 NOTE — Patient Instructions (Signed)
Medication Instructions:  Your physician has recommended you make the following change in your medication:  1) START LISINOPRIL 10 mg daily  Labwork: Your physician recommends that you return for lab work in ONE WEEK (BMET, ferritin, serum protein)  Your physician recommends that you return for FASTING lab work in ONE MONTH (lipids, LFTs)  Testing/Procedures: Your physician has requested that you have an echocardiogram ONE WEEK BEFORE OFFICE VISIT IN 3 MONTHS. Echocardiography is a painless test that uses sound waves to create images of your heart. It provides your doctor with information about the size and shape of your heart and how well your heart's chambers and valves are working. This procedure takes approximately one hour. There are no restrictions for this procedure.   Follow-Up: Your physician recommends that you schedule a follow-up appointment in: 3 months with Dr. Mayford Knife.   Any Other Special Instructions Will Be Listed Below (If Applicable).

## 2014-06-16 ENCOUNTER — Ambulatory Visit (INDEPENDENT_AMBULATORY_CARE_PROVIDER_SITE_OTHER): Payer: BLUE CROSS/BLUE SHIELD | Admitting: Pulmonary Disease

## 2014-06-16 ENCOUNTER — Ambulatory Visit (INDEPENDENT_AMBULATORY_CARE_PROVIDER_SITE_OTHER)
Admission: RE | Admit: 2014-06-16 | Discharge: 2014-06-16 | Disposition: A | Discharge status: Home / Self Care | Payer: BLUE CROSS/BLUE SHIELD | Source: Ambulatory Visit | Attending: Pulmonary Disease | Admitting: Pulmonary Disease

## 2014-06-16 ENCOUNTER — Encounter: Payer: Self-pay | Admitting: Pulmonary Disease

## 2014-06-16 DIAGNOSIS — J189 Pneumonia, unspecified organism: Secondary | ICD-10-CM | POA: Diagnosis not present

## 2014-06-16 DIAGNOSIS — R05 Cough: Secondary | ICD-10-CM

## 2014-06-16 DIAGNOSIS — J449 Chronic obstructive pulmonary disease, unspecified: Secondary | ICD-10-CM | POA: Diagnosis not present

## 2014-06-16 DIAGNOSIS — R059 Cough, unspecified: Secondary | ICD-10-CM

## 2014-06-16 DIAGNOSIS — J418 Mixed simple and mucopurulent chronic bronchitis: Secondary | ICD-10-CM

## 2014-06-16 MED ORDER — HYDROCOD POLST-CPM POLST ER 10-8 MG/5ML PO SUER: 5.0000 mL | Freq: Two times a day (BID) | ORAL | Status: DC | PRN

## 2014-06-16 MED ORDER — METHYLPREDNISOLONE ACETATE 80 MG/ML IJ SUSP: 120.0000 mg | Freq: Once | INTRAMUSCULAR | Status: DC

## 2014-06-16 MED ORDER — TRAMADOL HCL 50 MG PO TABS: 50.0000 mg | ORAL_TABLET | Freq: Three times a day (TID) | ORAL | Status: DC

## 2014-06-16 NOTE — Progress Notes (Signed)
Subjective:     Patient ID: Judy [name], female   DOB: Jan 02, 1961, 54 y.o.   MRN: [id number]  HPI 44 yowf quit smoking 2007  @  150 lb  with cough that resolved and no resp problems until winter 2015 with doe x walking the dog s much in terms of cough then 10/04/13 sudden sense she couldn't get a breath and coughed up blood x one tsp plus slt green mucus  > better with saba and started on inhalers/ abx  >  better and pred taper and dx of GOLD II copd was made 11/2013 and non ischemic cardiomyopathy 05/14/14.   History of Present Illness  10/08/2013 1st [facility] office visit/ Wert Chief Complaint  Patient presents with  . Pulmonary Consult    Referred by Penni Homans Cox-sob with exertion x 6-8 mths.,worse since 4 days ago,occass. cough-tsp. of blood 4 days ago,usually unprod.,no wheezing,midchest tightness,no fcs,Had neb. since yesterday(used 2x yesterday)   baseline = 177ft   to mailbox and sob when gets there.  rec Clonidine 0.1 mg twice daily until you return  Plan A = automatic = symbiocort 160 Take 2 puffs first thing in am and then another 2 puffs about 12 hours later.  Plan B = Only use your albuterol (proair)as a rescue medication    Plan C = nebulizer albuterol, ok to use up to every 4 hours if can't get relief from Plan B GERD diet   01/08/2014 acute ov/Wert re:  uneplained sob/ cough  Chief Complaint  Patient presents with  . Follow-up    SOB cough productive red greenish mucus   better p last ov but then noticed recurrent  sob walking dogs despite symbicort 160 w/in a week or so of ov then then much worse with bad cough green/bloody mucus x 5 days rx with change to anoro no better so far rec Please see patient coordinator before you leave today  to schedule CTa of chest and Sinus CT asap and echo as well  Please remember to go to the lab  department downstairs for your tests - we will call you with the results when they are available. Start back on symbicort 160 Take 2  puffs first thing in am and then another 2 puffs about 12 hours later.  Try prilosec   Take 30-60 min before first meal of the day and Pepcid 20 mg one bedtime until return Augmentin 875 mg take one pill twice daily  X 10 days  No aspirin with any bleeding - hold until no blood x 3 straight days Please schedule a follow up office visit in 2 weeks, sooner if needed with all active meds in hand Late add Prednisone 10 mg take  4 each am x 2 days,   2 each am x 2 days,  1 each am x 2 days and stop   02/21/2014 Acute OV/NP  Patient presents for an acute office visit for an acute office visit. She complains over the last 2 days that she has had increased cough, congestion with thick green mucus, and this morning started to see some blood mixed into her mucus. She denies any frank hemoptysis.. She denies any fever, chest pain, orthopnea, PND, or increased leg swelling Appetite is good with no nausea, vomiting or diarrhea Patient is planning to leave for a trip to [place] for a family wedding next week. Chest x-ray today shows a right lower lobe airspace disease. Patient has chronic back pain from scoliosis and previous  surgery She is on chronic narcotics with Opana and Fioricet as needed. She does use gabapentin 400 mg 2-3 times a day. She denies any dysphagia, heartburn. CT chest in 01/08/2014 showed persistent groundglass infiltrates along the lower lobes and right middle and upper lobe. CT sinus without any acute disease CBC in November showed normal eosinophil count. Previous sedimentation rate 35. rec Levaquin 750mg  daily for 7 days  Mucinex DM Twice daily  As needed  Cough/congestion  Fluids and rest  No eating 3 hr before bedtime.   02/26/2014 f/u ov/Wert re: f/ u ? pna inpt with copd GOLD II maint on symbicort 160 2bid  Chief Complaint  Patient presents with  . Follow-up    cxr done today. Pt states that her breathing is doing some better. Her cough is some better- still producing some  sputum- clear and frothy.   No more green or bloody sputum Still a little sob and slt prod cough  Esp in am but nothing purulent or bloody  No need for any rescue rx  rec If cough/ wheeze / short of breath worsen : Prednisone 10 mg take  4 each am x 2 days,   2 each am x 2 days,  1 each am x 2 days and stop  Call with formulary alternatives to symbicort 160 2 every 12 hours (dulera should be one option)  - 03/07/2014 recurrent hemoptysis rec  HRCT chest 03/13/2014 > Significant improvement in the appearance of the lung parenchyma compared to the prior examination ? Boop?   03/27/2014 f/u ov/Wert re: GOLD II copd with superimposed AS dz ? alv hem ? Boop/resolved  Chief Complaint  Patient presents with  . Follow-up    Pt states woke up this am with increased SOB and prod cough with green sputum.    doing rehab completely flat x stops p 5 min to let 02 recover Not consistent with resp meds / insurance formulary restrictions not clear  Not using saba prn in any form. > Continue on Leona and Tudorza   04/10/2014 Follow up : GOLD II COPD w/ ? BOOP resolved Patient returns for a two-week follow-up and medication review We reviewed all her medications organize them into a medication count with patient education Appears that she is taking her medications correctly Patient has briefly been in pulmonary rehabilitation with notable drops in her oxygen level with exercise Today in the office, patient with desaturation to 88% with walking room air. Previous 2-D echo in November 2015 showed EF at 45-50%, with elevated BNP. Around 400 We discussed a cardiology referral for evaluation of possible underlying congestive heart failure. Patient does complain that she snores and has daytime sleepiness, we discussed checking her oxygen level while she was sleeping If positive explained that she may need a sleep test in the future. She denies any chest pain, palpitations, syncope, abdominal pain, vomiting,  or increased leg swelling Patient had previous COPD exacerbation versus possible Boop with productive cough and hemoptysis. CT chest in 01/08/2014 showed persistent groundglass infiltrates along the lower lobes and right middle and upper lobe. Previous sedimentation rate 35. She was treated with steroids and antibiotics Symptoms improved and repeat CT in January show significant improvement in aeration and decreased ground glass attenuation. rec Follow med calendar closely and bring to each visit Labs today We are setting, you up for a overnight oximetry test We are referring you to cardiology for abnormal echo Begin oxygen 2 L with activity.  LHC 05/14/14: 1.  No significant coronary artery disease 2. Moderate to severely reduced LV systolic function due to nonischemic cardiomyopathy.  3. Moderately elevated left ventricular end-diastolic pressure.  05/22/2014 f/u ov/Wert re: confused with med names/ taking spiriva bid /not using med calendar provided Chief Complaint  Patient presents with  . Follow-up    Pt states that her breathing has been doing well. She has not needed her rescue inhaler or neb.   Only uses 02 with exertion/ back stops her before the breathing and when exerts uses 2lpm  On recumbent bike does 7 min drops to 90 then puts on 02   No obvious day to day or daytime variabilty or assoc chronic cough or cp or chest tightness, subjective wheeze overt sinus or hb symptoms. No unusual exp hx or h/o childhood pna/ asthma or knowledge of premature birth.  Sleeping ok without nocturnal or early am exacerbation of respiratory c/o's or need for noct saba. Also denies any obvious fluctuation of symptoms with weather or environmental changes or other aggravating or alleviating factors except as outlined above        06/16/14:  Add-on appt w/ SN re: persistent cough, s/p pneumonia... Chief Complaint  Patient presents with  . Acute Visit    MW Pt>  add-on appt for  cough...  54 y/o WF, pt of DrWert w/ GOLD Stage 2 COPD (FEV1=1.6L, 60%), ex-smoker quit 2007, w/ mult medical issues including HBP, nonischemic dilated cardiomyopathy w/ EF=45%, obesity, scoliosis w/ surg & Harrington rod... She was seen 4/7 and DrWert stopped her Spiriva & rec Symbicort prn;  She went on a cruise & started coughing/ URI/ drainage, had to see the ship's doctor w/ XRay done said to show pneumonia; treated w/ Levaquin & Medrol; returned 4/23 and saw her LMD, DrKCox, given Biaxin & Pred; she had to fly to [facility] area for a wedding & got worse; now c/o cough, green sput, SOB w/ the cough paroxysms, chest discomfort from the coughing as well, no f/c/s...  EXAM showed Afeb, VSS, O2sat=92% on RA at rest;  HEENT- sl red, no exud or swelling;  Chest- decr BS bilat at bases, few scat rhonchi, no wheezing or consolidation;  Heart- RR no m/r/g;  Ext- no c/c, R>L edema;  NOTE> she has ecchymoses on R>L flank areas, no known trauma, labs 3/16 w/ norm plats/ PT/ PTT, the bruising is in a seat belt distrb, occurred after airplane trip, advised observation...   CXR today revealed heart at upper lim of norm, lungs clear w/o pneum, fluid, etc...   (I reviewed the XRay images in Epic + her prev PFTs, CTChest,& Cardiology notes). PLAN>>  She has completed Levaquin and Biaxin courses, she is Afeb etc-no further antibiotics needed; CC is persistent cough- mildly productive & we discussed Mucinex 1200mg Bid, fluids, etc; Rec TUSSIONEX one tsp Bid, TRAMADOL50Tid prn, and if needed she has Oxycodone from her Ortho...    Past Medical History  Diagnosis Date  . COPD (chronic obstructive pulmonary disease)   . Hypertension >> on Coreg6.25Bid, Lisin10, Lasix20   . LV dysfunction >> nonischemic dilated cardiomyopathy   . Obesity   . Former tobacco use   . Scoliosis    Past Surgical History  Procedure Laterality Date  . Harrington rod scoliosis      1981  . Fasciotomy      1993  . Umbilical hernia repair     . Vesicovaginal fistula closure w/ tah    . Carpal tunnel release    .  Artery repair    . Rotator cuff repair    . Ankle artery involved cyst removal    . Abdominal hysterectomy    . Left heart catheterization with coronary angiogram N/A 05/14/2014    Procedure: LEFT HEART CATHETERIZATION WITH CORONARY ANGIOGRAM;  Surgeon: Iran Ouch, MD;  Location: [facility] CATH [facility];  Service: Cardiovascular;  Laterality: N/A;    Current Outpatient Prescriptions on File Prior to Visit  06/16/14  Medication Sig Dispense Refill  . albuterol (PROVENTIL) (2.5 MG/3ML) 0.083% nebulizer solution Take 2.5 mg by nebulization every 4 (four) hours as needed for wheezing or shortness of breath (((PLAN C))).    Marland Kitchen aspirin 81 MG tablet Take 81 mg by mouth at bedtime.     Marland Kitchen atorvastatin (LIPITOR) 20 MG tablet Take 1 tablet (20 mg total) by mouth daily at 6 PM. 90 tablet 3  . carvedilol (COREG) 6.25 MG tablet Take 1 tablet (6.25 mg total) by mouth 2 (two) times daily with a meal. 60 tablet 2  . Cholecalciferol (VITAMIN D-3) 1000 UNITS CAPS Take 1 capsule by mouth daily.    . clonazePAM (KLONOPIN) 1 MG tablet Take 0.5 mg by mouth at bedtime.     . famotidine (PEPCID) 20 MG tablet Take 20 mg by mouth at bedtime.    . furosemide (LASIX) 20 MG tablet Take 1 tablet (20 mg total) by mouth daily. 30 tablet 0  . Gabapentin, Once-Daily, 300 & 600 MG MISC Take 600 mg by mouth at bedtime. Pt will start taking 1800 mg by mouth at bedtime.    . Lactobacillus (ACIDOPHILUS) CAPS capsule Take 1 capsule by mouth daily.    Marland Kitchen lisinopril (PRINIVIL,ZESTRIL) 10 MG tablet Take 1 tablet (10 mg total) by mouth daily. 30 tablet 6  . Lutein-Zeaxanthin 25-5 MG CAPS Take 1 capsule by mouth at bedtime.     . Multiple Vitamin (MULTIVITAMIN WITH MINERALS) TABS tablet Take 1 tablet by mouth daily.    Marland Kitchen omeprazole (PRILOSEC) 20 MG capsule Take 20 mg by mouth every morning.     . OxyCODONE HCl 7.5 MG TABA Take 7.5 mg by mouth as needed.    . tiotropium  (SPIRIVA) 18 MCG inhalation capsule Place 18 mcg into inhaler and inhale daily.    Marland Kitchen topiramate (TOPAMAX) 50 MG tablet Take 50 mg by mouth at bedtime.    Marland Kitchen venlafaxine XR (EFFEXOR-XR) 150 MG 24 hr capsule Take 150 mg by mouth daily with breakfast.    . venlafaxine XR (EFFEXOR-XR) 75 MG 24 hr capsule Take 75 mg by mouth daily with breakfast.     Allergies  Allergen Reactions  . Demerol [Meperidine] Nausea And Vomiting    Current Medications, Allergies, Past Medical History, Past Surgical History, Family History, and Social History were reviewed in [employer] record.   Review of Systems    ROS  The following are not active complaints unless bolded >>  sore throat, dysphagia, dental problems, itching, sneezing,  nasal congestion or excess/ purulent secretions, ear ache,   fever, chills, sweats, unintended wt loss, pleuritic or exertional cp, hemoptysis,  orthopnea pnd or leg swelling, presyncope, palpitations, heartburn, abdominal pain, anorexia, nausea, vomiting, diarrhea  or change in bowel or urinary habits, change in stools or urine, dysuria,hematuria,  rash, arthralgias, visual complaints, headache, numbness weakness or ataxia or problems with walking or coordination,  change in mood/affect or memory.   Objective:   Physical Exam    54 y/o WF, overweight, alert, cooperative, NAD... HEENT:  nl dentition, turbinates, and orophanx. Nl external ear canals without cough reflex NECK :  without JVD/Nodes/TM/ nl carotid upstrokes bilaterally LUNGS: no acc muscle use, cough, few scat rhonchi w/o wheezing consolidation etc... CV:  RRR  no s3 or murmur or increase in P2, tr  edema  ABD:  soft and nontender with nl excursion in the supine position. No bruits or organomegaly, bowel sounds nl MS:  warm without deformities, calf tenderness, cyanosis or clubbing SKIN: warm and dry without lesions     Assessment:      GOLD Stage 2 COPD w/ acute exac Nonischemic dilated  cardiomyopathy w/ EF=45% HBP Obesity Hx scoliosis w/ Harrington rod  She has completed Levaquin and Biaxin courses, she is Afeb etc-no further antibiotics needed; CC is persistent cough- mildly productive & we discussed Mucinex 1200mg Bid, fluids, etc; Rec TUSSIONEX one tsp Bid, TRAMADOL50Tid prn, and if needed she has Oxycodone from her Ortho...     Plan:     Patient's Medications  New Prescriptions   CHLORPHENIRAMINE-HYDROCODONE (TUSSIONEX PENNKINETIC ER) 10-8 MG/5ML SUER    Take 5 mLs by mouth every 12 (twelve) hours as needed for cough.   TRAMADOL (ULTRAM) 50 MG TABLET    Take 1 tablet (50 mg total) by mouth 3 (three) times daily.  Previous Medications   ALBUTEROL (PROVENTIL) (2.5 MG/3ML) 0.083% NEBULIZER SOLUTION    Take 2.5 mg by nebulization every 4 (four) hours as needed for wheezing or shortness of breath (((PLAN C))).   ASPIRIN 81 MG TABLET    Take 81 mg by mouth at bedtime.    ATORVASTATIN (LIPITOR) 20 MG TABLET    Take 1 tablet (20 mg total) by mouth daily at 6 PM.   CARVEDILOL (COREG) 6.25 MG TABLET    Take 1 tablet (6.25 mg total) by mouth 2 (two) times daily with a meal.   CHOLECALCIFEROL (VITAMIN D-3) 1000 UNITS CAPS    Take 1 capsule by mouth daily.   CLONAZEPAM (KLONOPIN) 1 MG TABLET    Take 0.5 mg by mouth at bedtime.    FAMOTIDINE (PEPCID) 20 MG TABLET    Take 20 mg by mouth at bedtime.   FUROSEMIDE (LASIX) 20 MG TABLET    Take 1 tablet (20 mg total) by mouth daily.   GABAPENTIN, ONCE-DAILY, 300 & 600 MG MISC    Take 600 mg by mouth at bedtime. Pt will start taking 1800 mg by mouth at bedtime.   LACTOBACILLUS (ACIDOPHILUS) CAPS CAPSULE    Take 1 capsule by mouth daily.   LEVOFLOXACIN (LEVAQUIN) 500 MG TABLET    Take 500 mg by mouth daily. Pt takes 1 1/2 tablets by mouth daily   LISINOPRIL (PRINIVIL,ZESTRIL) 10 MG TABLET    Take 1 tablet (10 mg total) by mouth daily.   LUTEIN-ZEAXANTHIN 25-5 MG CAPS    Take 1 capsule by mouth at bedtime.    METHYLPREDNISOLONE (MEDROL) 4  MG TABLET    Take 4 mg by mouth daily.   MULTIPLE VITAMIN (MULTIVITAMIN WITH MINERALS) TABS TABLET    Take 1 tablet by mouth daily.   OMEPRAZOLE (PRILOSEC) 20 MG CAPSULE    Take 20 mg by mouth every morning.    OXYCODONE HCL 7.5 MG TABA    Take 7.5 mg by mouth as needed.   TIOTROPIUM (SPIRIVA) 18 MCG INHALATION CAPSULE    Place 18 mcg into inhaler and inhale daily.   TOPIRAMATE (TOPAMAX) 50 MG TABLET    Take 50 mg by mouth at bedtime.  VENLAFAXINE XR (EFFEXOR-XR) 150 MG 24 HR CAPSULE    Take 150 mg by mouth daily with breakfast.   VENLAFAXINE XR (EFFEXOR-XR) 75 MG 24 HR CAPSULE    Take 75 mg by mouth daily with breakfast.  Modified Medications   No medications on file  Discontinued Medications   No medications on file

## 2014-06-16 NOTE — Patient Instructions (Signed)
Today we updated your med list in our EPIC system...    Continue your current medications the same...  Today we did a follow up CXR to be sure the pneumonia diagnosed by your cruise ship doc has resolved...    We will contact you w/ the results when available...   For your persistent cough>      We gave you a Depo shot for the inflammation...    We wrote for TUSSIONEX - one tsp every 12h as needed for the cough (take at night to help you rest).    We also wrote for TRAMADOL 50mg - take up to 3 times daily as needed for pain & coughing...    You may continue w/ the MUCINEX 1200mg  twice daily + fluids etc...  Call for any questions.Marland KitchenMarland Kitchen

## 2014-07-03 ENCOUNTER — Other Ambulatory Visit: Payer: Self-pay

## 2014-07-03 MED ORDER — CARVEDILOL 6.25 MG PO TABS: 6.2500 mg | ORAL_TABLET | Freq: Two times a day (BID) | ORAL | Status: DC

## 2014-07-04 ENCOUNTER — Other Ambulatory Visit (INDEPENDENT_AMBULATORY_CARE_PROVIDER_SITE_OTHER): Payer: BLUE CROSS/BLUE SHIELD | Admitting: *Deleted

## 2014-07-04 ENCOUNTER — Other Ambulatory Visit: Payer: Self-pay | Admitting: *Deleted

## 2014-07-04 DIAGNOSIS — E785 Hyperlipidemia, unspecified: Secondary | ICD-10-CM

## 2014-07-04 LAB — LIPID PANEL
Cholesterol: 184 mg/dL (ref 0–200)
HDL: 59.9 mg/dL (ref >39.00)
LDL Cholesterol: 105 mg/dL — ABNORMAL HIGH (ref 0–99)
NonHDL: 124.1
Total CHOL/HDL Ratio: 3
Triglycerides: 97 mg/dL (ref 0.0–149.0)
VLDL: 19.4 mg/dL (ref 0.0–40.0)

## 2014-07-04 LAB — HEPATIC FUNCTION PANEL
ALT: 25 U/L (ref 0–35)
AST: 17 U/L (ref 0–37)
Albumin: 3.7 g/dL (ref 3.5–5.2)
Alkaline Phosphatase: 133 U/L — ABNORMAL HIGH (ref 39–117)
Bilirubin, Direct: 0.1 mg/dL (ref 0.0–0.3)
Total Bilirubin: 0.5 mg/dL (ref 0.2–1.2)
Total Protein: 6.5 g/dL (ref 6.0–8.3)

## 2014-07-09 ENCOUNTER — Other Ambulatory Visit: Payer: Self-pay | Admitting: *Deleted

## 2014-07-09 MED ORDER — LISINOPRIL 10 MG PO TABS: 10.0000 mg | ORAL_TABLET | Freq: Every day | ORAL | Status: DC

## 2014-07-09 MED ORDER — CARVEDILOL 6.25 MG PO TABS: 6.2500 mg | ORAL_TABLET | Freq: Two times a day (BID) | ORAL | Status: DC

## 2014-07-15 ENCOUNTER — Other Ambulatory Visit: Payer: Self-pay

## 2014-07-15 MED ORDER — ATORVASTATIN CALCIUM 20 MG PO TABS: 20.0000 mg | ORAL_TABLET | Freq: Every day | ORAL | Status: DC

## 2014-08-04 ENCOUNTER — Other Ambulatory Visit: Payer: Medicare Other

## 2014-08-21 ENCOUNTER — Ambulatory Visit (INDEPENDENT_AMBULATORY_CARE_PROVIDER_SITE_OTHER): Payer: BLUE CROSS/BLUE SHIELD | Admitting: Internal Medicine

## 2014-08-21 ENCOUNTER — Encounter: Payer: Self-pay | Admitting: Internal Medicine

## 2014-08-21 VITALS — BP 124/82 | HR 91 | Ht 65.0 in | Wt 245.0 lb

## 2014-08-21 DIAGNOSIS — I10 Essential (primary) hypertension: Secondary | ICD-10-CM | POA: Diagnosis not present

## 2014-08-21 DIAGNOSIS — R05 Cough: Secondary | ICD-10-CM

## 2014-08-21 DIAGNOSIS — R059 Cough, unspecified: Secondary | ICD-10-CM

## 2014-08-21 DIAGNOSIS — J449 Chronic obstructive pulmonary disease, unspecified: Secondary | ICD-10-CM | POA: Diagnosis not present

## 2014-08-21 MED ORDER — VALSARTAN 160 MG PO TABS
160.0000 mg | ORAL_TABLET | Freq: Every day | ORAL | Status: DC
Start: 2014-08-21 — End: 2014-08-21

## 2014-08-21 MED ORDER — VALSARTAN 160 MG PO TABS: 160.0000 mg | ORAL_TABLET | Freq: Every day | ORAL | Status: DC

## 2014-08-21 NOTE — Progress Notes (Signed)
Subjective:     Patient ID: Judy [name], female   DOB: 02-11-1961,  MRN: [id number]    Brief patient profile:  54 yowf quit smoking 2007  @  150 lb  with cough that resolved and no resp problems until winter 2015 with doe x walking the dog s much in terms of cough then 10/04/13 sudden sense she couldn't get a breath and coughed up blood x one tsp plus slt green mucus  > better with saba and started on inhalers/ abx  >  better and pred taper and dx of GOLD II copd was made 11/2013 and non ischemic cardiomyopathy 05/14/14.   History of Present Illness  10/08/2013 1st [facility] office visit/ Wert Chief Complaint  Patient presents with  . Pulmonary Consult    Referred by Penni Homans Cox-sob with exertion x 6-8 mths.,worse since 4 days ago,occass. cough-tsp. of blood 4 days ago,usually unprod.,no wheezing,midchest tightness,no fcs,Had neb. since yesterday(used 2x yesterday)   baseline = 187ft   to mailbox and sob when gets there.  rec Clonidine 0.1 mg twice daily until you return  Plan A = automatic = symbiocort 160 Take 2 puffs first thing in am and then another 2 puffs about 12 hours later.  Plan B = Only use your albuterol (proair)as a rescue medication    Plan C = nebulizer albuterol, ok to use up to every 4 hours if can't get relief from Plan B GERD diet     02/26/2014 f/u ov/Wert re: f/ u ? pna inpt with copd GOLD II maint on symbicort 160 2bid  Chief Complaint  Patient presents with  . Follow-up    cxr done today. Pt states that her breathing is doing some better. Her cough is some better- still producing some sputum- clear and frothy.   No more green or bloody sputum Still a little sob and slt prod cough  Esp in am but nothing purulent or bloody  No need for any rescue rx  rec If cough/ wheeze / short of breath worsen : Prednisone 10 mg take  4 each am x 2 days,   2 each am x 2 days,  1 each am x 2 days and stop  Call with formulary alternatives to symbicort 160 2 every 12  hours (dulera should be one option)   - 03/07/2014 recurrent hemoptysis rec  HRCT chest 03/13/2014 > Significant improvement in the appearance of the lung parenchyma compared to the prior examination ? Boop?     03/27/2014 f/u ov/Wert re: GOLD II copd with superimposed AS dz ? alv hem ? Boop/resolved  Chief Complaint  Patient presents with  . Follow-up    Pt states woke up this am with increased SOB and prod cough with green sputum.    doing rehab completely flat x stops p 5 min to let 02 recover Not consistent with resp meds / insurance formulary restrictions not clear  Not using saba prn in any form. > Continue on [place] and Tudorza    04/10/2014 Follow up : GOLD II COPD w/ ? BOOP resolved Patient returns for a two-week follow-up and medication review We reviewed all her medications organize them into a medication count with patient education Appears that she is taking her medications correctly Patient has briefly been in pulmonary rehabilitation with notable drops in her oxygen level with exercise Today in the office, patient with desaturation to 88% with walking room air. Previous 2-D echo in November 2015 showed EF at 45-50%,  with elevated BNP. Around 400 We discussed a cardiology referral for evaluation of possible underlying congestive heart failure. Patient does complain that she snores and has daytime sleepiness, we discussed checking her oxygen level while she was sleeping If positive explained that she may need a sleep test in the future. She denies any chest pain, palpitations, syncope, abdominal pain, vomiting, or increased leg swelling Patient had previous COPD exacerbation versus possible Boop with productive cough and hemoptysis. CT chest in 01/08/2014 showed persistent groundglass infiltrates along the lower lobes and right middle and upper lobe. Previous sedimentation rate 35. She was treated with steroids and antibiotics Symptoms improved and repeat CT in January  show significant improvement in aeration and decreased ground glass attenuation. rec Follow med calendar closely and bring to each visit Labs today We are setting, you up for a overnight oximetry test We are referring you to cardiology for abnormal echo Begin oxygen 2 L with activity.    LHC 05/14/14  1. No significant coronary artery disease 2. Moderate to severely reduced LV systolic function due to nonischemic cardiomyopathy.  3. Moderately elevated left ventricular end-diastolic pressure.    05/22/2014 f/u ov/Wert re: confused with med names/ taking spiriva bid /not using med calendar provided Chief Complaint  Patient presents with  . Follow-up    Pt states that her breathing has been doing well. She has not needed her rescue inhaler or neb.    Only uses 02 with exertion/ back stops her before the breathing and when exerts uses  2lpm  On recumbent bike does 7 min drops to 90 then puts on 02  rec spiriva is like high octane fuel for a car and you may not need it now > try off  If  breathing gets worse ok to use symbicort 160 up to 12hour as needed and restart spiriva as a maintenance medication See calendar for specific medication instructions and bring it back for each and every office visit for every healthcare provider you see.  Without it,  you may not receive the best quality medical care that we feel you deserve   08/21/2014 f/u ov/Wert re: GOLD II copd off all resp meds /  Mild chronic cough on acei  Chief Complaint  Patient presents with  . Follow-up    Pt states that her cough is much improved. She still feels tickle in her throat.    sense of  pnds is day > noct with urge to clear throat disprorportionate to mucus production which is minimal.   No obvious day to day or daytime variability or assoc chronic cough or cp or chest tightness, subjective wheeze or overt sinus or hb symptoms. No unusual exp hx or h/o childhood pna/ asthma or knowledge of premature  birth.  Sleeping ok without nocturnal  or early am exacerbation  of respiratory  c/o's or need for noct saba. Also denies any obvious fluctuation of symptoms with weather or environmental changes or other aggravating or alleviating factors except as outlined above   Current Medications, Allergies, Complete Past Medical History, Past Surgical History, Family History, and Social History were reviewed in [employer] record.  ROS  The following are not active complaints unless bolded sore throat, dysphagia, dental problems, itching, sneezing,  nasal congestion or excess/ purulent secretions, ear ache,   fever, chills, sweats, unintended wt loss, classically pleuritic or exertional cp, hemoptysis,  orthopnea pnd or leg swelling, presyncope, palpitations, abdominal pain, anorexia, nausea, vomiting, diarrhea  or change in  bowel or bladder habits, change in stools or urine, dysuria,hematuria,  rash, arthralgias, visual complaints, headache, numbness, weakness or ataxia or problems with walking or coordination,  change in mood/affect or memory.               Objective:   Physical Exam  amb obese  wf nad   11/27/2013     237 >   01/08/2014  236 > 01/21/2014 241 >235 02/21/2014 > 02/26/2014  232 > 03/27/2014  236 >236 04/10/2014 > 05/22/2014   237 >  08/21/2014 245   Vital signs review    HEENT: nl dentition, turbinates, and orophanx. Nl external ear canals without cough reflex   NECK :  without JVD/Nodes/TM/ nl carotid upstrokes bilaterally   LUNGS: no acc muscle use, clear to A and P bilaterally without cough on insp or exp maneuvers   CV:  RRR  no s3 or murmur or increase in P2, tr  edema bilaterally sym  ABD:  soft and nontender with nl excursion in the supine position. No bruits or organomegaly, bowel sounds nl  MS:  warm without deformities, calf tenderness, cyanosis or clubbing  SKIN: warm and dry without lesions      I personally reviewed images and agree with  radiology impression as follows:  CXR:  06/16/14  COPD. There is no CHF, pneumonia, nor other acute cardiopulmonary abnormality.    Assessment:

## 2014-08-21 NOTE — Patient Instructions (Addendum)
Stop prinivil and start diovan(valsartan) 160 mg one daily and the tickle/cough should gradually resolve   Please schedule a follow up office visit in 6 weeks, call sooner if needed pfts on return

## 2014-08-23 ENCOUNTER — Encounter: Payer: Self-pay | Admitting: Internal Medicine

## 2014-08-23 NOTE — Assessment & Plan Note (Signed)
Body mass index is 40.77 kg/(m^2).  Lab Results  Component Value Date   TSH 1.68 10/08/2013     Contributing to gerd tendency/ doe/ needs to achieve and maintain neg calorie balance >f/u primary care

## 2014-08-23 NOTE — Assessment & Plan Note (Signed)
ACE inhibitors are problematic in  pts with airway complaints because  even experienced pulmonologists can't always distinguish ace effects from copd/asthma/pnds/ allergies etc.  By themselves they don't actually cause a problem, much like oxygen can't by itself start a fire, but they certainly serve as a powerful catalyst or enhancer for any "fire"  or inflammatory process in the upper airway, be it caused by an ET  tube or more commonly reflux (especially in the obese or pts with known GERD or who are on biphoshonates) or URI's, due to interference with bradykinin clearance.  The effects of acei on bradykinin levels occurs in 100% of pt's on acei (unless they surreptitiously stop the med!) but the classic cough is only reported in 5%.  This leaves 95% of pts on acei's  with a variety of syndromes including no identifiable symptom in most  vs non-specific symptoms that wax and wane depending on what other insult is occuring at the level of the upper airway, esp gerd in the setting of morbid obesity  Will stop acei and start diovan 160 mg one daily > it's the only way to know cause and effect in this setting

## 2014-08-23 NOTE — Assessment & Plan Note (Signed)
11/27/2013  PFTs   FEV1  1.51 (54%) ratio 52 and and no better p B2 and DLCO  71 - 01/08/2014  p extensive coaching HFA effectiveness =    90% > rec resume symbicort 160 2bid  - 03/27/14 trial of dulera 200/tudorza samples only to assure adherence  04/10/2014   Alpha 1 MM , nml level  - trial off spiriva 05/22/14 / off all resp rx 08/22/14  Clearly if she had much of an asthmatic component at all it's cardiac asthma and may not need continued pulmonary rx at all but needs to return for f/u pfts for baseline but we change her over completely to prn f/u as she is quite complicated and difficult to sort out.   Cough is much more likely acei than related to copd > see hbp  I had an extended discussion with the patient reviewing all relevant studies completed to date and  lasting 15 to 20 minutes of a 25 minute visit    Each maintenance medication was reviewed in detail including most importantly the difference between maintenance and prns and under what circumstances the prns are to be triggered using an action plan format that is not reflected in the computer generated alphabetically organized AVS.    Please see instructions for details which were reviewed in writing and the patient given a copy highlighting the part that I personally wrote and discussed at today's ov.

## 2014-08-23 NOTE — Assessment & Plan Note (Signed)
-   sinus Ct 01/08/2014 > No evidence of chronic sinusitis - sinus CT 03/13/2014 > neg  - trial off acei 08/23/2014 >>>   Most likely her tickle represents  Classic Upper airway cough syndrome, so named because it's frequently impossible to sort out how much is  CR/sinusitis with freq throat clearing (which can be related to primary GERD)   vs  causing  secondary (" extra esophageal")  GERD from wide swings in gastric pressure that occur with throat clearing, often  promoting self use of mint and menthol lozenges that reduce the lower esophageal sphincter tone and exacerbate the problem further in a cyclical fashion.   These are the same pts (now being labeled as having "irritable larynx syndrome" by some cough centers) who not infrequently have a history of having failed to tolerate ace inhibitors,  dry powder inhalers or biphosphonates or report having atypical reflux symptoms that don't respond to standard doses of PPI , and are easily confused as having aecopd or asthma flares by even experienced allergists/ pulmonologists.   Try off acei and regroup in 6 weeks

## 2014-09-03 ENCOUNTER — Ambulatory Visit (HOSPITAL_COMMUNITY): Payer: BLUE CROSS/BLUE SHIELD | Attending: Cardiology

## 2014-09-03 ENCOUNTER — Other Ambulatory Visit: Payer: Self-pay

## 2014-09-03 ENCOUNTER — Other Ambulatory Visit (INDEPENDENT_AMBULATORY_CARE_PROVIDER_SITE_OTHER): Payer: BLUE CROSS/BLUE SHIELD | Admitting: *Deleted

## 2014-09-03 ENCOUNTER — Other Ambulatory Visit: Payer: Self-pay | Admitting: Cardiology

## 2014-09-03 DIAGNOSIS — R9430 Abnormal result of cardiovascular function study, unspecified: Secondary | ICD-10-CM

## 2014-09-03 DIAGNOSIS — I420 Dilated cardiomyopathy: Secondary | ICD-10-CM | POA: Diagnosis not present

## 2014-09-03 DIAGNOSIS — Z87891 Personal history of nicotine dependence: Secondary | ICD-10-CM | POA: Insufficient documentation

## 2014-09-03 DIAGNOSIS — E785 Hyperlipidemia, unspecified: Secondary | ICD-10-CM | POA: Diagnosis not present

## 2014-09-03 DIAGNOSIS — I10 Essential (primary) hypertension: Secondary | ICD-10-CM | POA: Diagnosis not present

## 2014-09-03 DIAGNOSIS — I517 Cardiomegaly: Secondary | ICD-10-CM | POA: Insufficient documentation

## 2014-09-03 LAB — HEPATIC FUNCTION PANEL
ALT: 20 U/L (ref 0–35)
AST: 16 U/L (ref 0–37)
Albumin: 3.7 g/dL (ref 3.5–5.2)
Alkaline Phosphatase: 146 U/L — ABNORMAL HIGH (ref 39–117)
Bilirubin, Direct: 0.1 mg/dL (ref 0.0–0.3)
Total Bilirubin: 0.5 mg/dL (ref 0.2–1.2)
Total Protein: 6.4 g/dL (ref 6.0–8.3)

## 2014-09-03 LAB — LIPID PANEL
Cholesterol: 194 mg/dL (ref 0–200)
HDL: 54.4 mg/dL (ref >39.00)
LDL Cholesterol: 111 mg/dL — ABNORMAL HIGH (ref 0–99)
NonHDL: 139.6
Total CHOL/HDL Ratio: 4
Triglycerides: 145 mg/dL (ref 0.0–149.0)
VLDL: 29 mg/dL (ref 0.0–40.0)

## 2014-09-03 MED ORDER — PERFLUTREN LIPID MICROSPHERE
1.0000 mL | Freq: Once | INTRAVENOUS | Status: AC
  Administered 2014-09-03: 1 mL via INTRAVENOUS

## 2014-09-03 NOTE — Addendum Note (Signed)
Addended by: Tonita Phoenix on: 09/03/2014 08:55 AM   Modules accepted: Orders

## 2014-09-04 ENCOUNTER — Telehealth: Payer: Self-pay

## 2014-09-04 DIAGNOSIS — I420 Dilated cardiomyopathy: Secondary | ICD-10-CM

## 2014-09-04 NOTE — Telephone Encounter (Signed)
Informed patient of results and verbal understanding expressed.   BMET and cardiac MRI ordered for scheduling. Patient agrees with treatment plan.

## 2014-09-04 NOTE — Telephone Encounter (Signed)
-----   Message from Quintella Reichert, MD sent at 09/03/2014  1:00 PM EDT ----- Moderate LV dysfunction but poor acoustical windows. This was supposed to e a cardiac MRI to evaluate LV further - please set this up

## 2014-09-05 NOTE — Telephone Encounter (Signed)
yes

## 2014-09-08 ENCOUNTER — Encounter: Payer: Self-pay | Admitting: Cardiology

## 2014-09-08 NOTE — Telephone Encounter (Signed)
This encounter was created in error - please disregard.

## 2014-09-08 NOTE — Progress Notes (Signed)
This encounter was created in error - please disregard.

## 2014-09-08 NOTE — Telephone Encounter (Signed)
New message     Pt is wanting to know if test have been scheduled and if she needs to come tomorrow for appt. Please call to discuss

## 2014-09-08 NOTE — Telephone Encounter (Signed)
Per Dr. Mayford Knife, rescheduled patient to 8/23 - after MRI. Instructed patient to call if she experiences symptoms in the meantime. Patient agrees with treatment plan.

## 2014-09-09 ENCOUNTER — Encounter: Payer: Self-pay | Admitting: Cardiology

## 2014-09-09 ENCOUNTER — Encounter: Payer: BLUE CROSS/BLUE SHIELD | Admitting: Cardiology

## 2014-09-19 ENCOUNTER — Ambulatory Visit (HOSPITAL_COMMUNITY)
Admission: RE | Admit: 2014-09-19 | Discharge: 2014-09-19 | Disposition: A | Discharge status: Home / Self Care | Payer: BLUE CROSS/BLUE SHIELD | Source: Ambulatory Visit | Attending: Cardiology | Admitting: Cardiology

## 2014-09-19 DIAGNOSIS — I420 Dilated cardiomyopathy: Secondary | ICD-10-CM | POA: Diagnosis not present

## 2014-09-19 DIAGNOSIS — I509 Heart failure, unspecified: Secondary | ICD-10-CM | POA: Insufficient documentation

## 2014-09-19 LAB — CREATININE, SERUM
Creatinine, Ser: 0.68 mg/dL (ref 0.44–1.00)
GFR calc Af Amer: >60 mL/min (ref >60)
GFR calc non Af Amer: >60 mL/min (ref >60)

## 2014-09-19 MED ORDER — GADOBENATE DIMEGLUMINE 529 MG/ML IV SOLN
35.0000 mL | Freq: Once | INTRAVENOUS | Status: AC | PRN
  Administered 2014-09-19: 35 mL via INTRAVENOUS

## 2014-09-23 ENCOUNTER — Telehealth: Payer: Self-pay

## 2014-09-23 DIAGNOSIS — I420 Dilated cardiomyopathy: Secondary | ICD-10-CM

## 2014-09-23 MED ORDER — CARVEDILOL 12.5 MG PO TABS: 12.5000 mg | ORAL_TABLET | Freq: Two times a day (BID) | ORAL | Status: DC

## 2014-09-23 MED ORDER — SPIRONOLACTONE 25 MG PO TABS: 25.0000 mg | ORAL_TABLET | Freq: Every day | ORAL | Status: DC

## 2014-09-23 NOTE — Telephone Encounter (Signed)
-----   Message from Quintella Reichert, MD sent at 09/20/2014 10:39 PM EDT ----- Please let patient know that cardiac MRI showed persistence of severe LV dysfunction with EF 28% despite medical therapy for DCM.  Please have her increase coreg to 12.5mg  BID and add aldactone 25mg  daily.  Check BMET in 1 week.  Refer to EP for consideration of prophylactic ICD.

## 2014-09-23 NOTE — Telephone Encounter (Signed)
Informed patient of results and verbal understanding expressed.  Instructed patient to INCREASE COREG to 12.5 BID. Instructed patient to START ALDACTONE 25 mg daily. BMET scheduled for 8/17. Informed patient that the EP scheduler will call her with an appointment. Patient agrees with treatment plan.

## 2014-09-24 ENCOUNTER — Encounter: Payer: Self-pay | Admitting: Cardiology

## 2014-09-24 ENCOUNTER — Encounter: Payer: Self-pay | Admitting: Internal Medicine

## 2014-09-24 DIAGNOSIS — I10 Essential (primary) hypertension: Secondary | ICD-10-CM

## 2014-09-24 NOTE — Telephone Encounter (Signed)
MW please advise.  Thanks.  

## 2014-10-01 ENCOUNTER — Other Ambulatory Visit (INDEPENDENT_AMBULATORY_CARE_PROVIDER_SITE_OTHER): Payer: BLUE CROSS/BLUE SHIELD | Admitting: *Deleted

## 2014-10-01 DIAGNOSIS — I420 Dilated cardiomyopathy: Secondary | ICD-10-CM | POA: Diagnosis not present

## 2014-10-01 LAB — BASIC METABOLIC PANEL
BUN: 16 mg/dL (ref 6–23)
CALCIUM: 9.4 mg/dL (ref 8.4–10.5)
CO2: 29 meq/L (ref 19–32)
Chloride: 104 mEq/L (ref 96–112)
Creatinine, Ser: 0.65 mg/dL (ref 0.40–1.20)
GFR: 100.87 mL/min (ref >60.00)
GLUCOSE: 102 mg/dL — AB (ref 70–99)
Potassium: 3.8 mEq/L (ref 3.5–5.1)
Sodium: 141 mEq/L (ref 135–145)

## 2014-10-07 ENCOUNTER — Encounter: Payer: Self-pay | Admitting: Cardiology

## 2014-10-07 ENCOUNTER — Ambulatory Visit: Payer: BLUE CROSS/BLUE SHIELD | Admitting: Internal Medicine

## 2014-10-07 ENCOUNTER — Ambulatory Visit (INDEPENDENT_AMBULATORY_CARE_PROVIDER_SITE_OTHER): Payer: BLUE CROSS/BLUE SHIELD | Admitting: Cardiology

## 2014-10-07 VITALS — BP 120/84 | HR 95 | Ht 65.0 in | Wt 258.8 lb

## 2014-10-07 DIAGNOSIS — I5042 Chronic combined systolic (congestive) and diastolic (congestive) heart failure: Secondary | ICD-10-CM

## 2014-10-07 DIAGNOSIS — I10 Essential (primary) hypertension: Secondary | ICD-10-CM

## 2014-10-07 DIAGNOSIS — I5022 Chronic systolic (congestive) heart failure: Secondary | ICD-10-CM | POA: Insufficient documentation

## 2014-10-07 DIAGNOSIS — R0600 Dyspnea, unspecified: Secondary | ICD-10-CM

## 2014-10-07 DIAGNOSIS — I420 Dilated cardiomyopathy: Secondary | ICD-10-CM

## 2014-10-07 HISTORY — DX: Chronic combined systolic (congestive) and diastolic (congestive) heart failure: I50.42

## 2014-10-07 MED ORDER — CARVEDILOL 25 MG PO TABS: 25.0000 mg | ORAL_TABLET | Freq: Two times a day (BID) | ORAL | Status: DC

## 2014-10-07 MED ORDER — FUROSEMIDE 40 MG PO TABS: 40.0000 mg | ORAL_TABLET | Freq: Every day | ORAL | Status: DC

## 2014-10-07 NOTE — Progress Notes (Addendum)
Cardiology Office Note   Date:  10/07/2014   ID:  Judy [name], DOB 30-Oct-1960, MRN [id number]  PCP:  Blane Ohara, MD    Chief Complaint  Patient presents with  . DCM      History of Present Illness: Judy [name] is a 54 y.o. female who presents for followup of reduced LVF with EF 45%. She is followed by Dr. Sherene Sires for COPD. She also has had some palpitations in the past but denies any chest pressure or pain. She is on home O2 at night. She has chronic LE edema.She had a nuclear stress test in March which was abnormal and underwent cardiac cath showing no significant coronary disease, moderate to severely reduced LVF c/w nonischemic DCM and elevetd LVEDP. She was changed from Verapamil to Carvedilol.She underwent Cardiac MRI after 3 months of medical therapy which showed persistence of DCM and EF 28% and she has been referred to EP for prophylactic AICD. She presents back for followup. She denies any chest pain but does still have some SOB with walking. She denies any LE edema, palpitations, dizziness or syncope.  She says that she snores and feels tired.  She is set up for a sleep study in October.      Past Medical History  Diagnosis Date  . COPD (chronic obstructive pulmonary disease)   . Hypertension   . Obesity   . Former tobacco use   . Scoliosis   . Chronic systolic CHF (congestive heart failure), NYHA class 2 10/07/2014  . DCM (dilated cardiomyopathy) 04/25/2014    EF 28% by MRI despite maximum medical therapy    Past Surgical History  Procedure Laterality Date  . Harrington rod scoliosis      1981  . Fasciotomy      1993  . Umbilical hernia repair    . Vesicovaginal fistula closure w/ tah    . Carpal tunnel release    . Artery repair    . Rotator cuff repair    . Ankle artery involved cyst removal    . Abdominal hysterectomy    . Left heart catheterization with coronary angiogram N/A 05/14/2014    Procedure: LEFT HEART  CATHETERIZATION WITH CORONARY ANGIOGRAM;  Surgeon: Iran Ouch, MD;  Location: [facility]ATH [facility];  Service: Cardiovascular;  Laterality: N/A;  . Cardiac catheterization  05/14/2014    normal coronary arteries     Current Outpatient Prescriptions  Medication Sig Dispense Refill  . albuterol (PROVENTIL) (2.5 MG/3ML) 0.083% nebulizer solution Take 2.5 mg by nebulization every 4 (four) hours as needed for wheezing or shortness of breath (((PLAN C))).    Marland Kitchen aspirin 81 MG tablet Take 81 mg by mouth at bedtime.     Marland Kitchen atorvastatin (LIPITOR) 20 MG tablet Take 1 tablet (20 mg total) by mouth daily at 6 PM. 90 tablet 3  . calcium citrate-vitamin D (CITRACAL+D) 315-200 MG-UNIT per tablet Take 1 tablet by mouth daily.    . Cholecalciferol (VITAMIN D-3) 1000 UNITS CAPS Take 1 capsule by mouth daily.    . famotidine (PEPCID) 20 MG tablet Take 20 mg by mouth at bedtime.    . furosemide (LASIX) 40 MG tablet Take 1 tablet (40 mg total) by mouth daily. 90 tablet 3  . Gabapentin, Once-Daily, (GRALISE) 600 MG TABS Take 3 tablets by mouth at bedtime.    . Lactobacillus (ACIDOPHILUS) CAPS capsule Take 1 capsule by mouth  daily.    . Lutein-Zeaxanthin 25-5 MG CAPS Take 1 capsule by mouth at bedtime.     Marland Kitchen MAGNESIUM PO Take 1 tablet by mouth daily. Pt takes Magnesium malate    . Multiple Vitamin (MULTIVITAMIN WITH MINERALS) TABS tablet Take 1 tablet by mouth daily.    Marland Kitchen omeprazole (PRILOSEC) 20 MG capsule Take 20 mg by mouth every morning.     . OxyCODONE HCl 7.5 MG TABA Take 7.5 mg by mouth as needed.    . potassium chloride SA (K-DUR,KLOR-CON) 20 MEQ tablet Take 20 mEq by mouth daily.    Marland Kitchen spironolactone (ALDACTONE) 25 MG tablet Take 1 tablet (25 mg total) by mouth daily. 30 tablet 6  . topiramate (TOPAMAX) 50 MG tablet Take 50 mg by mouth at bedtime.    . valsartan (DIOVAN) 160 MG tablet Take 1 tablet (160 mg total) by mouth daily. 90 tablet 3  . venlafaxine XR (EFFEXOR-XR) 150 MG 24 hr capsule Take 150 mg by mouth  daily with breakfast.    . carvedilol (COREG) 25 MG tablet Take 1 tablet (25 mg total) by mouth 2 (two) times daily. 180 tablet 3   No current facility-administered medications for this visit.    Allergies:   Demerol    Social History:  The patient  reports that she quit smoking about 9 years ago. Her smoking use included Cigarettes. She has a 15 pack-year smoking history. She has never used smokeless tobacco. She reports that she drinks alcohol. She reports that she does not use illicit drugs.   Family History:  The patient's family history includes Allergies in her father; Emphysema in her father; Heart disease (age of onset: 22) in her father; Heart failure in her father.    ROS:  Please see the history of present illness.   Otherwise, review of systems are positive for none.   All other systems are reviewed and negative.    PHYSICAL EXAM: VS:  BP 120/84 mmHg  Pulse 95  Ht 5\' 5"  (1.651 m)  Wt 258 lb 12.8 oz (117.391 kg)  BMI 43.07 kg/m2  SpO2 94% , BMI Body mass index is 43.07 kg/(m^2). GEN: Well nourished, well developed, in no acute distress HEENT: normal Neck: no JVD, carotid bruits, or masses Cardiac: RRR; no murmurs, rubs, or gallops.  1+ LE edema  Respiratory:  clear to auscultation bilaterally, normal work of breathing GI: soft, nontender, nondistended, + BS MS: no deformity or atrophy Skin: warm and dry, no rash Neuro:  Strength and sensation are intact Psych: euthymic mood, full affect   EKG:  EKG is not ordered today.    Recent Labs: 10/08/2013: TSH 1.68 04/10/2014: Pro B Natriuretic peptide (BNP) 498.0* 05/13/2014: Hemoglobin 14.1; Platelets 317.0 09/03/2014: ALT 20 10/01/2014: BUN 16; Creatinine, Ser 0.65; Potassium 3.8; Sodium 141    Lipid Panel    Component Value Date/Time   CHOL 194 09/03/2014 0856   TRIG 145.0 09/03/2014 0856   HDL 54.40 09/03/2014 0856   CHOLHDL 4 09/03/2014 0856   VLDL 29.0 09/03/2014 0856   LDLCALC 111* 09/03/2014 0856       Wt Readings from Last 3 Encounters:  10/07/14 258 lb 12.8 oz (117.391 kg)  08/21/14 245 lb (111.131 kg)  06/10/14 239 lb 12.8 oz (108.773 kg)    ASSESSMENT AND PLAN:  1. Nonischemic DCM EF 27% on nuclear stress test, 45% on echo and 30% on cath with no CAD on recent cath. She will continue BB/aldactone/Lasix and ARB.  Cardiac  MRI showed persistent LV dysfunction and she has been referred to EP for prophylactic AICD.  She was supposed to have a ferritin level and protein electrophoresis but I cannot find that this was ever done so will reorder. 2. Chronic systolic CHF- continue Lasix. She appears slightly volume overloaded and weight is up.  I will increase her Lasix to 40mg  daily since she is still having SOB.  Her HR is in the high 90's and increases with ambulation.  I have instructed her to increase Coreg to 25mg  BID.  I have instructed her to weigh herself daily first thing in the am and record daily.  She is to call with weight gain >3lbs in 1 day for 5lbs in a week.  I have told her she can take an extra lasix if she starts to notice increased swelling, especially on her trip to [place]. 3. COPD followed by pulmonary 4. HTN - controlled on BB 5. Dyslipidemia with last LDL 111 - continue statin     Current medicines are reviewed at length with the patient today.  The patient does not have concerns regarding medicines.  The following changes have been made:  Increase Coreg to 25mg  BID.  Increase Lasix to 40mg  daily.  Labs/ tests ordered today: See above Assessment and Plan  Orders Placed This Encounter  Procedures  . Basic Metabolic Panel (BMET)  . Ferritin  . Protein Electrophoresis, (serum)     Disposition:   FU with me in 6 months  Signed, Quintella Reichert, MD  10/07/2014 6:48 PM    [facility] [address] [place], [place], [place]  [address] Phone: [phone]; Fax: [phone]

## 2014-10-07 NOTE — Patient Instructions (Signed)
Medication Instructions:  Your physician has recommended you make the following change in your medication:  1) START COREG 25 mg TWICE DAILY 2) INCREASE LASIX to 40 mg daily   Labwork: Your physician recommends that you return for lab work on Friday, 8/26 (BMET).  Testing/Procedures: None  Follow-Up: Your physician recommends that you schedule a follow-up appointment in: 3 months with Dr. Mayford Knife.  Any Other Special Instructions Will Be Listed Below (If Applicable). Please call our office if you experience a weight gain of 3 pounds in 1 day or 5 pounds in 1 week.

## 2014-10-08 ENCOUNTER — Other Ambulatory Visit: Payer: Self-pay | Admitting: Internal Medicine

## 2014-10-08 DIAGNOSIS — J449 Chronic obstructive pulmonary disease, unspecified: Secondary | ICD-10-CM

## 2014-10-09 ENCOUNTER — Ambulatory Visit (HOSPITAL_COMMUNITY)
Admission: RE | Admit: 2014-10-09 | Discharge: 2014-10-09 | Disposition: A | Discharge status: Home / Self Care | Payer: BLUE CROSS/BLUE SHIELD | Source: Ambulatory Visit | Attending: Cardiology | Admitting: Cardiology

## 2014-10-09 ENCOUNTER — Ambulatory Visit (INDEPENDENT_AMBULATORY_CARE_PROVIDER_SITE_OTHER): Payer: BLUE CROSS/BLUE SHIELD | Admitting: Internal Medicine

## 2014-10-09 ENCOUNTER — Encounter: Payer: Self-pay | Admitting: Internal Medicine

## 2014-10-09 ENCOUNTER — Other Ambulatory Visit: Payer: Self-pay | Admitting: Internal Medicine

## 2014-10-09 VITALS — BP 124/90 | HR 54 | Ht 64.5 in | Wt 257.0 lb

## 2014-10-09 DIAGNOSIS — J449 Chronic obstructive pulmonary disease, unspecified: Secondary | ICD-10-CM | POA: Diagnosis not present

## 2014-10-09 DIAGNOSIS — I10 Essential (primary) hypertension: Secondary | ICD-10-CM | POA: Diagnosis not present

## 2014-10-09 DIAGNOSIS — I5022 Chronic systolic (congestive) heart failure: Secondary | ICD-10-CM | POA: Diagnosis not present

## 2014-10-09 DIAGNOSIS — M7989 Other specified soft tissue disorders: Secondary | ICD-10-CM | POA: Insufficient documentation

## 2014-10-09 DIAGNOSIS — Z87891 Personal history of nicotine dependence: Secondary | ICD-10-CM | POA: Diagnosis not present

## 2014-10-09 LAB — PULMONARY FUNCTION TEST
DL/VA % pred: 73 %
DL/VA: 3.59 ml/min/mmHg/L
DLCO UNC % PRED: 69 %
DLCO unc: 17.24 ml/min/mmHg
FEF 25-75 POST: 0.91 L/s
FEF 25-75 Pre: 0.7 L/sec
FEF2575-%Change-Post: 29 %
FEF2575-%PRED-POST: 34 %
FEF2575-%Pred-Pre: 26 %
FEV1-%CHANGE-POST: 11 %
FEV1-%PRED-POST: 59 %
FEV1-%Pred-Pre: 53 %
FEV1-PRE: 1.48 L
FEV1-Post: 1.64 L
FEV1FVC-%Change-Post: 3 %
FEV1FVC-%PRED-PRE: 65 %
FEV6-%Change-Post: 6 %
FEV6-%Pred-Post: 86 %
FEV6-%Pred-Pre: 81 %
FEV6-POST: 2.96 L
FEV6-Pre: 2.79 L
FEV6FVC-%Change-Post: 0 %
FEV6FVC-%Pred-Post: 100 %
FEV6FVC-%Pred-Pre: 101 %
FVC-%Change-Post: 7 %
FVC-%PRED-PRE: 81 %
FVC-%Pred-Post: 86 %
FVC-POST: 3.05 L
FVC-PRE: 2.85 L
POST FEV1/FVC RATIO: 54 %
PRE FEV1/FVC RATIO: 52 %
PRE FEV6/FVC RATIO: 98 %
Post FEV6/FVC ratio: 97 %
RV % pred: 118 %
RV: 2.26 L
TLC % pred: 100 %
TLC: 5.17 L

## 2014-10-09 MED ORDER — AZITHROMYCIN 250 MG PO TABS: ORAL_TABLET | ORAL | Status: DC

## 2014-10-09 MED ORDER — VALSARTAN 160 MG PO TABS: 160.0000 mg | ORAL_TABLET | Freq: Every day | ORAL | Status: DC

## 2014-10-09 NOTE — Assessment & Plan Note (Addendum)
-   11/27/2013  PFTs   FEV1  1.51 (54%) ratio 52 and and no better p B2 and DLCO  71 - 01/08/2014  p extensive coaching HFA effectiveness =    90% > rec resume symbicort 160 2bid  - 03/27/14 trial of dulera 200/tudorza samples only to assure adherence  04/10/2014   Alpha 1 MM , nml level  - trial off spiriva 05/22/14 / off all resp rx 08/22/14 - PFT's  10/09/2014  FEV1 1.64 (59 % ) ratio 54  p 11 % improvement from saba with DLCO  69 % corrects to 73  % for alv volume    Moderate airflow obstruction with minimum reversibility albeit on moderate doses of coreg (see chf a/p) no better on vs off bronchodilators  For now will just add a zpak and emphasize dietary restrictions to help with any reflux related to obesity  I had an extended discussion with the patient reviewing all relevant studies completed to date and  lasting 15 to 20 minutes of a 25 minute visit    Each maintenance medication was reviewed in detail including most importantly the difference between maintenance and prns and under what circumstances the prns are to be triggered using an action plan format that is not reflected in the computer generated alphabetically organized AVS.    Please see instructions for details which were reviewed in writing and the patient given a copy highlighting the part that I personally wrote and discussed at today's ov.

## 2014-10-09 NOTE — Progress Notes (Signed)
Subjective:     Patient ID: Judy [name], female   DOB: May 01, 1960,  MRN: [id number]    Brief patient profile:  54 yowf quit smoking 2007  @  150 lb  with cough that resolved and no resp problems until winter 2015 with doe x walking the dog s much in terms of cough then 10/04/13 sudden sense she couldn't get a breath and coughed up blood x one tsp plus slt green mucus  > better with saba and started on inhalers/ abx  >  better and pred taper and dx of GOLD II copd was made 11/2013 and non ischemic cardiomyopathy 05/14/14.   History of Present Illness  10/08/2013 1st [facility] office visit/ [facility] Novacek Chief Complaint  Patient presents with  . Pulmonary Consult    Referred by Penni Homans Cox-sob with exertion x 6-8 mths.,worse since 4 days ago,occass. cough-tsp. of blood 4 days ago,usually unprod.,no wheezing,midchest tightness,no fcs,Had neb. since yesterday(used 2x yesterday)   baseline = 157ft   to mailbox and sob when gets there.  rec Clonidine 0.1 mg twice daily until you return  Plan A = automatic = symbiocort 160 Take 2 puffs first thing in am and then another 2 puffs about 12 hours later.  Plan B = Only use your albuterol (proair)as a rescue medication    Plan C = nebulizer albuterol, ok to use up to every 4 hours if can't get relief from Plan B GERD diet     02/26/2014 f/u ov/Osamah Schmader re: f/ u ? pna inpt with copd GOLD II maint on symbicort 160 2bid  Chief Complaint  Patient presents with  . Follow-up    cxr done today. Pt states that her breathing is doing some better. Her cough is some better- still producing some sputum- clear and frothy.   No more green or bloody sputum Still a little sob and slt prod cough  Esp in am but nothing purulent or bloody  No need for any rescue rx  rec If cough/ wheeze / short of breath worsen : Prednisone 10 mg take  4 each am x 2 days,   2 each am x 2 days,  1 each am x 2 days and stop  Call with formulary alternatives to symbicort 160 2 every 12  hours (dulera should be one option)   - 03/07/2014 recurrent hemoptysis rec  HRCT chest 03/13/2014 > Significant improvement in the appearance of the lung parenchyma compared to the prior examination ? Boop?     03/27/2014 f/u ov/Rayma Hegg re: GOLD II copd with superimposed AS dz ? alv hem ? Boop/resolved  Chief Complaint  Patient presents with  . Follow-up    Pt states woke up this am with increased SOB and prod cough with green sputum.    doing rehab completely flat x stops p 5 min to let 02 recover Not consistent with resp meds / insurance formulary restrictions not clear  Not using saba prn in any form. > Continue on [place] and [place]orza    04/10/2014 NP  Follow up : GOLD II COPD w/ ? BOOP resolved Patient returns for a two-week follow-up and medication review We reviewed all her medications organize them into a medication count with patient education Appears that she is taking her medications correctly Patient has briefly been in pulmonary rehabilitation with notable drops in her oxygen level with exercise Today in the office, patient with desaturation to 88% with walking room air. Previous 2-D echo in November 2015 showed EF  at 45-50%, with elevated BNP. Around 400 We discussed a cardiology referral for evaluation of possible underlying congestive heart failure. Patient does complain that she snores and has daytime sleepiness, we discussed checking her oxygen level while she was sleeping If positive explained that she may need a sleep test in the future. She denies any chest pain, palpitations, syncope, abdominal pain, vomiting, or increased leg swelling Patient had previous COPD exacerbation versus possible Boop with productive cough and hemoptysis. CT chest in 01/08/2014 showed persistent groundglass infiltrates along the lower lobes and right middle and upper lobe. Previous sedimentation rate 35. She was treated with steroids and antibiotics Symptoms improved and repeat CT in  January show significant improvement in aeration and decreased ground glass attenuation. rec Follow med calendar closely and bring to each visit Labs today We are setting, you up for a overnight oximetry test We are referring you to cardiology for abnormal echo Begin oxygen 2 L with activity.    LHC 05/14/14  1. No significant coronary artery disease 2. Moderate to severely reduced LV systolic function due to nonischemic cardiomyopathy.  3. Moderately elevated left ventricular end-diastolic pressure.    05/22/2014 f/u ov/Laporcha Marchesi re: confused with med names/ taking spiriva bid /not using med calendar provided Chief Complaint  Patient presents with  . Follow-up    Pt states that her breathing has been doing well. She has not needed her rescue inhaler or neb.    Only uses 02 with exertion/ back stops her before the breathing and when exerts uses  2lpm  On recumbent bike does 7 min drops to 90 then puts on 02  rec spiriva is like high octane fuel for a car and you may not need it now > try off  If  breathing gets worse ok to use symbicort 160 up to 12hour as needed and restart spiriva as a maintenance medication See calendar for specific medication instructions and bring it back for each and every office visit for every healthcare provider you see.  Without it,  you may not receive the best quality medical care that we feel you deserve   08/21/2014 f/u ov/Lonzo Saulter re: GOLD II copd off all resp meds /  Mild chronic cough on acei  Chief Complaint  Patient presents with  . Follow-up    Pt states that her cough is much improved. She still feels tickle in her throat.   sense of  pnds is day > noct with urge to clear throat disprorportionate to mucus production which is minimal.  rec Stop prinivil and start diovan(valsartan) 160 mg one daily and the tickle/cough should gradually resolve  Please schedule a follow up office visit in 6 weeks, call sooner if needed pfts on return    10/09/2014 f/u  ov/Andilynn Delavega re: cough/ leg swelling / GOLD II copd no maint rx/ on coreg 50 mg daily   Chief Complaint  Patient presents with  . Follow-up    PFT done today. Pt states her breathing is unchanged. She c/o hoarsenss and cough for the past 2 days- prod with min green sputum.    Better off acei/ leg swelling asymmetric L > R since off ace better with diuresis  Walk acoss driveway/ parking lot ok / no regular exercise   Has saba but not using - denies any worse off all maint rx    No obvious day to day or daytime variability or assoc cp or chest tightness, subjective wheeze or overt sinus or hb symptoms. No unusual  exp hx or h/o childhood pna/ asthma or knowledge of premature birth.  Sleeping ok without nocturnal  or early am exacerbation  of respiratory  c/o's or need for noct saba. Also denies any obvious fluctuation of symptoms with weather or environmental changes or other aggravating or alleviating factors except as outlined above   Current Medications, Allergies, Complete Past Medical History, Past Surgical History, Family History, and Social History were reviewed in [employer] record.  ROS  The following are not active complaints unless bolded sore throat, dysphagia, dental problems, itching, sneezing,  nasal congestion or excess/ purulent secretions, ear ache,   fever, chills, sweats, unintended wt loss, classically pleuritic or exertional cp, hemoptysis,  orthopnea pnd or leg swelling, presyncope, palpitations, abdominal pain, anorexia, nausea, vomiting, diarrhea  or change in bowel or bladder habits, change in stools or urine, dysuria,hematuria,  rash, arthralgias, visual complaints, headache, numbness, weakness or ataxia or problems with walking or coordination,  change in mood/affect or memory.               Objective:   Physical Exam  amb obese hoarse  wf nad   11/27/2013     237 >   01/08/2014  236 > 01/21/2014 241 >235 02/21/2014 > 02/26/2014  232 > 03/27/2014  236  >236 04/10/2014 > 05/22/2014   237 >  08/21/2014 245 > 10/09/2014 257   Vital signs reviewed   HEENT: nl dentition, turbinates, and orophanx. Nl external ear canals without cough reflex   NECK :  without JVD/Nodes/TM/ nl carotid upstrokes bilaterally   LUNGS: no acc muscle use, clear to A and P bilaterally without cough on insp or exp maneuvers   CV:  RRR  no s3 or murmur or increase in P2,   L >> R LE pitting edema   ABD:  soft and nontender with nl excursion in the supine position. No bruits or organomegaly, bowel sounds nl  MS:  warm without deformities, calf tenderness, cyanosis or clubbing  SKIN: warm and dry without lesions      I personally reviewed images and agree with radiology impression as follows:  CXR:  06/16/14  COPD. There is no CHF, pneumonia, nor other acute cardiopulmonary abnormality.    Assessment:

## 2014-10-09 NOTE — Patient Instructions (Addendum)
zpak   GERD (REFLUX)  is an extremely common cause of respiratory symptoms just like yours , many times with no obvious heartburn at all.    It can be treated with medication, but also with lifestyle changes including elevation of the head of your bed (ideally with 6 inch  bed blocks),  Smoking cessation, avoidance of late meals, excessive alcohol, and avoid fatty foods, chocolate, peppermint, colas, red wine, and acidic juices such as orange juice.  NO MINT OR MENTHOL PRODUCTS SO NO COUGH DROPS  USE SUGARLESS CANDY INSTEAD (Jolley ranchers or Stover's or Life Savers) or even ice chips will also do - the key is to swallow to prevent all throat clearing. NO OIL BASED VITAMINS - use powdered substitutes.   Please see patient coordinator before you leave today  to schedule venous dopplers asap  Please schedule a follow up visit in 3 months but call sooner if needed

## 2014-10-09 NOTE — Progress Notes (Signed)
PFT performed today. 

## 2014-10-10 ENCOUNTER — Telehealth: Payer: Self-pay | Admitting: Cardiology

## 2014-10-10 ENCOUNTER — Other Ambulatory Visit: Payer: BLUE CROSS/BLUE SHIELD

## 2014-10-10 NOTE — Progress Notes (Signed)
Quick Note:  Spoke with pt and notified of results per Dr. Wert. Pt verbalized understanding and denied any questions.  ______ 

## 2014-10-10 NOTE — Telephone Encounter (Signed)
New message     Pt is due to have labs draw today.  She want [place] to fax the order to her PCP ----Dr Blane Ohara [phone], fax [phone] so that she can have them drawn there.  Please call pt when order is faxed

## 2014-10-10 NOTE — Telephone Encounter (Signed)
Lm on pt phone that order for labs, BMET,Ferritin and Protein Electrophoresis (serum) with diagnosis 425.4; 401.1, 428.22.  Faxed to Dr. Mickey Farber at [phone] office.

## 2014-10-11 ENCOUNTER — Encounter: Payer: Self-pay | Admitting: Internal Medicine

## 2014-10-11 NOTE — Assessment & Plan Note (Signed)
Venous dopplers 10/09/14 > neg bilaterally   rx as chf as per cards

## 2014-10-11 NOTE — Assessment & Plan Note (Signed)
Body mass index is 43.45 kg/(m^2).  Lab Results  Component Value Date   TSH 1.68 10/08/2013     Contributing to gerd tendency/ doe/reviewed need  achieve and maintain neg calorie balance > defer f/u primary care including intermittently monitoring thyroid status

## 2014-10-11 NOTE — Assessment & Plan Note (Signed)
Concerned with use of higher doses of coreg in setting of GOLD II copd with poorly controlled symptoms- Strongly prefer in this setting: Bystolic, the most beta -1  selective Beta blocker available in sample form, with bisoprolol the most selective generic choice  on the market.

## 2014-10-14 ENCOUNTER — Encounter: Payer: Self-pay | Admitting: Cardiology

## 2014-10-22 ENCOUNTER — Institutional Professional Consult (permissible substitution): Payer: BLUE CROSS/BLUE SHIELD | Admitting: Internal Medicine

## 2014-10-29 ENCOUNTER — Other Ambulatory Visit: Payer: Self-pay | Admitting: Cardiology

## 2014-10-29 ENCOUNTER — Ambulatory Visit (INDEPENDENT_AMBULATORY_CARE_PROVIDER_SITE_OTHER): Payer: BLUE CROSS/BLUE SHIELD | Admitting: Internal Medicine

## 2014-10-29 ENCOUNTER — Encounter: Payer: Self-pay | Admitting: Internal Medicine

## 2014-10-29 VITALS — BP 122/88 | HR 80 | Ht 65.0 in | Wt 259.0 lb

## 2014-10-29 DIAGNOSIS — I5022 Chronic systolic (congestive) heart failure: Secondary | ICD-10-CM

## 2014-10-29 DIAGNOSIS — I420 Dilated cardiomyopathy: Secondary | ICD-10-CM | POA: Diagnosis not present

## 2014-10-29 NOTE — Patient Instructions (Signed)
Medication Instructions:  Your physician recommends that you continue on your current medications as directed. Please refer to the Current Medication list given to you today.   Labwork: None ordered   Testing/Procedures: None ordered   Follow-Up: Your physician recommends that you schedule a follow-up appointment as needed    Any Other Special Instructions Will Be Listed Below (If Applicable).   

## 2014-10-29 NOTE — Progress Notes (Signed)
Electrophysiology Office Note   Date:  10/29/2014   ID:  EVALYNN [name], DOB 06-20-1960, MRN [id number]  PCP:  Blane Ohara, MD  Cardiologist:  Dr Mayford Knife Primary Electrophysiologist: Hillis Range, MD    Chief Complaint  Patient presents with  . Dilated Cardiomyopathy     History of Present Illness: Judy [name] is a 54 y.o. female who presents today for electrophysiology evaluation.   She reports initially being diagnosed with CHF 3/16 after presenting with progressive shortness of breath.  She underwent cath 05/14/14 which showed no CAD and EF 30%.  She has been treated with an optimal medical regimen.  Her SOB has improved. She is not very active.  She reports dypsnea with mild to moderate activity.  She is primarily limited by back pain.  She is also overweight.  + edma (mild) chronically. She underwent MRI 09/20/14 which revealed EF 28%.  She snores and has daytime fatigue.  Today, she denies symptoms of palpitations, chest pain, orthopnea, PND, claudication, dizziness, presyncope, syncope, bleeding, or neurologic sequela. The patient is tolerating medications without difficulties and is otherwise without complaint today.    Past Medical History  Diagnosis Date  . COPD (chronic obstructive pulmonary disease)   . Hypertension   . Obesity   . Former tobacco use   . Scoliosis   . Chronic systolic CHF (congestive heart failure), NYHA class 2 10/07/2014  . DCM (dilated cardiomyopathy) 04/25/2014    EF 28% by MRI despite maximum medical therapy  . Hyperlipidemia    Past Surgical History  Procedure Laterality Date  . Harrington rod scoliosis      1981  . Fasciotomy      1993 for platar fasciitis  . Umbilical hernia repair    . Vesicovaginal fistula closure w/ tah    . Carpal tunnel release    . Rotator cuff repair    . Ankle artery involved cyst removal    . Abdominal hysterectomy    . Left heart catheterization with coronary angiogram N/A 05/14/2014    Procedure: LEFT  HEART CATHETERIZATION WITH CORONARY ANGIOGRAM;  Surgeon: Iran Ouch, MD;  Location: [facility]ATH [facility];  Service: Cardiovascular;  Laterality: N/A;  . Cardiac catheterization  05/14/2014    normal coronary arteries     Current Outpatient Prescriptions  Medication Sig Dispense Refill  . aspirin 81 MG tablet Take 81 mg by mouth at bedtime.     Marland Kitchen atorvastatin (LIPITOR) 20 MG tablet Take 1 tablet (20 mg total) by mouth daily at 6 PM. 90 tablet 3  . calcium citrate-vitamin D (CITRACAL+D) 315-200 MG-UNIT per tablet Take 1 tablet by mouth daily.    . carvedilol (COREG) 25 MG tablet Take 1 tablet (25 mg total) by mouth 2 (two) times daily. 180 tablet 3  . Cholecalciferol (VITAMIN D-3) 1000 UNITS CAPS Take 1 capsule by mouth daily.    . furosemide (LASIX) 40 MG tablet Take 1 tablet (40 mg total) by mouth daily. 90 tablet 3  . Gabapentin, Once-Daily, (GRALISE PO) Take 1,800 mg by mouth at bedtime.    . Lactobacillus (ACIDOPHILUS) CAPS capsule Take 1 capsule by mouth daily.    . Lutein-Zeaxanthin 25-5 MG CAPS Take 1 capsule by mouth at bedtime.     Marland Kitchen MAGNESIUM PO Take 1 tablet by mouth daily. Pt takes Magnesium malate    . Multiple Vitamin (MULTIVITAMIN WITH MINERALS) TABS tablet Take 1 tablet by mouth daily.    . OxyCODONE HCl 7.5 MG TABA Take 7.5  mg by mouth as needed.    . potassium chloride SA (K-DUR,KLOR-CON) 20 MEQ tablet Take 20 mEq by mouth daily.    Marland Kitchen spironolactone (ALDACTONE) 25 MG tablet Take 1 tablet (25 mg total) by mouth daily. 30 tablet 6  . tobramycin-dexamethasone (TOBRADEX) ophthalmic ointment Place 1 application into the left eye as directed. Use 4 times a day for one week an then 2 times a day for one week    . topiramate (TOPAMAX) 50 MG tablet Take 50 mg by mouth daily as needed (migraine).     . valsartan (DIOVAN) 160 MG tablet Take 1 tablet (160 mg total) by mouth daily. 90 tablet 3  . venlafaxine XR (EFFEXOR-XR) 150 MG 24 hr capsule Take 150 mg by mouth daily with breakfast.      No current facility-administered medications for this visit.    Allergies:   Demerol   Social History:  The patient  reports that she quit smoking about 9 years ago. Her smoking use included Cigarettes. She has a 15 pack-year smoking history. She has never used smokeless tobacco. She reports that she drinks alcohol. She reports that she does not use illicit drugs.   Family History:  The patient's  family history includes Allergies in her father; Emphysema in her father; Heart disease (age of onset: 67) in her father; Heart failure in her father.    ROS:  Please see the history of present illness.   All other systems are reviewed and negative.    PHYSICAL EXAM: VS:  BP 122/88 mmHg  Pulse 80  Ht 5\' 5"  (1.651 m)  Wt 117.482 kg (259 lb)  BMI 43.10 kg/m2 , BMI Body mass index is 43.1 kg/(m^2). GEN: overweight in no acute distress HEENT: normal Neck: no JVD, carotid bruits, or masses Cardiac: RRR; no murmurs, rubs, or gallops,no edema  Respiratory:  clear to auscultation bilaterally, normal work of breathing GI: soft, nontender, nondistended, + BS MS: no deformity or atrophy Skin: warm and dry  Neuro:  Strength and sensation are intact Psych: euthymic mood, full affect  EKG:  EKG is ordered today The ekg ordered today shows sinus rhythm 79 bpm, anterior infarct, QRS 100 msec   Recent Labs: 04/10/2014: Pro B Natriuretic peptide (BNP) 498.0* 05/13/2014: Hemoglobin 14.1; Platelets 317.0 09/03/2014: ALT 20 10/01/2014: BUN 16; Creatinine, Ser 0.65; Potassium 3.8; Sodium 141    Lipid Panel     Component Value Date/Time   CHOL 194 09/03/2014 0856   TRIG 145.0 09/03/2014 0856   HDL 54.40 09/03/2014 0856   CHOLHDL 4 09/03/2014 0856   VLDL 29.0 09/03/2014 0856   LDLCALC 111* 09/03/2014 0856     Wt Readings from Last 3 Encounters:  10/29/14 117.482 kg (259 lb)  10/09/14 116.574 kg (257 lb)  10/07/14 117.391 kg (258 lb 12.8 oz)      Other studies Reviewed: Additional  studies/ records that were reviewed today include: Dr Malachy Mood notes, cath, MRI  Review of the above records today demonstrates: as above   ASSESSMENT AND PLAN:  1.  Nonischemic CM/ Chronic systolic dysfunction The patient has a nonischemic CM (EF 28%), NYHA Class II/III CHF, morbid obesity and COPD. At this time, she meets SCD-HeFT criteria for ICD implantation for primary prevention of sudden death.  We did discuss both DEFINITE and DANISH studies which suggest that nonischemic patients may have a reduced benefit from ICD implantation for primary prevention. We also discussed her multiple orthopedic complaints and requirement of MRI.  We discussed MRI  compatible ICD as a newer option but with less long term data.   Risks, benefits, alternatives to ICD implantation were discussed in detail with the patient today. At this time, she would like to continue medical therapy and lifestyle modification. I have advised weight reduction.  She also has a sleep study planned. If she decides to discuss ICD implant further in the future, I would be happy to see her back in the office at that time.  Follow-up with Dr Mayford Knife as scheduled  Current medicines are reviewed at length with the patient today.   The patient does not have concerns regarding her medicines.  The following changes were made today:  none   Signed, Hillis Range, MD  10/29/2014 8:48 AM     [facility] [address] [place] [address] [phone] (office) [phone] (fax)

## 2014-10-30 ENCOUNTER — Other Ambulatory Visit: Payer: Self-pay

## 2014-10-30 NOTE — Telephone Encounter (Signed)
Ordered Medications       Disp Refills Start End    lisinopril (PRINIVIL,ZESTRIL) 10 MG tablet (Discontinued) 30 tablet 6 06/10/2014 07/09/2014    Take 1 tablet (10 mg total) by mouth daily. - Oral    Reason for Discontinue: Reorder

## 2014-11-01 ENCOUNTER — Other Ambulatory Visit: Payer: Self-pay | Admitting: Cardiology

## 2014-11-03 NOTE — Telephone Encounter (Signed)
08/21/2014 f/u ov/Wert re: GOLD II copd off all resp meds / Mild chronic cough on acei  Stop prinivil and start diovan(valsartan) 160 mg one daily and the tickle/cough should gradually resolve

## 2014-11-11 ENCOUNTER — Other Ambulatory Visit: Payer: Self-pay

## 2014-11-11 MED ORDER — SPIRONOLACTONE 25 MG PO TABS: 25.0000 mg | ORAL_TABLET | Freq: Every day | ORAL | Status: DC

## 2014-11-11 NOTE — Telephone Encounter (Signed)
Henrietta Dine, RN at 09/23/2014 5:22 PM     Status: Signed       Expand All Collapse All   Informed patient of results and verbal understanding expressed.  Instructed patient to INCREASE COREG to 12.5 BID. Instructed patient to START ALDACTONE 25 mg daily. BMET scheduled for 8/17. Informed patient that the EP scheduler will call her with an appointment. Patient agrees with treatment plan.       Hillis Range, MD at 10/29/2014 8:48 AM  spironolactone (ALDACTONE) 25 MG tablet Take 1 tablet (25 mg total) by mouth daily Current medicines are reviewed at length with the patient today.  The patient does not have concerns regarding her medicines. The following changes were made today: none

## 2014-11-17 ENCOUNTER — Ambulatory Visit (HOSPITAL_BASED_OUTPATIENT_CLINIC_OR_DEPARTMENT_OTHER): Payer: BLUE CROSS/BLUE SHIELD | Attending: Internal Medicine | Admitting: Radiology

## 2014-11-17 DIAGNOSIS — I5022 Chronic systolic (congestive) heart failure: Secondary | ICD-10-CM | POA: Diagnosis present

## 2014-11-17 DIAGNOSIS — J449 Chronic obstructive pulmonary disease, unspecified: Secondary | ICD-10-CM | POA: Diagnosis not present

## 2014-11-17 DIAGNOSIS — R0683 Snoring: Secondary | ICD-10-CM | POA: Diagnosis not present

## 2014-11-17 DIAGNOSIS — Z6841 Body Mass Index (BMI) 40.0 and over, adult: Secondary | ICD-10-CM | POA: Insufficient documentation

## 2014-11-17 DIAGNOSIS — E669 Obesity, unspecified: Secondary | ICD-10-CM | POA: Diagnosis not present

## 2014-11-17 DIAGNOSIS — G4733 Obstructive sleep apnea (adult) (pediatric): Secondary | ICD-10-CM | POA: Insufficient documentation

## 2014-11-17 DIAGNOSIS — R5383 Other fatigue: Secondary | ICD-10-CM | POA: Insufficient documentation

## 2014-11-17 DIAGNOSIS — G4736 Sleep related hypoventilation in conditions classified elsewhere: Secondary | ICD-10-CM | POA: Diagnosis not present

## 2014-11-17 DIAGNOSIS — I10 Essential (primary) hypertension: Secondary | ICD-10-CM | POA: Insufficient documentation

## 2014-11-20 DIAGNOSIS — G4733 Obstructive sleep apnea (adult) (pediatric): Secondary | ICD-10-CM

## 2014-11-20 NOTE — Progress Notes (Signed)
Patient Name: Judy [name], Judy [name] Date: 11/17/2014 Gender: Female D.O.B: 1960/11/01 Age (years): 69 Referring Provider: Nyoka Cowden Height (inches): 65 Interpreting Physician: Cyril Mourning MD, ABSM Weight (lbs): 245 RPSGT: Ulyess Mort BMI: 41 MRN: [id number] Neck Size: 16.00  CLINICAL INFORMATION Sleep Study Type: NPSG  Indication for sleep study: Daytime Fatigue, Fatigue, Hypertension, Obesity, Snoring. She has chronic systolic CHF & moderate COPD  Epworth Sleepiness Score: 7  SLEEP STUDY TECHNIQUE As per the AASM Manual for the Scoring of Sleep and Associated Events v2.3 (April 2016) with a hypopnea requiring 4% desaturations.  The channels recorded and monitored were frontal, central and occipital EEG, electrooculogram (EOG), submentalis EMG (chin), nasal and oral airflow, thoracic and abdominal wall motion, anterior tibialis EMG, snore microphone, electrocardiogram, and pulse oximetry.  MEDICATIONS  Medications self-administered by patient during sleep study : No sleep medicine administered.  SLEEP ARCHITECTURE The study was initiated at 11:16:52 PM and ended at 5:18:42 AM.  Sleep onset time was 14.4 minutes and the sleep efficiency was 86.8%. The total sleep time was 313.9 minutes.  Stage REM latency was 146.5 minutes.  The patient spent 10.03% of the night in stage N1 sleep, 53.33% in stage N2 sleep, 22.78% in stage N3 and 13.86% in REM.  Alpha intrusion was absent.  Supine sleep was 89.17%.  RESPIRATORY PARAMETERS The overall apnea/hypopnea index (AHI) was 26.4 per hour. There were 123 total apneas, including 122 obstructive, 0 central and 1 mixed apneas. There were 15 hypopneas and 37 RERAs.  The AHI during Stage REM sleep was 44.1 per hour.  AHI while supine was 29.6 per hour.  The mean oxygen saturation was 89.13%. The minimum SpO2 during sleep was 70.00%.  Loud snoring was noted during this study.  CARDIAC DATA The 2 lead EKG demonstrated sinus  rhythm. The mean heart rate was 67.76 beats per minute. Other EKG findings include: None.  LEG MOVEMENT DATA The total PLMS were 80 with a resulting PLMS index of 15.29. Associated arousal with leg movement index was 2.9 .  IMPRESSIONS Moderate obstructive sleep apnea occurred during this study (AHI = 26.4/h). No significant central sleep apnea occurred during this study (CAI = 0.0/h). Severe oxygen desaturation was noted during this study (Min O2 = 70.00%) especially during REm sleep which may suggest REM related hypoventilation The patient snored with Loud snoring volume. No cardiac abnormalities were noted during this study. Mild periodic limb movements of sleep occurred during the study. No significant associated arousals.  DIAGNOSIS Obstructive Sleep Apnea (327.23 [G47.33 ICD-10]) Nocturnal Hypoxemia (327.26 [G47.36 ICD-10])  RECOMMENDATIONS Therapeutic CPAP titration to determine optimal pressure required to alleviate sleep disordered breathing. Given cardiopulmonary disease, would suggest attended CPAP ttiration study with use of oxygen if needed during REM sleep. Positional therapy avoiding supine position during sleep. Avoid alcohol, sedatives and other CNS depressants that may worsen sleep apnea and disrupt normal sleep architecture. Sleep hygiene should be reviewed to assess factors that may improve sleep quality. Weight management and regular exercise should be initiated or continued if appropriate.  Oretha Milch MD  Board certified in Sleep medicine

## 2014-11-20 NOTE — Addendum Note (Signed)
Addended by: Cyril Mourning V on: 11/20/2014 05:41 PM   Modules accepted: Level of Service

## 2014-12-01 ENCOUNTER — Other Ambulatory Visit: Payer: Self-pay | Admitting: Cardiology

## 2014-12-30 ENCOUNTER — Telehealth: Payer: Self-pay | Admitting: Internal Medicine

## 2014-12-31 ENCOUNTER — Encounter: Payer: Self-pay | Admitting: Cardiology

## 2014-12-31 ENCOUNTER — Ambulatory Visit (INDEPENDENT_AMBULATORY_CARE_PROVIDER_SITE_OTHER): Payer: BLUE CROSS/BLUE SHIELD | Admitting: Cardiology

## 2014-12-31 VITALS — BP 92/58 | HR 80 | Ht 65.0 in | Wt 264.0 lb

## 2014-12-31 DIAGNOSIS — I420 Dilated cardiomyopathy: Secondary | ICD-10-CM | POA: Diagnosis not present

## 2014-12-31 DIAGNOSIS — G4733 Obstructive sleep apnea (adult) (pediatric): Secondary | ICD-10-CM | POA: Insufficient documentation

## 2014-12-31 DIAGNOSIS — R0600 Dyspnea, unspecified: Secondary | ICD-10-CM

## 2014-12-31 LAB — BASIC METABOLIC PANEL
BUN: 12 mg/dL (ref 7–25)
CO2: 30 mmol/L (ref 20–31)
CREATININE: 0.61 mg/dL (ref 0.50–1.05)
Calcium: 9.5 mg/dL (ref 8.6–10.4)
Chloride: 103 mmol/L (ref 98–110)
Glucose, Bld: 109 mg/dL — ABNORMAL HIGH (ref 65–99)
Potassium: 4.7 mmol/L (ref 3.5–5.3)
Sodium: 141 mmol/L (ref 135–146)

## 2014-12-31 LAB — FERRITIN: FERRITIN: 41 ng/mL (ref 10–291)

## 2014-12-31 MED ORDER — SACUBITRIL-VALSARTAN 24-26 MG PO TABS: 1.0000 | ORAL_TABLET | Freq: Two times a day (BID) | ORAL | Status: DC

## 2014-12-31 MED ORDER — FUROSEMIDE 20 MG PO TABS: 20.0000 mg | ORAL_TABLET | Freq: Every day | ORAL | Status: DC

## 2014-12-31 NOTE — Patient Instructions (Addendum)
Medication Instructions:  Your physician has recommended you make the following change in your medication:  1) STOP DIOVAN 2) START ENTRESTO 24 mg/26 mg TWICE DAILY  3) DECREASE LASIX to 20 mg daily  Labwork: TODAY: Ferritin, Serum Protein Electrophoresis, BMET  Testing/Procedures: Dr. Mayford Knife recommends you have a CPAP TITRATION ASAP. We will follow-up with you after your titration to review results and schedule a check-up with Dr. Mayford Knife.  Follow-Up: Your physician recommends that you schedule a follow-up appointment in the [facility]N Clinic in 1 week.  Your physician recommends that you schedule a follow-up appointment in 4 weeks with a PA or NP.  Any Other Special Instructions Will Be Listed Below (If Applicable). If you gain more than 3 pounds in 1 day or 5 pounds in 3 days call our office for medication management instructions.     If you need a refill on your cardiac medications before your next appointment, please call your pharmacy.

## 2014-12-31 NOTE — Progress Notes (Signed)
Cardiology Office Note   Date:  12/31/2014   ID:  Judy [name], DOB Dec 21, 1960, MRN [id number]  PCP:  Blane Ohara, MD    Chief Complaint  Patient presents with  . Congestive Heart Failure  . Sleep Apnea  . Hypertension      History of Present Illness: Judy [name] is a 54 y.o. female who presents for followup of reduced LVF with EF 45%. She is followed by Dr. Sherene Sires for COPD. She also has had some palpitations in the past but denies any chest pressure or pain. She is on home O2 at night. She has chronic LE edema.She had a nuclear stress test in March which was abnormal and underwent cardiac cath showing no significant coronary disease, moderate to severely reduced LVF c/w nonischemic DCM and elevetd LVEDP. She was changed from Verapamil to Carvedilol.She underwent Cardiac MRI after 3 months of medical therapy which showed persistence of DCM and EF 28% and was seen by EP who recommended AICD for primary prevention of SCD but she has not made a decision yet.  She presents back for followup. She complains of SOB with walking and occasional pressure.  She is tired during the day and has to nap.  She was recently dx with moderate OSA with an AHI of 26/hr with oxygen desaturations as low as 70%.  She denies any palpitations or syncope. She has LE edema that has improved.      Past Medical History  Diagnosis Date  . COPD (chronic obstructive pulmonary disease) (HCC)   . Hypertension   . Obesity   . Former tobacco use   . Scoliosis   . DCM (dilated cardiomyopathy) (HCC) 04/25/2014    EF 28% by MRI despite maximum medical therapy  . Hyperlipidemia   . Chronic systolic CHF (congestive heart failure), NYHA class 3 (HCC) 10/07/2014  . OSA (obstructive sleep apnea)     moderate with AHI 26/hr with oxygen desaturations as low as 70%    Past Surgical History  Procedure Laterality Date  . Harrington rod scoliosis      1981  . Fasciotomy      1993 for  platar fasciitis  . Umbilical hernia repair    . Vesicovaginal fistula closure w/ tah    . Carpal tunnel release    . Rotator cuff repair    . Ankle artery involved cyst removal    . Abdominal hysterectomy    . Left heart catheterization with coronary angiogram N/A 05/14/2014    Procedure: LEFT HEART CATHETERIZATION WITH CORONARY ANGIOGRAM;  Surgeon: Iran Ouch, MD;  Location: [facility];  Service: Cardiovascular;  Laterality: N/A;  . Cardiac catheterization  05/14/2014    normal coronary arteries     Current Outpatient Prescriptions  Medication Sig Dispense Refill  . aspirin 81 MG tablet Take 81 mg by mouth at bedtime.     Marland Kitchen atorvastatin (LIPITOR) 20 MG tablet Take 1 tablet (20 mg total) by mouth daily at 6 PM. 90 tablet 3  . B Complex-C (SUPER B COMPLEX PO) Take by mouth daily.    . calcium citrate-vitamin D (CITRACAL+D) 315-200 MG-UNIT per tablet Take 1 tablet by mouth daily.    . carvedilol (COREG) 25 MG tablet Take 1 tablet (25 mg total) by mouth 2 (two) times daily. 180 tablet 3  . Cholecalciferol (VITAMIN D-3) 1000 UNITS CAPS Take 1 capsule by mouth daily.    Marland Kitchen  furosemide (LASIX) 20 MG tablet Take 1 tablet (20 mg total) by mouth daily. 30 tablet 11  . Gabapentin Enacarbil (HORIZANT) 600 MG TBCR Take 600 mg by mouth 2 (two) times daily.    . Lactobacillus (ACIDOPHILUS) CAPS capsule Take 1 capsule by mouth daily.    . Lutein-Zeaxanthin 25-5 MG CAPS Take 1 capsule by mouth at bedtime.     Marland Kitchen MAGNESIUM PO Take 1 tablet by mouth daily. Pt takes Magnesium malate    . morphine (MSIR) 15 MG tablet TAKE 1 TABLET BY MOUTH 3 TIMES A DAY (30 DAYS)  0  . Multiple Vitamin (MULTIVITAMIN WITH MINERALS) TABS tablet Take 1 tablet by mouth daily.    . potassium chloride SA (K-DUR,KLOR-CON) 20 MEQ tablet Take 20 mEq by mouth daily.    . Potassium Gluconate 595 MG CAPS Take 595 mg by mouth daily.    Marland Kitchen spironolactone (ALDACTONE) 25 MG tablet Take 1 tablet (25 mg total) by mouth daily. 90 tablet 1   . topiramate (TOPAMAX) 50 MG tablet Take 50 mg by mouth daily as needed (migraine).     . sacubitril-valsartan (ENTRESTO) 24-26 MG Take 1 tablet by mouth 2 (two) times daily. 60 tablet 11   No current facility-administered medications for this visit.    Allergies:   Demerol    Social History:  The patient  reports that she quit smoking about 9 years ago. Her smoking use included Cigarettes. She has a 15 pack-year smoking history. She has never used smokeless tobacco. She reports that she drinks alcohol. She reports that she does not use illicit drugs.   Family History:  The patient's family history includes Allergies in her father; Emphysema in her father; Heart disease (age of onset: 3) in her father; Heart failure in her father.    ROS:  Please see the history of present illness.   Otherwise, review of systems are positive for none.   All other systems are reviewed and negative.    PHYSICAL EXAM: VS:  BP 92/58 mmHg  Pulse 80  Ht 5\' 5"  (1.651 m)  Wt 119.75 kg (264 lb)  BMI 43.93 kg/m2 , BMI Body mass index is 43.93 kg/(m^2). GEN: Well nourished, well developed, in no acute distress HEENT: normal Neck: no JVD, carotid bruits, or masses Cardiac: RRR; no murmurs, rubs, or gallops,no edema  Respiratory:  clear to auscultation bilaterally, normal work of breathing GI: soft, nontender, nondistended, + BS MS: no deformity or atrophy Skin: warm and dry, no rash Neuro:  Strength and sensation are intact Psych: euthymic mood, full affect   EKG:  EKG is not ordered today.  Recent Labs: 04/10/2014: Pro B Natriuretic peptide (BNP) 498.0* 05/13/2014: Hemoglobin 14.1; Platelets 317.0 09/03/2014: ALT 20 10/01/2014: BUN 16; Creatinine, Ser 0.65; Potassium 3.8; Sodium 141    Lipid Panel    Component Value Date/Time   CHOL 194 09/03/2014 0856   TRIG 145.0 09/03/2014 0856   HDL 54.40 09/03/2014 0856   CHOLHDL 4 09/03/2014 0856   VLDL 29.0 09/03/2014 0856   LDLCALC 111* 09/03/2014 0856       Wt Readings from Last 3 Encounters:  12/31/14 119.75 kg (264 lb)  11/17/14 111.131 kg (245 lb)  10/29/14 117.482 kg (259 lb)    ASSESSMENT AND PLAN:  1. Nonischemic DCM EF 27% on nuclear stress test, 45% on echo and 30% on cath with no CAD on recent cath. She will continue BB/aldactone/Lasix and ARB. Cardiac MRI showed persistent LV dysfunction (EF 28%) and she  was referred to EP for prophylactic AICD. She decided she wanted to continue medical therapy for now.  She was supposed to have a ferritin level and protein electrophoresis but I cannot find that this was ever done so will reorder. 2. Chronic systolic CHF- She still is having Class III symptoms.   I have instructed her to weigh herself daily first thing in the am and record daily. She is to call with weight gain >3lbs in 1 day for 5lbs in a week. I am going to discontinue Diovn and start Entresto 24/26mg  BID. Continue Lasix but decrease to  daily since we are starting Entresto and BP is on the soft side. She will followup in HTN clinic in 1 week.   3. COPD followed by pulmonary 4. HTN - controlled on BB 5. Dyslipidemia with last LDL 111 - continue statin  6.  Moderate sleep apnea - I will get her set up for CPAP titration.      Current medicines are reviewed at length with the patient today.  The patient does not have concerns regarding medicines.  The following changes have been made:  no change  Labs/ tests ordered today: See above Assessment and Plan  Orders Placed This Encounter  Procedures  . Ferritin  . Protein Electrophoresis, (serum)  . Basic metabolic panel  . Cpap titration     Disposition:   FU with  in 4 weeks with me or extedner  Harlon Flor, MD  12/31/2014 10:49 AM    [facility] [address] [place], [place], [place]  [address] Phone: [phone]; Fax: [phone]

## 2015-01-01 ENCOUNTER — Encounter: Payer: Self-pay | Admitting: *Deleted

## 2015-01-01 NOTE — Telephone Encounter (Signed)
Called and spoke with pt. She stated she had not received sleep study results. Sleep study was done on 11/17/2014. She stated that she had an ov with Dr. Mayford Knife on 12/31/14 and discussed the results at that ov. Dr. Mayford Knife has setup with a DME company and scheduled a CPAP titration study for 01/29/15. Pt wanted MW to be aware that Dr. Mayford Knife was setting her up with a CPAP. I explained to her I would send message to MW. Pt voiced understanding and had no further questions.  To MW as Lorain Childes

## 2015-01-02 LAB — PROTEIN ELECTROPHORESIS, SERUM
Albumin ELP: 3.6 g/dL — ABNORMAL LOW (ref 3.8–4.8)
Alpha-1-Globulin: 0.3 g/dL (ref 0.2–0.3)
Alpha-2-Globulin: 0.8 g/dL (ref 0.5–0.9)
BETA 2: 0.4 g/dL (ref 0.2–0.5)
BETA GLOBULIN: 0.4 g/dL (ref 0.4–0.6)
Gamma Globulin: 0.7 g/dL — ABNORMAL LOW (ref 0.8–1.7)
TOTAL PROTEIN, SERUM ELECTROPHOR: 6.2 g/dL (ref 6.1–8.1)

## 2015-01-06 ENCOUNTER — Ambulatory Visit (INDEPENDENT_AMBULATORY_CARE_PROVIDER_SITE_OTHER): Payer: BLUE CROSS/BLUE SHIELD | Admitting: Pharmacist

## 2015-01-06 VITALS — BP 108/88 | Wt 265.5 lb

## 2015-01-06 DIAGNOSIS — I10 Essential (primary) hypertension: Secondary | ICD-10-CM

## 2015-01-06 NOTE — Progress Notes (Signed)
Cardiology Office Note   Date:  01/06/2015   ID:  Judy [name], DOB [birth year], MRN [id number]  PCP:  Blane Ohara, MD    Chief Complaint  Patient presents with  . Hypertension      History of Present Illness: Judy [name] is a 54 y.o. female who was referred to the pharmacy clinic by Dr. Mayford Knife for BP check.  She was seen by Dr. Mayford Knife on 12/31/14.  She has a PMH significant for reduced LVF with EF 45%, COPD, OSA, and chronic LE edema.  At her visit she complained of dyspnea on exertion and feeling more tired.  Dr. Mayford Knife switched her Diovan to [facility] to help with her HF symptoms.  She did start at a lower dose of Entresto due to low BP in office that day.   Pt is here today for BP follow up.  She states she has not seen much of a difference in her symptoms over the past week.  Her LE edema is better than usual.  She does weigh herself at home and this is stable.  She does not check her BP at home.  She did mention a few episodes of dizziness but they were not related to positional changes.  She states this occurred prior to the medication change as well and there has not been any change in frequency.   Current Medications Carvediolol  BID  Spironolactone  daily Entresto 24/26 mg BID Furosemide  daily  BP Readings from Last 3 Encounters:  01/06/15 108/88  12/31/14 92/58  10/29/14 122/88     Past Medical History  Diagnosis Date  . COPD (chronic obstructive pulmonary disease) (HCC)   . Hypertension   . Obesity   . Former tobacco use   . Scoliosis   . DCM (dilated cardiomyopathy) (HCC) 04/25/2014    EF 28% by MRI despite maximum medical therapy  . Hyperlipidemia   . Chronic systolic CHF (congestive heart failure), NYHA class 3 (HCC) 10/07/2014  . OSA (obstructive sleep apnea)     moderate with AHI 26/hr with oxygen desaturations as low as 70%    Current Outpatient Prescriptions  Medication Sig Dispense Refill  . aspirin 81 MG  tablet Take 81 mg by mouth at bedtime.     Marland Kitchen atorvastatin (LIPITOR) 20 MG tablet Take 1 tablet (20 mg total) by mouth daily at 6 PM. 90 tablet 3  . B Complex-C (SUPER B COMPLEX PO) Take by mouth daily.    . calcium citrate-vitamin D (CITRACAL+D) 315-200 MG-UNIT per tablet Take 1 tablet by mouth daily.    . carvedilol (COREG) 25 MG tablet Take 1 tablet (25 mg total) by mouth 2 (two) times daily. 180 tablet 3  . Cholecalciferol (VITAMIN D-3) 1000 UNITS CAPS Take 1 capsule by mouth daily.    . furosemide (LASIX) 20 MG tablet Take 1 tablet (20 mg total) by mouth daily. 30 tablet 11  . Gabapentin Enacarbil (HORIZANT) 600 MG TBCR Take 600 mg by mouth 2 (two) times daily.    . Lactobacillus (ACIDOPHILUS) CAPS capsule Take 1 capsule by mouth daily.    . Lutein-Zeaxanthin 25-5 MG CAPS Take 1 capsule by mouth at bedtime.     Marland Kitchen MAGNESIUM PO Take 1 tablet by mouth daily. Pt takes Magnesium malate    . morphine (MSIR) 15 MG tablet TAKE 1 TABLET BY MOUTH 3 TIMES A DAY (30 DAYS)  0  .  Multiple Vitamin (MULTIVITAMIN WITH MINERALS) TABS tablet Take 1 tablet by mouth daily.    . potassium chloride SA (K-DUR,KLOR-CON) 20 MEQ tablet Take 20 mEq by mouth daily.    . Potassium Gluconate 595 MG CAPS Take 595 mg by mouth daily.    . sacubitril-valsartan (ENTRESTO) 24-26 MG Take 1 tablet by mouth 2 (two) times daily. 60 tablet 11  . spironolactone (ALDACTONE) 25 MG tablet Take 1 tablet (25 mg total) by mouth daily. 90 tablet 1  . topiramate (TOPAMAX) 50 MG tablet Take 50 mg by mouth daily as needed (migraine).      No current facility-administered medications for this visit.    Allergies:   Demerol   ASSESSMENT AND PLAN:  1. Hypertension/CHF- Pt's BP better today than it was last week at OV.  She does not seem to have symptoms of orthostasis.  She does have a BP machine at home and can check more often.  Will not make any changes today since BP has improved.  Labs checked last week and stable.  Pt has appt to  follow up with APP in 3 weeks.   [facility]  01/06/2015 10:01 AM    [facility] [address] [place], [place], [place]  [address] Phone: [phone]; Fax: [phone]

## 2015-01-12 ENCOUNTER — Encounter: Payer: Self-pay | Admitting: Internal Medicine

## 2015-01-12 ENCOUNTER — Ambulatory Visit (INDEPENDENT_AMBULATORY_CARE_PROVIDER_SITE_OTHER): Payer: BLUE CROSS/BLUE SHIELD | Admitting: Internal Medicine

## 2015-01-12 VITALS — BP 116/70 | HR 95 | Ht 62.0 in | Wt 268.2 lb

## 2015-01-12 DIAGNOSIS — R0600 Dyspnea, unspecified: Secondary | ICD-10-CM

## 2015-01-12 DIAGNOSIS — J449 Chronic obstructive pulmonary disease, unspecified: Secondary | ICD-10-CM | POA: Diagnosis not present

## 2015-01-12 DIAGNOSIS — J9611 Chronic respiratory failure with hypoxia: Secondary | ICD-10-CM

## 2015-01-12 DIAGNOSIS — G4733 Obstructive sleep apnea (adult) (pediatric): Secondary | ICD-10-CM

## 2015-01-12 DIAGNOSIS — I10 Essential (primary) hypertension: Secondary | ICD-10-CM

## 2015-01-12 NOTE — Patient Instructions (Addendum)
Wear 02 at 2lpm at bedtime for now - when they do your cpap titration you may or may not still need supplemental 02 but they will tell you if you do   Keep up with appts to do cpap titration and follow up with Dr Mayford Knife - your cough with walking is probably due to [facility] as you have a similar problem previously on ACE inhibitor  Pulmonary follow up is as needed

## 2015-01-12 NOTE — Progress Notes (Signed)
Subjective:     Patient ID: Judy [name], female   DOB: May 01, 1960,  MRN: [id number]    Brief patient profile:  54 yowf quit smoking 2007  @  150 lb  with cough that resolved and no resp problems until winter 2015 with doe x walking the dog s much in terms of cough then 10/04/13 sudden sense she couldn't get a breath and coughed up blood x one tsp plus slt green mucus  > better with saba and started on inhalers/ abx  >  better and pred taper and dx of GOLD II copd was made 11/2013 and non ischemic cardiomyopathy 05/14/14.   History of Present Illness  10/08/2013 1st [facility] office visit/ Judy [name] Chief Complaint  Patient presents with  . Pulmonary Consult    Referred by [facility] Hom[facility] Cox-sob with exertion x 6-8 mths.,worse since 4 days ago,occass. cough-tsp. of blood 4 days ago,usually unprod.,no wheezing,midchest tightness,no fcs,Had neb. since yesterday(used 2x yesterday)   baseline = 157ft   to mailbox and sob when gets there.  rec Clonidine 0.1 mg twice daily until you return  Plan A = automatic = symbiocort 160 Take 2 puffs first thing in am and then another 2 puffs about 12 hours later.  Plan B = Only use your albuterol (proair)as a rescue medication    Plan C = nebulizer albuterol, ok to use up to every 4 hours if can't get relief from Plan B GERD diet     02/26/2014 f/u ov/Judy [name] re: f/ u ? pna inpt with copd GOLD II maint on symbicort 160 2bid  Chief Complaint  Patient presents with  . Follow-up    cxr done today. Pt states that her breathing is doing some better. Her cough is some better- still producing some sputum- clear and frothy.   No more green or bloody sputum Still a little sob and slt prod cough  Esp in am but nothing purulent or bloody  No need for any rescue rx  rec If cough/ wheeze / short of breath worsen : Prednisone 10 mg take  4 each am x 2 days,   2 each am x 2 days,  1 each am x 2 days and stop  Call with formulary alternatives to symbicort 160 2 every 12  hours (dulera should be one option)   - 03/07/2014 recurrent hemoptysis rec  HRCT chest 03/13/2014 > Significant improvement in the appearance of the lung parenchyma compared to the prior examination ? Boop?     03/27/2014 f/u ov/Judy [name] re: GOLD II copd with superimposed AS dz ? alv hem ? Boop/resolved  Chief Complaint  Patient presents with  . Follow-up    Pt states woke up this am with increased SOB and prod cough with green sputum.    doing rehab completely flat x stops p 5 min to let 02 recover Not consistent with resp meds / insurance formulary restrictions not clear  Not using saba prn in any form. > Continue on [place] and Tudorza    04/10/2014 NP  Follow up : GOLD II COPD w/ ? BOOP resolved Patient returns for a two-week follow-up and medication review We reviewed all her medications organize them into a medication count with patient education Appears that she is taking her medications correctly Patient has briefly been in pulmonary rehabilitation with notable drops in her oxygen level with exercise Today in the office, patient with desaturation to 88% with walking room air. Previous 2-D echo in November 2015 showed EF  at 45-50%, with elevated BNP. Around 400 We discussed a cardiology referral for evaluation of possible underlying congestive heart failure. Patient does complain that she snores and has daytime sleepiness, we discussed checking her oxygen level while she was sleeping If positive explained that she may need a sleep test in the future. She denies any chest pain, palpitations, syncope, abdominal pain, vomiting, or increased leg swelling Patient had previous COPD exacerbation versus possible Boop with productive cough and hemoptysis. CT chest in 01/08/2014 showed persistent groundglass infiltrates along the lower lobes and right middle and upper lobe. Previous sedimentation rate 35. She was treated with steroids and antibiotics Symptoms improved and repeat CT in  January show significant improvement in aeration and decreased ground glass attenuation. rec Follow med calendar closely and bring to each visit Labs today We are setting, you up for a overnight oximetry test We are referring you to cardiology for abnormal echo Begin oxygen 2 L with activity.    LHC 05/14/14  1. No significant coronary artery disease 2. Moderate to severely reduced LV systolic function due to nonischemic cardiomyopathy.  3. Moderately elevated left ventricular end-diastolic pressure.    05/22/2014 f/u ov/Judy [name] re: confused with med names/ taking spiriva bid /not using med calendar provided Chief Complaint  Patient presents with  . Follow-up    Pt states that her breathing has been doing well. She has not needed her rescue inhaler or neb.    Only uses 02 with exertion/ back stops her before the breathing and when exerts uses  2lpm  On recumbent bike does 7 min drops to 90 then puts on 02  rec spiriva is like high octane fuel for a car and you may not need it now > try off  If  breathing gets worse ok to use symbicort 160 up to 12hour as needed and restart spiriva as a maintenance medication See calendar for specific medication instructions and bring it back for each and every office visit for every healthcare provider you see.  Without it,  you may not receive the best quality medical care that we feel you deserve   08/21/2014 f/u ov/Judy [name] re: GOLD II copd off all resp meds /  Mild chronic cough on acei  Chief Complaint  Patient presents with  . Follow-up    Pt states that her cough is much improved. She still feels tickle in her throat.   sense of  pnds is day > noct with urge to clear throat disprorportionate to mucus production which is minimal.  rec Stop prinivil and start diovan(valsartan) 160 mg one daily and the tickle/cough should gradually resolve      10/09/2014 f/u ov/Judy [name] re: cough/ leg swelling / GOLD II copd no maint rx/ on coreg 50 mg daily   Chief  Complaint  Patient presents with  . Follow-up    PFT done today. Pt states her breathing is unchanged. She c/o hoarsenss and cough for the past 2 days- prod with min green sputum.    Better off acei/ leg swelling asymmetric L > R since off ace better with diuresis  Walk acoss driveway/ parking lot ok / no regular exercise   Has saba but not using - denies any worse off all maint rx  rec zpak  GERD diet  Please see patient coordinator before you leave today  to schedule venous dopplers > neg bilaterally     01/12/2015  f/u ov/Judy [name] re: GOLD II copd/ on no inhalers at all  / dry  cough has recurred on entresto  Chief Complaint  Patient presents with  . Follow-up    pt following for COPD: pt c/o SOB. pt states she doesnt think anything has improved. pt c/ o lots of swelling in the legs. pt c/o dry cough on exertion which can be as little as 3 feet.     Breathing and cough were better now worse again on ACEi  No obvious day to day or daytime variability or assoc excess or purulent sputum  cp or chest tightness, subjective wheeze or overt sinus or hb symptoms. No unusual exp hx or h/o childhood pna/ asthma or knowledge of premature birth.  Sleeping ok without nocturnal  or early am exacerbation  of respiratory  c/o's or need for noct saba. Also denies any obvious fluctuation of symptoms with weather or environmental changes or other aggravating or alleviating factors except as outlined above   Current Medications, Allergies, Complete Past Medical History, Past Surgical History, Family History, and Social History were reviewed in [employer] record.  ROS  The following are not active complaints unless bolded sore throat, dysphagia, dental problems, itching, sneezing,  nasal congestion or excess/ purulent secretions, ear ache,   fever, chills, sweats, unintended wt loss, classically pleuritic or exertional cp, hemoptysis,  orthopnea pnd or leg swelling, presyncope,  palpitations, abdominal pain, anorexia, nausea, vomiting, diarrhea  or change in bowel or bladder habits, change in stools or urine, dysuria,hematuria,  rash, arthralgias, visual complaints, headache, numbness, weakness or ataxia or problems with walking or coordination,  change in mood/affect or memory.               Objective:   Physical Exam  amb obese  wf nad   11/27/2013     237 >   01/08/2014  236 > 01/21/2014 241 >235 02/21/2014 > 02/26/2014  232 > 03/27/2014  236 >236 04/10/2014 > 05/22/2014   237 >  08/21/2014 245 > 10/09/2014 257 >  01/12/2015   268   Vital signs reviewed   HEENT: nl dentition, turbinates, and orophanx. Nl external ear canals without cough reflex   NECK :  without JVD/Nodes/TM/ nl carotid upstrokes bilaterally   LUNGS: no acc muscle use, clear to A and P bilaterally without cough on insp or exp maneuvers   CV:  RRR  no s3 or murmur or increase in P2,   L >> R LE pitting edema   ABD:  soft and nontender with nl excursion in the supine position. No bruits or organomegaly, bowel sounds nl  MS:  warm without deformities, calf tenderness, cyanosis or clubbing  SKIN: warm and dry without lesions      I personally reviewed images and agree with radiology impression as follows:  CXR:  06/16/14  COPD. There is no CHF, pneumonia, nor other acute cardiopulmonary abnormality.    Assessment:

## 2015-01-13 ENCOUNTER — Encounter: Payer: Self-pay | Admitting: Internal Medicine

## 2015-01-13 NOTE — Assessment & Plan Note (Signed)
-   11/27/2013  PFTs   FEV1  1.51 (54%) ratio 52 and and no better p B2 and DLCO  71 - 01/08/2014  p extensive coaching HFA effectiveness =    90% > rec resume symbicort 160 2bid  - 03/27/14 trial of dulera 200/tudorza samples only to assure adherence  04/10/2014   Alpha 1 MM , nml level  - trial off spiriva 05/22/14 / off all resp rx 08/22/14 - PFT's  10/09/2014  FEV1 1.64 (59 % ) ratio 54  p 11 % improvement from saba with DLCO  69 % corrects to 73  % for alv volume    We could rechallenge with respimat spiriva or stiolto but I strongly doubt this will add anything but expense to her care

## 2015-01-13 NOTE — Assessment & Plan Note (Signed)
Walk with desats 88% 04/10/2014 >begin O2 w/ act 2 l/m   rec wear 02 2lpm at hs until has cpap titration

## 2015-01-13 NOTE — Assessment & Plan Note (Signed)
Change acei to arb 08/23/2014 due to cough > resolved > recurred by ov 01/12/2015 on Entresto > rec d/c acei   Defer to Dr Mayford Knife with whom the pt plans f/u

## 2015-01-13 NOTE — Assessment & Plan Note (Addendum)
-   10/08/2013  Walked RA x 2 laps @ 185 ft each stopped due to  Sob and back pain, no desat  - 01/08/2014   Walked RA x one lap @ 185 stopped due to  Sob with desat 89%  - CTa chest 01/08/2014 > new bilateral effusions/ non specific gg changes  - Echo 01/08/2014 > ef 45% with mild MR - 01/21/2014   Walked RA x one lap @ 185 stopped due to  Back gave out, slow pace, no desat  - 01/12/2015   Walked RA  2 laps @ 185 ft each stopped due to  Sob/ slow pace, lowest sat was 89%   Most of her problems with sob are related to obesity and chf though I agree with Dr Mayford Knife she needs a cpap titration next   In meantime need to consider alternative to ACEi as already established the dry cough resolved off it/ will share with Dr Mayford Knife  I had an extended final summary discussion with the patient reviewing all relevant studies completed to date and  lasting 15 to 20 minutes of a 25 minute visit    Each maintenance medication was reviewed in detail including most importantly the difference between maintenance and prns and under what circumstances the prns are to be triggered using an action plan format that is not reflected in the computer generated alphabetically organized AVS.    Please see instructions for details which were reviewed in writing and the patient given a copy highlighting the part that I personally wrote and discussed at today's ov.

## 2015-01-13 NOTE — Assessment & Plan Note (Signed)
Body mass index is 49.04 trending up   Lab Results  Component Value Date   TSH 1.68 10/08/2013     Contributing to gerd tendency/ doe/reviewed the need and the process to achieve and maintain neg calorie balance > defer f/u primary care including intermittently monitoring thyroid status

## 2015-01-13 NOTE — Assessment & Plan Note (Signed)
11/20/14 PSS:   moderate with AHI 26/hr with oxygen desaturations as low as 70%  F/u planned with Dr Mayford Knife to see if cpap titration resolves the desats and if not needs noct 02 titration also .

## 2015-01-29 ENCOUNTER — Ambulatory Visit (HOSPITAL_BASED_OUTPATIENT_CLINIC_OR_DEPARTMENT_OTHER): Payer: BLUE CROSS/BLUE SHIELD | Attending: Cardiology | Admitting: Radiology

## 2015-01-29 DIAGNOSIS — R0683 Snoring: Secondary | ICD-10-CM | POA: Insufficient documentation

## 2015-01-29 DIAGNOSIS — Z79899 Other long term (current) drug therapy: Secondary | ICD-10-CM | POA: Insufficient documentation

## 2015-01-29 DIAGNOSIS — G4730 Sleep apnea, unspecified: Secondary | ICD-10-CM | POA: Diagnosis present

## 2015-01-29 DIAGNOSIS — G4733 Obstructive sleep apnea (adult) (pediatric): Secondary | ICD-10-CM

## 2015-02-01 NOTE — Progress Notes (Signed)
Cardiology Office Note   Date:  02/02/2015   ID:  Judy [name], DOB 02-Mar-1960, MRN [id number]  PCP:  Rochel Brome, MD  Cardiologist:  Dr. Radford Pax   4 week f/u    History of Present Illness: Judy [name] is a 54 y.o. female with a history of NICM/chronic S-CHF (EF 35%), COPD on nocturnal 02, moderate OSA, chronic LE edema and HTN who presents to clinic for follow up.  She is followed by Dr. Melvyn Novas for COPD. She is on home O2 at night. She has chronic LE edema. She had a nuclear stress test in 04/2014 which was abnormal and underwent cardiac cath on 05/14/2014 showing no significant coronary disease, moderate to severely reduced LVF c/w nonischemic DCM and elevated LVEDP. She was changed from Verapamil to Carvedilol. She underwent Cardiac MRI after 3 months of medical therapy which showed persistence of DCM and EF 28% and was seen by Dr. Rayann Heman to discuss ICD placement. He felt that she met SCD-HeFT criteria for ICD implantation for primary prevention of sudden death, but that studies have shown nonischemic patients may have a reduced benefit from ICD implantation for primary prevention. They discussed her multiple orthopedic complaints and requirement of MRIs and that MRI compatible ICD have less long term data.They ultipmately decided that they were going to continue medical therapy and lifestyle modification. Ferritin level and protein electrophoresis were WNL. She was seen by Dr Radford Pax 12/31/14 and complained of SOB with walking and occasional chest pressure. She also complained of lethargy. She was recently dx with moderate OSA with an AHI of 26/hr with oxygen desaturations as low as 70%. She was noted to have NHYA class III symptoms and started on Entresto 24/20m BID (diovan discontinued and Lasix reduced to 280mdaily due to soft BPs). She was seen in the BP clinic on 01/06/15 and her BP had improved and no new recommendations were made.   She presents today for follow up. She has CPAP  mask fitting and wore it one night at the hospital but has to call to get the mask at home. She feels very fatigued still. Still with NHYA class III symptoms. No LE edema currently. She has stable orthopnea no PND. No chest pain but occasionally gets chest pressure with exertion.      Past Medical History  Diagnosis Date  . COPD (chronic obstructive pulmonary disease) ([facility]  . Hypertension   . Obesity   . Former tobacco use   . Scoliosis   . DCM (dilated cardiomyopathy) ([facility]3/12/2014    EF 28% by MRI despite maximum medical therapy  . Hyperlipidemia   . Chronic systolic CHF (congestive heart failure), NYHA class 3 ([facility]8/23/2016  . OSA (obstructive sleep apnea)     moderate with AHI 26/hr with oxygen desaturations as low as 70%    Past Surgical History  Procedure Laterality Date  . Harrington rod scoliosis      1981  . Fasciotomy      1993 for platar fasciitis  . Umbilical hernia repair    . Vesicovaginal fistula closure w/ tah    . Carpal tunnel release    . Rotator cuff repair    . Ankle artery involved cyst removal    . Abdominal hysterectomy    . Left heart catheterization with coronary angiogram N/A 05/14/2014    Procedure: LEFT HEART CATHETERIZATION WITH CORONARY ANGIOGRAM;  Surgeon: MuWellington HampshireMD;  [facility];  Service: Cardiovascular;  Laterality: N/A;  .  Cardiac catheterization  05/14/2014    normal coronary arteries     Current Outpatient Prescriptions  Medication Sig Dispense Refill  . aspirin 81 MG tablet Take 81 mg by mouth at bedtime.     Marland Kitchen atorvastatin (LIPITOR) 20 MG tablet Take 1 tablet (20 mg total) by mouth daily at 6 PM. 90 tablet 3  . B Complex-C (SUPER B COMPLEX PO) Take by mouth daily.    . calcium citrate-vitamin D (CITRACAL+D) 315-200 MG-UNIT per tablet Take 1 tablet by mouth daily.    . carvedilol (COREG) 25 MG tablet Take 1 tablet (25 mg total) by mouth 2 (two) times daily. 180 tablet 3  . Cholecalciferol (VITAMIN D-3) 1000  UNITS CAPS Take 1 capsule by mouth daily.    . furosemide (LASIX) 20 MG tablet Take 1 tablet (20 mg total) by mouth daily. 30 tablet 11  . Gabapentin Enacarbil (HORIZANT) 600 MG TBCR Take 600 mg by mouth 2 (two) times daily.    . Lactobacillus (ACIDOPHILUS) CAPS capsule Take 1 capsule by mouth daily.    . Lutein-Zeaxanthin 25-5 MG CAPS Take 1 capsule by mouth at bedtime.     Marland Kitchen MAGNESIUM PO Take 1 tablet by mouth daily. Pt takes Magnesium malate    . Multiple Vitamin (MULTIVITAMIN WITH MINERALS) TABS tablet Take 1 tablet by mouth daily.    . potassium chloride SA (K-DUR,KLOR-CON) 20 MEQ tablet Take 20 mEq by mouth daily.    . sacubitril-valsartan (ENTRESTO) 24-26 MG Take 1 tablet by mouth 2 (two) times daily. 60 tablet 11  . spironolactone (ALDACTONE) 25 MG tablet Take 1 tablet (25 mg total) by mouth daily. 90 tablet 1  . topiramate (TOPAMAX) 50 MG tablet Take 50 mg by mouth daily as needed (migraine).     . venlafaxine (EFFEXOR) 75 MG tablet Take 75 mg by mouth daily.     No current facility-administered medications for this visit.    Allergies:   Demerol    Social History:  The patient  reports that she quit smoking about 9 years ago. Her smoking use included Cigarettes. She has a 15 pack-year smoking history. She has never used smokeless tobacco. She reports that she drinks alcohol. She reports that she does not use illicit drugs.   Family History:  The patient's family history includes Allergies in her father; Emphysema in her father; Heart disease (age of onset: 45) in her father; Heart failure in her father.    ROS:  Please see the history of present illness.   Otherwise, review of systems are positive for NONE.   All other systems are reviewed and negative.    PHYSICAL EXAM: VS:  BP 110/64 mmHg  Pulse 93  Ht '5\' 5"'  (1.651 m)  Wt 268 lb 12.8 oz (121.927 kg)  BMI 44.73 kg/m2  SpO2 96% , BMI Body mass index is 44.73 kg/(m^2). GEN: Well nourished, well developed, in no acute  distressobese HEENT: normal Neck: no JVD, carotid bruits, or masses Cardiac: RRR; no murmurs, rubs, or gallops,no edema  Respiratory:  clear to auscultation bilaterally, normal work of breathing GI: soft, nontender, nondistended, + BS MS: no deformity or atrophy Skin: warm and dry, no rash Neuro:  Strength and sensation are intact Psych: euthymic mood, full affect   EKG:  EKG is not ordered today.    Recent Labs: 04/10/2014: Pro B Natriuretic peptide (BNP) 498.0* 05/13/2014: Hemoglobin 14.1; Platelets 317.0 09/03/2014: ALT 20 12/31/2014: BUN 12; Creat 0.61; Potassium 4.7; Sodium 141  Lipid Panel    Component Value Date/Time   CHOL 194 09/03/2014 0856   TRIG 145.0 09/03/2014 0856   HDL 54.40 09/03/2014 0856   CHOLHDL 4 09/03/2014 0856   VLDL 29.0 09/03/2014 0856   LDLCALC 111* 09/03/2014 0856      Wt Readings from Last 3 Encounters:  02/02/15 268 lb 12.8 oz (121.927 kg)  01/29/15 255 lb (115.667 kg)  01/12/15 268 lb 3.2 oz (121.655 kg)      Other studies Reviewed: Additional studies/ records that were reviewed today include: ECHO Review of the above records demonstrates:   2D ECHO : 09/03/2014 LV EF: 35% Study Conclusions - Procedure narrative: Transthoracic echocardiography. Image quality was adequate. The study was technically difficult. Intravenous contrast (Definity) was administered. - Left ventricle: Poor endocardial definition even with contrast Consider MRI to quantitate EF. The cavity size was moderately dilated. Wall thickness was normal. The estimated ejection fraction was 35%. - Left atrium: The atrium was mildly dilated. - Atrial septum: No defect or patent foramen ovale was identified.  Cardiac MR: 09/19/14 IMPRESSION: 1. Normal LV size with severe diffuse hypokinesis, EF 28%. 2. Normal RV size and systolic function. 3. No myocardial LGE, so no definitive evidence for prior MI, infiltrative disease, or myocarditis.  ASSESSMENT  AND PLAN:  Judy [name] is a 54 y.o. female with a history of NICM/chronic S-CHF (EF 35%), COPD on nocturnal 02, moderate OSA, chronic LE edema and HTN who presents to clinic for follow up.   Non-ischemic DCM EF 27% on nuclear stress test, 35% on echo and 30% on cath with no CAD on recent cath.  -- Ferritin level and protein electrophoresis were WNL. -- Continue BB/aldactone/Lasix and ARB. Cardiac MRI showed persistent LV dysfunction (EF 28%) and she was referred to EP for prophylactic AICD. She decided she wanted to continue medical therapy for now.   Chronic systolic CHF- She still is having Class III symptoms. Weight stable.  -- I have instructed her to weigh herself daily first thing in the am and record daily. She is to call with weight gain> 3lbs in 1 day for 5lbs in a week.  -- Continue Coreg 66m BID and Entresto 24/230mBID. Continue Lasix 2066maily   COPD followed by pulmonary  HTN - has been soft. Okay since be started on Entresto. Today 110/64. Continue to monitor  Dyslipidemia with last LDL 111 - continue statin   Moderate sleep apnea - CPAP titration. She is calling over today to see if she can get the mask  Obesity- definitely playing a role in SOB   Fatigue- I think that CPAP will help in her chronic fatigue   Current medicines are reviewed at length with the patient today.  The patient does not have concerns regarding medicines.  The following changes have been made:  no change  Labs/ tests ordered today include:  No orders of the defined types were placed in this encounter.     Disposition:   FU with Dr. TurRadford Pax 2 months.   SigRenea Ee2/19/2016 9:39 AM    [facility] [address]  [phone]: [phone]: [phone]

## 2015-02-02 ENCOUNTER — Telehealth: Payer: Self-pay | Admitting: Cardiology

## 2015-02-02 ENCOUNTER — Encounter: Payer: Self-pay | Admitting: Physician Assistant

## 2015-02-02 ENCOUNTER — Ambulatory Visit (INDEPENDENT_AMBULATORY_CARE_PROVIDER_SITE_OTHER): Payer: BLUE CROSS/BLUE SHIELD | Admitting: Physician Assistant

## 2015-02-02 VITALS — BP 110/64 | HR 93 | Ht 65.0 in | Wt 268.8 lb

## 2015-02-02 DIAGNOSIS — I428 Other cardiomyopathies: Secondary | ICD-10-CM

## 2015-02-02 DIAGNOSIS — I429 Cardiomyopathy, unspecified: Secondary | ICD-10-CM

## 2015-02-02 MED ORDER — POTASSIUM CHLORIDE CRYS ER 20 MEQ PO TBCR
20.0000 meq | EXTENDED_RELEASE_TABLET | Freq: Every day | ORAL | Status: DC
Start: 2015-02-02 — End: 2015-11-12

## 2015-02-02 NOTE — Telephone Encounter (Signed)
Pt had successful PAP titration. Please setup appointment in 10 weeks. Please let [facility] know that order for PAP is in EPIC.  Please get an overnight pulse oximetry on CPAP 

## 2015-02-02 NOTE — Telephone Encounter (Signed)
Informed patient that her FU today was to see how she was doing since her medication changes at her last OV with Dr. Mayford Knife a months ago. Also informed patient of titration results (see other 12/19 phone note).  Patient grateful for call back.

## 2015-02-02 NOTE — Addendum Note (Signed)
Addended by: Armanda Magic R on: 02/02/2015 02:19 PM   Modules accepted: Orders

## 2015-02-02 NOTE — Sleep Study (Signed)
   Patient Name: Judy [name], Judy [name] MRN: [id number] Study Date: 01/29/2015 Gender: Female D.O.B: 12/19/1960 Age (years): 48 Referring Provider: Armanda Magic MD, ABSM Interpreting Physician: Armanda Magic MD, ABSM RPSGT: Shelah Lewandowsky  Weight (lbs): 255 Height (inches): 65 BMI: 42 Neck Size: 16.00  CLINICAL INFORMATION The patient is referred for a CPAP titration to treat sleep apnea.   SLEEP STUDY TECHNIQUE As per the AASM Manual for the Scoring of Sleep and Associated Events v2.3 (April 2016) with a hypopnea requiring 4% desaturations. The channels recorded and monitored were frontal, central and occipital EEG, electrooculogram (EOG), submentalis EMG (chin), nasal and oral airflow, thoracic and abdominal wall motion, anterior tibialis EMG, snore microphone, electrocardiogram, and pulse oximetry. Continuous positive airway pressure (CPAP) was initiated at the beginning of the study and titrated to treat sleep-disordered breathing.  MEDICATIONS Medications taken by the patient : ASA, Lipitor, Citracal, Coreg, Vit D3, Gabapentin, Lasix, KCl, Entresto, Aldactone, Topamax, Effexor Medications administered by patient during sleep study : No sleep medicine administered.  TECHNICIAN COMMENTS Comments added by technician: NONE  Comments added by scorer: N/A  RESPIRATORY PARAMETERS Optimal PAP Pressure (cm): 16  AHI at Optimal Pressure (/hr):5.7 Overall Minimal O2 (%):83.00   Supine % at Optimal Pressure (%):100 Minimal O2 at Optimal Pressure (%):89.0    SLEEP ARCHITECTURE The study was initiated at 11:04:45 PM and ended at 5:43:53 AM. Sleep onset time was 15.7 minutes and the sleep efficiency was 80.0%. The total sleep time was 319.5 minutes. The patient spent 8.61% of the night in stage N1 sleep, 64.01% in stage N2 sleep, 0.47% in stage N3 and 26.92% in REM.Stage REM latency was 180.0 minutes Wake after sleep onset was 63.9. Alpha intrusion was absent. Supine sleep was  79.03%.  CARDIAC DATA The 2 lead EKG demonstrated sinus rhythm. The mean heart rate was 71.48 beats per minute. Other EKG findings include: None.  LEG MOVEMENT DATA The total Periodic Limb Movements of Sleep (PLMS) were 120. The PLMS index was 22.54. A PLMS index of <15 is considered normal in adults.  IMPRESSIONS - The optimal PAP pressure was 16 cm of water. - Central sleep apnea was not noted during this titration (CAI = 0.8/h). - Moderate oxygen desaturations were observed during this titration (min O2 = 83.00%). - The patient snored with Loud snoring volume during this titration study. - No cardiac abnormalities were observed during this study. - Mild periodic limb movements were observed during this study. Arousals associated with PLMs were rare.  DIAGNOSIS - Obstructive Sleep Apnea (327.23 [G47.33 ICD-10])  RECOMMENDATIONS - Trial of CPAP therapy on 16 cm H2O with a Small size Resmed Full Face Mask AirFit F10 mask and heated humidification. - Avoid alcohol, sedatives and other CNS depressants that may worsen sleep apnea and disrupt normal sleep architecture. - Sleep hygiene should be reviewed to assess factors that may improve sleep quality. - Weight management and regular exercise should be initiated or continued. - Return to Sleep Center for re-evaluation after 10 weeks of therapy   Quintella Reichert Diplomate, American Board of Sleep Medicine  ELECTRONICALLY SIGNED ON:  02/02/2015, 2:13 PM [facility] PH: [phone]   FX: [phone] ACCREDITED BY THE AMERICAN ACADEMY OF SLEEP MEDICINE

## 2015-02-02 NOTE — Telephone Encounter (Signed)
Informed patient of results and verbal understanding expressed.  Patient wishes to be set up with [facility] since she does not live in this area. Informed patient the office CPAP assistant will talk with [facility] to see what needs to be done. She understands she will receive a call to schedule 10 week OV after set-up.

## 2015-02-02 NOTE — Telephone Encounter (Signed)
New message       Pt was seen today by the PA.  She was under the impression that she was being seen for sleep follow up.  Please call and explain to pt that see sees Dr Mayford Knife for sleep and cardiac issues.

## 2015-02-02 NOTE — Patient Instructions (Signed)
Medication Instructions:  Your Potassium has been refilled and sent to your preferred pharmacy  Labwork: None  Testing/Procedures: None  Follow-Up: Your physician recommends that you schedule a follow-up appointment in: 2 months with Dr. Mayford Knife    Any Other Special Instructions Will Be Listed Below (If Applicable).     If you need a refill on your cardiac medications before your next appointment, please call your pharmacy.

## 2015-02-03 NOTE — Telephone Encounter (Signed)
Spoke with [facility] and I have faxed over everything they have requested to begin service with this patient.   Patient has also been called and informed that everything has been sent. Let her know to call me with any questions

## 2015-04-08 ENCOUNTER — Telehealth: Payer: Self-pay

## 2015-04-08 NOTE — Telephone Encounter (Signed)
Patient calls today inquiring about Dr. Norris Cross NPI #. She is filling out an application for [employer]. I asked her to bring in the forms, and we would finish filling them out, and fax them. She agreed to do that, then tells me she has been out of Entresto for a few days. Samples of Entresto 24-26 2 bottles (2 weeks) provided.

## 2015-04-15 NOTE — Progress Notes (Signed)
Cardiology Office Note   Date:  04/16/2015   ID:  Judy [name], DOB 08-02-1960, MRN [id number]  PCP:  Blane Ohara, MD    Chief Complaint  Patient presents with  . Congestive Heart Failure  . Cardiomyopathy  . Sleep Apnea      History of Present Illness: Judy [name] is a 55 y.o. female who presents for followup of nonischemic DCM with normal coronary arteries by cath with EF 45%.She underwent Cardiac MRI after 3 months of medical therapy which showed persistence of DCM and EF 28%.  Serum Ferritin and Serum protein electrophoresis were normal.  She was seen by EP who recommended AICD for primary prevention of SCD but she chose not to proceed. She is followed by Dr. Sherene Sires for COPD. She is on home O2 at night.She presents back for followup. She has moderate OSA with an AHI of 26/hr with oxygen desaturations as low as 70%.  She underwent CPAP titration to 16cm H2O.  She is tolerating her CPAP well.  She tolerates the mask and feels the pressure is adequate.  Since going on CPAP she has more energy and less daytime sleepiness.  She feels rested in the am when she gets up.  She denies any palpitations or syncope. She has LE edema that has improved. She has chronic DOE which is stable to slightly worse.  She is fairly sedentary at home.  She tries to ride her stationary bike for a few minutes a day.  She has gained 20lbs in the past year.  She denies any chest pain or pressure.  She has some very mild LE edema but it is much improved from what it had been in the past.  She feels her heart speed up with exertion.      Past Medical History  Diagnosis Date  . COPD (chronic obstructive pulmonary disease) (HCC)   . Hypertension   . Obesity   . Former tobacco use   . Scoliosis   . DCM (dilated cardiomyopathy) (HCC) 04/25/2014    EF 28% by MRI despite maximum medical therapy  . Hyperlipidemia   . Chronic systolic CHF (congestive heart failure), NYHA class 3 (HCC)  10/07/2014  . OSA (obstructive sleep apnea)     moderate with AHI 26/hr with oxygen desaturations as low as 70%    Past Surgical History  Procedure Laterality Date  . Harrington rod scoliosis      1981  . Fasciotomy      1993 for platar fasciitis  . Umbilical hernia repair    . Vesicovaginal fistula closure w/ tah    . Carpal tunnel release    . Rotator cuff repair    . Ankle artery involved cyst removal    . Abdominal hysterectomy    . Left heart catheterization with coronary angiogram N/A 05/14/2014    Procedure: LEFT HEART CATHETERIZATION WITH CORONARY ANGIOGRAM;  Surgeon: Iran Ouch, MD;  Location: [facility]ATH [facility];  Service: Cardiovascular;  Laterality: N/A;  . Cardiac catheterization  05/14/2014    normal coronary arteries     Current Outpatient Prescriptions  Medication Sig Dispense Refill  . aspirin 81 MG tablet Take 81 mg by mouth at bedtime.     Marland Kitchen atorvastatin (LIPITOR) 20 MG tablet Take 1 tablet (20 mg total) by mouth daily at 6 PM. 90 tablet 3  . B Complex-C (SUPER B COMPLEX PO) Take 1 tablet  by mouth daily.     . calcium citrate-vitamin D (CITRACAL+D) 315-200 MG-UNIT per tablet Take 1 tablet by mouth daily.    . carvedilol (COREG) 25 MG tablet Take 1 tablet (25 mg total) by mouth 2 (two) times daily. 180 tablet 3  . Cholecalciferol (VITAMIN D-3) 1000 UNITS CAPS Take 1 capsule by mouth daily.    . furosemide (LASIX) 20 MG tablet Take 1 tablet (20 mg total) by mouth daily. 30 tablet 11  . gabapentin (NEURONTIN) 400 MG capsule Take 2 tablets by mouth in the AM and 2 by mouth in the PM  1  . Lactobacillus (ACIDOPHILUS) CAPS capsule Take 1 capsule by mouth daily.    . Lutein-Zeaxanthin 25-5 MG CAPS Take 1 capsule by mouth at bedtime.     . Multiple Vitamin (MULTIVITAMIN WITH MINERALS) TABS tablet Take 1 tablet by mouth daily.    . potassium chloride SA (K-DUR,KLOR-CON) 20 MEQ tablet Take 1 tablet (20 mEq total) by mouth daily. 90 tablet 3  . sacubitril-valsartan (ENTRESTO)  24-26 MG Take 1 tablet by mouth 2 (two) times daily. 60 tablet 11  . spironolactone (ALDACTONE) 25 MG tablet Take 1 tablet (25 mg total) by mouth daily. 90 tablet 1  . venlafaxine XR (EFFEXOR-XR) 150 MG 24 hr capsule Take 150 mg by mouth daily with breakfast.     No current facility-administered medications for this visit.    Allergies:   Demerol    Social History:  The patient  reports that she quit smoking about 10 years ago. Her smoking use included Cigarettes. She has a 15 pack-year smoking history. She has never used smokeless tobacco. She reports that she drinks alcohol. She reports that she does not use illicit drugs.   Family History:  The patient's family history includes Allergies in her father; Emphysema in her father; Heart disease (age of onset: 1) in her father; Heart failure in her father.    ROS:  Please see the history of present illness.   Otherwise, review of systems are positive for none.   All other systems are reviewed and negative.    PHYSICAL EXAM: VS:  BP 128/82 mmHg  Pulse 77  Ht 5\' 5"  (1.651 m)  Wt 275 lb (124.739 kg)  BMI 45.76 kg/m2  SpO2 91% , BMI Body mass index is 45.76 kg/(m^2). GEN: Well nourished, well developed, in no acute distress HEENT: normal Neck: no JVD, carotid bruits, or masses Cardiac: RRR; no murmurs, rubs, or gallops,no edema  Respiratory:  clear to auscultation bilaterally, normal work of breathing GI: soft, nontender, nondistended, + BS MS: no deformity or atrophy Skin: warm and dry, no rash Neuro:  Strength and sensation are intact Psych: euthymic mood, full affect   EKG:  EKG is not ordered today.    Recent Labs: 05/13/2014: Hemoglobin 14.1; Platelets 317.0 09/03/2014: ALT 20 12/31/2014: BUN 12; Creat 0.61; Potassium 4.7; Sodium 141    Lipid Panel    Component Value Date/Time   CHOL 194 09/03/2014 0856   TRIG 145.0 09/03/2014 0856   HDL 54.40 09/03/2014 0856   CHOLHDL 4 09/03/2014 0856   VLDL 29.0 09/03/2014 0856     LDLCALC 111* 09/03/2014 0856      Wt Readings from Last 3 Encounters:  04/16/15 275 lb (124.739 kg)  02/02/15 268 lb 12.8 oz (121.927 kg)  01/29/15 255 lb (115.667 kg)    ASSESSMENT AND PLAN:  [name] is a 55 y.o. female with a history of NICM/chronic S-CHF (EF  35%), COPD on nocturnal 02, moderate OSA, chronic LE edema and HTN who presents to clinic for follow up.   Non-ischemic DCM EF 27% on nuclear stress test, 35% on echo and 30% on cath with no CAD on recent cath.  -- Ferritin level and protein electrophoresis were WNL. -- Continue BB/aldactone/Lasix and ARB. Cardiac MRI showed persistent LV dysfunction (EF 28%) and she was referred to EP for prophylactic AICD. She decided she wanted to continue medical therapy for now.   Chronic systolic CHF- She still is having Class III symptoms. She has gained 20 lbs in the past year but does not appear volume overloaded on exam.   -- Continue Coreg  BID and Entresto 24/26mg  BID. Continue Lasix  daily.  Check BMET.  I will check a BNP since she is having worsening SOB but I suspect that this is more related to weight gain and deconditioning from sedentary state as well as COPD.    COPD followed by pulmonary  HTN - has been soft. Okay since be started on Entresto. Today 110/64. Continue to monitor  Dyslipidemia with last LDL 111 - continue statin   Moderate sleep apnea - She is tolerating her CPAP well.  I will get a d/l from her DME  Obesity- definitely playing a role in SOB      Current medicines are reviewed at length with the patient today. The patient does not have concerns regarding medicines.   Current medicines are reviewed at length with the patient today.  The patient does not have concerns regarding medicines.  The following changes have been made:  no change  Labs/ tests ordered today: See above Assessment and Plan No orders of the defined types were placed in this encounter.     Disposition:    FU with me in 6 months  Signed, Quintella Reichert, MD  04/16/2015 10:55 AM    [facility] [address] [place], [place], [place]  [address] Phone: [phone]; Fax: [phone]

## 2015-04-16 ENCOUNTER — Encounter: Payer: Self-pay | Admitting: Cardiology

## 2015-04-16 ENCOUNTER — Ambulatory Visit (INDEPENDENT_AMBULATORY_CARE_PROVIDER_SITE_OTHER): Payer: BLUE CROSS/BLUE SHIELD | Admitting: Cardiology

## 2015-04-16 VITALS — BP 128/82 | HR 77 | Ht 65.0 in | Wt 275.0 lb

## 2015-04-16 DIAGNOSIS — I10 Essential (primary) hypertension: Secondary | ICD-10-CM | POA: Diagnosis not present

## 2015-04-16 DIAGNOSIS — I5022 Chronic systolic (congestive) heart failure: Secondary | ICD-10-CM

## 2015-04-16 DIAGNOSIS — I420 Dilated cardiomyopathy: Secondary | ICD-10-CM | POA: Diagnosis not present

## 2015-04-16 DIAGNOSIS — G4733 Obstructive sleep apnea (adult) (pediatric): Secondary | ICD-10-CM

## 2015-04-16 LAB — BASIC METABOLIC PANEL
BUN: 12 mg/dL (ref 7–25)
CALCIUM: 9.7 mg/dL (ref 8.6–10.4)
CHLORIDE: 101 mmol/L (ref 98–110)
CO2: 27 mmol/L (ref 20–31)
Creat: 0.68 mg/dL (ref 0.50–1.05)
GLUCOSE: 139 mg/dL — AB (ref 65–99)
Potassium: 4.2 mmol/L (ref 3.5–5.3)
SODIUM: 140 mmol/L (ref 135–146)

## 2015-04-16 NOTE — Patient Instructions (Signed)
Medication Instructions:  Your physician recommends that you continue on your current medications as directed. Please refer to the Current Medication list given to you today.   Labwork: TODAY: BMET, BNP  Testing/Procedures: None  Follow-Up: Your physician recommends that you schedule a follow-up appointment with Dr. Sherene Sires.  Your physician wants you to follow-up in: 6 months with Dr. Mayford Knife. You will receive a reminder letter in the mail two months in advance. If you don't receive a letter, please call our office to schedule the follow-up appointment.   Any Other Special Instructions Will Be Listed Below (If Applicable).     If you need a refill on your cardiac medications before your next appointment, please call your pharmacy.

## 2015-04-17 ENCOUNTER — Encounter: Payer: Self-pay | Admitting: Cardiology

## 2015-04-17 LAB — BRAIN NATRIURETIC PEPTIDE: Brain Natriuretic Peptide: 10.3 pg/mL (ref <100)

## 2015-05-22 ENCOUNTER — Other Ambulatory Visit: Payer: Self-pay

## 2015-05-22 ENCOUNTER — Telehealth: Payer: Self-pay | Admitting: Cardiology

## 2015-05-22 MED ORDER — SACUBITRIL-VALSARTAN 24-26 MG PO TABS: 1.0000 | ORAL_TABLET | Freq: Two times a day (BID) | ORAL | Status: DC

## 2015-05-22 NOTE — Telephone Encounter (Signed)
Spoke with patient just now. I advised her that [facility] did a Benefits investigation for her. She does have a $2600. Deductible for pharmacy. I called [facility] [facility], and they indicate that patient is registered and has an ID #. This Rep said that the deductible does not apply, and that she  should pay only $10. Co/pay. Patient states that it is not what her pharmacy is telling her. I have instructed her to call [facility] to get her ID #, because she lost her card. She must use that same ID #. Will [facility] [place].

## 2015-05-22 NOTE — Telephone Encounter (Signed)
New message      Pt has not taken entresto for a couple of weeks because it is expensive.  Is there something else she can take that is cheaper?

## 2015-05-22 NOTE — Telephone Encounter (Signed)
Spoke with the pt and asked her if she ever had her pharmacy prior Judy [name] through her insurance, to make it more affordable.  Pt states she's not quite sure, but she thinks no. Informed the pt that she should be able to get this lowered to an affordable cost, due to having a diagnosis of CHF.  Informed the pt that I will send this message to our Prior Auth Nurse Judy [name], to review and assist with this medication.  Informed the pt that Judy [name] will follow-up with her prior auth results for Judy [name].  Pt states she has some entresto on-hand, but not much.  Pt verbalized understanding and agrees with this plan.  Pt gracious for all the assistance provided.

## 2015-05-23 ENCOUNTER — Other Ambulatory Visit: Payer: Self-pay | Admitting: Cardiology

## 2015-06-01 ENCOUNTER — Telehealth: Payer: Self-pay

## 2015-06-01 NOTE — Telephone Encounter (Signed)
Letter of appeal and last 2 OV notes sent to [facility].

## 2015-06-30 ENCOUNTER — Other Ambulatory Visit: Payer: Self-pay | Admitting: Cardiology

## 2015-07-07 ENCOUNTER — Telehealth: Payer: Self-pay | Admitting: Cardiology

## 2015-07-07 NOTE — Telephone Encounter (Signed)
New message     Pt c/o medication issue:  1. Name of Medication: Lipitor  2. How are you currently taking this medication (dosage and times per day)? Yes-[facility] the order needs to be clarified cause PCP and Cardiologist sent in both prescription different strength  3. Are you having a reaction (difficulty breathing--STAT)? no  4. What is your medication issue? [facility] needs to strength clarified cause of two different prescription

## 2015-07-07 NOTE — Telephone Encounter (Signed)
Called pharmacy back.  They have a rx for Atorvastatin 10 from PCP and 20 mg from Dr. Mayford Knife.  I called pt and she states that after she saw Dr. Mayford Knife in 04/2015, she had stopped taking the med due to she thought it was causing muscle pain. She had some labs and f/u appt at pcp and she done blood work, and advised pt that her cholesterol was bad and she needed to be back on Atorvastatin and that she had a thyroid problem that she was contributing to the muscle pain, so pt decided to go back on it and pcp called in 10 mg.  She found old bottle of 20 mg and decided to switch it back to what Dr. Mayford Knife prescribed.   [facility] been made aware [facility] is on 20 mg Atorvastatin.

## 2015-07-14 ENCOUNTER — Encounter: Payer: Self-pay | Admitting: Internal Medicine

## 2015-07-14 ENCOUNTER — Ambulatory Visit (INDEPENDENT_AMBULATORY_CARE_PROVIDER_SITE_OTHER): Payer: BLUE CROSS/BLUE SHIELD | Admitting: Internal Medicine

## 2015-07-14 VITALS — BP 118/70 | HR 75 | Ht 62.0 in | Wt 278.0 lb

## 2015-07-14 DIAGNOSIS — G4733 Obstructive sleep apnea (adult) (pediatric): Secondary | ICD-10-CM | POA: Diagnosis not present

## 2015-07-14 DIAGNOSIS — J449 Chronic obstructive pulmonary disease, unspecified: Secondary | ICD-10-CM | POA: Diagnosis not present

## 2015-07-14 DIAGNOSIS — J9611 Chronic respiratory failure with hypoxia: Secondary | ICD-10-CM | POA: Diagnosis not present

## 2015-07-14 MED ORDER — TIOTROPIUM BROMIDE MONOHYDRATE 2.5 MCG/ACT IN AERS
INHALATION_SPRAY | RESPIRATORY_TRACT | Status: DC
Start: 2015-07-14 — End: 2015-11-12

## 2015-07-14 NOTE — Progress Notes (Signed)
Subjective:     Patient ID: Judy [name], female   DOB: 1960/04/25,  MRN: [id number]    Brief patient profile:  55 yowf quit smoking 2007  @  150 lb  with cough that resolved and no resp problems until winter 2015 with doe x walking the dog s much in terms of cough then 10/04/13 sudden sense she couldn't get a breath and coughed up blood x one tsp plus slt green mucus  > better with saba and started on inhalers/ abx  >  better and pred taper and dx of GOLD II copd was made 11/2013 and non ischemic cardiomyopathy 05/14/14.   History of Present Illness  10/08/2013 1st [facility] office visit/ Davyon Fisch Chief Complaint  Patient presents with  . Pulmonary Consult    Referred by Penni Homans Cox-sob with exertion x 6-8 mths.,worse since 4 days ago,occass. cough-tsp. of blood 4 days ago,usually unprod.,no wheezing,midchest tightness,no fcs,Had neb. since yesterday(used 2x yesterday)   baseline = 132ft   to mailbox and sob when gets there.  rec Clonidine 0.1 mg twice daily until you return  Plan A = automatic = symbiocort 160 Take 2 puffs first thing in am and then another 2 puffs about 12 hours later.  Plan B = Only use your albuterol (proair)as a rescue medication    Plan C = nebulizer albuterol, ok to use up to every 4 hours if can't get relief from Plan B GERD diet     LHC 05/14/14  1. No significant coronary artery disease 2. Moderate to severely reduced LV systolic function due to nonischemic cardiomyopathy.  3. Moderately elevated left ventricular end-diastolic pressure.      10/09/2014 f/u ov/Brisha Mccabe re: cough/ leg swelling / GOLD II copd no maint rx/ on coreg 50 mg daily   Chief Complaint  Patient presents with  . Follow-up    PFT done today. Pt states her breathing is unchanged. She c/o hoarsenss and cough for the past 2 days- prod with min green sputum.    Better off acei/ leg swelling asymmetric L > R since off ace better with diuresis  Walk acoss driveway/ parking lot ok / no  regular exercise   Has saba but not using - denies any worse off all maint rx  rec zpak  GERD diet  Please see patient coordinator before you leave today  to schedule venous dopplers > neg bilaterally     01/12/2015  f/u ov/Sutton Hirsch re: GOLD II copd/ on no inhalers at all  / dry cough has recurred on entresto  Chief Complaint  Patient presents with  . Follow-up    pt following for COPD: pt c/o SOB. pt states she doesnt think anything has improved. pt c/ o lots of swelling in the legs. pt c/o dry cough on exertion which can be as little as 3 feet.    Breathing and cough were better now worse again on entresto Wear 02 at 2lpm at bedtime for now - when they do your cpap titration you may or may not still need supplemental 02 but they will tell you if you do  Keep up with appts to do cpap titration and follow up with Dr Mayford Knife - your cough with walking is probably due to [facility] as you have a similar problem previously on ACE inhibitor    07/14/2015  f/u ov/Rowin Bayron re:  Obesity/ osa/ chf   GOLD II copd but no inhalers  On cpap and noct 02  Chief Complaint  Patient  presents with  . Acute Visit    Pt c/o increased SOB for the past 3-4 months.   cough better still on entresto,  sob worse as wt trending up/ using cpap and 2lpm hs  And wants to travel but has not been able to work out details with DME company   Doe off 02 = MMRC2 = can't walk a nl pace on a flat grade s sob but does fine slow and flat eg shopping    No obvious day to day or daytime variability or assoc excess or purulent sputum  cp or chest tightness, subjective wheeze or overt sinus or hb symptoms. No unusual exp hx or h/o childhood pna/ asthma or knowledge of premature birth.  Sleeping ok without nocturnal  or early am exacerbation  of respiratory  c/o's or need for noct saba. Also denies any obvious fluctuation of symptoms with weather or environmental changes or other aggravating or alleviating factors except as outlined above    Current Medications, Allergies, Complete Past Medical History, Past Surgical History, Family History, and Social History were reviewed in [employer] record.  ROS  The following are not active complaints unless bolded sore throat, dysphagia, dental problems, itching, sneezing,  nasal congestion or excess/ purulent secretions, ear ache,   fever, chills, sweats, unintended wt loss, classically pleuritic or exertional cp, hemoptysis,  orthopnea pnd or leg swelling, presyncope, palpitations, abdominal pain, anorexia, nausea, vomiting, diarrhea  or change in bowel or bladder habits, change in stools or urine, dysuria,hematuria,  rash, arthralgias, visual complaints, headache, numbness, weakness or ataxia or problems with walking or coordination,  change in mood/affect or memory.               Objective:   Physical Exam  amb obese  wf nad   11/27/2013     237 >   01/08/2014  236 > 01/21/2014 241 >235 02/21/2014 > 02/26/2014  232 > 03/27/2014  236 >236 04/10/2014 > 05/22/2014   237 >  08/21/2014 245 > 10/09/2014 257 >  01/12/2015   268 > 07/14/2015  278   Vital signs reviewed   HEENT: nl dentition, turbinates, and orophanx. Nl external ear canals without cough reflex   NECK :  without JVD/Nodes/TM/ nl carotid upstrokes bilaterally   LUNGS: no acc muscle use, clear to A and P bilaterally without cough on insp or exp maneuvers   CV:  RRR  no s3 or murmur or increase in P2,   L >> R LE pitting edema   ABD:  soft and nontender with nl excursion in the supine position. No bruits or organomegaly, bowel sounds nl  MS:  warm without deformities, calf tenderness, cyanosis or clubbing  SKIN: warm and dry without lesions        Assessment:

## 2015-07-14 NOTE — Patient Instructions (Signed)
Try spiriva respimat 2 pffs each am and if breathing better, fill it.  No need for ambulatory 02 at this point  Please see patient coordinator before you leave today  to schedule travel 02 /cpap issues   Please schedule a follow up visit in 6  months but call sooner if needed

## 2015-07-18 NOTE — Assessment & Plan Note (Signed)
-   11/27/2013  PFTs   FEV1  1.51 (54%) ratio 52 and and no better p B2 and DLCO  71 - 01/08/2014  p extensive coaching HFA effectiveness =    90% > rec resume symbicort 160 2bid  - 03/27/14 trial of dulera 200/tudorza samples only to assure adherence  04/10/2014   Alpha 1 MM , nml level  - trial off spiriva 05/22/14 / off all resp rx 08/22/14 - PFT's  10/09/2014  FEV1 1.64 (59 % ) ratio 54  p 11 % improvement from saba with DLCO  69 % corrects to 73  % for alv volume   - 07/14/2015  After extensive coaching HFA effectiveness =    75% > try spiriva 2 puffs each am   She does have a GROUP B pattern copd per guidelines is a candidate for laba or lama or lama/laba > rec trial of spiriva respimat with low threshold to change to stiolto if does not notice improvement .  I had an extended discussion with the patient reviewing all relevant studies completed to date and  lasting 15 to 20 minutes of a 25 minute visit    Each maintenance medication was reviewed in detail including most importantly the difference between maintenance and prns and under what circumstances the prns are to be triggered using an action plan format that is not reflected in the computer generated alphabetically organized AVS.    Please see instructions for details which were reviewed in writing and the patient given a copy highlighting the part that I personally wrote and discussed at today's ov.

## 2015-07-18 NOTE — Assessment & Plan Note (Signed)
Body mass index is 50.83   Lab Results  Component Value Date   TSH 1.68 10/08/2013     Contributing to gerd tendency/ doe/reviewed the need and the process to achieve and maintain neg calorie balance > defer f/u primary care including intermittently monitoring thyroid status

## 2015-07-18 NOTE — Assessment & Plan Note (Signed)
11/20/14 PSS: moderate with AHI 26/hr with oxygen desaturations as low as 70%> cpap titration  02/02/16  Trial of CPAP therapy on 16 cm H2O with a Small size Resmed Full Face Mask AirFit F10 mask and heated humidification. - Avoid alcohol, sedatives and other CNS depressants that may worsen sleep apnea and disrupt normal sleep architecture. - Sleep hygiene should be reviewed to assess factors that may improve sleep quality. - Weight management and regular exercise should be initiated or continued. - [facility] to [facility] for [facility]-evaluation after 10 weeks of therapy> /fu Dr Mayford Knife    It appears to me from last cpap titration that need for 02 was eliminated p titration > will ask Dr Mayford Knife to address this as she may no longer need 02 which would help her make travel plans

## 2015-07-18 NOTE — Assessment & Plan Note (Addendum)
Walk with desats 88% 04/10/2014 >begin O2 w/ act 2 l/m  -07/14/2015  Walked RA x 3 laps @ 185 ft each stopped due to end of study, nl pace, no significant desat or sob. > hs 02 only   Adequate control on present rx, reviewed > no change in rx needed  = no 02 with ambulation, just at hs with cpap - see osa a/p

## 2015-08-14 ENCOUNTER — Telehealth: Payer: Self-pay | Admitting: Cardiology

## 2015-08-14 NOTE — Telephone Encounter (Signed)
New Message:    Pt says she have gained 4lbs in the last 2 days. She said Dr Mayford Knife told her to call if this happen.

## 2015-08-14 NOTE — Telephone Encounter (Signed)
Patient called to report she gained 4 pounds in 2 days.  She complains of mild swelling in her feet and lower legs.  She reports she is chronically SOB, and she does not notice any more SOB than usual. She is not audibly SOB on the phone. She has no other symptoms. Her BP is 125/66 and HR 68 this AM.  Confirmed with patient she is limiting her salt. Instructed patient to take one extra dose of Lasix (20 mg) today - last creatinine 0.68. Instructed patient to be extra strict with her salt intake over the weekend and to elevate her legs while sitting. She understands she will be called on Monday for follow-up.  To Dr. Mayford Knife for further recommendations.

## 2015-08-19 NOTE — Telephone Encounter (Signed)
Please check to see if patients swelling resolved

## 2015-08-19 NOTE — Telephone Encounter (Signed)
Patient states her swelling has resolved and her weight has normalized. She st she "feels much better." She was appreciative of follow-up and understands to call if swelling worsens again.

## 2015-09-03 ENCOUNTER — Encounter: Payer: Self-pay | Admitting: Cardiology

## 2015-09-26 ENCOUNTER — Other Ambulatory Visit: Payer: Self-pay | Admitting: Cardiology

## 2015-10-15 ENCOUNTER — Ambulatory Visit: Payer: BLUE CROSS/BLUE SHIELD | Admitting: Cardiology

## 2015-10-23 ENCOUNTER — Other Ambulatory Visit: Payer: BLUE CROSS/BLUE SHIELD | Admitting: *Deleted

## 2015-10-23 ENCOUNTER — Telehealth: Payer: Self-pay | Admitting: Cardiology

## 2015-10-23 ENCOUNTER — Ambulatory Visit (INDEPENDENT_AMBULATORY_CARE_PROVIDER_SITE_OTHER): Payer: BLUE CROSS/BLUE SHIELD | Admitting: Cardiology

## 2015-10-23 VITALS — BP 108/72 | HR 74 | Ht 65.0 in | Wt 290.8 lb

## 2015-10-23 DIAGNOSIS — R0602 Shortness of breath: Secondary | ICD-10-CM

## 2015-10-23 LAB — BASIC METABOLIC PANEL
BUN: 12 mg/dL (ref 7–25)
CHLORIDE: 103 mmol/L (ref 98–110)
CO2: 30 mmol/L (ref 20–31)
Calcium: 9 mg/dL (ref 8.6–10.4)
Creat: 0.69 mg/dL (ref 0.50–1.05)
Glucose, Bld: 161 mg/dL — ABNORMAL HIGH (ref 65–99)
POTASSIUM: 4.1 mmol/L (ref 3.5–5.3)
SODIUM: 144 mmol/L (ref 135–146)

## 2015-10-23 NOTE — Progress Notes (Signed)
Patient comes in this afternoon for EKG and lab work, per Dr. Mayford Knife, after calling in c/o SOB (see telephone note from today). EKG showing NSR, HR 74.  Reviewed and signed by Dr. Mayford Knife. Patient advised we will call her with any critical lab result and to continue to monitor her symptoms.

## 2015-10-23 NOTE — Telephone Encounter (Signed)
New Message:    Pt is having difficult breathing.

## 2015-10-23 NOTE — Telephone Encounter (Signed)
Call sent directly to triage. I spoke with the patient. She reports that she is having increased SOB over the last 3-5 days.  She cannot walk 10 feet without being completely out of breath.  She denies chest pain. She has a history of COPD- on O2 at night.  She reports that when she gets very SOB, she has to sit for a little while before she can get her pulse ox- when she checks her O2 sat, she is 91%. She reports her HR is 110. She denies feeling her heart skip/ race. She is weighing at home and her weight is stable. Meds confirmed.  Last echo was 08/2014- EF 35%. Will review with Dr. Mayford Knife and call the patient back.

## 2015-10-23 NOTE — Patient Instructions (Addendum)
Continue current treatment plan.  Keep scheduled appointment later this month with Dr. Mayford Knife.  The office will call you with any critical lab results.

## 2015-10-23 NOTE — Telephone Encounter (Signed)
Reviewed with Dr. Mayford Knife. Orders received for the patient to come in for an EKG/ BMP/ BNP today. I have notified the patient and she is agreeable. She will come in around 2:30 pm today.

## 2015-10-24 LAB — BRAIN NATRIURETIC PEPTIDE: BRAIN NATRIURETIC PEPTIDE: 24.2 pg/mL (ref <100)

## 2015-10-30 ENCOUNTER — Encounter: Payer: Self-pay | Admitting: Cardiology

## 2015-11-11 ENCOUNTER — Encounter: Payer: Self-pay | Admitting: Cardiology

## 2015-11-12 ENCOUNTER — Encounter: Payer: Self-pay | Admitting: Cardiology

## 2015-11-12 ENCOUNTER — Ambulatory Visit (INDEPENDENT_AMBULATORY_CARE_PROVIDER_SITE_OTHER): Payer: BLUE CROSS/BLUE SHIELD | Admitting: Cardiology

## 2015-11-12 VITALS — BP 104/80 | HR 73 | Ht 65.0 in | Wt 287.4 lb

## 2015-11-12 DIAGNOSIS — I5022 Chronic systolic (congestive) heart failure: Secondary | ICD-10-CM | POA: Diagnosis not present

## 2015-11-12 DIAGNOSIS — I10 Essential (primary) hypertension: Secondary | ICD-10-CM | POA: Diagnosis not present

## 2015-11-12 DIAGNOSIS — G4733 Obstructive sleep apnea (adult) (pediatric): Secondary | ICD-10-CM | POA: Diagnosis not present

## 2015-11-12 DIAGNOSIS — I420 Dilated cardiomyopathy: Secondary | ICD-10-CM | POA: Diagnosis not present

## 2015-11-12 MED ORDER — FUROSEMIDE 40 MG PO TABS: 40.0000 mg | ORAL_TABLET | Freq: Every day | ORAL | 11 refills | Status: DC

## 2015-11-12 NOTE — Patient Instructions (Addendum)
Medication Instructions:  1) INCREASE LASIX to 40 mg daily  Labwork: None  Testing/Procedures: None  Follow-Up: You have an appointment scheduled with Dr. Norris Cross assistant, Robbie Lis, on 12/09/2015 at 10:30AM.   Your physician wants you to follow-up in: 6 months with Dr. Mayford Knife. You will receive a reminder letter in the mail two months in advance. If you don't receive a letter, please call our office to schedule the follow-up appointment.   Any Other Special Instructions Will Be Listed Below (If Applicable).     If you need a refill on your cardiac medications before your next appointment, please call your pharmacy.

## 2015-11-12 NOTE — Progress Notes (Signed)
Cardiology Office Note    Date:  11/12/2015   ID:  Judy [name], DOB November 01, 1960, MRN [id number]  PCP:  Blane Ohara, MD  Cardiologist:  Armanda Magic, MD   No chief complaint on file.   History of Present Illness:  Judy [name] is a 55 y.o. female who presents for followup of nonischemic DCM with normal coronary arteries by cath with EF 45%.She underwent Cardiac MRI after 3 months of medical therapy which showed persistence of DCM and EF 28%.  Serum Ferritin and Serum protein electrophoresis were normal.  She was seen by EP who recommended AICD for primary prevention of SCD but she chose not to proceed. She is followed by Dr. Sherene Sires for COPD. She is on home O2 at night.She presents back for followup. She has moderate OSA with an AHI of 26/hr with oxygen desaturations as low as 70%.  She underwent CPAP titration to 16cm H2O.  She is tolerating her CPAP well.  She tolerates the full face mask and feels the pressure is adequate.  Since going on CPAP she has more energy and less daytime sleepiness.  She feels rested in the am when she gets up.  She denies any palpitations or syncope. She has LE edema that has improved. She has chronic DOE which is worse over the past few months.  She is fairly sedentary at home.  She denies any chest pain or pressure.  She has some very mild LE edema but it is much improved from what it had been in the past.     Past Medical History:  Diagnosis Date  . Chronic systolic CHF (congestive heart failure), NYHA class 3 (HCC) 10/07/2014   She has refused AICD placement in the past  . COPD (chronic obstructive pulmonary disease) (HCC)   . DCM (dilated cardiomyopathy) (HCC) 04/25/2014   EF 28% by MRI despite maximum medical therapy  . Former tobacco use   . Hyperlipidemia   . Hypertension   . Obesity   . OSA (obstructive sleep apnea)    moderate with AHI 26/hr with oxygen desaturations as low as 70%  . Scoliosis     Past Surgical History:  Procedure  Laterality Date  . ABDOMINAL HYSTERECTOMY    . ankle artery involved cyst removal    . CARDIAC CATHETERIZATION  05/14/2014   normal coronary arteries  . CARPAL TUNNEL RELEASE    . FASCIOTOMY     1993 for platar fasciitis  . harrington rod scoliosis     1981  . LEFT HEART CATHETERIZATION WITH CORONARY ANGIOGRAM N/A 05/14/2014   Procedure: LEFT HEART CATHETERIZATION WITH CORONARY ANGIOGRAM;  Surgeon: Iran Ouch, MD;  Location: [facility];  Service: Cardiovascular;  Laterality: N/A;  . ROTATOR CUFF REPAIR    . UMBILICAL HERNIA REPAIR    . VESICOVAGINAL FISTULA CLOSURE W/ TAH      Current Medications: Outpatient Medications Prior to Visit  Medication Sig Dispense Refill  . aspirin 81 MG tablet Take 81 mg by mouth at bedtime.     Marland Kitchen atorvastatin (LIPITOR) 20 MG tablet TAKE 1 TABLET BY MOUTH EVERY DAY AT 6 PM 90 tablet 2  . B Complex-C (SUPER B COMPLEX PO) Take 1 tablet by mouth daily.     . calcium citrate-vitamin D (CITRACAL+D) 315-200 MG-UNIT per tablet Take 1 tablet by mouth daily.    . carvedilol (COREG) 25 MG tablet TAKE 1 TABLET (25 MG TOTAL) BY MOUTH 2 (TWO) TIMES DAILY. 180 tablet 0  .  Cholecalciferol (VITAMIN D-3) 1000 UNITS CAPS Take 1 capsule by mouth daily.    . furosemide (LASIX) 20 MG tablet Take 1 tablet (20 mg total) by mouth daily. 30 tablet 11  . gabapentin (NEURONTIN) 400 MG capsule Take 2 tablets by mouth in the AM and 2 by mouth in the PM  1  . Lutein-Zeaxanthin 25-5 MG CAPS Take 1 capsule by mouth at bedtime.     . Multiple Vitamin (MULTIVITAMIN WITH MINERALS) TABS tablet Take 1 tablet by mouth daily.    . sacubitril-valsartan (ENTRESTO) 24-26 MG Take 1 tablet by mouth 2 (two) times daily. 60 tablet 11  . spironolactone (ALDACTONE) 25 MG tablet TAKE 1 TABLET (25 MG TOTAL) BY MOUTH DAILY. 90 tablet 3  . UNABLE TO FIND CPAP with o2 2lpm  DME- [facility]    . Lactobacillus (ACIDOPHILUS) CAPS capsule Take 1 capsule by mouth daily.    . potassium chloride SA  (K-DUR,KLOR-CON) 20 MEQ tablet Take 1 tablet (20 mEq total) by mouth daily. 90 tablet 3  . Tiotropium Bromide Monohydrate (SPIRIVA RESPIMAT) 2.5 MCG/ACT AERS 2 pffs each am 1 Inhaler 11  . venlafaxine XR (EFFEXOR-XR) 75 MG 24 hr capsule Take 75 mg by mouth daily with breakfast.     No facility-administered medications prior to visit.      Allergies:   Demerol [meperidine]   Social History   Social History  . Marital status: Married    Spouse name: N/A  . Number of children: N/A  . Years of education: N/A   Social History Main Topics  . Smoking status: Former Smoker    Packs/day: 1.00    Years: 15.00    Types: Cigarettes    Quit date: 02/14/2005  . Smokeless tobacco: Never Used  . Alcohol use Yes     Comment: occ  . Drug use: No  . Sexual activity: Not Asked   Other Topics Concern  . None   Social History Narrative   Lives in [place] with spouse and son.   Previously worked as a Comptrollerphysical therapist assistant              Family History:  The patient's family history includes Allergies in her father; Emphysema in her father; Heart disease (age of onset: 4855) in her father; Heart failure in her father.   ROS:   Please see the history of present illness.    ROS All other systems reviewed and are negative.  No flowsheet data found.     PHYSICAL EXAM:   VS:  BP 104/80   Pulse 73   Ht 5\' 5"  (1.651 m)   Wt 287 lb 6.4 oz (130.4 kg)   SpO2 95%   BMI 47.83 kg/m    GEN: Well nourished, well developed, in no acute distress  HEENT: normal  Neck: no JVD, carotid bruits, or masses Cardiac: RRR; no murmurs, rubs, or gallops,no edema.  Intact distal pulses bilaterally.  Respiratory:  clear to auscultation bilaterally, normal work of breathing GI: soft, nontender, nondistended, + BS MS: no deformity or atrophy  Skin: warm and dry, no rash Neuro:  Alert and Oriented x 3, Strength and sensation are intact Psych: euthymic mood, full affect  Wt Readings from Last 3  Encounters:  11/12/15 287 lb 6.4 oz (130.4 kg)  10/23/15 290 lb 12.8 oz (131.9 kg)  07/14/15 278 lb (126.1 kg)      Studies/Labs Reviewed:   EKG:  EKG is ordered today.  The ekg ordered today demonstrates  Recent Labs: 10/23/2015: Brain Natriuretic Peptide 24.2; BUN 12; Creat 0.69; Potassium 4.1; Sodium 144   Lipid Panel    Component Value Date/Time   CHOL 194 09/03/2014 0856   TRIG 145.0 09/03/2014 0856   HDL 54.40 09/03/2014 0856   CHOLHDL 4 09/03/2014 0856   VLDL 29.0 09/03/2014 0856   LDLCALC 111 (H) 09/03/2014 0856    Additional studies/ records that were reviewed today include:  none    ASSESSMENT:    1. Essential hypertension, benign   2. OSA (obstructive sleep apnea)   3. Chronic systolic CHF (congestive heart failure), NYHA class 2 (HCC)   4. DCM (dilated cardiomyopathy) (HCC)      PLAN:  In order of problems listed above: 1.  HTN - BP controlled on current meds. Continue BB /ACE and diuretics. 2.  OSA - the patient is tolerating PAP therapy well without any problems.   The patient has been using and benefiting from CPAP use and will continue to benefit from therapy.  I will get a download from the DME. 3.  Chronic systolic CHF - she appears euvolemic on exam today except for mild LE edema.  Continue BB/aldactone/Lasix/Entresto.  Softt BP prevents uptitration of Entresto. I will increase her Lasix to 40mg  daily since she has LE edema and hopefully her SOB may improve some. I have recommended that she try to get into some type of exercise program.   4.  DCM - EF 28% .  Cath with no CAD.  Ferritin and protein electrophoresis were normal.   5.  Chronic SOB multifactorial secondary to obesity and deconditioning, COPD and chronic systolic CHF.  Her recent BNP was normal.  Will try increasing Lasix to 40mg  daily due to LE edema.     Medication Adjustments/Labs and Tests Ordered: Current medicines are reviewed at length with the patient today.  Concerns regarding  medicines are outlined above.  Medication changes, Labs and Tests ordered today are listed in the Patient Instructions below.  There are no Patient Instructions on file for this visit.   Signed, Armanda Magic, MD  11/12/2015 11:24 AM    [facility] [address] [place], [place], [place]  [address] Phone: [phone]; Fax: [phone]

## 2015-11-27 NOTE — Progress Notes (Signed)
Informed patient of recent download results. Patient verbalized understanding.

## 2015-12-09 ENCOUNTER — Ambulatory Visit (INDEPENDENT_AMBULATORY_CARE_PROVIDER_SITE_OTHER): Payer: BLUE CROSS/BLUE SHIELD | Admitting: Cardiology

## 2015-12-09 ENCOUNTER — Other Ambulatory Visit: Payer: Self-pay

## 2015-12-09 ENCOUNTER — Encounter: Payer: Self-pay | Admitting: Cardiology

## 2015-12-09 VITALS — BP 100/68 | HR 69 | Ht 65.0 in | Wt 287.0 lb

## 2015-12-09 DIAGNOSIS — I5022 Chronic systolic (congestive) heart failure: Secondary | ICD-10-CM | POA: Diagnosis not present

## 2015-12-09 LAB — BASIC METABOLIC PANEL
BUN: 13 mg/dL (ref 7–25)
CALCIUM: 9.2 mg/dL (ref 8.6–10.4)
CHLORIDE: 102 mmol/L (ref 98–110)
CO2: 33 mmol/L — AB (ref 20–31)
CREATININE: 0.75 mg/dL (ref 0.50–1.05)
Glucose, Bld: 138 mg/dL — ABNORMAL HIGH (ref 65–99)
Potassium: 4.4 mmol/L (ref 3.5–5.3)
Sodium: 143 mmol/L (ref 135–146)

## 2015-12-09 NOTE — Progress Notes (Signed)
12/09/2015 Judy [name]   03-20-1960  [id number]  Primary Physician Blane Ohara, MD Primary Cardiologist: Dr. Mayford Knife    Reason for Visit/CC: f/u for LEE, dyspnea and chronic systolic HF  HPI:  55 y.o. female, followed by Dr. Mayford Knife, who presents for 4 week f/u.  She has a h/o of nonischemic DCM with normal coronary arteries by cath with EF 45%.She underwent Cardiac MRI after 3 months of medical therapy which showed persistence of DCM and EF 28%. Serum Ferritin and Serum protein electrophoresis were normal. She was seen by EP who recommended AICD for primary prevention of SCD but she chose not to proceed. She is followed by Dr. Sherene Sires for COPD. She is on home O2 at night.She also has OSA, compliant with CPAP therapy and is followed by Dr.Turner.   She recently saw Dr. Mayford Knife 11/12/15 for routine f/u. She noted slightly increased dyspnea and LE edema. Her BP was soft, preventing upward titration of her Entresto. Dr. Mayford Knife elected to increase her lasix to 40 mg daily, in an effort to improve both her dyspnea and LE edema. She was instructed to return to clinic for 4 week f/u.  She is here today for reassessment. She has done well. She notes improvement. Lower extremity edema has resolved. Breathing has improved she does admit that she had some mild increased dyspnea following her appointment and had congestion. She was started on antibiotic by another provider. Her last dose was last Friday. Breathing is back to normal. She reports good urine output with Lasix. Blood pressure stable 100/68. She admits that she has not been fully compliant with daily weights. She also admits to some dietary indiscretion with sodium, however she appears euvolemic on exam today. She has been fully compliant with all of her medications and fully compliant with CPAP therapy nightly.  Current Meds  Medication Sig  . aspirin 81 MG tablet Take 81 mg by mouth at bedtime.   Marland Kitchen atorvastatin (LIPITOR) 20 MG tablet TAKE 1  TABLET BY MOUTH EVERY DAY AT 6 PM  . B Complex-C (SUPER B COMPLEX PO) Take 1 tablet by mouth daily.   . calcium citrate-vitamin D (CITRACAL+D) 315-200 MG-UNIT per tablet Take 1 tablet by mouth daily.  . carvedilol (COREG) 25 MG tablet TAKE 1 TABLET (25 MG TOTAL) BY MOUTH 2 (TWO) TIMES DAILY.  Marland Kitchen Cholecalciferol (VITAMIN D-3) 1000 UNITS CAPS Take 1 capsule by mouth daily.  . furosemide (LASIX) 40 MG tablet Take 1 tablet (40 mg total) by mouth daily.  Marland Kitchen gabapentin (NEURONTIN) 400 MG capsule Take 2 tablets by mouth in the AM and 2 by mouth in the PM  . levothyroxine (SYNTHROID, LEVOTHROID) 50 MCG tablet Take 50 mcg by mouth daily.  . Lutein-Zeaxanthin 25-5 MG CAPS Take 1 capsule by mouth at bedtime.   . Multiple Vitamin (MULTIVITAMIN WITH MINERALS) TABS tablet Take 1 tablet by mouth daily.  . sacubitril-valsartan (ENTRESTO) 24-26 MG Take 1 tablet by mouth 2 (two) times daily.  Marland Kitchen spironolactone (ALDACTONE) 25 MG tablet TAKE 1 TABLET (25 MG TOTAL) BY MOUTH DAILY.  Marland Kitchen tiZANidine (ZANAFLEX) 2 MG tablet Take 2 mg by mouth daily.  Marland Kitchen UNABLE TO FIND CPAP with o2 2lpm  DME- [facility]   Allergies  Allergen Reactions  . Demerol [Meperidine] Nausea And Vomiting   Past Medical History:  Diagnosis Date  . Chronic systolic CHF (congestive heart failure), NYHA class 3 (HCC) 10/07/2014  . COPD (chronic obstructive pulmonary disease) (HCC)   . DCM (dilated cardiomyopathy) (HCC)  04/25/2014   EF 28% by MRI despite maximum medical therapy  . Former tobacco use   . Hyperlipidemia   . Hypertension   . Obesity   . OSA (obstructive sleep apnea)    moderate with AHI 26/hr with oxygen desaturations as low as 70%  . Scoliosis    Family History  Problem Relation Age of Onset  . Emphysema Father   . Allergies Father   . Heart failure Father   . Heart disease Father 61    MI   Past Surgical History:  Procedure Laterality Date  . ABDOMINAL HYSTERECTOMY    . ankle artery involved cyst removal    . CARDIAC  CATHETERIZATION  05/14/2014   normal coronary arteries  . CARPAL TUNNEL RELEASE    . FASCIOTOMY     1993 for platar fasciitis  . harrington rod scoliosis     1981  . LEFT HEART CATHETERIZATION WITH CORONARY ANGIOGRAM N/A 05/14/2014   Procedure: LEFT HEART CATHETERIZATION WITH CORONARY ANGIOGRAM;  Surgeon: Iran Ouch, MD;  Location: [facility];  Service: Cardiovascular;  Laterality: N/A;  . ROTATOR CUFF REPAIR    . UMBILICAL HERNIA REPAIR    . VESICOVAGINAL FISTULA CLOSURE W/ TAH     Social History   Social History  . Marital status: Married    Spouse name: N/A  . Number of children: N/A  . Years of education: N/A   Occupational History  . Not on file.   Social History Main Topics  . Smoking status: Former Smoker    Packs/day: 1.00    Years: 15.00    Types: Cigarettes    Quit date: 02/14/2005  . Smokeless tobacco: Never Used  . Alcohol use Yes     Comment: occ  . Drug use: No  . Sexual activity: Not on file   Other Topics Concern  . Not on file   Social History Narrative   Lives in [place] with spouse and son.   Previously worked as a Comptroller              Review of Systems: General: negative for chills, fever, night sweats or weight changes.  Cardiovascular: negative for chest pain, dyspnea on exertion, edema, orthopnea, palpitations, paroxysmal nocturnal dyspnea or shortness of breath Dermatological: negative for rash Respiratory: negative for cough or wheezing Urologic: negative for hematuria Abdominal: negative for nausea, vomiting, diarrhea, bright red blood per rectum, melena, or hematemesis Neurologic: negative for visual changes, syncope, or dizziness All other systems reviewed and are otherwise negative except as noted above.   Physical Exam:  Blood pressure 100/68, pulse 69, height 5\' 5"  (1.651 m), weight 287 lb (130.2 kg).  General appearance: alert, cooperative and no distress Neck: no carotid bruit, no JVD and thyroid  not enlarged, symmetric, no tenderness/mass/nodules Lungs: clear to auscultation bilaterally Heart: regular rate and rhythm, S1, S2 normal, no murmur, click, rub or gallop Extremities: extremities normal, atraumatic, no cyanosis or edema Pulses: 2+ and symmetric  EKG not performed   ASSESSMENT AND PLAN:   1. Chronic Systolic HF: euvolemic on physical exam. LEE resolved. Breathing improved. Continue 40 mg of lasix daily. We will recheck a BMP today to assess renal function and K. If stable, continue on higher dose of lasix. We also discussed importance of daily weights, low sodium diet and sliding scale lasix adjustment based on weight gain. Continue BB, Entresto and Aldactone.   2. NICM/DCM: EF 28%. Cath with no CAD.  Ferritin and protein  electrophoresis were normal.  She was seen by EP who recommended AICD for primary prevention of SCD but she chose not to proceed. Continue on BB, Entresto and Aldactone.   3. COPD: followed by Dr. Sherene SiresWert.   4. HTN: BP is controlled and stable.   5. OSA: compliant with CPAP nightly.   PLAN  Keep f/u with Dr. Mayford Knifeurner planned in 6 months.   Robbie LisBrittainy Preslee Regas PA-C 12/09/2015 10:26 AM

## 2015-12-09 NOTE — Patient Instructions (Signed)
Medication Instructions:  Your physician recommends that you continue on your current medications as directed. Please refer to the Current Medication list given to you today.   Labwork: Bmet Today   Testing/Procedures: None ordered  Follow-Up: Your physician wants you to follow-up in: March 2018 with Dr.Turner You will receive a reminder letter in the mail two months in advance. If you don't receive a letter, please call our office to schedule the follow-up appointment.   Any Other Special Instructions Will Be Listed Below (If Applicable). Your physician recommends that you weigh, daily, at the same time every day, and in the same amount of clothing. Please record your daily weights. Call the office if your weigh is up 3lbs in a day 5lbs in a week.  Limit sodium in your diet to no more than 2 grams (2,000mg  ) daily      If you need a refill on your cardiac medications before your next appointment, please call your pharmacy.

## 2015-12-16 ENCOUNTER — Telehealth: Payer: Self-pay | Admitting: Cardiology

## 2015-12-16 NOTE — Telephone Encounter (Signed)
Pt aware of lab results with verbal understanding. 

## 2015-12-16 NOTE — Telephone Encounter (Signed)
New Message ° °Pt returning RN call. Please call back to discuss  °

## 2015-12-16 NOTE — Telephone Encounter (Signed)
-----   Message from [place], [place] sent at 12/15/2015  3:44 PM EDT ----- Kidney function and potassium levels are normal. Continue meds as directed.

## 2015-12-23 ENCOUNTER — Other Ambulatory Visit: Payer: Self-pay | Admitting: Cardiology

## 2015-12-27 ENCOUNTER — Other Ambulatory Visit: Payer: Self-pay | Admitting: Cardiology

## 2016-01-14 ENCOUNTER — Ambulatory Visit (INDEPENDENT_AMBULATORY_CARE_PROVIDER_SITE_OTHER): Payer: BLUE CROSS/BLUE SHIELD | Admitting: Internal Medicine

## 2016-01-14 ENCOUNTER — Encounter: Payer: Self-pay | Admitting: Internal Medicine

## 2016-01-14 VITALS — BP 118/80 | HR 76 | Ht 65.0 in | Wt 292.2 lb

## 2016-01-14 DIAGNOSIS — J9611 Chronic respiratory failure with hypoxia: Secondary | ICD-10-CM | POA: Diagnosis not present

## 2016-01-14 DIAGNOSIS — J449 Chronic obstructive pulmonary disease, unspecified: Secondary | ICD-10-CM

## 2016-01-14 DIAGNOSIS — G4733 Obstructive sleep apnea (adult) (pediatric): Secondary | ICD-10-CM

## 2016-01-14 NOTE — Assessment & Plan Note (Signed)
Walk with desats 88% 04/10/2014 >begin O2 w/ act 2 l/m  -07/14/2015  Walked RA x 3 laps @ 185 ft each stopped due to end of study, nl pace, no significant desat or sob. > hs 02 only   rec she monitor her sats walking with goal of > 90% to help burn fat  Will repeat ono  On 2lpm /cpap to be sure 02 still adequate given wt gain

## 2016-01-14 NOTE — Assessment & Plan Note (Signed)
11/20/14 PSS: moderate with AHI 26/hr with oxygen desaturations as low as 70%> cpap titration  02/02/16  Trial of CPAP therapy on 16 cm H2O with a Small size Resmed Full Face Mask AirFit F10 mask and heated humidification. - Avoid alcohol, sedatives and other CNS depressants that may worsen sleep apnea and disrupt normal sleep architecture. - Sleep hygiene should be reviewed to assess factors that may improve sleep quality. - Weight management and regular exercise should be initiated or continued.    Advised all questions re adequacy of cpap/ equipment per Dr Mayford Knife  F/u with pulmonary is prn

## 2016-01-14 NOTE — Assessment & Plan Note (Signed)
-   11/27/2013  PFTs   FEV1  1.51 (54%) ratio 52 and and no better p B2 and DLCO  71 - 01/08/2014  p extensive coaching HFA effectiveness =    90% > rec resume symbicort 160 2bid  - 03/27/14 trial of dulera 200/tudorza samples only to assure adherence  04/10/2014   Alpha 1 MM , nml level  - trial off spiriva 05/22/14 / off all resp rx 08/22/14 - PFT's  10/09/2014  FEV1 1.64 (59 % ) ratio 54  p 11 % improvement from saba with DLCO  69 % corrects to 73  % for alv volume   - 07/14/2015  A  > try spiriva 2 puffs each am > no better so pt d/c'd   As I explained to this patient in detail:  although there may be copd present, it may not be clinically relevant:   it does not appear to be really a factor in  limiting activity tolerance I don't recommend aggressive pulmonary rx at this point unless  Symptoms worsen or acute exacerbations become as issue, neither of which is the case now.  I asked the patient to contact this office at any time in the future should either of these problems arise.    Pulmonary f/u can be prn

## 2016-01-14 NOTE — Progress Notes (Signed)
Subjective:     Patient ID: Judy [name], female   DOB: 02-07-1961,  MRN: [id number]    Brief patient profile:  55 yowf quit smoking 2007  @  150 lb  with cough that resolved and no resp problems until winter 2015 with doe x walking the dog s much in terms of cough then 10/04/13 sudden sense she couldn't get a breath and coughed up blood x one tsp plus slt green mucus  > better with saba and started on inhalers/ abx  >  better and pred taper and dx of GOLD II copd was made 11/2013 and non ischemic cardiomyopathy 05/14/14.   History of Present Illness  10/08/2013 1st [facility] office visit/ Kourtlynn Trevor Chief Complaint  Patient presents with  . Pulmonary Consult    Referred by Penni Homans Cox-sob with exertion x 6-8 mths.,worse since 4 days ago,occass. cough-tsp. of blood 4 days ago,usually unprod.,no wheezing,midchest tightness,no fcs,Had neb. since yesterday(used 2x yesterday)   baseline = 135ft   to mailbox and sob when gets there.  rec Clonidine 0.1 mg twice daily until you return  Plan A = automatic = symbiocort 160 Take 2 puffs first thing in am and then another 2 puffs about 12 hours later.  Plan B = Only use your albuterol (proair)as a rescue medication    Plan C = nebulizer albuterol, ok to use up to every 4 hours if can't get relief from Plan B GERD diet     LHC 05/14/14  1. No significant coronary artery disease 2. Moderate to severely reduced LV systolic function due to nonischemic cardiomyopathy.  3. Moderately elevated left ventricular end-diastolic pressure.      10/09/2014 f/u ov/Joeanne Robicheaux re: cough/ leg swelling / GOLD II copd no maint rx/ on coreg 50 mg daily   Chief Complaint  Patient presents with  . Follow-up    PFT done today. Pt states her breathing is unchanged. She c/o hoarsenss and cough for the past 2 days- prod with min green sputum.    Better off acei/ leg swelling asymmetric L > R since off ace better with diuresis  Walk acoss driveway/ parking lot ok / no  regular exercise   Has saba but not using - denies any worse off all maint rx  rec zpak  GERD diet  Please see patient coordinator before you leave today  to schedule venous dopplers > neg bilaterally     01/12/2015  f/u ov/Tarek Cravens re: GOLD II copd/ on no inhalers at all  / dry cough has recurred on entresto  Chief Complaint  Patient presents with  . Follow-up    pt following for COPD: pt c/o SOB. pt states she doesnt think anything has improved. pt c/ o lots of swelling in the legs. pt c/o dry cough on exertion which can be as little as 3 feet.    Breathing and cough were better now worse again on entresto Wear 02 at 2lpm at bedtime for now - when they do your cpap titration you may or may not still need supplemental 02 but they will tell you if you do  Keep up with appts to do cpap titration and follow up with Dr Mayford Knife - your cough with walking is probably due to [facility] as you have a similar problem previously on ACE inhibitor    07/14/2015  f/u ov/Alexandre Faries re:  Obesity/ osa/ chf   GOLD II copd but no inhalers  On cpap and noct 02  Chief Complaint  Patient  presents with  . Acute Visit    Pt c/o increased SOB for the past 3-4 months.   cough better still on entresto,  sob worse as wt trending up/ using cpap and 2lpm hs  And wants to travel but has not been able to work out details with DME company  Doe off 02 = MMR[facility]2 = can't walk a nl pace on a flat grade s sob but does fine slow and flat eg shopping Try spiriva respimat 2 pffs each am > not better so never filled rx  No need for ambulatory 02 at this point Please see patient coordinator before you leave today  to schedule travel 02 /cpap issues      01/14/2016  f/u ov/Carin Shipp re:  Obesity/ osa/chf  GOLDO II COPD / no better with spiriva  Chief Complaint  Patient presents with  . Follow-up    6 month follow up. Breathing has been the same since the last visit.   sleeping ok cpap/02 2lpm wakes up ok / not using 02 daytime  Admits uses  scooter most of the time now to shop as more sob as has gained wt     No obvious day to day or daytime variability or assoc excess or purulent sputum  cp or chest tightness, subjective wheeze or overt sinus or hb symptoms. No unusual exp hx or h/o childhood pna/ asthma or knowledge of premature birth.  Sleeping ok on cpap/ 2lpm without nocturnal  or early am exacerbation  of respiratory  c/o's or need for noct saba. Also denies any obvious fluctuation of symptoms with weather or environmental changes or other aggravating or alleviating factors except as outlined above   Current Medications, Allergies, Complete Past Medical History, Past Surgical History, Family History, and Social History were reviewed in [facility] electronic medical record.  ROS  The following are not active complaints unless bolded sore throat, dysphagia, dental problems, itching, sneezing,  nasal congestion or excess/ purulent secretions, ear ache,   fever, chills, sweats, unintended wt loss, classically pleuritic or exertional cp, hemoptysis,  orthopnea pnd or leg swelling, presyncope, palpitations, abdominal pain, anorexia, nausea, vomiting, diarrhea  or change in bowel or bladder habits, change in stools or urine, dysuria,hematuria,  rash, arthralgias, visual complaints, headache, numbness, weakness or ataxia or problems with walking or coordination,  change in mood/affect or memory.               Objective:   Physical Exam  amb obese  wf nad   11/27/2013     237 >   01/08/2014  236 > 01/21/2014 241 >235 02/21/2014 > 02/26/2014  232 > 03/27/2014  236 >236 04/10/2014 > 05/22/2014   237 >  08/21/2014 245 > 10/09/2014 257 >  01/12/2015   268 > 07/14/2015  278 > 01/14/2016  292    Vital signs reviewed  - Note on arrival 02 sats  97% on RA     HEENT: nl dentition, turbinates, and orophanx. Nl external ear canals without cough reflex   NECK :  without JVD/Nodes/TM/ nl carotid upstrokes bilaterally   LUNGS: no acc muscle use,  clear to A and P bilaterally without cough on insp or exp maneuvers   CV:  RRR  no s3 or murmur or increase in P2,  No significant lower ext  pitting edema   ABD:  soft and nontender with nl excursion in the supine position. No bruits or organomegaly, bowel sounds nl  MS:  warm without deformities,  calf tenderness, cyanosis or clubbing  SKIN: warm and dry without lesions        Assessment:

## 2016-01-14 NOTE — Assessment & Plan Note (Signed)
Body mass index is 48.62 trending further up  Lab Results  Component Value Date   TSH 1.68 10/08/2013     Contributing to gerd tendency/ doe/reviewed the need and the process to achieve and maintain neg calorie balance > defer f/u primary care including intermittently monitoring thyroid status

## 2016-01-14 NOTE — Patient Instructions (Signed)
Please see patient coordinator before you leave today  to schedule overnight pulse oximetry on your 02 and cpap as you usually use them   Pulmonary follow up is as needed

## 2016-02-23 ENCOUNTER — Other Ambulatory Visit: Payer: Self-pay

## 2016-02-23 NOTE — Telephone Encounter (Signed)
Prior auth for Entresto24-26 obtained from [facility].

## 2016-02-26 ENCOUNTER — Telehealth: Payer: Self-pay

## 2016-02-26 NOTE — Telephone Encounter (Signed)
Prior [place] for [employer] 24-26 obtained from [employer]. This is valid through 02/24/2018. Local pharmacy notified.

## 2016-02-29 ENCOUNTER — Other Ambulatory Visit: Payer: Self-pay | Admitting: *Deleted

## 2016-02-29 MED ORDER — FUROSEMIDE 40 MG PO TABS: 40.0000 mg | ORAL_TABLET | Freq: Every day | ORAL | 3 refills | Status: DC

## 2016-03-01 ENCOUNTER — Other Ambulatory Visit: Payer: Self-pay

## 2016-03-01 MED ORDER — ATORVASTATIN CALCIUM 20 MG PO TABS: ORAL_TABLET | ORAL | 0 refills | Status: DC

## 2016-03-01 MED ORDER — SPIRONOLACTONE 25 MG PO TABS: ORAL_TABLET | ORAL | 0 refills | Status: DC

## 2016-03-03 ENCOUNTER — Other Ambulatory Visit: Payer: Self-pay | Admitting: Cardiology

## 2016-03-07 ENCOUNTER — Other Ambulatory Visit: Payer: Self-pay | Admitting: Cardiology

## 2016-03-08 NOTE — Telephone Encounter (Signed)
Approved    Disp Refills Start End  atorvastatin (LIPITOR) 20 MG tablet 90 tablet 0 03/01/2016   Sig:  TAKE 1 TABLET BY MOUTH EVERY DAY AT 6 PM  Class:  Normal  DAW:  No  Authorizing Provider:  Quintella Reichert, MD  Ordering User:  Drue Dun, CMA  spironolactone (ALDACTONE) 25 MG tablet 90 tablet 0 03/01/2016   Sig:  TAKE 1 TABLET (25 MG TOTAL) BY MOUTH DAILY.  Class:  Normal  DAW:  No  Authorizing Provider:  Quintella Reichert, MD  Ordering User:  Drue Dun, CMA  Visit Pharmacy   [facility] [address] [place], [place] - [address]

## 2016-03-26 IMAGING — CR DG CHEST 2V
2 series · 2 of 2 positions shown · non-contrast
Comparison: 01/08/2014

CLINICAL DATA: Cough, hemoptysis, chest pain

EXAM:
CHEST  2 VIEW

[view not recorded (1 of 2)]
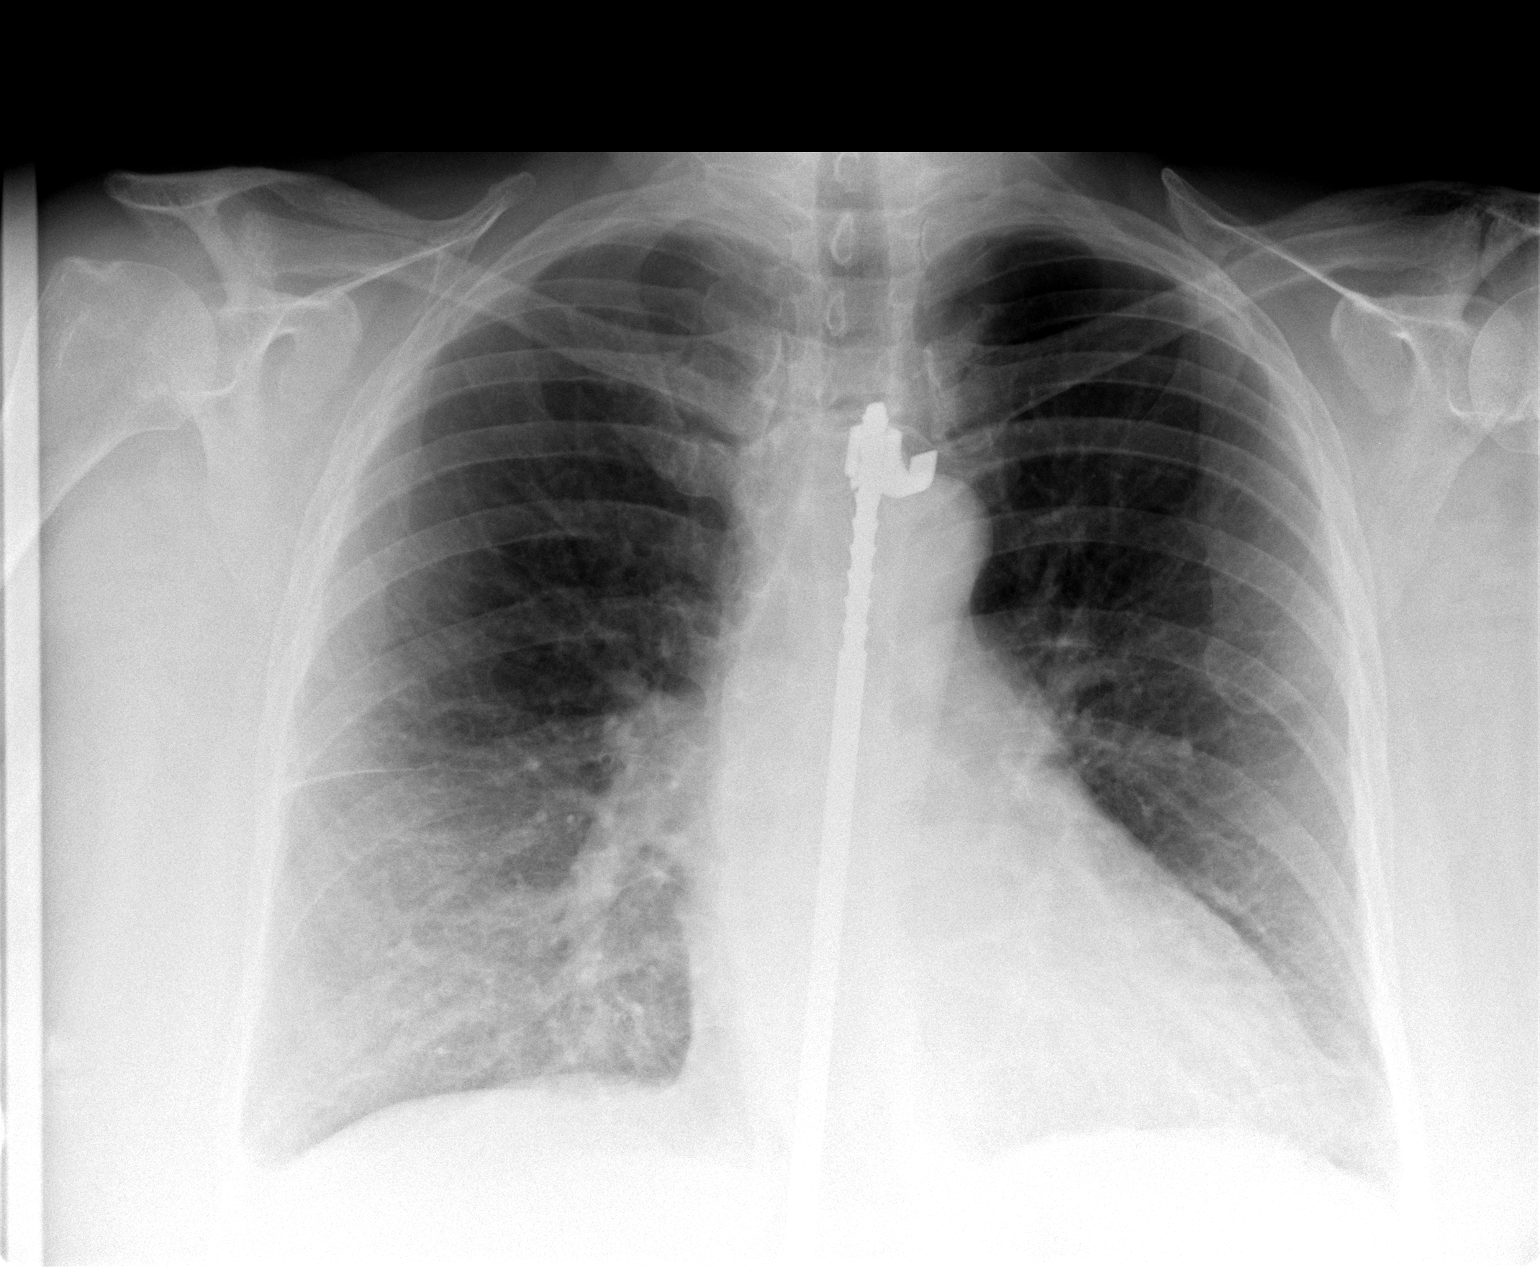

[view not recorded (2 of 2)]
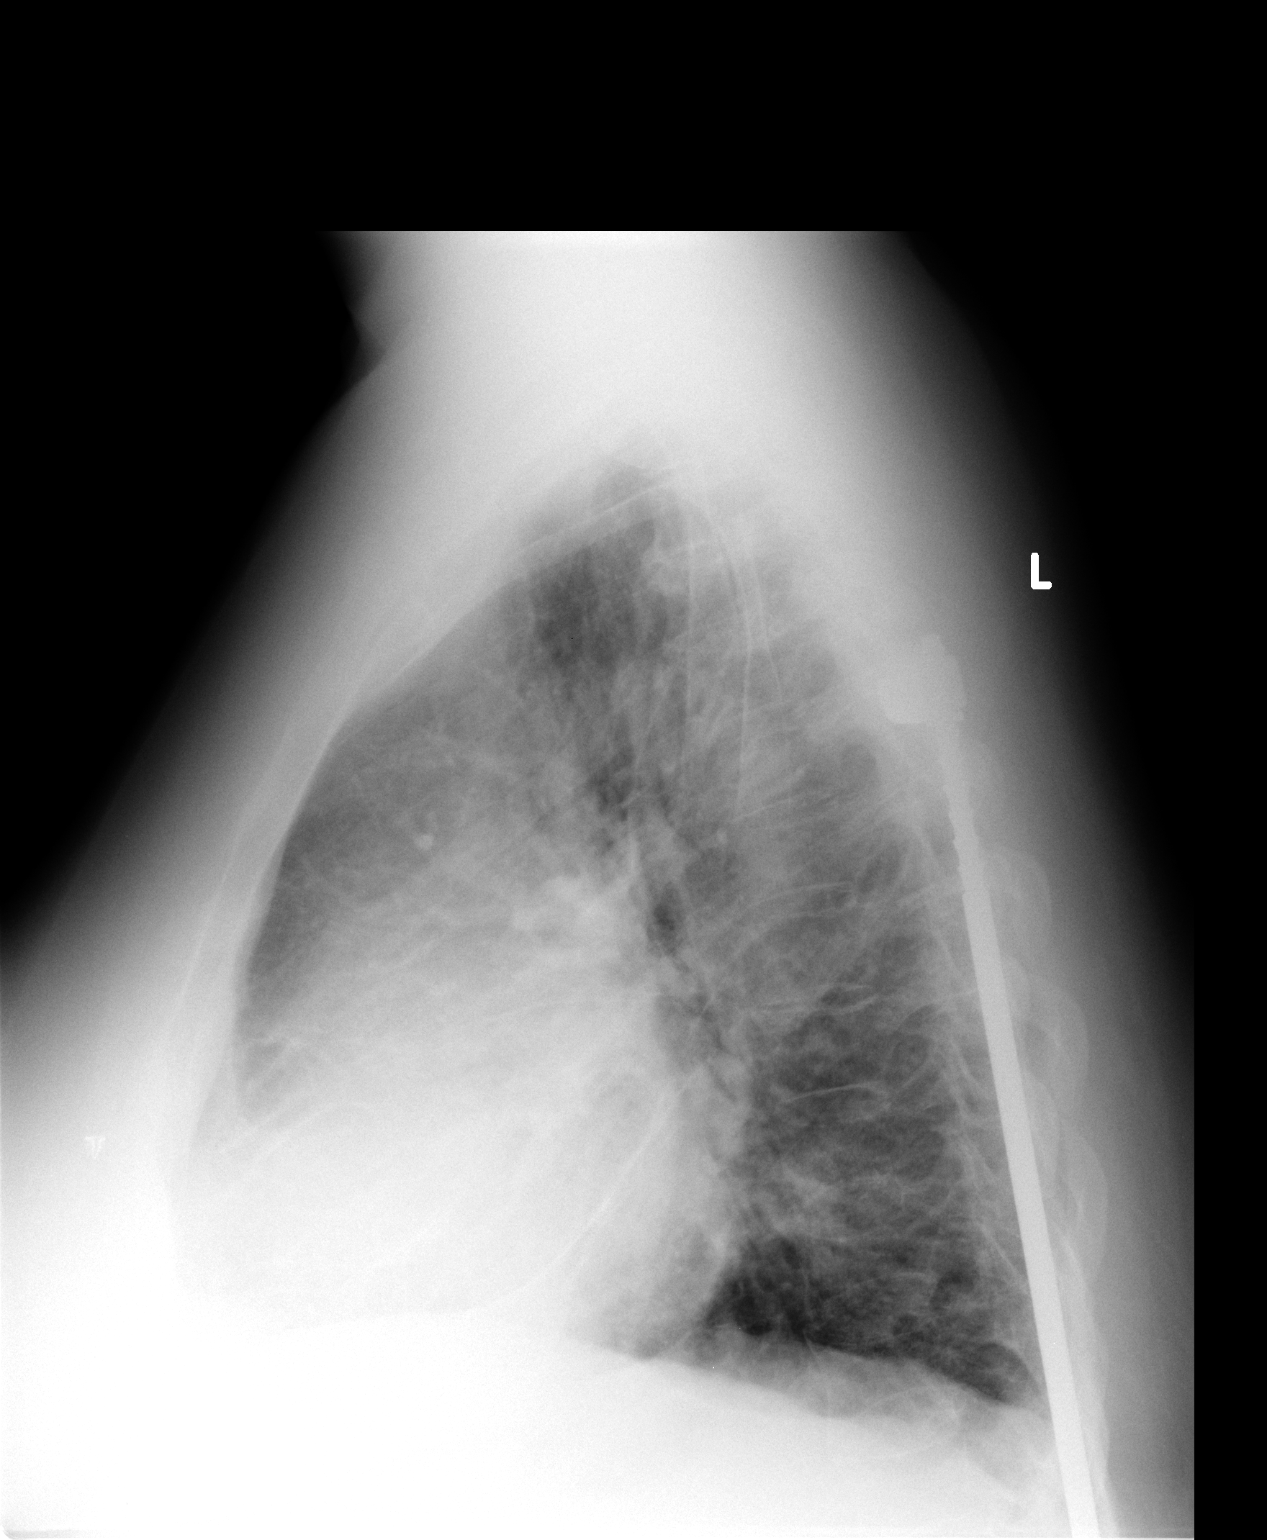

[2 of 2 positions shown; findings below may reference images not displayed]

FINDINGS: The lungs are hyperinflated likely secondary to COPD. There is hazy
right lower lobe airspace disease . There is no pleural effusion or
pneumothorax. The heart and mediastinal contours are unremarkable.

There is a vertical posterior thoracolumbar spine stabilization rod.
IMPRESSION: Hazy right lower lobe airspace disease concerning for pneumonia.

## 2016-04-01 ENCOUNTER — Other Ambulatory Visit: Payer: Self-pay | Admitting: Cardiology

## 2016-05-10 ENCOUNTER — Ambulatory Visit (INDEPENDENT_AMBULATORY_CARE_PROVIDER_SITE_OTHER): Payer: Medicare PPO | Admitting: Cardiology

## 2016-05-10 VITALS — BP 136/76 | HR 67 | Ht 65.0 in | Wt 290.5 lb

## 2016-05-10 DIAGNOSIS — G4733 Obstructive sleep apnea (adult) (pediatric): Secondary | ICD-10-CM

## 2016-05-10 DIAGNOSIS — I5022 Chronic systolic (congestive) heart failure: Secondary | ICD-10-CM

## 2016-05-10 DIAGNOSIS — I10 Essential (primary) hypertension: Secondary | ICD-10-CM | POA: Diagnosis not present

## 2016-05-10 DIAGNOSIS — I420 Dilated cardiomyopathy: Secondary | ICD-10-CM

## 2016-05-10 MED ORDER — SACUBITRIL-VALSARTAN 49-51 MG PO TABS: 1.0000 | ORAL_TABLET | Freq: Two times a day (BID) | ORAL | 3 refills | Status: DC

## 2016-05-10 NOTE — Progress Notes (Signed)
Cardiology Office Note    Date:  05/10/2016   ID:  LOVADA BARWICK, DOB 1960-10-29, MRN [id number]  PCP:  Blane Ohara, MD  Cardiologist:  Armanda Magic, MD   Chief Complaint  Patient presents with  . Congestive Heart Failure  . Cardiomyopathy  . Hypertension  . Sleep Apnea    History of Present Illness:  Judy [name] is a 56 y.o. female with a history of nonischemic DCM with normal coronary arteries by cath with EF 45%.She underwent Cardiac MRI after 3 months of medical therapy which showed persistence of DCM and EF 28%. Serum Ferritin and Serum protein electrophoresis were normal. She was seen by EP who recommended AICD for primary prevention of SCD but she chose not to proceed. She is followed by Dr. Sherene Sires for COPD. She is on home O2 at night.She is here today  for followup. She has moderate OSA with an AHI of 26/hr with oxygen desaturations as low as 70% and is on CPAP at 16cm H2O. She is tolerating her CPAP well. She tolerates the full face mask and feels the pressure is adequate. She continues to have more energy and less daytime sleepiness on CPAP. She feels rested in the am when she gets up. She denies any palpitations or syncope. She has LE edema that has improved. She has chronic DOE which is stable and likely multifactorial from COPD, obesity and sedentary at home. She denies any chest pain or pressure. She has  LE edema which is stable and she thinks it is not as bad.     Past Medical History:  Diagnosis Date  . Chronic systolic CHF (congestive heart failure), NYHA class 3 (HCC) 10/07/2014  . COPD (chronic obstructive pulmonary disease) (HCC)   . DCM (dilated cardiomyopathy) (HCC) 04/25/2014   EF 28% by MRI despite maximum medical therapy  . Former tobacco use   . Hyperlipidemia   . Hypertension   . Obesity   . OSA (obstructive sleep apnea)    moderate with AHI 26/hr with oxygen desaturations as low as 70%  . Scoliosis     Past Surgical History:    Procedure Laterality Date  . ABDOMINAL HYSTERECTOMY    . ankle artery involved cyst removal    . CARDIAC CATHETERIZATION  05/14/2014   normal coronary arteries  . CARPAL TUNNEL RELEASE    . FASCIOTOMY     1993 for platar fasciitis  . harrington rod scoliosis     1981  . LEFT HEART CATHETERIZATION WITH CORONARY ANGIOGRAM N/A 05/14/2014   Procedure: LEFT HEART CATHETERIZATION WITH CORONARY ANGIOGRAM;  Surgeon: Iran Ouch, MD;  Location: [facility] CATH LAB;  Service: Cardiovascular;  Laterality: N/A;  . ROTATOR CUFF REPAIR    . UMBILICAL HERNIA REPAIR    . VESICOVAGINAL FISTULA CLOSURE W/ TAH      Current Medications: Current Meds  Medication Sig  . aspirin 81 MG tablet Take 81 mg by mouth at bedtime.   Marland Kitchen atorvastatin (LIPITOR) 20 MG tablet Take 1 tablet (20 mg total) by mouth daily at 6 PM. Please keep upcoming appt for further refills  . calcium citrate-vitamin D (CITRACAL+D) 315-200 MG-UNIT per tablet Take 1 tablet by mouth daily.  . carvedilol (COREG) 25 MG tablet TAKE 1 TABLET (25 MG TOTAL) BY MOUTH 2 (TWO) TIMES DAILY.  Marland Kitchen Cholecalciferol (VITAMIN D-3) 1000 UNITS CAPS Take 1 capsule by mouth daily.  . furosemide (LASIX) 40 MG tablet Take 1 tablet (40 mg total) by mouth daily.  Marland Kitchen  gabapentin (NEURONTIN) 400 MG capsule Take 2 tablets by mouth in the AM and 2 by mouth in the PM  . levothyroxine (SYNTHROID, LEVOTHROID) 50 MCG tablet Take 50 mcg by mouth daily.  . Lutein-Zeaxanthin 25-5 MG CAPS Take 1 capsule by mouth at bedtime.   . Multiple Vitamin (MULTIVITAMIN WITH MINERALS) TABS tablet Take 1 tablet by mouth daily.  . sacubitril-valsartan (ENTRESTO) 24-26 MG Take 1 tablet by mouth 2 (two) times daily.  Marland Kitchen spironolactone (ALDACTONE) 25 MG tablet TAKE 1 TABLET (25 MG TOTAL) BY MOUTH DAILY.  Marland Kitchen tiZANidine (ZANAFLEX) 2 MG tablet Take 2 mg by mouth daily.  Marland Kitchen UNABLE TO FIND CPAP with o2 2lpm  DME- [facility]    Allergies:   Demerol [meperidine]   Social History   Social History  . Marital  status: Married    Spouse name: N/A  . Number of children: N/A  . Years of education: N/A   Social History Main Topics  . Smoking status: Former Smoker    Packs/day: 1.00    Years: 15.00    Types: Cigarettes    Quit date: 02/14/2005  . Smokeless tobacco: Never Used  . Alcohol use Yes     Comment: occ  . Drug use: No  . Sexual activity: Not on file   Other Topics Concern  . Not on file   Social History Narrative   Lives in [place] with spouse and son.   Previously worked as a Comptroller              Family History:  The patient's family history includes Allergies in her father; Emphysema in her father; Heart disease (age of onset: 61) in her father; Heart failure in her father.   ROS:   Please see the history of present illness.    ROS All other systems reviewed and are negative.  No flowsheet data found.     PHYSICAL EXAM:   VS:  BP 136/76   Pulse 67   Ht 5\' 5"  (1.651 m)   Wt 290 lb 8 oz (131.8 kg)   SpO2 95%   BMI 48.34 kg/m    GEN: Well nourished, well developed, in no acute distress  HEENT: normal  Neck: no JVD, carotid bruits, or masses Cardiac: RRR; no murmurs, rubs, or gallops,no edema.  Intact distal pulses bilaterally.  Respiratory:  clear to auscultation bilaterally, normal work of breathing GI: soft, nontender, nondistended, + BS MS: no deformity or atrophy  Skin: warm and dry, no rash Neuro:  Alert and Oriented x 3, Strength and sensation are intact Psych: euthymic mood, full affect  Wt Readings from Last 3 Encounters:  05/10/16 290 lb 8 oz (131.8 kg)  01/14/16 292 lb 3.2 oz (132.5 kg)  12/09/15 287 lb (130.2 kg)      Studies/Labs Reviewed:   EKG:  EKG is ordered today.  The ekg ordered today demonstrates NSR at 73bpm with low voltage QRS, septal infarct and no ST changes.  No change from 2016  Recent Labs: 10/23/2015: Brain Natriuretic Peptide 24.2 12/09/2015: BUN 13; Creat 0.75; Potassium 4.4; Sodium 143   Lipid  Panel    Component Value Date/Time   CHOL 194 09/03/2014 0856   TRIG 145.0 09/03/2014 0856   HDL 54.40 09/03/2014 0856   CHOLHDL 4 09/03/2014 0856   VLDL 29.0 09/03/2014 0856   LDLCALC 111 (H) 09/03/2014 0856    Additional studies/ records that were reviewed today include:  CPAP download    ASSESSMENT:  1. Chronic systolic CHF (congestive heart failure), NYHA class 2 (HCC)   2. DCM (dilated cardiomyopathy) (HCC)   3. Essential hypertension, benign   4. OSA (obstructive sleep apnea)   5. Severe obesity (BMI >= 40) (HCC)      PLAN:  In order of problems listed above:  1.  Chronic systolic CHF - she appears euvolemc on exam today.  Her weight has been fairly stable.  She will continue on Coreg, Lasix, Entresto and Aldactone.  I will check a BMET today. I will increase Entresto to 49/51mg  BID and then repeat cardiac MRI in 2 months for LVF.  If EF remains < 30% will refer back for consideration again of AICD.   2.  DCM - EF 28% on Cardiac MRI 09/2014.  She refused AICD implant. 3.  HTN - BP controlled on current meds.  She will continue on ARB/BB/aldactone. I will check a BMET. 4.  OSA - the patient is tolerating PAP therapy well without any problems. The PAP download was reviewed today and showed an AHI of 0.7/hr on 13 cm H2O with 100% compliance in using more than 4 hours nightly.  The patient has been using and benefiting from CPAP use and will continue to benefit from therapy.  5.  Severe obesity with BMI > 40.  I have encouraged him to get into a routine exercise program and cut back on carbs and portions.   She will followup with a BMET in 1 week and 3 month followup with PA.      Medication Adjustments/Labs and Tests Ordered: Current medicines are reviewed at length with the patient today.  Concerns regarding medicines are outlined above.  Medication changes, Labs and Tests ordered today are listed in the Patient Instructions below.  There are no Patient Instructions on  file for this visit.   Signed, Armanda Magic, MD  05/10/2016 11:37 AM    [facility] [address] [place], [place], [place]  [address] Phone: [phone]; Fax: [phone]

## 2016-05-10 NOTE — Patient Instructions (Signed)
Medication Instructions:  1) INCREASE ENTRESTO to 49/51 mg TWICE DAILY  Labwork: IN ONE WEEK: BMET  Testing/Procedures: Dr. Mayford Knife recommends you have a CARDIAC MRI in 2 MONTHS.  Follow-Up: Your physician recommends that you schedule a follow-up appointment in 3 MONTHS with Dr. Norris Cross assistant.  Your physician wants you to follow-up in: 6 months with Dr. Mayford Knife. You will receive a reminder letter in the mail two months in advance. If you don't receive a letter, please call our office to schedule the follow-up appointment.   Any Other Special Instructions Will Be Listed Below (If Applicable).     If you need a refill on your cardiac medications before your next appointment, please call your pharmacy.

## 2016-05-12 ENCOUNTER — Other Ambulatory Visit: Payer: Self-pay | Admitting: Cardiology

## 2016-05-12 NOTE — Addendum Note (Signed)
Addended by: Etheleen Mayhew C on: 05/12/2016 07:39 AM   Modules accepted: Orders

## 2016-05-16 ENCOUNTER — Encounter: Payer: Self-pay | Admitting: Cardiology

## 2016-05-27 ENCOUNTER — Other Ambulatory Visit: Payer: Self-pay | Admitting: Cardiology

## 2016-05-30 NOTE — Telephone Encounter (Signed)
Medication Detail    Disp Refills Start End   spironolactone (ALDACTONE) 25 MG tablet 90 tablet 3 05/13/2016    Sig: TAKE 1 TABLET EVERY DAY   E-Prescribing Status: Receipt confirmed by pharmacy (05/13/2016 10:46 AM EDT)   Pharmacy   [facility] - [place], [place] - [address]

## 2016-07-12 ENCOUNTER — Encounter (HOSPITAL_COMMUNITY): Payer: Self-pay

## 2016-07-12 ENCOUNTER — Ambulatory Visit (HOSPITAL_COMMUNITY)
Admission: RE | Admit: 2016-07-12 | Discharge: 2016-07-12 | Disposition: A | Discharge status: Home / Self Care | Payer: Medicare PPO | Source: Ambulatory Visit | Attending: Cardiology | Admitting: Cardiology

## 2016-07-12 DIAGNOSIS — I5022 Chronic systolic (congestive) heart failure: Secondary | ICD-10-CM | POA: Insufficient documentation

## 2016-07-12 DIAGNOSIS — I420 Dilated cardiomyopathy: Secondary | ICD-10-CM | POA: Diagnosis not present

## 2016-07-12 LAB — COMPREHENSIVE METABOLIC PANEL
ALK PHOS: 166 U/L — AB (ref 38–126)
ALT: 39 U/L (ref 14–54)
ANION GAP: 8 (ref 5–15)
AST: 28 U/L (ref 15–41)
Albumin: 3.7 g/dL (ref 3.5–5.0)
BUN: 11 mg/dL (ref 6–20)
CO2: 31 mmol/L (ref 22–32)
Calcium: 9.3 mg/dL (ref 8.9–10.3)
Chloride: 103 mmol/L (ref 101–111)
Creatinine, Ser: 0.66 mg/dL (ref 0.44–1.00)
GLUCOSE: 139 mg/dL — AB (ref 65–99)
Potassium: 4.7 mmol/L (ref 3.5–5.1)
Sodium: 142 mmol/L (ref 135–145)
TOTAL PROTEIN: 6.6 g/dL (ref 6.5–8.1)
Total Bilirubin: 1 mg/dL (ref 0.3–1.2)

## 2016-07-12 LAB — LIPID PANEL
Cholesterol: 146 mg/dL (ref 0–200)
HDL: 41 mg/dL (ref >40)
LDL Cholesterol: 75 mg/dL (ref 0–99)
TRIGLYCERIDES: 151 mg/dL — AB (ref <150)
Total CHOL/HDL Ratio: 3.6 RATIO
VLDL: 30 mg/dL (ref 0–40)

## 2016-07-12 LAB — CBC
HEMATOCRIT: 43 % (ref 36.0–46.0)
HEMOGLOBIN: 13.4 g/dL (ref 12.0–15.0)
MCH: 29.6 pg (ref 26.0–34.0)
MCHC: 31.2 g/dL (ref 30.0–36.0)
MCV: 95.1 fL (ref 78.0–100.0)
Platelets: 219 10*3/uL (ref 150–400)
RBC: 4.52 MIL/uL (ref 3.87–5.11)
RDW: 13.8 % (ref 11.5–15.5)
WBC: 8.2 10*3/uL (ref 4.0–10.5)

## 2016-07-13 ENCOUNTER — Telehealth: Payer: Self-pay | Admitting: Cardiology

## 2016-07-13 ENCOUNTER — Encounter: Payer: Self-pay | Admitting: Physician Assistant

## 2016-07-13 DIAGNOSIS — R931 Abnormal findings on diagnostic imaging of heart and coronary circulation: Secondary | ICD-10-CM

## 2016-07-13 LAB — HEMOGLOBIN A1C
Hgb A1c MFr Bld: 7.1 % — ABNORMAL HIGH (ref 4.8–5.6)
MEAN PLASMA GLUCOSE: 157 mg/dL

## 2016-07-13 NOTE — Telephone Encounter (Signed)
New Message   pt verbalized that she is returning call for rn  She said she missed her MRI appt and it was her fault and that she needs to speak to rn

## 2016-07-13 NOTE — Telephone Encounter (Signed)
Patient went to have cardiac MRI done but did not fit in the scanner so the test was cancelled.   To Dr. Mayford Knife for further recommendations.

## 2016-07-14 NOTE — Telephone Encounter (Signed)
Limited ECHO ordered for scheduling. Patient agrees with treatment plan. 

## 2016-07-14 NOTE — Telephone Encounter (Signed)
Please get a limited echo with definity contrast to assess EF

## 2016-07-15 NOTE — Telephone Encounter (Signed)
Limited ECHO has been scheduled 6/15.

## 2016-07-19 IMAGING — CR DG CHEST 2V
2 series · 2 of 2 positions shown · non-contrast
Comparison: PA and lateral chest x-ray dated February 26, 2014

CLINICAL DATA: Two weeks of cough and congestion and shortness of
breath, history of COPD, CHF, and previous tobacco use

EXAM:
CHEST  2 VIEW

[view not recorded (1 of 2)]
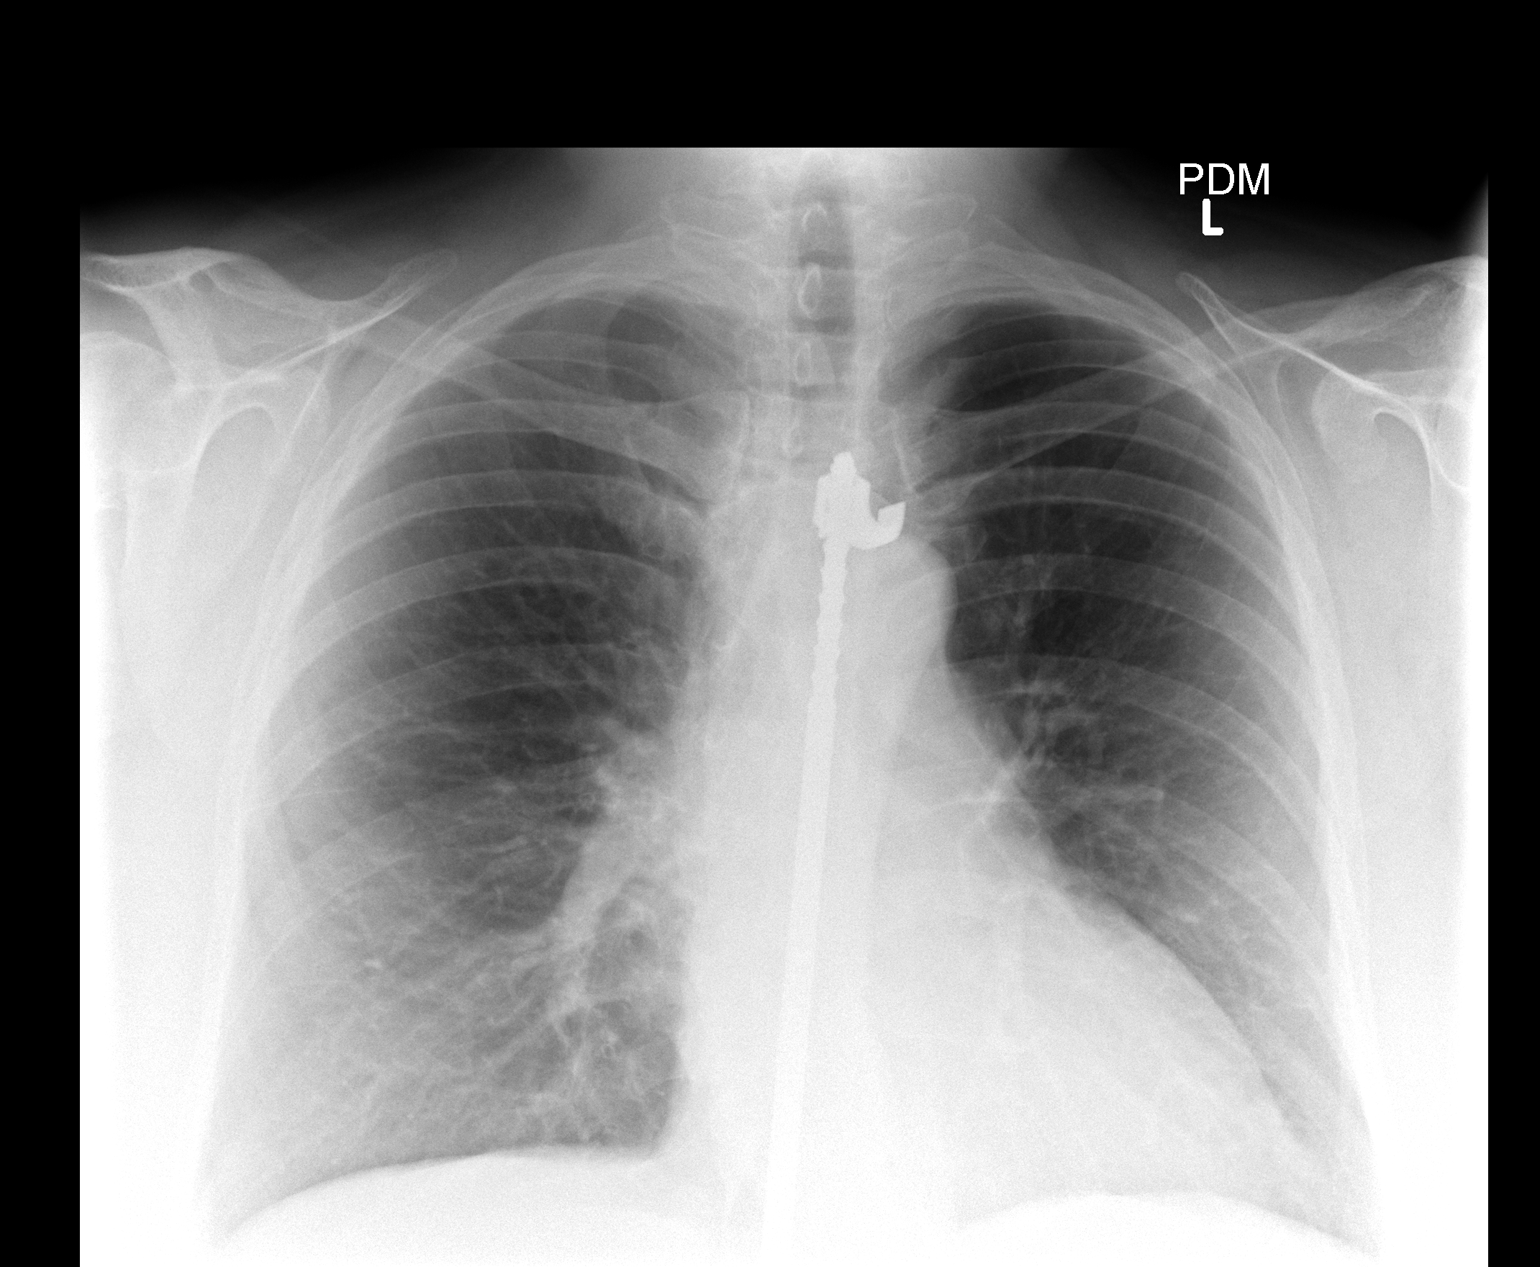

[view not recorded (2 of 2)]
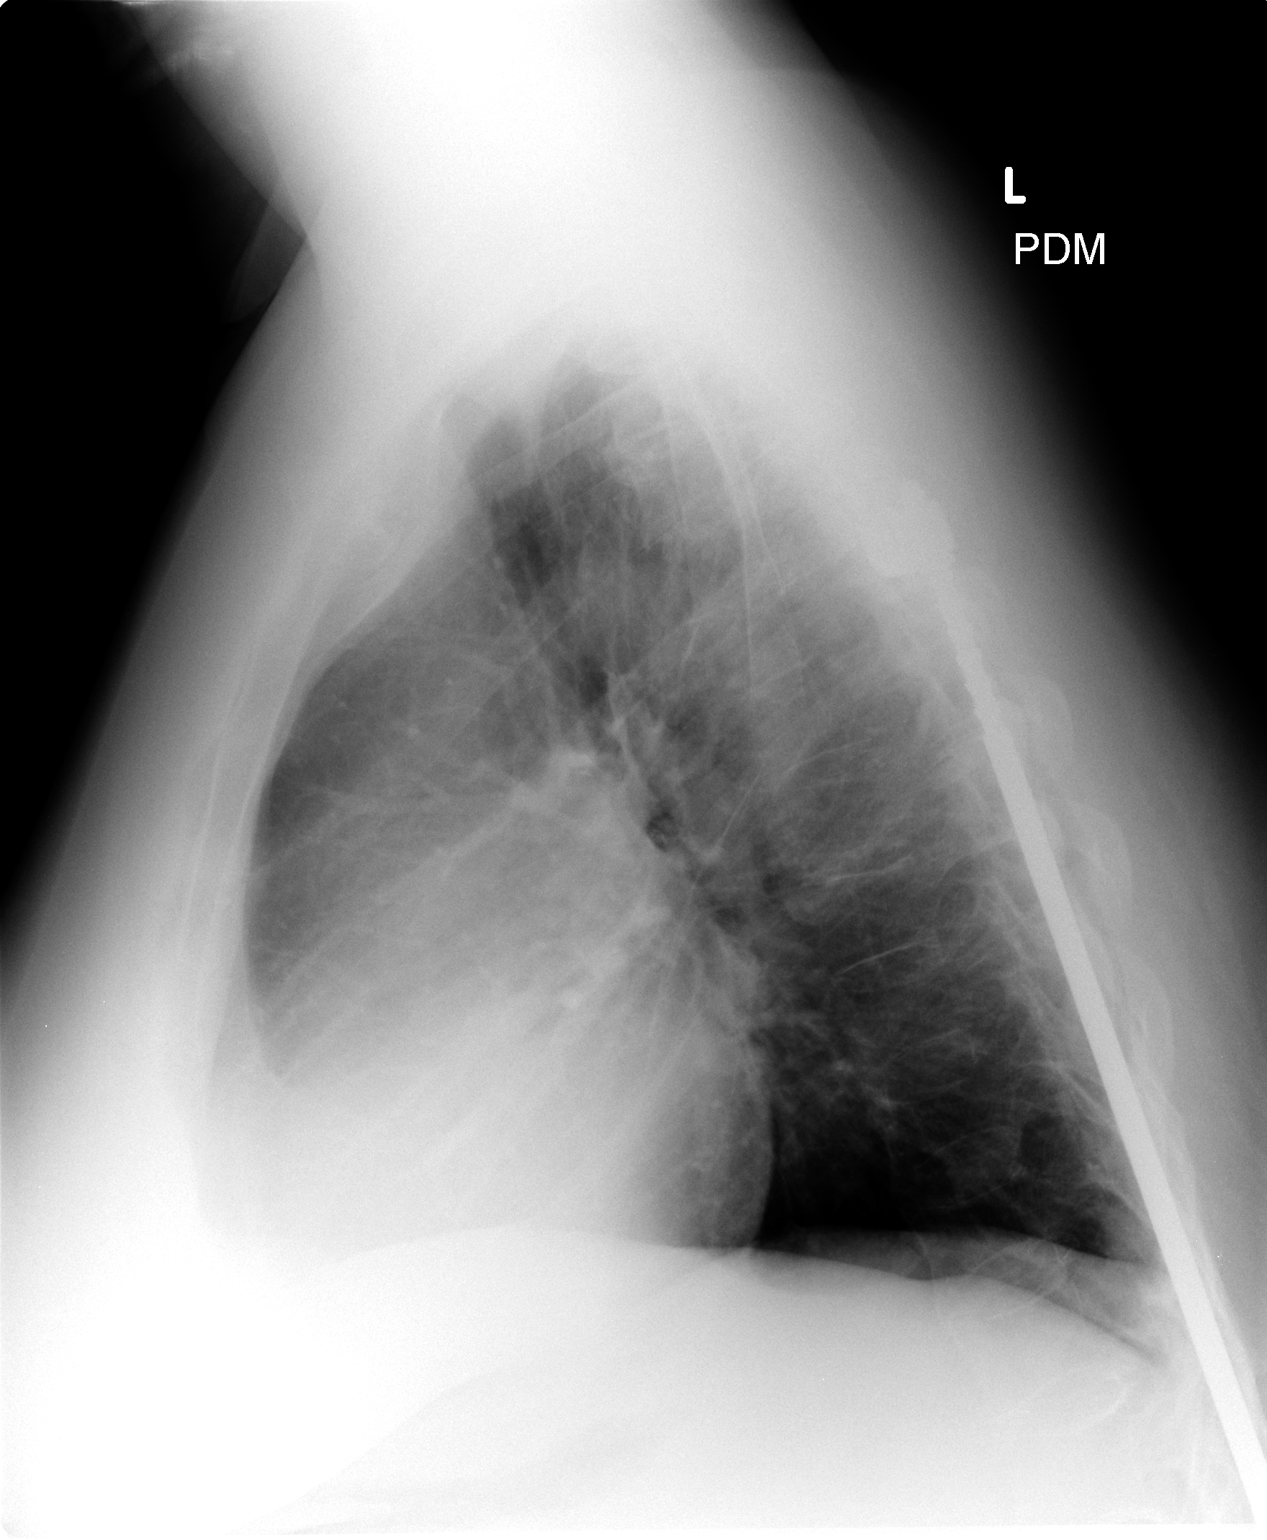

[2 of 2 positions shown; findings below may reference images not displayed]

FINDINGS: The lungs are well-expanded and clear. The heart is top-normal in
size. The pulmonary vascularity is not engorged. The mediastinum is
normal in width. There is no pleural effusion. The bony thorax is
unremarkable. There is a Harrington rod type appliance in place that
appears unchanged where visualized.
IMPRESSION: COPD. There is no CHF, pneumonia, nor other acute cardiopulmonary
abnormality.

## 2016-07-29 ENCOUNTER — Ambulatory Visit (HOSPITAL_COMMUNITY): Payer: Medicare PPO | Attending: Internal Medicine

## 2016-07-29 ENCOUNTER — Other Ambulatory Visit: Payer: Self-pay

## 2016-07-29 DIAGNOSIS — E669 Obesity, unspecified: Secondary | ICD-10-CM | POA: Insufficient documentation

## 2016-07-29 DIAGNOSIS — R931 Abnormal findings on diagnostic imaging of heart and coronary circulation: Secondary | ICD-10-CM | POA: Insufficient documentation

## 2016-07-29 DIAGNOSIS — I429 Cardiomyopathy, unspecified: Secondary | ICD-10-CM | POA: Insufficient documentation

## 2016-07-29 DIAGNOSIS — I340 Nonrheumatic mitral (valve) insufficiency: Secondary | ICD-10-CM | POA: Insufficient documentation

## 2016-07-29 DIAGNOSIS — I110 Hypertensive heart disease with heart failure: Secondary | ICD-10-CM | POA: Diagnosis not present

## 2016-07-29 DIAGNOSIS — G4730 Sleep apnea, unspecified: Secondary | ICD-10-CM | POA: Insufficient documentation

## 2016-07-29 DIAGNOSIS — Z6841 Body Mass Index (BMI) 40.0 and over, adult: Secondary | ICD-10-CM | POA: Insufficient documentation

## 2016-07-29 DIAGNOSIS — I509 Heart failure, unspecified: Secondary | ICD-10-CM | POA: Diagnosis not present

## 2016-07-29 MED ORDER — PERFLUTREN LIPID MICROSPHERE
1.0000 mL | INTRAVENOUS | Status: AC | PRN
  Administered 2016-07-29 (×2): 2 mL via INTRAVENOUS

## 2016-07-30 ENCOUNTER — Other Ambulatory Visit: Payer: Self-pay | Admitting: Cardiology

## 2016-08-02 ENCOUNTER — Encounter: Payer: Self-pay | Admitting: Physician Assistant

## 2016-08-02 NOTE — Progress Notes (Signed)
Cardiology Office Note    Date:  08/03/2016  ID:  Judy [name], DOB 1960-09-16, MRN [id number] PCP:  Rochel Brome, MD (Inactive)  Cardiologist:  Dr. Radford Pax   Chief Complaint: f/u CHF  History of Present Illness:  Judy [name] is a 56 y.o. female with history of NICM, chronic combined CHF, COPD, former tobacco abuse, HTN, HLD, morbid obesity who presents for f/u of CHF. Prior echo 12/2013 showed EF 45-50%. She had abnormal stress test 04/2014 resulting in cardiac cath 05/14/14 showing no significant CAD, EF 30%. Subsequent echo 08/2014 (poor quality) with EF approximately 35%. She was treated with medical therapy and subsequent cMRI 09/2014 showed diffuse HK, EF 28%, no infiltrative disease. Previous AICD recommended but she had been hesitant to pursue. Since that time, she has been following up regularly for med optimization. At last OV 05/10/16, her Delene Loll was increased further with recommendation to repeat cMRI in 2 months. Unfortunately she was unable to fit in the MRI scanner so the test was cancelled so f/u echo was obtained 07/29/16 showed EF approximately 45-50%, mild LVH, grade 2 DD, mild MR, mildly reduced RV function, PASP 75mHg. Last labs 06/2016 showed A1c 7.1, LDL 75, trig 151, K 4.7, BUN 11, Cr 0.66, LFTs wnl except alk phos 166, normal CBC.  She presents back for follow-up today with her husband. She does feel better overall. She continues to struggle with exertional dyspnea and remains generally sedentary. She has been trying to gradually increase exercise. She is here in a scooter today and uses a cane to walk. She denies any chest pain or palpitations.    Past Medical History:  Diagnosis Date  . Chronic combined systolic and diastolic CHF (congestive heart failure) ([facility] 10/07/2014  . COPD (chronic obstructive pulmonary disease) ([facility]   . DCM (dilated cardiomyopathy) ([facility] 04/25/2014   EF 28% by MRI despite maximum medical therapy  . Former tobacco use   . Hyperlipidemia    . Hypertension   . Morbid obesity ([facility]   . NICM (nonischemic cardiomyopathy) ([facility]   . OSA (obstructive sleep apnea)    moderate with AHI 26/hr with oxygen desaturations as low as 70%  . Scoliosis     Past Surgical History:  Procedure Laterality Date  . ABDOMINAL HYSTERECTOMY    . ankle artery involved cyst removal    . CARDIAC CATHETERIZATION  05/14/2014   normal coronary arteries  . CARPAL TUNNEL RELEASE    . FASCIOTOMY     1993 for platar fasciitis  . harrington rod scoliosis     1981  . LEFT HEART CATHETERIZATION WITH CORONARY ANGIOGRAM N/A 05/14/2014   Procedure: LEFT HEART CATHETERIZATION WITH CORONARY ANGIOGRAM;  Surgeon: MWellington Hampshire MD;  [facility];  Service: Cardiovascular;  Laterality: N/A;  . ROTATOR CUFF REPAIR    . UMBILICAL HERNIA REPAIR    . VESICOVAGINAL FISTULA CLOSURE W/ TAH      Current Medications: Current Outpatient Prescriptions  Medication Sig Dispense Refill  . aspirin 81 MG tablet Take 81 mg by mouth at bedtime.     .Marland Kitchenatorvastatin (LIPITOR) 20 MG tablet TAKE 1 TABLET BY MOUTH EVERY DAY AT 6 PM 90 tablet 3  . buPROPion (WELLBUTRIN XL) 150 MG 24 hr tablet Take 300 mg by mouth daily.  0  . calcium citrate-vitamin D (CITRACAL+D) 315-200 MG-UNIT per tablet Take 1 tablet by mouth daily.    . carvedilol (COREG) 25 MG tablet TAKE 1 TABLET TWICE DAILY 180 tablet 2  .  Cholecalciferol (VITAMIN D-3) 1000 UNITS CAPS Take 1 capsule by mouth daily.    . furosemide (LASIX) 40 MG tablet Take 1 tablet (40 mg total) by mouth daily. (Patient taking differently: Take 20 mg by mouth daily. ) 90 tablet 3  . gabapentin (NEURONTIN) 400 MG capsule Take 2 tablets by mouth in the AM and 2 by mouth in the PM  1  . levothyroxine (SYNTHROID, LEVOTHROID) 50 MCG tablet Take 50 mcg by mouth daily.  0  . Lutein-Zeaxanthin 25-5 MG CAPS Take 1 capsule by mouth at bedtime.     . metFORMIN (GLUCOPHAGE) 500 MG tablet Take 500 mg by mouth daily.    . Multiple Vitamin  (MULTIVITAMIN WITH MINERALS) TABS tablet Take 1 tablet by mouth daily.    . sacubitril-valsartan (ENTRESTO) 49-51 MG Take 1 tablet by mouth 2 (two) times daily. 180 tablet 3  . spironolactone (ALDACTONE) 25 MG tablet TAKE 1 TABLET EVERY DAY 90 tablet 3  . tiZANidine (ZANAFLEX) 4 MG tablet Take 4 mg by mouth daily.    Marland Kitchen UNABLE TO FIND CPAP with o2 2lpm  DME- [facility]     No current facility-administered medications for this visit.      Allergies:   Demerol [meperidine]   Social History   Social History  . Marital status: Married    Spouse name: N/A  . Number of children: N/A  . Years of education: N/A   Social History Main Topics  . Smoking status: Former Smoker    Packs/day: 1.00    Years: 15.00    Types: Cigarettes    Quit date: 02/14/2005  . Smokeless tobacco: Never Used  . Alcohol use Yes     Comment: occ  . Drug use: No  . Sexual activity: Not on file   Other Topics Concern  . Not on file   Social History Narrative   Lives in [place] with spouse and son.   Previously worked as a Engineer, manufacturing              Family History:  Family History  Problem Relation Age of Onset  . Emphysema Father   . Allergies Father   . Heart failure Father   . Heart disease Father 98       MI    ROS:   Please see the history of present illness. All other systems are reviewed and otherwise negative.    PHYSICAL EXAM:   VS:  BP 132/72 Comment: left arm, third attempt  Pulse 63   Ht '5\' 5"'  (1.651 m)   Wt 289 lb 9.6 oz (131.4 kg)   SpO2 92%   BMI 48.19 kg/m   BMI: Body mass index is 48.19 kg/m. GEN: Well nourished, well developed obese WF, in no acute distress  HEENT: normocephalic, atraumatic Neck: no JVD, carotid bruits, or masses Cardiac: RRR; no murmurs, rubs, or gallops, no edema  Respiratory:  clear to auscultation bilaterally, normal work of breathing GI: soft, nontender, nondistended, + BS MS: no deformity or atrophy  Skin: warm and dry, no  rash Neuro:  Alert and Oriented x 3, Strength and sensation are intact, follows commands Psych: euthymic mood, full affect  Wt Readings from Last 3 Encounters:  08/03/16 289 lb 9.6 oz (131.4 kg)  05/10/16 290 lb 8 oz (131.8 kg)  01/14/16 292 lb 3.2 oz (132.5 kg)      Studies/Labs Reviewed:   EKG: EKG was not ordered today  Recent Labs: 10/23/2015: Brain Natriuretic Peptide 24.2  07/12/2016: ALT 39; BUN 11; Creatinine, Ser 0.66; Hemoglobin 13.4; Platelets 219; Potassium 4.7; Sodium 142   Lipid Panel    Component Value Date/Time   CHOL 146 07/12/2016 1115   TRIG 151 (H) 07/12/2016 1115   HDL 41 07/12/2016 1115   CHOLHDL 3.6 07/12/2016 1115   VLDL 30 07/12/2016 1115   LDLCALC 75 07/12/2016 1115    Additional studies/ records that were reviewed today include: Summarized above.    ASSESSMENT & PLAN:   1. Chronic combined CHF/NICM - significant improvement in LVEF with ongoing med titration. She continues to note exertional dyspnea, suspect multifactorial given her severe obesity and lack of regular exercise. We had long discussion about lifestyle changes. BP minimally elevated above goal today. Will titrate Entresto further to max dose 97/130m BID with recheck BP check in 2 weeks - if tolerating med at time, would recommend pharmacist obtain BMET as well. Continue BB and carvedilol. Volume status appears stable. No further need for ICD conversation at this time given LVEF improvement. She states EDelene Lollis expensive - I have asked her to look into assistance options on [email or web address] If she does not get anywhere with this, would appreciate any input pharmacist has at time of f/u BP recheck. 2. Morbid obesity - long discussion regarding this - healthy food and exercise regimen. Discussed recommendation of using app to track food intake. She has lost 3lb in 6 months' time. 3. OSA - continue CPAP. 4. Essential HTN - follow BP with med change. 5. Elevated alk phos level - incidentally  noted on previous labs. Instructed to discuss further with PCP.  Disposition: F/u with Dr. TRadford Paxas previously recommended 10/2016.   Medication Adjustments/Labs and Tests Ordered: Current medicines are reviewed at length with the patient today.  Concerns regarding medicines are outlined above. Medication changes, Labs and Tests ordered today are summarized above and listed in the Patient Instructions accessible in Encounters.   Signed, DCharlie Pitter PA-C  08/03/2016 10:35 AM    [facility] [address] [place]  [id number]: [phone] Fax: [phone]

## 2016-08-03 ENCOUNTER — Encounter: Payer: Self-pay | Admitting: Physician Assistant

## 2016-08-03 ENCOUNTER — Ambulatory Visit (INDEPENDENT_AMBULATORY_CARE_PROVIDER_SITE_OTHER): Payer: Medicare PPO | Admitting: Physician Assistant

## 2016-08-03 ENCOUNTER — Other Ambulatory Visit: Payer: Self-pay | Admitting: *Deleted

## 2016-08-03 VITALS — BP 132/72 | HR 63 | Ht 65.0 in | Wt 289.6 lb

## 2016-08-03 DIAGNOSIS — I10 Essential (primary) hypertension: Secondary | ICD-10-CM | POA: Diagnosis not present

## 2016-08-03 DIAGNOSIS — G4733 Obstructive sleep apnea (adult) (pediatric): Secondary | ICD-10-CM

## 2016-08-03 DIAGNOSIS — I5042 Chronic combined systolic (congestive) and diastolic (congestive) heart failure: Secondary | ICD-10-CM | POA: Diagnosis not present

## 2016-08-03 MED ORDER — SACUBITRIL-VALSARTAN 97-103 MG PO TABS: 1.0000 | ORAL_TABLET | Freq: Two times a day (BID) | ORAL | 11 refills | Status: DC

## 2016-08-03 MED ORDER — SACUBITRIL-VALSARTAN 97-103 MG PO TABS: 1.0000 | ORAL_TABLET | Freq: Two times a day (BID) | ORAL | 3 refills | Status: DC

## 2016-08-03 NOTE — Patient Instructions (Addendum)
Medication Instructions:  Your physician has recommended you make the following change in your medication: 1.) START Entresto 103/97. You will take this twice daily.   Labwork: None Ordered   Testing/Procedures: None Ordered   Follow-Up: Your physician recommends that you schedule a follow-up appointment in: 2 weeks with pharmacy visit  Keep your appointment in September as planned.   Any Other Special Instructions Will Be Listed Below (If Applicable).     If you need a refill on your cardiac medications before your next appointment, please call your pharmacy.    Please discuss your elevated alkaline phosphatase level with your primary care provider. MyFitnessPal is the name of the app to help track your food choices. Please visit [email or web address] to evaluate for assistance options.

## 2016-08-19 ENCOUNTER — Ambulatory Visit: Payer: Medicare PPO

## 2016-09-01 ENCOUNTER — Encounter: Payer: Self-pay | Admitting: Pharmacist

## 2016-09-01 ENCOUNTER — Ambulatory Visit (INDEPENDENT_AMBULATORY_CARE_PROVIDER_SITE_OTHER): Payer: Medicare PPO | Admitting: Pharmacist

## 2016-09-01 VITALS — BP 108/68 | HR 79

## 2016-09-01 DIAGNOSIS — I10 Essential (primary) hypertension: Secondary | ICD-10-CM

## 2016-09-01 MED ORDER — FUROSEMIDE 20 MG PO TABS: 20.0000 mg | ORAL_TABLET | Freq: Every day | ORAL | 3 refills | Status: DC

## 2016-09-01 NOTE — Patient Instructions (Addendum)
Call [phone] for the [facility] patient assistance.   Return for a follow up appointment in 6-8 weeks  Check your blood pressure at home daily (if able) and keep record of the readings.  Take your BP meds as follows: Continue all medications as prescribed   Bring all of your meds, your BP cuff and your record of home blood pressures to your next appointment.  Exercise as you're able, try to walk approximately 30 minutes per day.  Keep salt intake to a minimum, especially watch canned and prepared boxed foods.  Eat more fresh fruits and vegetables and fewer canned items.  Avoid eating in fast food restaurants.    HOW TO TAKE YOUR BLOOD PRESSURE: . Rest 5 minutes before taking your blood pressure. .  Don't smoke or drink caffeinated beverages for at least 30 minutes before. . Take your blood pressure before (not after) you eat. . Sit comfortably with your back supported and both feet on the floor (don't cross your legs). . Elevate your arm to heart level on a table or a desk. . Use the proper sized cuff. It should fit smoothly and snugly around your bare upper arm. There should be enough room to slip a fingertip under the cuff. The bottom edge of the cuff should be 1 inch above the crease of the elbow. . Ideally, take 3 measurements at one sitting and record the average.

## 2016-09-01 NOTE — Progress Notes (Signed)
Patient ID: Judy [name]                 DOB: 07-Sep-1960                      MRN: [id number]     HPI: Judy [name] is a 56 y.o. female patient of Dr.Turner who presents today for hypertension evaluation.  PMH includes NICM, chronic combined CHF, COPD, former tobacco abuse, HTN, HLD, morbid obesity who presents for f/u of CHF. Prior echo 12/2013 showed EF 45-50%. She had abnormal stress test 04/2014 resulting in cardiac cath 05/14/14 showing no significant CAD, EF 30%. Subsequent echo 08/2014 (poor quality) with EF approximately 35%. At her most recent visit with Ronie Spies, PA lifestyle modifications were emphasized and her Entresto dose was increased to max dose. She was also having difficulty affording Entresto.   She presents today with her husband. They state that they have made improvements in diet and exercise over the last few months to help, but they know this is an area that still needs work.   The patient reports she has done well with increased dose of Entresto. She states she had to stop metformin due to diarrhea. This has improved but still some present and she wonders if Sherryll Burger is contributing. She also reports her  copay of the Sherryll Burger is $482 per month. She is not certain if she is in the donut hole or not.   She also inquires about burping. She states she is constantly burping for the last few months. This is not increased with drinking or eating anything in particular and is only relieved by chewing gum. She questions if this is medication related as well. She is not certain what medication may have been started around this time. Advised that rarely reported with buproprion with increased burping, but could be likely be unrelated to medications.  She denies dizziness, SOB, and chest pain.   Current HTN meds:  Spironolactone 25mg  daily in the evening Entresto 97/103mg  BID Furosemide 20mg  daily Carvedilol 25mg  BID  BP goal: <130/80  Family History: Father with heart  failure and MI at age 58.   Social History: Former smoker. Denies smokeless tobacco products. Occasional alcohol use.   Diet: Doing better at preparing from home and eating proteins and portion control. Difficult this week with family reunion. Has dramatically decreased sugar intake.   Exercise: Was doing recumbant bike 3-4 times per week for about 10 minutes.   Home BP readings: She does not check regularly  Wt Readings from Last 3 Encounters:  08/03/16 289 lb 9.6 oz (131.4 kg)  05/10/16 290 lb 8 oz (131.8 kg)  01/14/16 292 lb 3.2 oz (132.5 kg)   BP Readings from Last 3 Encounters:  09/01/16 108/68  08/03/16 132/72  05/10/16 136/76   Pulse Readings from Last 3 Encounters:  09/01/16 79  08/03/16 63  05/10/16 67    Renal function: CrCl cannot be calculated (Patient's most recent lab result is older than the maximum 21 days allowed.).  Past Medical History:  Diagnosis Date  . Chronic combined systolic and diastolic CHF (congestive heart failure) (HCC) 10/07/2014  . COPD (chronic obstructive pulmonary disease) (HCC)   . DCM (dilated cardiomyopathy) (HCC) 04/25/2014   EF 28% by MRI despite maximum medical therapy  . Former tobacco use   . Hyperlipidemia   . Hypertension   . Morbid obesity (HCC)   . NICM (nonischemic cardiomyopathy) (HCC)   . OSA (obstructive  sleep apnea)    moderate with AHI 26/hr with oxygen desaturations as low as 70%  . Scoliosis     Current Outpatient Prescriptions on File Prior to Visit  Medication Sig Dispense Refill  . aspirin 81 MG tablet Take 81 mg by mouth at bedtime.     Marland Kitchen atorvastatin (LIPITOR) 20 MG tablet TAKE 1 TABLET BY MOUTH EVERY DAY AT 6 PM 90 tablet 3  . buPROPion (WELLBUTRIN XL) 150 MG 24 hr tablet Take 300 mg by mouth daily.  0  . calcium citrate-vitamin D (CITRACAL+D) 315-200 MG-UNIT per tablet Take 1 tablet by mouth daily.    . carvedilol (COREG) 25 MG tablet TAKE 1 TABLET TWICE DAILY 180 tablet 2  . Cholecalciferol (VITAMIN D-3)  1000 UNITS CAPS Take 1 capsule by mouth daily.    Marland Kitchen gabapentin (NEURONTIN) 400 MG capsule Take 2 tablets by mouth in the AM and 2 by mouth in the PM  1  . levothyroxine (SYNTHROID, LEVOTHROID) 50 MCG tablet Take 50 mcg by mouth daily.  0  . Lutein-Zeaxanthin 25-5 MG CAPS Take 1 capsule by mouth 2 (two) times daily.     . Multiple Vitamin (MULTIVITAMIN WITH MINERALS) TABS tablet Take 1 tablet by mouth daily.    . sacubitril-valsartan (ENTRESTO) 97-103 MG Take 1 tablet by mouth 2 (two) times daily. 180 tablet 3  . spironolactone (ALDACTONE) 25 MG tablet TAKE 1 TABLET EVERY DAY 90 tablet 3  . tiZANidine (ZANAFLEX) 4 MG tablet Take 4 mg by mouth daily.    Marland Kitchen UNABLE TO FIND CPAP with o2 2lpm  DME- [facility]     No current facility-administered medications on file prior to visit.     Allergies  Allergen Reactions  . Demerol [Meperidine] Nausea And Vomiting    Blood pressure 108/68, pulse 79.   Assessment/Plan: Hypertension: BP today below goal. Will continue current medications and check BMET today to ensure no change in electrolytes/kidney function on higher dose. She will follow up in HTN clinic in 6-8 weeks unless appt made for similar timeframe for Dr. Mayford Knife. She was provided phone number to [facility] for patient assistance for [place]. She is aware to call with any additional questions or concerns.    Thank you, Freddie Apley. Cleatis Polka, PharmD  [facility]  09/01/2016 4:13 PM

## 2016-09-02 LAB — BASIC METABOLIC PANEL
BUN / CREAT RATIO: 19 (ref 9–23)
BUN: 14 mg/dL (ref 6–24)
CALCIUM: 9.6 mg/dL (ref 8.7–10.2)
CHLORIDE: 102 mmol/L (ref 96–106)
CO2: 28 mmol/L (ref 20–29)
Creatinine, Ser: 0.75 mg/dL (ref 0.57–1.00)
GFR calc non Af Amer: 89 mL/min/{1.73_m2} (ref >59)
GFR, EST AFRICAN AMERICAN: 103 mL/min/{1.73_m2} (ref >59)
Glucose: 165 mg/dL — ABNORMAL HIGH (ref 65–99)
POTASSIUM: 4.4 mmol/L (ref 3.5–5.2)
Sodium: 144 mmol/L (ref 134–144)

## 2016-10-13 ENCOUNTER — Ambulatory Visit (INDEPENDENT_AMBULATORY_CARE_PROVIDER_SITE_OTHER): Payer: Medicare PPO | Admitting: Pharmacist

## 2016-10-13 VITALS — BP 112/66 | HR 69

## 2016-10-13 DIAGNOSIS — I5022 Chronic systolic (congestive) heart failure: Secondary | ICD-10-CM

## 2016-10-13 DIAGNOSIS — I10 Essential (primary) hypertension: Secondary | ICD-10-CM | POA: Diagnosis not present

## 2016-10-13 NOTE — Progress Notes (Signed)
Patient ID: Judy [name]                 DOB: [birth year]                      MRN: [id number]     HPI: Judy [name] is a 56 y.o. female patient of Dr.Turner who presents today for hypertension evaluation.  PMH includes NICM, chronic combined CHF, COPD, former tobacco abuse, HTN, HLD, morbid obesity who presents for f/u of CHF. Prior echo 12/2013 showed EF 45-50%. She had abnormal stress test 04/2014 resulting in cardiac cath 05/14/14 showing no significant CAD, EF 30%. Subsequent echo 08/2014 (poor quality) with EF approximately 35%. At her most recent visit with Ronie Spies, PA lifestyle modifications were emphasized and her Entresto dose was increased to max dose. She was also having difficulty affording Entresto. At pt's last HTN clinic visit, her BP was at goal. BMET obtained after the visit was normal. Current regimen was continued, and she was provided phone number to [facility] for patient assistance for [place].   Pt presents to clinic for f/u. States that she is tolerating her medication regimen well. Denies dizziness, lightheadedness, or falls. She still has shortness of breath but feels like it's gotten a little bit better since increasing Entresto. She is able to get Entresto more affordably through [employer]. Does not feel increasingly weak or fatigued. Went to PCP office this morning and BP was 116/70. BP is 112/66 in clinic. HR is 69.   Current HTN meds:  Spironolactone 25mg  daily in the evening Entresto 97/103mg  BID Furosemide 20mg  daily Carvedilol 25mg  BID  BP goal: <130/80    Family History: Father with heart failure and MI at age 61.    Social History: Former smoker. Denies smokeless tobacco products. Occasional alcohol use.   Diet: Doing better at preparing from home and eating proteins and portion control. Difficult this week with family reunion. Has dramatically decreased sugar intake.   Exercise: Was doing recumbant bike 3-4 times per week for about 10  minutes.   Home BP readings: She does not check regularly  Wt Readings from Last 3 Encounters:  08/03/16 289 lb 9.6 oz (131.4 kg)  05/10/16 290 lb 8 oz (131.8 kg)  01/14/16 292 lb 3.2 oz (132.5 kg)   BP Readings from Last 3 Encounters:  09/01/16 108/68  08/03/16 132/72  05/10/16 136/76   Pulse Readings from Last 3 Encounters:  09/01/16 79  08/03/16 63  05/10/16 67    Renal function: CrCl cannot be calculated (Patient's most recent lab result is older than the maximum 21 days allowed.).  Past Medical History:  Diagnosis Date  . Chronic combined systolic and diastolic CHF (congestive heart failure) (HCC) 10/07/2014  . COPD (chronic obstructive pulmonary disease) (HCC)   . DCM (dilated cardiomyopathy) (HCC) 04/25/2014   EF 28% by MRI despite maximum medical therapy  . Former tobacco use   . Hyperlipidemia   . Hypertension   . Morbid obesity (HCC)   . NICM (nonischemic cardiomyopathy) (HCC)   . OSA (obstructive sleep apnea)    moderate with AHI 26/hr with oxygen desaturations as low as 70%  . Scoliosis     Current Outpatient Prescriptions on File Prior to Visit  Medication Sig Dispense Refill  . aspirin 81 MG tablet Take 81 mg by mouth at bedtime.     Marland Kitchen atorvastatin (LIPITOR) 20 MG tablet TAKE 1 TABLET BY MOUTH EVERY DAY AT 6 PM  90 tablet 3  . buPROPion (WELLBUTRIN XL) 150 MG 24 hr tablet Take 300 mg by mouth daily.  0  . calcium citrate-vitamin D (CITRACAL+D) 315-200 MG-UNIT per tablet Take 1 tablet by mouth daily.    . carvedilol (COREG) 25 MG tablet TAKE 1 TABLET TWICE DAILY 180 tablet 2  . Cholecalciferol (VITAMIN D-3) 1000 UNITS CAPS Take 1 capsule by mouth daily.    . furosemide (LASIX) 20 MG tablet Take 1 tablet (20 mg total) by mouth daily. 90 tablet 3  . gabapentin (NEURONTIN) 400 MG capsule Take 2 tablets by mouth in the AM and 2 by mouth in the PM  1  . levothyroxine (SYNTHROID, LEVOTHROID) 50 MCG tablet Take 50 mcg by mouth daily.  0  . Lutein-Zeaxanthin 25-5  MG CAPS Take 1 capsule by mouth 2 (two) times daily.     . Multiple Vitamin (MULTIVITAMIN WITH MINERALS) TABS tablet Take 1 tablet by mouth daily.    . sacubitril-valsartan (ENTRESTO) 97-103 MG Take 1 tablet by mouth 2 (two) times daily. 180 tablet 3  . spironolactone (ALDACTONE) 25 MG tablet TAKE 1 TABLET EVERY DAY 90 tablet 3  . tiZANidine (ZANAFLEX) 4 MG tablet Take 4 mg by mouth daily.    Marland Kitchen UNABLE TO FIND CPAP with o2 2lpm  DME- [facility]     No current facility-administered medications on file prior to visit.     Allergies  Allergen Reactions  . Demerol [Meperidine] Nausea And Vomiting     Assessment/Plan:  1. Hypertension: Pt's BP is at her goal of <130/80 mmHg. Given that her BP was in the 100s and low 110s for the past two visits, will not make any medication changes at this time. Continue current medication regimen as prescribed. Instructed pt to contact the clinic if she experiences increased dizziness, lightheadedness, or fatigue. F/u with pt in HTN clinic as needed.  Durward Mallard, PharmD Student.   Thank you, Freddie Apley. Cleatis Polka, PharmD  [facility]  [address], [place], [place] [address]  Phone: [phone]; Fax: [phone] 10/14/2016 7:29 AM

## 2016-10-13 NOTE — Patient Instructions (Addendum)
It was good seeing you today.  Your blood pressure is at goal today.  Continue the same medication regimen as prescribed.   If you experience any increased dizziness, lightheadedness, or weakness/fatigue, please contact the clinic at [phone].  Follow up in the clinic as needed.

## 2016-10-14 ENCOUNTER — Encounter: Payer: Self-pay | Admitting: Pharmacist

## 2016-11-30 ENCOUNTER — Encounter: Payer: Self-pay | Admitting: Cardiology

## 2016-12-05 ENCOUNTER — Encounter: Payer: Self-pay | Admitting: Cardiology

## 2016-12-05 ENCOUNTER — Ambulatory Visit (INDEPENDENT_AMBULATORY_CARE_PROVIDER_SITE_OTHER): Payer: Medicare PPO | Admitting: Cardiology

## 2016-12-05 VITALS — BP 120/82 | HR 54 | Ht 65.0 in | Wt 295.1 lb

## 2016-12-05 DIAGNOSIS — I420 Dilated cardiomyopathy: Secondary | ICD-10-CM | POA: Diagnosis not present

## 2016-12-05 DIAGNOSIS — I10 Essential (primary) hypertension: Secondary | ICD-10-CM | POA: Diagnosis not present

## 2016-12-05 DIAGNOSIS — G4733 Obstructive sleep apnea (adult) (pediatric): Secondary | ICD-10-CM

## 2016-12-05 DIAGNOSIS — I5022 Chronic systolic (congestive) heart failure: Secondary | ICD-10-CM

## 2016-12-05 NOTE — Patient Instructions (Signed)
Your physician recommends that you continue on your current medications as directed. Please refer to the Current Medication list given to you today.  Your physician recommends that you return for lab work today Designer, jewellery)  Your physician wants you to follow-up in: 6 months with Dr. Mayford Knife.  You will receive a reminder letter in the mail two months in advance. If you don't receive a letter, please call our office to schedule the follow-up appointment.

## 2016-12-05 NOTE — Progress Notes (Signed)
Cardiology Office Note:    Date:  12/05/2016   ID:  Judy [name], DOB [birth year], MRN [id number]  PCP:  Blane Ohara, MD  Cardiologist:  Armanda Magic, MD   Referring MD: Blane Ohara, MD   Chief Complaint  Patient presents with  . Cardiomyopathy  . Congestive Heart Failure    History of Present Illness:    Judy [name] is a 56 y.o. female with a hx of NICM, chronic combined CHF, COPD, former tobacco abuse, HTN, HLD, morbid obesity. Prior echo 12/2013 showed EF 45-50%. She had abnormal stress test 04/2014 resulting in cardiac cath 05/14/14 showing no significant CAD, EF 30%. Subsequent echo 08/2014 (poor quality) with EF approximately 35%. She was treated with medical therapy and subsequent cMRI 09/2014 showed diffuse HK, EF 28%, no infiltrative disease. Previous AICD recommended but she had been hesitant to pursue. Since that time, she has been following up regularly for med optimization. At last OV 05/10/16, her Sherryll Burger was increased further with recommendation to repeat cMRI in 2 months. Unfortunately she was unable to fit in the MRI scanner so the test was cancelled so f/u echo was obtained 07/29/16 showed EF approximately 45-50%, mild LVH, grade 2 DD, mild MR, mildly reduced RV function, PASP .  She is here today for followup and is doing well from a cardiac standpoint.  She denies any chest pain or pressure, PND, orthopnea, LE edema, abdominal fullness, dizziness, palpitations or syncope. She has chronic DOE which is stable and multifactorial from COPD, obesity and sedentary at home.  She is compliant with her meds and is tolerating meds with no SE.  She has moderate OSA with an AHI of 26/hr with oxygen desaturations as low as 70% and is on CPAP at 16cm H2O. She is doing well with her CPAP device and thinks that she has gotten used to it.  She tolerates the full face mask and feels the pressure is adequate.  Since going on CPAP she feels rested in the am and has no significant  daytime sleepiness.  She denies any significant mouth or nasal dryness or nasal congestion.  She does not think that he snores.  She is concerned that she may be swallowing air from her CPAP as she is belching a lot.  She saw the GI MD and nothing was found wrong.    Past Medical History:  Diagnosis Date  . Chronic combined systolic and diastolic CHF (congestive heart failure) (HCC) 10/07/2014  . COPD (chronic obstructive pulmonary disease) (HCC)   . DCM (dilated cardiomyopathy) (HCC) 04/25/2014   EF 28% by MRI despite maximum medical therapy  . Former tobacco use   . Hyperlipidemia   . Hypertension   . Morbid obesity (HCC)   . NICM (nonischemic cardiomyopathy) (HCC)   . OSA (obstructive sleep apnea)    moderate with AHI 26/hr with oxygen desaturations as low as 70%  . Scoliosis     Past Surgical History:  Procedure Laterality Date  . ABDOMINAL HYSTERECTOMY    . ankle artery involved cyst removal    . CARDIAC CATHETERIZATION  05/14/2014   normal coronary arteries  . CARPAL TUNNEL RELEASE    . FASCIOTOMY     1993 for platar fasciitis  . harrington rod scoliosis     1981  . LEFT HEART CATHETERIZATION WITH CORONARY ANGIOGRAM N/A 05/14/2014   Procedure: LEFT HEART CATHETERIZATION WITH CORONARY ANGIOGRAM;  Surgeon: Iran Ouch, MD;  Location: [facility];  Service: Cardiovascular;  Laterality: N/A;  .  ROTATOR CUFF REPAIR    . UMBILICAL HERNIA REPAIR    . VESICOVAGINAL FISTULA CLOSURE W/ TAH      Current Medications: Current Meds  Medication Sig  . aspirin 81 MG tablet Take 81 mg by mouth at bedtime.   Marland Kitchen. atorvastatin (LIPITOR) 20 MG tablet TAKE 1 TABLET BY MOUTH EVERY DAY AT 6 PM  . calcium citrate-vitamin D (CITRACAL+D) 315-200 MG-UNIT per tablet Take 1 tablet by mouth daily.  . carvedilol (COREG) 25 MG tablet TAKE 1 TABLET TWICE DAILY  . Cholecalciferol (VITAMIN D-3) 1000 UNITS CAPS Take 1 capsule by mouth daily.  . furosemide (LASIX) 20 MG tablet Take 1 tablet (20 mg  total) by mouth daily.  Marland Kitchen. gabapentin (NEURONTIN) 400 MG capsule Take 2 tablets by mouth in the AM and 2 by mouth in the PM  . levofloxacin (LEVAQUIN) 500 MG tablet Take 500 mg by mouth daily.  Marland Kitchen. levothyroxine (SYNTHROID, LEVOTHROID) 50 MCG tablet Take 50 mcg by mouth daily.  . Lutein-Zeaxanthin 25-5 MG CAPS Take 1 capsule by mouth 2 (two) times daily.   . Multiple Vitamin (MULTIVITAMIN WITH MINERALS) TABS tablet Take 1 tablet by mouth daily.  . predniSONE (DELTASONE) 20 MG tablet See admin instructions.  Marland Kitchen. Respiratory Therapy Supplies (NEBULIZER) DEVI by Does not apply route as directed.  . sacubitril-valsartan (ENTRESTO) 97-103 MG Take 1 tablet by mouth 2 (two) times daily.  Marland Kitchen. spironolactone (ALDACTONE) 25 MG tablet TAKE 1 TABLET EVERY DAY  . tiZANidine (ZANAFLEX) 4 MG tablet Take 4 mg by mouth daily.  Marland Kitchen. UNABLE TO FIND CPAP with o2 2lpm  DME- [facility]     Allergies:   Demerol [meperidine]   Social History   Social History  . Marital status: Married    Spouse name: N/A  . Number of children: N/A  . Years of education: N/A   Social History Main Topics  . Smoking status: Former Smoker    Packs/day: 1.00    Years: 15.00    Types: Cigarettes    Quit date: 02/14/2005  . Smokeless tobacco: Never Used  . Alcohol use Yes     Comment: occ  . Drug use: No  . Sexual activity: Not Asked   Other Topics Concern  . None   Social History Narrative   Lives in [place] with spouse and son.   Previously worked as a Comptrollerphysical therapist assistant              Family History: The patient's family history includes Allergies in her father; Emphysema in her father; Heart disease (age of onset: 6255) in her father; Heart failure in her father.  ROS:   Please see the history of present illness.    ROS  All other systems reviewed and negative.   EKGs/Labs/Other Studies Reviewed:    The following studies were reviewed today: none  EKG:  EKG is not ordered today.   Recent Labs: 07/12/2016:  ALT 39; Hemoglobin 13.4; Platelets 219 09/01/2016: BUN 14; Creatinine, Ser 0.75; Potassium 4.4; Sodium 144   Recent Lipid Panel    Component Value Date/Time   CHOL 146 07/12/2016 1115   TRIG 151 (H) 07/12/2016 1115   HDL 41 07/12/2016 1115   CHOLHDL 3.6 07/12/2016 1115   VLDL 30 07/12/2016 1115   LDLCALC 75 07/12/2016 1115    Physical Exam:    VS:  BP 120/82   Pulse (!) 54   Ht 5\' 5"  (1.651 m)   Wt 295 lb 1.9 oz (133.9 kg)  SpO2 96%   BMI 49.11 kg/m     Wt Readings from Last 3 Encounters:  12/05/16 295 lb 1.9 oz (133.9 kg)  08/03/16 289 lb 9.6 oz (131.4 kg)  05/10/16 290 lb 8 oz (131.8 kg)     GEN:  Well nourished, well developed in no acute distress HEENT: Normal NECK: No JVD; No carotid bruits LYMPHATICS: No lymphadenopathy CARDIAC: RRR, no murmurs, rubs, gallops RESPIRATORY:  Clear to auscultation without rales, wheezing or rhonchi  ABDOMEN: Soft, non-tender, non-distended MUSCULOSKELETAL:  No edema; No deformity  SKIN: Warm and dry NEUROLOGIC:  Alert and oriented x 3 PSYCHIATRIC:  Normal affect   ASSESSMENT:    1. Chronic systolic CHF (congestive heart failure), NYHA class 2 (HCC)   2. DCM (dilated cardiomyopathy) (HCC)   3. Essential hypertension, benign   4. OSA (obstructive sleep apnea)    PLAN:    In order of problems listed above:  1.  Chronic systolic CHF - she appears euvolemic on exam today with stable weight.  She will continue on Carvedilol, spironolactone, Lasix 20mg  daily and Entresto 97-103mg  BID.  I will check a BMET today.    2.  Nonischemic DCM - EF has improved on aggressive HF therapy with her most recent assessment of EF was by echo at 45-50% 07/2016.      3.  HTN - BP is well controlled on exam today. She will continue on Carvedilol 25mg  BID, spironolactone 25mg  daily.    4.  OSA - the patient is tolerating PAP therapy well without any problems. The PAP download was reviewed today and showed an AHI of 0.8/hr on 13 cm H2O with 96%  compliance in using more than 4 hours nightly.  The patient has been using and benefiting from CPAP use and will continue to benefit from therapy.  She is worried that she may be swallowing air but does not want to change her pressure at this time.     Medication Adjustments/Labs and Tests Ordered: Current medicines are reviewed at length with the patient today.  Concerns regarding medicines are outlined above.  No orders of the defined types were placed in this encounter.  No orders of the defined types were placed in this encounter.   Signed, Armanda Magic, MD  12/05/2016 1:50 PM    [facility]

## 2016-12-05 NOTE — Addendum Note (Signed)
Addended by: Lendon Ka on: 12/05/2016 02:06 PM   Modules accepted: Orders

## 2016-12-06 LAB — BASIC METABOLIC PANEL
BUN / CREAT RATIO: 21 (ref 9–23)
BUN: 13 mg/dL (ref 6–24)
CHLORIDE: 100 mmol/L (ref 96–106)
CO2: 24 mmol/L (ref 20–29)
Calcium: 9.1 mg/dL (ref 8.7–10.2)
Creatinine, Ser: 0.61 mg/dL (ref 0.57–1.00)
GFR calc non Af Amer: 102 mL/min/{1.73_m2} (ref >59)
GFR, EST AFRICAN AMERICAN: 117 mL/min/{1.73_m2} (ref >59)
GLUCOSE: 131 mg/dL — AB (ref 65–99)
Potassium: 4.4 mmol/L (ref 3.5–5.2)
SODIUM: 142 mmol/L (ref 134–144)

## 2016-12-13 ENCOUNTER — Telehealth: Payer: Self-pay | Admitting: *Deleted

## 2016-12-13 NOTE — Telephone Encounter (Signed)
-----   Message from Quintella Reichert, MD sent at 12/11/2016  5:28 PM EDT ----- Good AHI and compliance.  Continue current CPAP settings.

## 2016-12-13 NOTE — Telephone Encounter (Signed)
Informed patient of compliance results and verbalized understanding was indicated. Patient understands her sleep apnea events are in normal range at 0.8. Patient understands her compliance is good and she will continue on her current settings. Patient was grateful for the call and thanked me.

## 2017-04-24 ENCOUNTER — Other Ambulatory Visit: Payer: Self-pay | Admitting: Cardiology

## 2017-06-12 ENCOUNTER — Ambulatory Visit: Payer: Medicare PPO | Admitting: Cardiology

## 2017-06-12 VITALS — BP 124/68 | HR 68 | Ht 65.0 in | Wt 303.0 lb

## 2017-06-12 DIAGNOSIS — I10 Essential (primary) hypertension: Secondary | ICD-10-CM

## 2017-06-12 DIAGNOSIS — G4733 Obstructive sleep apnea (adult) (pediatric): Secondary | ICD-10-CM

## 2017-06-12 DIAGNOSIS — I420 Dilated cardiomyopathy: Secondary | ICD-10-CM | POA: Diagnosis not present

## 2017-06-12 DIAGNOSIS — I5022 Chronic systolic (congestive) heart failure: Secondary | ICD-10-CM | POA: Diagnosis not present

## 2017-06-12 DIAGNOSIS — R0602 Shortness of breath: Secondary | ICD-10-CM | POA: Diagnosis not present

## 2017-06-12 MED ORDER — FUROSEMIDE 20 MG PO TABS: 20.0000 mg | ORAL_TABLET | Freq: Every day | ORAL | 11 refills | Status: DC

## 2017-06-12 NOTE — Progress Notes (Addendum)
Cardiology Office Note:    Date:  06/12/2017   ID:  Judy [name], DOB 08-27-1960, MRN [id number]  PCP:  Blane Ohara, MD  Cardiologist:  No primary care provider on file.    Referring MD: Blane Ohara, MD   Chief Complaint  Patient presents with  . Cardiomyopathy  . Sleep Apnea  . Hypertension    History of Present Illness:    Judy [name] is a 57 y.o. female with a hx of  NICM, chronic combined CHF, OSA on PAP, COPD, former tobacco abuse, HTN, HLD, morbid obesity. Prior echo 12/2013 showed EF 45-50%. She had abnormal stress test 04/2014 resulting in cardiac cath 05/14/14 showing no significant CAD, EF 30%. Subsequent echo 08/2014 (poor quality) with EF approximately 35%. She was treated with medical therapy and subsequent cMRI 09/2014 showed diffuse HK, EF 28%, no infiltrative disease. Previous AICD recommended but she had been hesitant to pursue. Since that time, she has been following up regularly for med optimization. At last OV 05/10/16, her Sherryll Burger was increased further with recommendation to repeat cMRI in 2 months. Unfortunately she was unable to fit in the MRI scanner so the test was cancelled so f/u echo was obtained 07/29/16 showed EF approximately 45-50%, mild LVH, grade 2 DD, mild MR, mildly reduced RV function, PASP .  She is here today for followup.  She denies any chest pain or pressure,PND, orthopnea,  palpitations or syncope. She is compliant with her meds and is tolerating meds with no SE.  Her meds were recently adjusted by her PCP due to problems with hypotension and diuretics were stopped and carvedilol was decreased.  She has had more SOB in the past week with weight gain and increased abdominal girth and some mild LE edema. Since coming back from a cruise where she said the food was loaded with sodium.  She has some dizziness which sounds like vertigo with changes in head position.  She is doing well with her CPAP device.  She tolerates the mask and feels the  pressure is adequate.  Since going on CPAP she feels rested in the am and has no significant daytime sleepiness.  She denies any significant mouth or nasal dryness or nasal congestion.  She does not think that he snores.     Past Medical History:  Diagnosis Date  . Chronic combined systolic and diastolic CHF (congestive heart failure) (HCC) 10/07/2014  . COPD (chronic obstructive pulmonary disease) (HCC)   . DCM (dilated cardiomyopathy) (HCC) 04/25/2014   EF 28% by MRI despite maximum medical therapy  . Former tobacco use   . Hyperlipidemia   . Hypertension   . Morbid obesity (HCC)   . NICM (nonischemic cardiomyopathy) (HCC)   . OSA (obstructive sleep apnea)    moderate with AHI 26/hr with oxygen desaturations as low as 70%  . Scoliosis     Past Surgical History:  Procedure Laterality Date  . ABDOMINAL HYSTERECTOMY    . ankle artery involved cyst removal    . CARDIAC CATHETERIZATION  05/14/2014   normal coronary arteries  . CARPAL TUNNEL RELEASE    . FASCIOTOMY     1993 for platar fasciitis  . harrington rod scoliosis     1981  . LEFT HEART CATHETERIZATION WITH CORONARY ANGIOGRAM N/A 05/14/2014   Procedure: LEFT HEART CATHETERIZATION WITH CORONARY ANGIOGRAM;  Surgeon: Iran Ouch, MD;  Location: [facility];  Service: Cardiovascular;  Laterality: N/A;  . ROTATOR CUFF REPAIR    . UMBILICAL  HERNIA REPAIR    . VESICOVAGINAL FISTULA CLOSURE W/ TAH      Current Medications: Current Meds  Medication Sig  . aspirin 81 MG tablet Take 81 mg by mouth at bedtime.   Marland Kitchen atorvastatin (LIPITOR) 20 MG tablet TAKE 1 TABLET BY MOUTH EVERY DAY AT 6 PM  . carvedilol (COREG) 25 MG tablet TAKE 1 TABLET TWICE DAILY (Patient taking differently: TAKE 1 /2 TABLET TWICE DAILY)  . Cholecalciferol (VITAMIN D-3) 5000 units TABS Take 1 capsule by mouth daily.  Marland Kitchen gabapentin (NEURONTIN) 400 MG capsule Take 2 tablets by mouth in the AM and 2 by mouth in the PM  . levothyroxine (SYNTHROID, LEVOTHROID) 75  MCG tablet Take 75 mcg by mouth daily.  . Multiple Vitamin (MULTIVITAMIN WITH MINERALS) TABS tablet Take 1 tablet by mouth daily.  Marland Kitchen Respiratory Therapy Supplies (NEBULIZER) DEVI by Does not apply route as directed.  . sacubitril-valsartan (ENTRESTO) 97-103 MG Take 1 tablet by mouth 2 (two) times daily.  Marland Kitchen tiZANidine (ZANAFLEX) 4 MG tablet Take 4 mg by mouth daily.  Marland Kitchen UNABLE TO FIND CPAP with o2 2lpm  DME- [facility]  . [DISCONTINUED] levothyroxine (SYNTHROID, LEVOTHROID) 50 MCG tablet Take 50 mcg by mouth daily.     Allergies:   Demerol [meperidine]   Social History   Socioeconomic History  . Marital status: Married    Spouse name: Not on file  . Number of children: Not on file  . Years of education: Not on file  . Highest education level: Not on file  Occupational History  . Not on file  Social Needs  . Financial resource strain: Not on file  . Food insecurity:    Worry: Not on file    Inability: Not on file  . Transportation needs:    Medical: Not on file    Non-medical: Not on file  Tobacco Use  . Smoking status: Former Smoker    Packs/day: 1.00    Years: 15.00    Pack years: 15.00    Types: Cigarettes    Last attempt to quit: 02/14/2005    Years since quitting: 12.3  . Smokeless tobacco: Never Used  Substance and Sexual Activity  . Alcohol use: Yes    Comment: occ  . Drug use: No  . Sexual activity: Not on file  Lifestyle  . Physical activity:    Days per week: Not on file    Minutes per session: Not on file  . Stress: Not on file  Relationships  . Social connections:    Talks on phone: Not on file    Gets together: Not on file    Attends religious service: Not on file    Active member of club or organization: Not on file    Attends meetings of clubs or organizations: Not on file    Relationship status: Not on file  Other Topics Concern  . Not on file  Social History Narrative   Lives in [place] with spouse and son.   Previously worked as a Economist           Family History: The patient's family history includes Allergies in her father; Emphysema in her father; Heart disease (age of onset: 63) in her father; Heart failure in her father.  ROS:   Please see the history of present illness.    ROS  All other systems reviewed and negative.  EKG:  EKG is not ordered today.    Recent Labs: 07/12/2016: ALT 39;  Hemoglobin 13.4; Platelets 219 12/05/2016: BUN 13; Creatinine, Ser 0.61; Potassium 4.4; Sodium 142   Recent Lipid Panel    Component Value Date/Time   CHOL 146 07/12/2016 1115   TRIG 151 (H) 07/12/2016 1115   HDL 41 07/12/2016 1115   CHOLHDL 3.6 07/12/2016 1115   VLDL 30 07/12/2016 1115   LDLCALC 75 07/12/2016 1115    Physical Exam:    VS:  BP 124/68   Pulse 68   Ht 5\' 5"  (1.651 m)   Wt (!) 303 lb (137.4 kg)   BMI 50.42 kg/m     Wt Readings from Last 3 Encounters:  06/12/17 (!) 303 lb (137.4 kg)  12/05/16 295 lb 1.9 oz (133.9 kg)  08/03/16 289 lb 9.6 oz (131.4 kg)     GEN:  Well nourished, well developed in no acute distress HE: Normal NECK: No JVD; No carotid bruits LYMPHATICS: No lymphadenopathy CARDIAC: RRR, no murmurs, rubs, gallops RESPIRATORY:  Clear to auscultation without rales, wheezing or rhonchi  ABDOMEN: Soft, non-tender, non-distended MUSCULOSKELETAL:  No edema; No deformity  SKIN: Warm and dry NEUROLOGIC:  Alert and oriented x 3 PSYCHIATRIC:  Normal affect   ASSESSMENT:    1. DCM (dilated cardiomyopathy) (HCC)   2. Chronic systolic CHF (congestive heart failure), NYHA class 2 (HCC)   3. Essential hypertension, benign   4. OSA (obstructive sleep apnea)   5. Severe obesity (BMI >= 40) (HCC)    PLAN:    In order of problems listed above:  1.  DCM - nonischemic with no CAD on cath.  Last EF was 45-55% by echo 07/2016  2.  Chronic systolic CHF NYHA class 2 - she appears euvolemic on exam today.  Weight is up 8lbs but cannot tell how much is fluid and how much is from  being on a cruise last week and eating more than normally.  She has had problems with more SOB than usual which could be due to weight gain or volume overload.  Her PCP had stopped her diuretics due to hypotension.  I will check a BMET, TSH, CBC and BNP and start lasix 20mg  daily.  She will continue on carvedilol 12.5mg  BID and Entresto 97-103mg  BID.  Her creatinine was stable at 0.68 on 04/28/2017. I will repeat a 2D echo to make sure LVF is still intact and also get PFTs with DLCO to see if she has a degree of restrictive lung disease from her obesity contributing to her SOB as she has decreased air movement with breathing.  I suspect that some of her SOB is due to deconditioning as she is very sedentary and uses a mobile device for ambulation.  3.  HTN - BP is well controlled on exam today.  She will continue on carvedilol 12.5mg  BID and Entresto 97-103mg  BID.    4.  OSA - the patient is tolerating PAP therapy well without any problems. The PAP download was reviewed today and showed an AHI of 1/hr on 13 cm H2O with 100% compliance in using more than 4 hours nightly.  The patient has been using and benefiting from PAP use and will continue to benefit from therapy.    5.  Severe Obesity - she is not able to exercise due to needing to use a mobile device for ambulation.    Medication Adjustments/Labs and Tests Ordered: Current medicines are reviewed at length with the patient today.  Concerns regarding medicines are outlined above.  No orders of the defined types were placed  in this encounter.  No orders of the defined types were placed in this encounter.   Signed, Armanda Magic, MD  06/12/2017 11:39 AM    [facility]

## 2017-06-12 NOTE — Patient Instructions (Signed)
Medication Instructions:  Your physician has recommended you make the following change in your medication:  START: Lasix 20 mg once a day   If you need a refill on your cardiac medications, please contact your pharmacy first.  Labwork: Today for kidney function test, complete blood count, thyroid, and BNP   Testing/Procedures: Your physician has requested that you have an echocardiogram. Echocardiography is a painless test that uses sound waves to create images of your heart. It provides your doctor with information about the size and shape of your heart and how well your heart's chambers and valves are working. This procedure takes approximately one hour. There are no restrictions for this procedure.  Your physician has recommended that you have a pulmonary function test. Pulmonary Function Tests are a group of tests that measure how well air moves in and out of your lungs.   Follow-Up: Your physician wants you to follow-up in: 6 months with Dr. Mayford Knife. You will receive a reminder letter in the mail two months in advance. If you don't receive a letter, please call our office to schedule the follow-up appointment.  Any Other Special Instructions Will Be Listed Below (If Applicable).   Thank you for choosing [facility]    Lyda Perone, RN  [phone]  If you need a refill on your cardiac medications before your next appointment, please call your pharmacy.

## 2017-06-13 ENCOUNTER — Telehealth: Payer: Self-pay

## 2017-06-13 LAB — CBC
HEMATOCRIT: 42.1 % (ref 34.0–46.6)
Hemoglobin: 13.5 g/dL (ref 11.1–15.9)
MCH: 30.6 pg (ref 26.6–33.0)
MCHC: 32.1 g/dL (ref 31.5–35.7)
MCV: 96 fL (ref 79–97)
Platelets: 216 10*3/uL (ref 150–379)
RBC: 4.41 x10E6/uL (ref 3.77–5.28)
RDW: 13.5 % (ref 12.3–15.4)
WBC: 6.5 10*3/uL (ref 3.4–10.8)

## 2017-06-13 LAB — BASIC METABOLIC PANEL
BUN / CREAT RATIO: 17 (ref 9–23)
BUN: 10 mg/dL (ref 6–24)
CO2: 28 mmol/L (ref 20–29)
CREATININE: 0.6 mg/dL (ref 0.57–1.00)
Calcium: 10 mg/dL (ref 8.7–10.2)
Chloride: 105 mmol/L (ref 96–106)
GFR calc non Af Amer: 102 mL/min/{1.73_m2} (ref >59)
GFR, EST AFRICAN AMERICAN: 118 mL/min/{1.73_m2} (ref >59)
Glucose: 136 mg/dL — ABNORMAL HIGH (ref 65–99)
Potassium: 4.8 mmol/L (ref 3.5–5.2)
Sodium: 147 mmol/L — ABNORMAL HIGH (ref 134–144)

## 2017-06-13 LAB — PRO B NATRIURETIC PEPTIDE: NT-Pro BNP: 46 pg/mL (ref 0–287)

## 2017-06-13 LAB — TSH: TSH: 2.11 u[IU]/mL (ref 0.450–4.500)

## 2017-06-13 MED ORDER — FUROSEMIDE 20 MG PO TABS: 20.0000 mg | ORAL_TABLET | ORAL | 3 refills | Status: DC | PRN

## 2017-06-13 NOTE — Telephone Encounter (Signed)
Notes recorded by Phineas Semen, RN on 06/13/2017 at 1:17 PM EDT Patient inquiring on if she should continue with restarting lasix since lab results are stable. Per Dr. Mayford Knife okay to take Lasix as needed for LE swelling. I instructed patient to take Lasix 20 mg once a day as needed if she has LE swelling. She is in agreement with treatment plan and thankful for the call.

## 2017-06-19 ENCOUNTER — Ambulatory Visit (HOSPITAL_COMMUNITY): Payer: Medicare PPO | Attending: Cardiovascular Disease

## 2017-06-19 ENCOUNTER — Other Ambulatory Visit: Payer: Self-pay

## 2017-06-19 ENCOUNTER — Telehealth: Payer: Self-pay | Admitting: Cardiology

## 2017-06-19 DIAGNOSIS — R0602 Shortness of breath: Secondary | ICD-10-CM | POA: Diagnosis present

## 2017-06-19 DIAGNOSIS — I420 Dilated cardiomyopathy: Secondary | ICD-10-CM | POA: Diagnosis not present

## 2017-06-19 DIAGNOSIS — E785 Hyperlipidemia, unspecified: Secondary | ICD-10-CM | POA: Diagnosis not present

## 2017-06-19 DIAGNOSIS — I110 Hypertensive heart disease with heart failure: Secondary | ICD-10-CM | POA: Insufficient documentation

## 2017-06-19 DIAGNOSIS — I313 Pericardial effusion (noninflammatory): Secondary | ICD-10-CM | POA: Insufficient documentation

## 2017-06-19 DIAGNOSIS — G4733 Obstructive sleep apnea (adult) (pediatric): Secondary | ICD-10-CM | POA: Diagnosis not present

## 2017-06-19 DIAGNOSIS — I509 Heart failure, unspecified: Secondary | ICD-10-CM | POA: Insufficient documentation

## 2017-06-19 DIAGNOSIS — R0600 Dyspnea, unspecified: Secondary | ICD-10-CM | POA: Insufficient documentation

## 2017-06-19 MED ORDER — PERFLUTREN LIPID MICROSPHERE
1.0000 mL | INTRAVENOUS | Status: AC | PRN
  Administered 2017-06-19: 2 mL via INTRAVENOUS

## 2017-06-19 NOTE — Telephone Encounter (Signed)
Walk In pt Form-papers dropped off. Placed in Dr.Turner doc box

## 2017-06-20 ENCOUNTER — Ambulatory Visit (INDEPENDENT_AMBULATORY_CARE_PROVIDER_SITE_OTHER): Payer: Medicare PPO | Admitting: Internal Medicine

## 2017-06-20 DIAGNOSIS — R0602 Shortness of breath: Secondary | ICD-10-CM | POA: Diagnosis not present

## 2017-06-20 LAB — PULMONARY FUNCTION TEST
DL/VA % PRED: 75 %
DL/VA: 3.65 ml/min/mmHg/L
DLCO COR: 17.64 ml/min/mmHg
DLCO cor % pred: 70 %
DLCO unc % pred: 70 %
DLCO unc: 17.7 ml/min/mmHg
FEF 25-75 POST: 0.63 L/s
FEF 25-75 Pre: 0.38 L/sec
FEF2575-%Change-Post: 68 %
FEF2575-%PRED-PRE: 14 %
FEF2575-%Pred-Post: 25 %
FEV1-%Change-Post: 19 %
FEV1-%PRED-PRE: 34 %
FEV1-%Pred-Post: 41 %
FEV1-POST: 1.1 L
FEV1-PRE: 0.93 L
FEV1FVC-%Change-Post: 0 %
FEV1FVC-%PRED-PRE: 58 %
FEV6-%Change-Post: 19 %
FEV6-%PRED-POST: 68 %
FEV6-%Pred-Pre: 56 %
FEV6-POST: 2.27 L
FEV6-Pre: 1.9 L
FEV6FVC-%CHANGE-POST: -1 %
FEV6FVC-%PRED-POST: 98 %
FEV6FVC-%Pred-Pre: 99 %
FVC-%CHANGE-POST: 20 %
FVC-%PRED-POST: 69 %
FVC-%Pred-Pre: 57 %
FVC-POST: 2.39 L
FVC-Pre: 1.99 L
PRE FEV1/FVC RATIO: 46 %
Post FEV1/FVC ratio: 46 %
Post FEV6/FVC ratio: 95 %
Pre FEV6/FVC Ratio: 97 %
RV % PRED: 184 %
RV: 3.6 L
TLC % PRED: 123 %
TLC: 6.32 L

## 2017-06-20 NOTE — Progress Notes (Signed)
Patient completed full PFT today. 

## 2017-07-05 MED FILL — Perflutren Lipid Microsphere IV Susp 6.52 MG/ML: INTRAVENOUS | Qty: 2 | Status: AC

## 2017-07-11 ENCOUNTER — Telehealth: Payer: Self-pay

## 2017-07-11 NOTE — Telephone Encounter (Signed)
We received an [place] PA request from the pts pharmacy, [facility]. Prior to doing the PA I did a Patient Benefit Verification through covermymeds and received the following message:  Patient Benefit Verification  Good news! [employer] has indicated Zeplynn Schrieber is covered for this medication, and no prior authorization is needed.   I then called [facility] and s/w Lorin Picket who advised me that it looks like an [place] PA is not needed at this time. Per Lorin Picket the pt has a $275 deductible to meet then her cost will go down to $131 for a 90 day supply which is the normal cost so a PA is not required.

## 2017-07-18 ENCOUNTER — Ambulatory Visit: Payer: Medicare PPO | Admitting: Internal Medicine

## 2017-07-18 ENCOUNTER — Encounter: Payer: Self-pay | Admitting: Internal Medicine

## 2017-07-18 ENCOUNTER — Ambulatory Visit (INDEPENDENT_AMBULATORY_CARE_PROVIDER_SITE_OTHER)
Admission: RE | Admit: 2017-07-18 | Discharge: 2017-07-18 | Disposition: A | Discharge status: Home / Self Care | Payer: Medicare PPO | Source: Ambulatory Visit | Attending: Internal Medicine | Admitting: Internal Medicine

## 2017-07-18 VITALS — BP 138/80 | HR 100 | Ht 65.0 in | Wt 301.2 lb

## 2017-07-18 DIAGNOSIS — J449 Chronic obstructive pulmonary disease, unspecified: Secondary | ICD-10-CM | POA: Diagnosis not present

## 2017-07-18 DIAGNOSIS — J9611 Chronic respiratory failure with hypoxia: Secondary | ICD-10-CM

## 2017-07-18 MED ORDER — TIOTROPIUM BROMIDE-OLODATEROL 2.5-2.5 MCG/ACT IN AERS: 2.0000 | INHALATION_SPRAY | Freq: Every day | RESPIRATORY_TRACT | 11 refills | Status: DC

## 2017-07-18 NOTE — Progress Notes (Signed)
Subjective:     Patient ID: Judy [name], female   DOB: 03/12/1960,  MRN: [id number]    Brief patient profile:  57 yowf quit smoking 2007  @  150 lb  with cough that resolved and no resp problems until winter 2015 with doe x walking the dog s much in terms of cough then 10/04/13 sudden sense she couldn't get a breath and coughed up blood x one tsp plus slt green mucus  > better with saba and started on inhalers/ abx  >  better and pred taper and dx of GOLD II copd was made 11/2013 and non ischemic cardiomyopathy 05/14/14.   History of Present Illness  10/08/2013 1st [facility] office visit/ Judy [name] Chief Complaint  Patient presents with  . Pulmonary Consult    Referred by Judy [name] with exertion x 6-8 mths.,worse since 4 days ago,occass. cough-tsp. of blood 4 days ago,usually unprod.,no wheezing,midchest tightness,no fcs,Had neb. since yesterday(used 2x yesterday)   baseline = 158ft   to mailbox and sob when gets there.  rec Clonidine 0.1 mg twice daily until you return  Plan A = automatic = symbiocort 160 Take 2 puffs first thing in am and then another 2 puffs about 12 hours later.  Plan B = Only use your albuterol (proair)as a rescue medication    Plan C = nebulizer albuterol, ok to use up to every 4 hours if can't get relief from Plan B GERD diet     LHC 05/14/14  1. No significant coronary artery disease 2. Moderate to severely reduced LV systolic function due to nonischemic cardiomyopathy.  3. Moderately elevated left ventricular end-diastolic pressure.           07/18/2017 acute extended ov/Judy [name] re: re-establish for sob  Chief Complaint  Patient presents with  . Acute Visit    Increased SOB x 6-8 wks.  She states she is getting SOB just walking from room to room at home.   All started p abruptly became  light headed about 6 wk prior to OV  > bp was low > adjustments made to bp meds (decrease lasix and coreg) then sob worse s cough and now doe = MMRC4  = sob if  tries to leave home or while getting dressed some better when wears 02 but not monitoring daytime sats   02 is 2lpm with cpap and prn daytime  no noct symtpoms on cpap flat > feels rested   No obvious day to day or daytime variability or assoc excess/ purulent sputum or mucus plugs or hemoptysis or cp or chest tightness, subjective wheeze or overt sinus or hb symptoms. No unusual exposure hx or h/o childhood pna/ asthma or knowledge of premature birth.  Sleeping  Ok on cpap   without nocturnal  or early am exacerbation  of respiratory  c/o's or need for noct saba. Also denies any obvious fluctuation of symptoms with weather or environmental changes or other aggravating or alleviating factors except as outlined above   Current Allergies, Complete Past Medical History, Past Surgical History, Family History, and Social History were reviewed in Judy [name] record.  ROS  The following are not active complaints unless bolded Hoarseness, sore throat, dysphagia, dental problems, itching, sneezing,  nasal congestion or discharge of excess mucus or purulent secretions, ear ache,   fever, chills, sweats, unintended wt loss or wt gain, classically pleuritic or exertional cp,  orthopnea pnd or arm/hand swelling  or leg swelling, presyncope, palpitations, abdominal pain, anorexia,  nausea, vomiting, diarrhea  or change in bowel habits or change in bladder habits, change in stools or change in urine, dysuria, hematuria,  rash, arthralgias, visual complaints, headache, numbness, weakness or ataxia or problems with walking or coordination,  change in mood or  memory.        Current Meds  Medication Sig  . aspirin 81 MG tablet Take 81 mg by mouth at bedtime.   Marland Kitchen atorvastatin (LIPITOR) 20 MG tablet TAKE 1 TABLET BY MOUTH EVERY DAY AT 6 PM  . carvedilol (COREG) 25 MG tablet Take 12.5 mg by mouth 2 (two) times daily with a meal.  . Cholecalciferol (VITAMIN D-3) 5000 units TABS Take 1 capsule by  mouth daily.  . furosemide (LASIX) 20 MG tablet Take 1 tablet (20 mg total) by mouth as needed for edema.  . gabapentin (NEURONTIN) 400 MG capsule Take 2 tablets by mouth in the AM and 2 by mouth in the PM  . levothyroxine (SYNTHROID, LEVOTHROID) 75 MCG tablet Take 75 mcg by mouth daily.  . Multiple Vitamin (MULTIVITAMIN WITH MINERALS) TABS tablet Take 1 tablet by mouth daily.  . OXYGEN 2lpm 2 lpm as needed  . sacubitril-valsartan (ENTRESTO) 97-103 MG Take 1 tablet by mouth 2 (two) times daily.  Marland Kitchen tiZANidine (ZANAFLEX) 4 MG tablet Take 4 mg by mouth daily.  Marland Kitchen UNABLE TO FIND CPAP with o2 2lpm  DME- [facility]  .   carvedilol (COREG) 25 MG tablet TAKE 1 TABLET TWICE DAILY (Patient taking differently: TAKE 1 /2 TABLET TWICE DAILY)                                    Objective:   Physical Exam  Obese wf nad    11/27/2013     237 >   01/08/2014  236 > 01/21/2014 241 >235 02/21/2014 > 02/26/2014  232 > 03/27/2014  236 >236 04/10/2014 > 05/22/2014   237 >  08/21/2014 245 > 10/09/2014 257 >  01/12/2015   268 > 07/14/2015  278 > 01/14/2016  292  > 07/18/2017  301   Vital signs reviewed - Note on arrival 02 sats  94% on RA        HEENT: nl dentition, turbinates bilaterally, and oropharynx. Nl external ear canals without cough reflex   NECK :  without JVD/Nodes/TM/ nl carotid upstrokes bilaterally   LUNGS: no acc muscle use,  Nl contour chest which is clear to A and P bilaterally without cough on insp or exp maneuvers   CV:  RRR  no s3 or murmur or increase in P2, and  Trace   Pitting edema  both feet  ABD:  Massively obese soft and nontender with poor inspiratory excursion . No bruits or organomegaly appreciated, bowel sounds nl  MS:  Nl gait/ ext warm without deformities, calf tenderness, cyanosis or clubbing No obvious joint restrictions   SKIN: warm and dry without lesions    NEURO:  alert, approp, nl sensorium with  no motor or cerebellar deficits apparent.        CXR PA and  Lateral:   07/18/2017 :    I personally reviewed images and agree with radiology impression as follows:    No edema or consolidation.      Labs  reviewed:      Chemistry      Component Value Date/Time   NA 147 (H) 06/12/2017 1209   K 4.8  06/12/2017 1209   CL 105 06/12/2017 1209   CO2 28 06/12/2017 1209   BUN 10 06/12/2017 1209   CREATININE 0.60 06/12/2017 1209   CREATININE 0.75 12/09/2015 1057      Component Value Date/Time   CALCIUM 10.0 06/12/2017 1209   ALKPHOS 166 (H) 07/12/2016 1115   AST 28 07/12/2016 1115   ALT 39 07/12/2016 1115   BILITOT 1.0 07/12/2016 1115        Lab Results  Component Value Date   WBC 6.5 06/12/2017   HGB 13.5 06/12/2017   HCT 42.1 06/12/2017   MCV 96 06/12/2017   PLT 216 06/12/2017         Lab Results  Component Value Date   TSH 2.110 06/12/2017     Lab Results  Component Value Date   PROBNP 46 06/12/2017                 Assessment:

## 2017-07-18 NOTE — Patient Instructions (Addendum)
Goal is to keep 02 sats over 90% at all times so take it with activity if needed and turn 02 up to achieve this goal   Continue lasix as needed to control feet swelling   Please see patient coordinator before you leave today  to schedule overnight  Oxymetry on 02 an cpap  Please remember to go to the  x-ray department downstairs in the basement  for your tests - we will call you with the results when they are available.      Please schedule a follow up office visit in 6 weeks, call sooner if needed  - add stiolto 2 pffs each am

## 2017-07-19 NOTE — Progress Notes (Signed)
Spoke with pt and notified of results per Dr. Wert. Pt verbalized understanding and denied any questions. 

## 2017-07-20 ENCOUNTER — Telehealth: Payer: Self-pay | Admitting: Internal Medicine

## 2017-07-20 NOTE — Telephone Encounter (Signed)
Per MW- ONO on CPAP plus 2lpm o2 (done by [facility] on 07/12/17) was normal  Spoke with pt and notified of results per Dr. Sherene Sires. Pt verbalized understanding and denied any questions.

## 2017-07-21 ENCOUNTER — Encounter: Payer: Self-pay | Admitting: Internal Medicine

## 2017-07-21 NOTE — Assessment & Plan Note (Signed)
- 11/27/2013  PFTs   FEV1  1.51 (54%) ratio 52 and and no better p B2 and DLCO  71 - 01/08/2014  p extensive coaching HFA effectiveness =    90% > rec resume symbicort 160 2bid  - 03/27/14 trial of dulera 200/tudorza samples only to assure adherence  04/10/2014   Alpha 1 MM , nml level  - trial off spiriva 05/22/14 / off all resp rx 08/22/14 - PFT's  10/09/2014  FEV1 1.64 (59 % ) ratio 54  p 11 % improvement from saba with DLCO  69 % corrects to 73  % for alv volume   - 07/14/2015  A  > try spiriva 2 puffs each am > no better so pt d/c'd  - PFT's  07/18/2017  FEV1 1.10 (41 % ) ratio 46   p 19 % improvement from saba p nothing prior to study with DLCO  70 % corrects to 75  % for alv volume  On coreg  - 07/18/2017  After extensive coaching inhaler device  effectiveness =    75% with SMI > try stiolto   DDX of  difficult airways management almost all start with A and  include Adherence, Ace Inhibitors, Acid Reflux, Active Sinus Disease, Alpha 1 Antitripsin deficiency, Anxiety masquerading as Airways dz,  ABPA,  Allergy(esp in young), Aspiration (esp in elderly), Adverse effects of meds,  Active smokers, A bunch of PE's (a small clot burden can't cause this syndrome unless there is already severe underlying pulm or vascular dz with poor reserve) plus two Bs  = Bronchiectasis and Beta blocker use..and one C= CHF  Adherence is always the initial "prime suspect" and is a multilayered concern that requires a "trust but verify" approach in every patient - starting with knowing how to use medications, especially inhalers, correctly, keeping up with refills and understanding the fundamental difference between maintenance and prns vs those medications only taken for a very short course and then stopped and not refilled.  - return with all meds in hand using a trust but verify approach to confirm accurate Medication  Reconciliation The principal here is that until we are certain that the  patients are doing what we've asked,  it makes no sense to ask them to do more.  - see hfa teaching   ? Adverse drug effects >  She has worsened on higher doses of entresto and lower doses of coreg but this may not be cause and effect  ? Anxiety/depression/deconditioning  > usually at the bottom of this list of usual suspects but should be   higher on this pt's based on H and P and   may interfere with adherence and also interpretation of response or lack thereof to symptom management which can be quite subjective.   ? BB effects > In the setting of respiratory symptoms of unknown etiology,  It would be preferable to use bystolic, the most beta -1  selective Beta blocker available in sample form, with bisoprolol the most selective generic choice  on the market, at least on a trial basis, to make sure the spillover Beta 2 effects of the less specific Beta blockers are not contributing to this patient's symptoms.   Would strongly consider trial of bisoprolol over coreg here as the chf component is doing better   ? chf > much better clincally and by cxr    I had an extended discussion with the patient reviewing all relevant studies completed to date and  lasting 25  minutes of a 40  minute acute offic  visit to re-establish     re  severe non-specific but potentially very serious refractory respiratory symptoms of uncertain and potentially multiple  etiologies.   Each maintenance medication was reviewed in detail including most importantly the difference between maintenance and prns and under what circumstances the prns are to be triggered using an action plan format that is not reflected in the computer generated alphabetically organized AVS.    Please see AVS for specific instructions unique to this office visit that I personally wrote and verbalized to the the pt in detail and then reviewed with pt  by my nurse highlighting any changes in therapy/plan of care  recommended at today's visit.

## 2017-07-21 NOTE — Assessment & Plan Note (Addendum)
Walk with desats 88% 04/10/2014 >begin O2 w/ act 2 l/m  -07/14/2015  Walked RA x 3 laps @ 185 ft each stopped due to end of study, nl pace, no significant desat or sob. > hs 02 only  - ono on 2lpm/cpap  07/20/2017   desats < 89% x 2.4 min  Goal is to keep above 90% reasonable noct study and ok to titrate daytime with activity

## 2017-07-21 NOTE — Assessment & Plan Note (Signed)
Body mass index is 50.12 kg/m.  -  trending up  Lab Results  Component Value Date   TSH 2.110 06/12/2017     Contributing to gerd risk/ doe/reviewed the need and the process to achieve and maintain neg calorie balance > defer f/u primary care including intermittently monitoring thyroid status

## 2017-07-31 ENCOUNTER — Other Ambulatory Visit: Payer: Self-pay

## 2017-07-31 ENCOUNTER — Telehealth: Payer: Self-pay | Admitting: Internal Medicine

## 2017-07-31 MED ORDER — CARVEDILOL 12.5 MG PO TABS: 12.5000 mg | ORAL_TABLET | Freq: Two times a day (BID) | ORAL | 3 refills | Status: DC

## 2017-07-31 MED ORDER — TIOTROPIUM BROMIDE-OLODATEROL 2.5-2.5 MCG/ACT IN AERS: 2.0000 | INHALATION_SPRAY | Freq: Every day | RESPIRATORY_TRACT | 11 refills | Status: DC

## 2017-07-31 NOTE — Telephone Encounter (Signed)
Duplicate message. 

## 2017-07-31 NOTE — Telephone Encounter (Signed)
They are leaving to go out of town early [place], so need this tonight or tomorrow.

## 2017-07-31 NOTE — Telephone Encounter (Signed)
Spoke with pt's husband, Richard. Pt is needing Stiolto to be sent to [facility] on [address] in [place]. Rx has been sent in. Nothing further was needed.

## 2017-08-29 ENCOUNTER — Encounter: Payer: Self-pay | Admitting: Internal Medicine

## 2017-08-29 ENCOUNTER — Ambulatory Visit: Payer: Medicare PPO | Admitting: Internal Medicine

## 2017-08-29 VITALS — BP 124/82 | HR 85 | Ht 65.0 in | Wt 303.0 lb

## 2017-08-29 DIAGNOSIS — R05 Cough: Secondary | ICD-10-CM | POA: Diagnosis not present

## 2017-08-29 DIAGNOSIS — J449 Chronic obstructive pulmonary disease, unspecified: Secondary | ICD-10-CM

## 2017-08-29 DIAGNOSIS — J9611 Chronic respiratory failure with hypoxia: Secondary | ICD-10-CM | POA: Diagnosis not present

## 2017-08-29 DIAGNOSIS — R059 Cough, unspecified: Secondary | ICD-10-CM

## 2017-08-29 MED ORDER — TIOTROPIUM BROMIDE-OLODATEROL 2.5-2.5 MCG/ACT IN AERS: 2.0000 | INHALATION_SPRAY | Freq: Every day | RESPIRATORY_TRACT | 0 refills | Status: DC

## 2017-08-29 NOTE — Assessment & Plan Note (Signed)
-   11/27/2013  PFTs   FEV1  1.51 (54%) ratio 52 and and no better p B2 and DLCO  71 - 01/08/2014  p extensive coaching HFA effectiveness =    90% > rec resume symbicort 160 2bid  - 03/27/14 trial of dulera 200/tudorza samples only to assure adherence  04/10/2014   Alpha 1 MM , nml level  - trial off spiriva 05/22/14 / off all resp rx 08/22/14 - PFT's  10/09/2014  FEV1 1.64 (59 % ) ratio 54  p 11 % improvement from saba with DLCO  69 % corrects to 73  % for alv volume   - 07/14/2015  A  > try spiriva 2 puffs each am > no better so pt d/c'd  - PFT's  07/18/2017  FEV1 1.10 (41 % ) ratio 46   p 19 % improvement from saba p nothing prior to study with DLCO  70 % corrects to 75  % for alv volume  On coreg  - 07/18/2017    try stiolto   - 08/29/2017  After extensive coaching inhaler device  effectiveness =    75% with smi (short Ti)    Pt is Group B in terms of symptom/risk and laba/lama therefore appropriate rx at this point > continue stiolto 2 pffs each am    I had an extended discussion with the patient reviewing all relevant studies completed to date and  lasting 15 to 20 minutes of a 25 minute visit    See device teaching which extended face to face time for this visit.  Each maintenance medication was reviewed in detail including emphasizing most importantly the difference between maintenance and prns and under what circumstances the prns are to be triggered using an action plan format that is not reflected in the computer generated alphabetically organized AVS which I have not found useful in most complex patients, especially with respiratory illnesses  Please see AVS for specific instructions unique to this visit that I personally wrote and verbalized to the the pt in detail and then reviewed with pt  by my nurse highlighting any  changes in therapy recommended at today's visit to their plan of care.

## 2017-08-29 NOTE — Progress Notes (Signed)
Subjective:     Patient ID: Judy [name], female   DOB: 1960/06/09,  MRN: [id number]    Brief patient profile:  57 yowf quit smoking 2007  @  150 lb  with cough that resolved and no resp problems until winter 2015 with doe x walking the dog s much in terms of cough then 10/04/13 sudden sense she couldn't get a breath and coughed up blood x one tsp plus slt green mucus  > [name] with saba and started on inhalers/ abx  >  [name] and pred taper and dx of GOLD II copd was made 11/2013 and non ischemic cardiomyopathy 05/14/14.   History of Present Illness  10/08/2013 1st [facility] office visit/ Wert Chief Complaint  Patient presents with  . Pulmonary Consult    Referred by Penni Homans Cox-sob with exertion x 6-8 mths.,worse since 4 days ago,occass. cough-tsp. of blood 4 days ago,usually unprod.,no wheezing,midchest tightness,no fcs,Had neb. since yesterday(used 2x yesterday)   baseline = 114ft   to mailbox and sob when gets there.  rec Clonidine 0.1 mg twice daily until you return  Plan A = automatic = symbiocort 160 Take 2 puffs first thing in am and then another 2 puffs about 12 hours later.  Plan B = Only use your albuterol (proair)as a rescue medication    Plan C = nebulizer albuterol, ok to use up to every 4 hours if can't get relief from Plan B GERD diet     LHC 05/14/14  1. No significant coronary artery disease 2. Moderate to severely reduced LV systolic function due to nonischemic cardiomyopathy.  3. Moderately elevated left ventricular end-diastolic pressure.    07/18/2017 acute extended ov/Wert re: re-establish for sob  Chief Complaint  Patient presents with  . Acute Visit    Increased SOB x 6-8 wks.  She states she is getting SOB just walking from room to room at home.   All started p abruptly became  light headed about 6 wk prior to OV  > bp was low > adjustments made to bp meds (decrease lasix and coreg) then sob worse s cough and now doe = MMRC4  = sob if tries to  leave home or while getting dressed some [name] when wears 02 but not monitoring daytime sats   02 is 2lpm with cpap and prn daytime  no noct symtpoms on cpap flat > feels rested rec Goal is to keep 02 sats over 90% at all times so take it with activity if needed and turn 02 up to achieve this goal  Continue lasix as needed to control feet swelling  Please see patient coordinator before you leave today  to schedule overnight  Oxymetry on 02 an cpap Please remember to go to the  x-ray department downstairs in the basement  for your tests - we will call you with the results when they are available.  Please schedule a follow up office visit in 6 weeks, call sooner if needed  - add stiolto 2 pffs each am     08/29/2017  f/u ov/Wert re:  GOLD III/ 02 dep hs with cpap and  ? With exertion  Chief Complaint  Patient presents with  . Follow-up    Breathing is some [name] but not back to baseline.  She has had sore throat and increased cough for the past few days. Cough has been non prod.    Dyspnea:  MMRC3 = some [name] but still  can't walk 100 yards even at  a slow pace at a flat grade s stopping due to sob but admits not good about checking sats when walking    Cough: dry day > noct new x sev months assoc with hoarseness not post nasal drip/ wheeze   SABA use: none 02: 2lpm at hs and prn daytime   No obvious day to day or daytime variability or assoc excess/ purulent sputum or mucus plugs or hemoptysis or cp or chest tightness, subjective wheeze or overt sinus or hb symptoms.   Sleeping: cpap and 2lpm  without nocturnal  or early am exacerbation  of respiratory  c/o's or need for noct saba. Also denies any obvious fluctuation of symptoms with weather or environmental changes or other aggravating or alleviating factors except as outlined above   No unusual exposure hx or h/o childhood pna/ asthma or knowledge of premature birth.  Current Allergies, Complete Past Medical History, Past Surgical  History, Family History, and Social History were reviewed in [employer] [facility] record.  ROS  The following are not active complaints unless bolded Hoarseness, sore throat, dysphagia, dental problems, itching, sneezing,  nasal congestion or discharge of excess mucus or purulent secretions, ear ache,   fever, chills, sweats, unintended wt loss or wt gain, classically pleuritic or exertional cp,  orthopnea pnd or arm/hand swelling  or leg swelling, presyncope, palpitations, abdominal pain, anorexia, nausea, vomiting, diarrhea  or change in bowel habits or change in bladder habits, change in stools or change in urine, dysuria, hematuria,  rash, arthralgias, visual complaints, headache, numbness, weakness or ataxia or problems with walking or coordination,  change in mood or  memory.        Current Meds  Medication Sig  . aspirin 81 MG tablet Take 81 mg by mouth at bedtime.   Marland Kitchen atorvastatin (LIPITOR) 20 MG tablet TAKE 1 TABLET BY MOUTH EVERY DAY AT 6 PM  . carvedilol (COREG) 12.5 MG tablet Take 1 tablet (12.5 mg total) by mouth 2 (two) times daily with a meal.  . Cholecalciferol (VITAMIN D-3) 5000 units TABS Take 1 capsule by mouth daily.  . furosemide (LASIX) 20 MG tablet Take 1 tablet (20 mg total) by mouth as needed for edema.  . gabapentin (NEURONTIN) 400 MG capsule Take 2 tablets by mouth in the AM and 2 by mouth in the PM  . levothyroxine (SYNTHROID, LEVOTHROID) 75 MCG tablet Take 75 mcg by mouth daily.  . Multiple Vitamin (MULTIVITAMIN WITH MINERALS) TABS tablet Take 1 tablet by mouth daily.  . OXYGEN 2lpm 2 lpm as needed  . sacubitril-valsartan (ENTRESTO) 97-103 MG Take 1 tablet by mouth 2 (two) times daily.  . Tiotropium Bromide-Olodaterol (STIOLTO RESPIMAT) 2.5-2.5 MCG/ACT AERS Inhale 2 puffs into the lungs daily.  Marland Kitchen tiZANidine (ZANAFLEX) 4 MG tablet Take 4 mg by mouth daily.  . traMADol (ULTRAM) 50 MG tablet Take 1 tablet by mouth every 6 (six) hours as needed.  Marland Kitchen UNABLE  TO FIND CPAP with o2 2lpm  DME- [facility]                    Objective:   Physical Exam  Obese pleasant wf minimal hoarse, chewing mint gum/ using rollator no 02   11/27/2013     237 >   01/08/2014  236 > 01/21/2014 241 >235 02/21/2014 > 02/26/2014  232 > 03/27/2014  236 >236 04/10/2014 > 05/22/2014   237 >  08/21/2014 245 > 10/09/2014 257 >  01/12/2015   268 > 07/14/2015  278 > 01/14/2016  292  > 07/18/2017   301  > 08/29/2017  303   Vital signs reviewed - Note on arrival 02 sats  91% on RA       HEENT: nl dentition, turbinates bilaterally, and oropharynx. Nl external ear canals without cough reflex   NECK :  without JVD/Nodes/TM/ nl carotid upstrokes bilaterally   LUNGS: no acc muscle use,  Nl contour chest which is clear to A and P bilaterally without cough on insp or exp maneuvers   CV:  RRR  no s3 or murmur or increase in P2, and trace pitting edema L > R ankle/feet  ABD:  Quite obese but soft and nontender with limited  inspiratory excursion in the supine position. No bruits or organomegaly appreciated, bowel sounds nl  MS:  Nl gait/ ext warm without deformities, calf tenderness, cyanosis or clubbing No obvious joint restrictions   SKIN: warm and dry without lesions    NEURO:  alert, approp, nl sensorium with  no motor or cerebellar deficits apparent.         CXR PA and Lateral:   07/18/2017 :    I personally reviewed images and agree with radiology impression as follows:    No edema or consolidation.                      Assessment:

## 2017-08-29 NOTE — Assessment & Plan Note (Addendum)
Walk with desats 88% 04/10/2014 >begin O2 w/ act 2 l/m  -07/14/2015  Walked RA x 3 laps @ 185 ft each stopped due to end of study, nl pace, no significant desat or sob. > hs 02 only  - ono on 2lpm/cpap  07/20/2017   desats < 89% x 2.4 min  ? Adequate control on present rx, reviewed in detail with pt > sats ok 2lp at hs with cpap  and rest RA   but needs to titrate 02 to keep sats > 90% with ex for cardiac oxygenation and to help get her into neg cal bal (see sep a/p for MO)

## 2017-08-29 NOTE — Assessment & Plan Note (Addendum)
Most likely this is Upper airway cough syndrome (previously labeled PNDS),  is so named because it's frequently impossible to sort out how much is  CR/sinusitis with freq throat clearing (which can be related to primary GERD)   vs  causing  secondary (" extra esophageal")  GERD from wide swings in gastric pressure that occur with throat clearing, often  promoting self use of mint and menthol lozenges that reduce the lower esophageal sphincter tone and exacerbate the problem further in a cyclical fashion.   These are the same pts (now being labeled as having "irritable larynx syndrome" by some cough centers) who not infrequently have a history of having failed to tolerate ace inhibitors and sometimes entresto, which may be a concern also but for now ok,  dry powder inhalers or biphosphonates or report having atypical/extraesophageal reflux symptoms that don't respond to standard doses of PPI  and are easily confused as having aecopd or asthma flares by even experienced allergists/ pulmonologists (myself included).    Try gerd rx/ diet first and stop all mint products then f/u in 4 weeks if not better, o/w see q 3 m

## 2017-08-29 NOTE — Assessment & Plan Note (Signed)
Body mass index is 50.42 kg/m.  -  trending up slightly  Lab Results  Component Value Date   TSH 2.110 06/12/2017     Contributing to gerd risk/ doe/reviewed the need and the process to achieve and maintain neg calorie balance > defer f/u primary care including intermittently monitoring thyroid status

## 2017-08-29 NOTE — Patient Instructions (Addendum)
Try prilosec otc 20mg   Take 30-60 min before first meal of the day and Pepcid ac (famotidine) 20 mg one @  bedtime until cough is completely gone for at least a week without the need for cough suppression     GERD (REFLUX)  is an extremely common cause of respiratory symptoms just like yours , many times with no obvious heartburn at all.    It can be treated with medication, but also with lifestyle changes including elevation of the head of your bed (ideally with 6 inch  bed blocks),  Smoking cessation, avoidance of late meals, excessive alcohol, and avoid fatty foods, chocolate, peppermint, colas, red wine, and acidic juices such as orange juice.  NO MINT OR MENTHOL PRODUCTS SO NO COUGH DROPS   USE SUGARLESS CANDY INSTEAD (Jolley ranchers or Stover's or Life Savers) or even ice chips will also do - the key is to swallow to prevent all throat clearing. NO OIL BASED VITAMINS - use powdered substitutes.    Adjust 02 to sats > 90% at all times especially while walking to help you burn fat and get into negative balance   Please schedule a follow up visit in 3 months but call sooner if needed  - spirometry on return

## 2017-11-30 ENCOUNTER — Ambulatory Visit: Payer: Medicare PPO | Admitting: Internal Medicine

## 2017-12-04 ENCOUNTER — Ambulatory Visit: Payer: Medicare PPO | Admitting: Internal Medicine

## 2017-12-04 ENCOUNTER — Encounter: Payer: Self-pay | Admitting: Internal Medicine

## 2017-12-04 VITALS — BP 130/70 | HR 91 | Ht 65.0 in | Wt 298.6 lb

## 2017-12-04 DIAGNOSIS — J9611 Chronic respiratory failure with hypoxia: Secondary | ICD-10-CM

## 2017-12-04 DIAGNOSIS — J449 Chronic obstructive pulmonary disease, unspecified: Secondary | ICD-10-CM

## 2017-12-04 DIAGNOSIS — G4733 Obstructive sleep apnea (adult) (pediatric): Secondary | ICD-10-CM

## 2017-12-04 MED ORDER — TIOTROPIUM BROMIDE-OLODATEROL 2.5-2.5 MCG/ACT IN AERS: 2.0000 | INHALATION_SPRAY | Freq: Every day | RESPIRATORY_TRACT | 0 refills | Status: DC

## 2017-12-04 NOTE — Patient Instructions (Addendum)
Work on maintaining perfect  inhaler technique:  relax and gently blow all the way out then take a nice smooth deep breath back in, triggering the inhaler at same time you start breathing in.  Hold for up to 5 seconds if you can. Rinse and gargle with water when done   No change in medications  Adjust your 02 upwards as needed to maintain 02 sats well above 90% while exercising    Please schedule a follow up visit in 6 months but call sooner if needed

## 2017-12-04 NOTE — Progress Notes (Signed)
Subjective:     Patient ID: Judy [name], female   DOB: [birth year],  MRN: [id number]    Brief patient profile:  57 yowf quit smoking 2007  @  150 lb  with cough that resolved and no resp problems until winter 2015 with doe x walking the dog s much in terms of cough then 10/04/13 sudden sense she couldn't get a breath and coughed up blood x one tsp plus slt green mucus  > better with saba and started on inhalers/ abx  >  better and pred taper and dx of GOLD II copd was made 11/2013 and non ischemic cardiomyopathy 05/14/14.     History of Present Illness  10/08/2013 1st [facility] office visit/ Azora Bonzo Chief Complaint  Patient presents with  . Pulmonary Consult    Referred by Penni Homans Cox-sob with exertion x 6-8 mths.,worse since 4 days ago,occass. cough-tsp. of blood 4 days ago,usually unprod.,no wheezing,midchest tightness,no fcs,Had neb. since yesterday(used 2x yesterday)   baseline = 175ft   to mailbox and sob when gets there.  rec Clonidine 0.1 mg twice daily until you return  Plan A = automatic = symbiocort 160 Take 2 puffs first thing in am and then another 2 puffs about 12 hours later.  Plan B = Only use your albuterol (proair)as a rescue medication    Plan C = nebulizer albuterol, ok to use up to every 4 hours if can't get relief from Plan B GERD diet     LHC 05/14/14  1. No significant coronary artery disease 2. Moderate to severely reduced LV systolic function due to nonischemic cardiomyopathy.  3. Moderately elevated left ventricular end-diastolic pressure.    07/18/2017 acute extended ov/Duane Earnshaw re: re-establish for sob  Chief Complaint  Patient presents with  . Acute Visit    Increased SOB x 6-8 wks.  She states she is getting SOB just walking from room to room at home.   All started p abruptly became  light headed about 6 wk prior to OV  > bp was low > adjustments made to bp meds (decrease lasix and coreg) then sob worse s cough and now doe = MMRC4  = sob if tries  to leave home or while getting dressed some better when wears 02 but not monitoring daytime sats   02 is 2lpm with cpap and prn daytime  no noct symtpoms on cpap flat > feels rested rec Goal is to keep 02 sats over 90% at all times so take it with activity if needed and turn 02 up to achieve this goal  Continue lasix as needed to control feet swelling  Please see patient coordinator before you leave today  to schedule overnight  Oxymetry on 02 an cpap Please remember to go to the  x-ray department downstairs in the basement  for your tests - we will call you with the results when they are available.  Please schedule a follow up office visit in 6 weeks, call sooner if needed  - add stiolto 2 pffs each am     08/29/2017  f/u ov/Rodric Punch re:  GOLD III/ 02 dep hs with cpap and  ? With exertion  Chief Complaint  Patient presents with  . Follow-up    Breathing is some better but not back to baseline.  She has had sore throat and increased cough for the past few days. Cough has been non prod.    Dyspnea:  MMRC3 = some better but still  can't walk 100 yards  even at a slow pace at a flat grade s stopping due to sob but admits not good about checking sats when walking    Cough: dry day > noct new x sev months assoc with hoarseness not post nasal drip/ wheeze  SABA use: none 02: 2lpm at hs and prn daytime  rec Try prilosec otc 20mg   Take 30-60 min before first meal of the day and Pepcid ac (famotidine) 20 mg one @  bedtime until cough is completely gone for at least a week   Adjust 02 to sats > 90% at all times especially while walking to help you burn fat and get into negative balance      12/04/2017  f/u ov/Caton Popowski re:  GOLD III / 02 dep hs at 2lpm and 2lpm with ex bike Chief Complaint  Patient presents with  . Follow-up    COPD GOLD III  Dyspnea: e bike  3 x weekly x 10 min s stopping and w/in 5 min needs 02 if not starting out with it  Cough: resolved  Sleeping: bed is flat/ L side down/ one  pillow/ cpap per turner  SABA use: none on stiolto 2 each am  02: as above     No obvious day to day or daytime variability or assoc excess/ purulent sputum or mucus plugs or hemoptysis or cp or chest tightness, subjective wheeze or overt sinus or hb symptoms.   Sleeping as above without nocturnal  or early am exacerbation  of respiratory  c/o's or need for noct saba. Also denies any obvious fluctuation of symptoms with weather or environmental changes or other aggravating or alleviating factors except as outlined above   No unusual exposure hx or h/o childhood pna/ asthma or knowledge of premature birth.  Current Allergies, Complete Past Medical History, Past Surgical History, Family History, and Social History were reviewed in [employer] record.  ROS  The following are not active complaints unless bolded Hoarseness, sore throat, dysphagia, dental problems, itching, sneezing,  nasal congestion or discharge of excess mucus or purulent secretions, ear ache,   fever, chills, sweats, unintended wt loss or wt gain, classically pleuritic or exertional cp,  orthopnea pnd or arm/hand swelling  or leg swelling, presyncope, palpitations, abdominal pain, anorexia, nausea, vomiting, diarrhea  or change in bowel habits or change in bladder habits, change in stools or change in urine, dysuria, hematuria,  rash, arthralgias, visual complaints, headache, numbness, weakness or ataxia or problems with walking or coordination,  change in mood or  memory.        Current Meds  Medication Sig  . aspirin 81 MG tablet Take 81 mg by mouth at bedtime.   Marland Kitchen atorvastatin (LIPITOR) 20 MG tablet TAKE 1 TABLET BY MOUTH EVERY DAY AT 6 PM  . carvedilol (COREG) 12.5 MG tablet Take 1 tablet (12.5 mg total) by mouth 2 (two) times daily with a meal.  . Cholecalciferol (VITAMIN D-3) 5000 units TABS Take 1 capsule by mouth daily.  . furosemide (LASIX) 20 MG tablet Take 1 tablet (20 mg total) by mouth as  needed for edema.  . gabapentin (NEURONTIN) 400 MG capsule Take 2 tablets by mouth in the AM and 2 by mouth in the PM  . levothyroxine (SYNTHROID, LEVOTHROID) 75 MCG tablet Take 75 mcg by mouth daily.  . Multiple Vitamin (MULTIVITAMIN WITH MINERALS) TABS tablet Take 1 tablet by mouth daily.  . OXYGEN 2lpm 2 lpm as needed  . sacubitril-valsartan (ENTRESTO) 97-103 MG Take  1 tablet by mouth 2 (two) times daily.  . Tiotropium Bromide-Olodaterol (STIOLTO RESPIMAT) 2.5-2.5 MCG/ACT AERS Inhale 2 puffs into the lungs daily.  Marland Kitchen tiZANidine (ZANAFLEX) 4 MG tablet Take 4 mg by mouth daily.  . traMADol (ULTRAM) 50 MG tablet Take 1 tablet by mouth every 6 (six) hours as needed.  Marland Kitchen UNABLE TO FIND CPAP with o2 2lpm  DME- [facility]                  Objective:   Physical Exam  amb obese wf using walking due to back  11/27/2013     237 >   01/08/2014  236 > 01/21/2014 241 >235 02/21/2014 > 02/26/2014  232 > 03/27/2014  236 >236 04/10/2014 > 05/22/2014   237 >  08/21/2014 245 > 10/09/2014 257 >  01/12/2015   268 > 07/14/2015  278 > 01/14/2016  292  > 07/18/2017   301  > 08/29/2017  303 > 12/04/2017  298   Vital signs reviewed - Note on arrival 02 sats  96% on RA     HEENT: nl dentition, turbinates bilaterally, and oropharynx. Nl external ear canals without cough reflex   NECK :  without JVD/Nodes/TM/ nl carotid upstrokes bilaterally   LUNGS: no acc muscle use,  Nl contour chest with distant  bs s wheeze/ No cough on insp or exp maneuvers   CV:  RRR  no s3 or murmur or increase in P2, and no edema   ABD:  Quite obese soft and nontender with limited inspiratory excursion. No bruits or organomegaly appreciated, bowel sounds nl  MS:  Nl gait/ ext warm without deformities, calf tenderness, cyanosis or clubbing No obvious joint restrictions   SKIN: warm and dry without lesions    NEURO:  alert, approp, nl sensorium with  no motor or cerebellar deficits apparent.         Assessment:

## 2017-12-05 ENCOUNTER — Encounter: Payer: Self-pay | Admitting: Internal Medicine

## 2017-12-05 NOTE — Assessment & Plan Note (Addendum)
-   11/27/2013  PFTs   FEV1  1.51 (54%) ratio 52 and and no better p B2 and DLCO  71 - 01/08/2014  p extensive coaching HFA effectiveness =    90% > rec resume symbicort 160 2bid  - 03/27/14 trial of dulera 200/tudorza samples only to assure adherence  04/10/2014   Alpha 1 MM , nml level  - trial off spiriva 05/22/14 / off all resp rx 08/22/14 - PFT's  10/09/2014  FEV1 1.64 (59 % ) ratio 54  p 11 % improvement from saba with DLCO  69 % corrects to 73  % for alv volume   - 07/14/2015  A  > try spiriva 2 puffs each am > no better so pt d/c'd  - PFT's  07/18/2017  FEV1 1.10 (41 % ) ratio 46   p 19 % improvement from saba p nothing prior to study with DLCO  70 % corrects to 75  % for alv volume  On coreg  - 07/18/2017    try stiolto    - Spirometry 12/04/2017  FEV1 1.3 (49%)  Ratio 49 p am stiolto x 2 puffs  w classic curvature   Pt is Group B in terms of symptom/risk and laba/lama therefore appropriate rx at this point > continue stiolto  - The proper method of use, as well as anticipated side effects, of a metered-dose inhaler are discussed and demonstrated to the patient.  90% effective smi > continue with respimat device

## 2017-12-05 NOTE — Assessment & Plan Note (Signed)
rx per Dr Mayford Knife > appears to be improving on rx     I had an extended discussion with the patient reviewing all relevant studies completed to date and  lasting 15 to 20 minutes of a 25 minute visit    See device teaching which extended face to face time for this visit.  Each maintenance medication was reviewed in detail including emphasizing most importantly the difference between maintenance and prns and under what circumstances the prns are to be triggered using an action plan format that is not reflected in the computer generated alphabetically organized AVS which I have not found useful in most complex patients, especially with respiratory illnesses  Please see AVS for specific instructions unique to this visit that I personally wrote and verbalized to the the pt in detail and then reviewed with pt  by my nurse highlighting any  changes in therapy recommended at today's visit to their plan of care.

## 2017-12-05 NOTE — Assessment & Plan Note (Signed)
Body mass index is 49.69 kg/m.  -  trending down slightly, encouraged Lab Results  Component Value Date   TSH 2.110 06/12/2017     Contributing to gerd risk/ doe/reviewed the need and the process to achieve and maintain neg calorie balance > defer f/u primary care including intermittently monitoring thyroid status

## 2017-12-05 NOTE — Assessment & Plan Note (Signed)
Walk with desats 88% 04/10/2014 >begin O2 w/ act 2 l/m  -07/14/2015  Walked RA x 3 laps @ 185 ft each stopped due to end of study, nl pace, no significant desat or sob. > hs 02 only  - ono on 2lpm/cpap  07/20/2017   desats < 89% x 2.4 min   Adequate control on present rx, reviewed in detail with pt > no change in rx needed    Advised to put 02 on at the start of ex bike and try to build up to 30 min daily by reducing resistance until able to last 30 m s stopping

## 2017-12-11 ENCOUNTER — Ambulatory Visit: Payer: Medicare PPO | Admitting: Cardiology

## 2017-12-11 ENCOUNTER — Encounter: Payer: Self-pay | Admitting: Cardiology

## 2017-12-11 VITALS — BP 116/76 | HR 72 | Ht 65.0 in | Wt 298.1 lb

## 2017-12-11 DIAGNOSIS — I10 Essential (primary) hypertension: Secondary | ICD-10-CM

## 2017-12-11 DIAGNOSIS — G4733 Obstructive sleep apnea (adult) (pediatric): Secondary | ICD-10-CM

## 2017-12-11 DIAGNOSIS — I5022 Chronic systolic (congestive) heart failure: Secondary | ICD-10-CM | POA: Diagnosis not present

## 2017-12-11 DIAGNOSIS — I420 Dilated cardiomyopathy: Secondary | ICD-10-CM | POA: Diagnosis not present

## 2017-12-11 NOTE — Progress Notes (Signed)
Cardiology Office Note:    Date:  12/11/2017   ID:  Judy [name], DOB 1960/11/27, MRN [id number]  PCP:  Blane Ohara, MD  Cardiologist:  No primary care provider on file.    Referring MD: Blane Ohara, MD   Chief Complaint  Patient presents with  . Sleep Apnea  . Hypertension  . Congestive Heart Failure    History of Present Illness:    Judy [name] is a 57 y.o. female with a hx of NICM, chronic combined CHF, OSA on PAP, COPD, former tobacco abuse, HTN, HLD, morbid obesity. Prior echo 12/2013 showed EF 45-50%. She had abnormal stress test 04/2014 resulting in cardiac cath 05/14/14 showing no significant CAD, EF 30%. Subsequent echo 08/2014 (poor quality) with EF approximately 35%. She was treated with medical therapy and subsequent cMRI 09/2014 showed diffuse HK, EF 28%, no infiltrative disease. Previous AICD recommended but she had been hesitant to pursue. Since that time, she has been following up regularly for med optimization. At last OV 05/10/16, her Sherryll Burger was increased further with recommendation to repeat cMRI in 2 months. Unfortunately she was unable to fit in the MRI scanner so the test was cancelled so f/u echo was obtained 07/29/16 showed EF approximately 45-50%, mild LVH, grade 2 DD, mild MR, mildly reduced RV function, PASP .  Repeat echo 06/19/2017 showed normalization of LV function with EF 55%.  She is here today for followup and is doing well.  She denies any chest pain or pressure, \ PND, orthopnea, LE edema, dizziness, palpitations or syncope.  She has chronic dyspnea on exertion like COPD which is unchanged actually has improved on inhalers.  She is compliant with her meds and is tolerating meds with no SE.  She is doing well with her CPAP device and thinks that she has gotten used to it.  She tolerates the mask and feels the pressure is adequate.  Since going on CPAP she feels rested in the am and has no significant daytime sleepiness.  She denies any significant  mouth or nasal dryness or nasal congestion.  She does not think that he snores.     Past Medical History:  Diagnosis Date  . Chronic combined systolic and diastolic CHF (congestive heart failure) (HCC) 10/07/2014  . COPD (chronic obstructive pulmonary disease) (HCC)   . DCM (dilated cardiomyopathy) (HCC) 04/25/2014   EF 28% by MRI despite maximum medical therapy  . Former tobacco use   . Hyperlipidemia   . Hypertension   . Morbid obesity (HCC)   . NICM (nonischemic cardiomyopathy) (HCC)   . OSA (obstructive sleep apnea)    moderate with AHI 26/hr with oxygen desaturations as low as 70%  . Scoliosis     Past Surgical History:  Procedure Laterality Date  . ABDOMINAL HYSTERECTOMY    . ankle artery involved cyst removal    . CARDIAC CATHETERIZATION  05/14/2014   normal coronary arteries  . CARPAL TUNNEL RELEASE    . FASCIOTOMY     1993 for platar fasciitis  . harrington rod scoliosis     1981  . LEFT HEART CATHETERIZATION WITH CORONARY ANGIOGRAM N/A 05/14/2014   Procedure: LEFT HEART CATHETERIZATION WITH CORONARY ANGIOGRAM;  Surgeon: Iran Ouch, MD;  Location: [facility]ATH [facility];  Service: Cardiovascular;  Laterality: N/A;  . ROTATOR CUFF REPAIR    . UMBILICAL HERNIA REPAIR    . VESICOVAGINAL FISTULA CLOSURE W/ TAH      Current Medications: Current Meds  Medication Sig  .  aspirin 81 MG tablet Take 81 mg by mouth at bedtime.   Marland Kitchen atorvastatin (LIPITOR) 20 MG tablet TAKE 1 TABLET BY MOUTH EVERY DAY AT 6 PM  . carvedilol (COREG) 12.5 MG tablet Take 1 tablet (12.5 mg total) by mouth 2 (two) times daily with a meal.  . cephALEXin (KEFLEX) 500 MG capsule Take 500 mg by mouth 2 (two) times daily.  . Cholecalciferol (VITAMIN D-3) 5000 units TABS Take 1 capsule by mouth daily.  . furosemide (LASIX) 20 MG tablet Take 1 tablet (20 mg total) by mouth as needed for edema.  . gabapentin (NEURONTIN) 400 MG capsule Take 2 tablets by mouth in the AM and 2 by mouth in the PM  . levothyroxine  (SYNTHROID, LEVOTHROID) 75 MCG tablet Take 75 mcg by mouth daily.  . Multiple Vitamin (MULTIVITAMIN WITH MINERALS) TABS tablet Take 1 tablet by mouth daily.  . OXYGEN 2lpm 2 lpm as needed  . sacubitril-valsartan (ENTRESTO) 97-103 MG Take 1 tablet by mouth 2 (two) times daily.  . Tiotropium Bromide-Olodaterol (STIOLTO RESPIMAT) 2.5-2.5 MCG/ACT AERS Inhale 2 puffs into the lungs daily.  Marland Kitchen tiZANidine (ZANAFLEX) 4 MG tablet Take 4 mg by mouth daily.  . traMADol (ULTRAM) 50 MG tablet Take 1 tablet by mouth every 6 (six) hours as needed.  Marland Kitchen UNABLE TO FIND CPAP with o2 2lpm  DME- [facility]     Allergies:   Demerol [meperidine]   Social History   Socioeconomic History  . Marital status: Married    Spouse name: Not on file  . Number of children: Not on file  . Years of education: Not on file  . Highest education level: Not on file  Occupational History  . Not on file  Social Needs  . Financial resource strain: Not on file  . Food insecurity:    Worry: Not on file    Inability: Not on file  . Transportation needs:    Medical: Not on file    Non-medical: Not on file  Tobacco Use  . Smoking status: Former Smoker    Packs/day: 1.00    Years: 15.00    Pack years: 15.00    Types: Cigarettes    Last attempt to quit: 02/14/2005    Years since quitting: 12.8  . Smokeless tobacco: Never Used  Substance and Sexual Activity  . Alcohol use: Yes    Comment: occ  . Drug use: No  . Sexual activity: Not on file  Lifestyle  . Physical activity:    Days per week: Not on file    Minutes per session: Not on file  . Stress: Not on file  Relationships  . Social connections:    Talks on phone: Not on file    Gets together: Not on file    Attends religious service: Not on file    Active member of club or organization: Not on file    Attends meetings of clubs or organizations: Not on file    Relationship status: Not on file  Other Topics Concern  . Not on file  Social History Narrative   Lives in  [place] with spouse and son.   Previously worked as a Comptroller           Family History: The patient's family history includes Allergies in her father; Emphysema in her father; Heart disease (age of onset: 39) in her father; Heart failure in her father.  ROS:   Please see the history of present illness.  ROS  All other systems reviewed and negative.   EKGs/Labs/Other Studies Reviewed:    The following studies were reviewed today: PAP download  EKG:  EKG is ordered today and showed normal sinus rhythm with first-degree AV block septal infarct.  Recent Labs: 06/12/2017: BUN 10; Creatinine, Ser 0.60; Hemoglobin 13.5; NT-Pro BNP 46; Platelets 216; Potassium 4.8; Sodium 147; TSH 2.110   Recent Lipid Panel    Component Value Date/Time   CHOL 146 07/12/2016 1115   TRIG 151 (H) 07/12/2016 1115   HDL 41 07/12/2016 1115   CHOLHDL 3.6 07/12/2016 1115   VLDL 30 07/12/2016 1115   LDLCALC 75 07/12/2016 1115    Physical Exam:    VS:  BP 116/76   Pulse 72   Ht 5\' 5"  (1.651 m)   Wt 298 lb 1.9 oz (135.2 kg)   SpO2 97%   BMI 49.61 kg/m     Wt Readings from Last 3 Encounters:  12/11/17 298 lb 1.9 oz (135.2 kg)  12/04/17 298 lb 9.6 oz (135.4 kg)  08/29/17 (!) 303 lb (137.4 kg)     GEN:  Well nourished, well developed in no acute distress HEENT: Normal NECK: No JVD; No carotid bruits LYMPHATICS: No lymphadenopathy CARDIAC: RRR, no murmurs, rubs, gallops RESPIRATORY:  Clear to auscultation without rales, wheezing or rhonchi  ABDOMEN: Soft, non-tender, non-distended MUSCULOSKELETAL:  No edema; No deformity  SKIN: Warm and dry NEUROLOGIC:  Alert and oriented x 3 PSYCHIATRIC:  Normal affect   ASSESSMENT:    1. Chronic systolic CHF (congestive heart failure), NYHA class 2 (HCC)   2. DCM (dilated cardiomyopathy) (HCC)   3. Essential hypertension, benign   4. OSA (obstructive sleep apnea)   5. Morbid obesity due to excess calories (HCC)    PLAN:     In order of problems listed above:  1.  Chronic diastolic CHF NYHA Class 2 - she appears euvolemic on exam today.  Weight stable.  She will continue on Entresto, carvedilol and Lasix 20 mg as needed for edema.  She has not had to use Lasix as long time.  Creatinine was 0.83 and potassium 4.3 on 10/10/2017.  2.  DCM - 2D echo 06/19/2016 showed normal LV function with EF 55% and no wall motion normalities.    3.  Hypertension - BP is controlled on exam today.  She will continue on Entresto 97-103 mg twice daily twice daily and carvedilol 12.5 mg twice daily.  4.  OSA - the patient is tolerating PAP therapy well without any problems. The PAP download was reviewed today and showed an AHI of 0.8/hr on 13 cm H2O with 100% compliance in using more than 4 hours nightly.  The patient has been using and benefiting from PAP use and will continue to benefit from therapy.   5.  Morbid obesity - I have encouraged her to cut back on carbs and portions.    Medication Adjustments/Labs and Tests Ordered: Current medicines are reviewed at length with the patient today.  Concerns regarding medicines are outlined above.  No orders of the defined types were placed in this encounter.  No orders of the defined types were placed in this encounter.   Signed, Armanda Magic, MD  12/11/2017 11:21 AM    [facility]

## 2017-12-11 NOTE — Patient Instructions (Signed)

## 2018-01-10 ENCOUNTER — Other Ambulatory Visit: Payer: Self-pay | Admitting: Cardiology

## 2018-02-15 DIAGNOSIS — N3001 Acute cystitis with hematuria: Secondary | ICD-10-CM | POA: Diagnosis not present

## 2018-02-15 DIAGNOSIS — R310 Gross hematuria: Secondary | ICD-10-CM | POA: Diagnosis not present

## 2018-03-06 DIAGNOSIS — N959 Unspecified menopausal and perimenopausal disorder: Secondary | ICD-10-CM | POA: Diagnosis not present

## 2018-03-06 DIAGNOSIS — M85852 Other specified disorders of bone density and structure, left thigh: Secondary | ICD-10-CM | POA: Diagnosis not present

## 2018-03-06 DIAGNOSIS — M8580 Other specified disorders of bone density and structure, unspecified site: Secondary | ICD-10-CM | POA: Diagnosis not present

## 2018-03-07 ENCOUNTER — Other Ambulatory Visit: Payer: Self-pay | Admitting: Internal Medicine

## 2018-03-07 MED ORDER — TIOTROPIUM BROMIDE-OLODATEROL 2.5-2.5 MCG/ACT IN AERS: 2.0000 | INHALATION_SPRAY | Freq: Every day | RESPIRATORY_TRACT | 1 refills | Status: DC

## 2018-03-11 DIAGNOSIS — J449 Chronic obstructive pulmonary disease, unspecified: Secondary | ICD-10-CM | POA: Diagnosis not present

## 2018-03-12 ENCOUNTER — Other Ambulatory Visit: Payer: Self-pay | Admitting: Internal Medicine

## 2018-03-12 MED ORDER — TIOTROPIUM BROMIDE-OLODATEROL 2.5-2.5 MCG/ACT IN AERS: 2.0000 | INHALATION_SPRAY | Freq: Every day | RESPIRATORY_TRACT | 1 refills | Status: DC

## 2018-04-02 DIAGNOSIS — E1169 Type 2 diabetes mellitus with other specified complication: Secondary | ICD-10-CM | POA: Diagnosis not present

## 2018-04-02 DIAGNOSIS — E034 Atrophy of thyroid (acquired): Secondary | ICD-10-CM | POA: Diagnosis not present

## 2018-04-02 DIAGNOSIS — I1310 Hypertensive heart and chronic kidney disease without heart failure, with stage 1 through stage 4 chronic kidney disease, or unspecified chronic kidney disease: Secondary | ICD-10-CM | POA: Diagnosis not present

## 2018-04-02 DIAGNOSIS — E782 Mixed hyperlipidemia: Secondary | ICD-10-CM | POA: Diagnosis not present

## 2018-04-04 DIAGNOSIS — G4733 Obstructive sleep apnea (adult) (pediatric): Secondary | ICD-10-CM | POA: Diagnosis not present

## 2018-04-05 DIAGNOSIS — I5022 Chronic systolic (congestive) heart failure: Secondary | ICD-10-CM | POA: Diagnosis not present

## 2018-04-05 DIAGNOSIS — M545 Low back pain: Secondary | ICD-10-CM | POA: Diagnosis not present

## 2018-04-05 DIAGNOSIS — Z6841 Body Mass Index (BMI) 40.0 and over, adult: Secondary | ICD-10-CM | POA: Diagnosis not present

## 2018-04-05 DIAGNOSIS — G4733 Obstructive sleep apnea (adult) (pediatric): Secondary | ICD-10-CM | POA: Diagnosis not present

## 2018-04-05 DIAGNOSIS — E038 Other specified hypothyroidism: Secondary | ICD-10-CM | POA: Diagnosis not present

## 2018-04-05 DIAGNOSIS — E782 Mixed hyperlipidemia: Secondary | ICD-10-CM | POA: Diagnosis not present

## 2018-04-05 DIAGNOSIS — E1142 Type 2 diabetes mellitus with diabetic polyneuropathy: Secondary | ICD-10-CM | POA: Diagnosis not present

## 2018-04-05 DIAGNOSIS — I110 Hypertensive heart disease with heart failure: Secondary | ICD-10-CM | POA: Diagnosis not present

## 2018-04-09 DIAGNOSIS — R809 Proteinuria, unspecified: Secondary | ICD-10-CM | POA: Diagnosis not present

## 2018-04-11 DIAGNOSIS — J449 Chronic obstructive pulmonary disease, unspecified: Secondary | ICD-10-CM | POA: Diagnosis not present

## 2018-05-10 DIAGNOSIS — J449 Chronic obstructive pulmonary disease, unspecified: Secondary | ICD-10-CM | POA: Diagnosis not present

## 2018-05-16 ENCOUNTER — Encounter: Payer: Self-pay | Admitting: General Surgery

## 2018-05-16 ENCOUNTER — Other Ambulatory Visit: Payer: Self-pay | Admitting: General Surgery

## 2018-05-16 DIAGNOSIS — J449 Chronic obstructive pulmonary disease, unspecified: Secondary | ICD-10-CM

## 2018-05-16 MED ORDER — UMECLIDINIUM-VILANTEROL 62.5-25 MCG/INH IN AEPB: 1.0000 | INHALATION_SPRAY | Freq: Every day | RESPIRATORY_TRACT | 1 refills | Status: DC

## 2018-05-16 NOTE — Progress Notes (Signed)
Try anoro one click each am

## 2018-06-05 ENCOUNTER — Ambulatory Visit: Payer: Medicare PPO | Admitting: Internal Medicine

## 2018-06-10 DIAGNOSIS — J449 Chronic obstructive pulmonary disease, unspecified: Secondary | ICD-10-CM | POA: Diagnosis not present

## 2018-06-26 ENCOUNTER — Encounter: Payer: Self-pay | Admitting: Internal Medicine

## 2018-06-26 ENCOUNTER — Other Ambulatory Visit: Payer: Self-pay

## 2018-06-26 ENCOUNTER — Ambulatory Visit: Payer: Medicare HMO | Admitting: Internal Medicine

## 2018-06-26 VITALS — BP 116/80 | HR 78 | Temp 98.0°F | Ht 65.5 in | Wt 291.0 lb

## 2018-06-26 DIAGNOSIS — J449 Chronic obstructive pulmonary disease, unspecified: Secondary | ICD-10-CM | POA: Diagnosis not present

## 2018-06-26 DIAGNOSIS — J9611 Chronic respiratory failure with hypoxia: Secondary | ICD-10-CM

## 2018-06-26 MED ORDER — TIOTROPIUM BROMIDE-OLODATEROL 2.5-2.5 MCG/ACT IN AERS: 2.0000 | INHALATION_SPRAY | Freq: Every day | RESPIRATORY_TRACT | 0 refills | Status: DC

## 2018-06-26 NOTE — Patient Instructions (Signed)
Weight control is simply a matter of calorie balance which needs to be tilted in your favor by eating less and exercising more.  To get the most out of exercise, you need to be continuously aware that you are short of breath while on enough 02 to maintain your saturation above 90%, but never out of breath, for 30 minutes daily. As you improve, it will actually be easier for you to do the same amount of exercise  in  30 minutes so always push to the level where you are short of breath.  If this does not result in gradual weight reduction then I strongly recommend you see a nutritionist with a food diary x 2 weeks so that we can work out a negative calorie balance which is universally effective in steady weight loss programs.  Think of your calorie balance like you do your bank account where in this case you want the balance to go down so you must take in less calories than you burn up.  It's just that simple:  Hard to do, but easy to understand.  Good luck!    Please schedule a follow up visit in 6  months but call sooner if needed

## 2018-06-26 NOTE — Progress Notes (Signed)
Subjective:     Patient ID: Judy [name], female   DOB: [birth year],  MRN: [id number]    Brief patient profile:  57 yowf quit smoking 2007  @  150 lb  with cough that resolved and no resp problems until winter 2015 with doe x walking the dog s much in terms of cough then 10/04/13 sudden sense she couldn't get a breath and coughed up blood x one tsp plus slt green mucus  > better with saba and started on inhalers/ abx  >  better and pred taper and dx of GOLD II copd was made 11/2013 and non ischemic cardiomyopathy 05/14/14.     History of Present Illness  10/08/2013 1st [facility] office visit/  Chief Complaint  Patient presents with  . Pulmonary Consult    Referred by [facility]-sob with exertion x 6-8 mths.,worse since 4 days ago,occass. cough-tsp. of blood 4 days ago,usually unprod.,no wheezing,midchest tightness,no fcs,Had neb. since yesterday(used 2x yesterday)   baseline = 175ft   to mailbox and sob when gets there.  rec Clonidine 0.1 mg twice daily until you return  Plan A = automatic = symbiocort 160 Take 2 puffs first thing in am and then another 2 puffs about 12 hours later.  Plan B = Only use your albuterol (proair)as a rescue medication    Plan C = nebulizer albuterol, ok to use up to every 4 hours if can't get relief from Plan B GERD diet     LHC 05/14/14  1. No significant coronary artery disease 2. Moderate to severely reduced LV systolic function due to nonischemic cardiomyopathy.  3. Moderately elevated left ventricular end-diastolic pressure.    07/18/2017 acute extended ov/ re: re-establish for sob  Chief Complaint  Patient presents with  . Acute Visit    Increased SOB x 6-8 wks.  She states she is getting SOB just walking from room to room at home.   All started p abruptly became  light headed about 6 wk prior to OV  > bp was low > adjustments made to bp meds (decrease lasix and coreg) then sob worse s cough and now doe = MMRC4  = sob if tries  to leave home or while getting dressed some better when wears 02 but not monitoring daytime sats   02 is 2lpm with cpap and prn daytime  no noct symtpoms on cpap flat > feels rested rec Goal is to keep 02 sats over 90% at all times so take it with activity if needed and turn 02 up to achieve this goal  Continue lasix as needed to control feet swelling  Please see patient coordinator before you leave today  to schedule overnight  Oxymetry on 02 an cpap Please remember to go to the  x-ray department downstairs in the basement  for your tests - we will call you with the results when they are available.  Please schedule a follow up office visit in 6 weeks, call sooner if needed  - add stiolto 2 pffs each am     08/29/2017  f/u ov/ re:  GOLD III/ 02 dep hs with cpap and  ? With exertion  Chief Complaint  Patient presents with  . Follow-up    Breathing is some better but not back to baseline.  She has had sore throat and increased cough for the past few days. Cough has been non prod.    Dyspnea:  MMRC3 = some better but still  can't walk 100 yards  even at a slow pace at a flat grade s stopping due to sob but admits not good about checking sats when walking    Cough: dry day > noct new x sev months assoc with hoarseness not post nasal drip/ wheeze  SABA use: none 02: 2lpm at hs and prn daytime  rec Try prilosec otc   Take 30-60 min before first meal of the day and Pepcid ac (famotidine) 20 mg one @  bedtime until cough is completely gone for at least a week   Adjust 02 to sats > 90% at all times especially while walking to help you burn fat and get into negative balance      12/04/2017  f/u ov/ re:  GOLD III / 02 dep hs at 2lpm and 2lpm with ex bike Chief Complaint  Patient presents with  . Follow-up    COPD GOLD III  Dyspnea: ex bike  3 x weekly x 10 min s stopping and w/in 5 min needs 02 if not starting out with it  Cough: resolved  Sleeping: bed is flat/ L side down/ one  pillow/ cpap per turner  SABA use: none on stiolto 2 each am  02: as above   rec Work on maintaining perfect  inhaler technique  No change in medications Adjust your 02 upwards as needed to maintain 02 sats well above 90% while exercising     06/26/2018  f/u ov/ re:  COPD III/ 02 dep at hs and with exertion/ maint stiolto  Chief Complaint  Patient presents with  . Follow-up    Breathing is about the same. No new co's. She has no rescue inhaler.   Dyspnea:  Bike sev times a week x 5 min x 2 so total of 10 min not monitoring sats as rec Cough: no Sleeping: flat bed on side/ one pillow SABA use: none 02: 2lpm at hs  No flares since last ov    No obvious day to day or daytime variability or assoc excess/ purulent sputum or mucus plugs or hemoptysis or cp or chest tightness, subjective wheeze or overt sinus or hb symptoms.   Sleeping as above  without nocturnal  or early am exacerbation  of respiratory  c/o's or need for noct saba. Also denies any obvious fluctuation of symptoms with weather or environmental changes or other aggravating or alleviating factors except as outlined above   No unusual exposure hx or h/o childhood pna/ asthma or knowledge of premature birth.  Current Allergies, Complete Past Medical History, Past Surgical History, Family History, and Social History were reviewed in [employer] record.  ROS  The following are not active complaints unless bolded Hoarseness, sore throat, dysphagia, dental problems, itching, sneezing,  nasal congestion or discharge of excess mucus or purulent secretions, ear ache,   fever, chills, sweats, unintended wt loss or wt gain, classically pleuritic or exertional cp,  orthopnea pnd or arm/hand swelling  or leg swelling, presyncope, palpitations, abdominal pain, anorexia, nausea, vomiting, diarrhea  or change in bowel habits or change in bladder habits, change in stools or change in urine, dysuria, hematuria,  rash,  arthralgias, visual complaints, headache, numbness, weakness or ataxia or problems with walking or coordination,  change in mood or  memory.        Current Meds  Medication Sig  . aspirin 81 MG tablet Take 81 mg by mouth at bedtime.   Marland Kitchen atorvastatin (LIPITOR) 20 MG tablet TAKE 1 TABLET BY MOUTH EVERY DAY AT  6 PM  . carvedilol (COREG) 12.5 MG tablet TAKE 1 TABLET TWICE DAILY WITH A MEAL  . Cholecalciferol (VITAMIN D-3) 5000 units TABS Take 1 capsule by mouth daily.  . furosemide (LASIX) 20 MG tablet Take 1 tablet (20 mg total) by mouth as needed for edema.  . gabapentin (NEURONTIN) 400 MG capsule Take 2 tablets by mouth in the AM and 2 by mouth in the PM  . levothyroxine (SYNTHROID, LEVOTHROID) 75 MCG tablet Take 75 mcg by mouth daily.  . Multiple Vitamin (MULTIVITAMIN WITH MINERALS) TABS tablet Take 1 tablet by mouth daily.  . OXYGEN 2lpm 2 lpm as needed  . sacubitril-valsartan (ENTRESTO) 97-103 MG Take 1 tablet by mouth 2 (two) times daily.  . Tiotropium Bromide-Olodaterol (STIOLTO RESPIMAT) 2.5-2.5 MCG/ACT AERS Inhale 2 puffs into the lungs daily.  Marland Kitchen tiZANidine (ZANAFLEX) 4 MG tablet Take 4 mg by mouth daily.  . traMADol (ULTRAM) 50 MG tablet Take 1 tablet by mouth every 6 (six) hours as needed.  Marland Kitchen UNABLE TO FIND CPAP with o2 2lpm  DME- [facility]                       Objective:   Physical Exam  amb obese wf using rollator for back problems  11/27/2013     237 >   01/08/2014  236 > 01/21/2014 241 >235 02/21/2014 > 02/26/2014  232 > 03/27/2014  236 >236 04/10/2014 > 05/22/2014   237 >  08/21/2014 245 > 10/09/2014 257 >  01/12/2015   268 > 07/14/2015  278 > 01/14/2016  292  > 07/18/2017   301  > 08/29/2017  303 > 12/04/2017  298 > 06/26/2018  291   Vital signs reviewed - Note on arrival 02 sats  96% on RA   HEENT: nl dentition, turbinates bilaterally, and oropharynx. Nl external ear canals without cough reflex   NECK :  without JVD/Nodes/TM/ nl carotid upstrokes bilaterally   LUNGS: no acc  muscle use,  Nl contour chest with distant bs bilaterally without cough on insp or exp maneuvers   CV:  RRR  no s3 or murmur or increase in P2, and no edema   ABD: obese soft and nontender with nl inspiratory excursion in the supine position. No bruits or organomegaly appreciated, bowel sounds nl  MS:  Walks with rollator/ ext warm without deformities, calf tenderness, cyanosis or clubbing No obvious joint restrictions   SKIN: warm and dry without lesions    NEURO:  alert, approp, nl sensorium with  no motor or cerebellar deficits apparent.         Assessment:

## 2018-07-01 ENCOUNTER — Encounter: Payer: Self-pay | Admitting: Internal Medicine

## 2018-07-01 NOTE — Assessment & Plan Note (Signed)
Walk with desats 88% 04/10/2014 >begin O2 w/ act 2 l/m  -07/14/2015  Walked RA x 3 laps @ 185 ft each stopped due to end of study, nl pace, no significant desat or sob. > hs 02 only  - ono on 2lpm/cpap  07/20/2017   desats < 89% x 2.4 min  >>>Adequate control on present rx, reviewed in detail with pt > no change in rx needed  But needs to be sure sats are ok on bike ex and titrate accordingly

## 2018-07-01 NOTE — Assessment & Plan Note (Signed)
Quit smoking around 2007  - 11/27/2013  PFTs   FEV1  1.51 (54%) ratio 52 and and no better p B2 and DLCO  71 - 01/08/2014  p extensive coaching HFA effectiveness =    90% > rec resume symbicort 160 2bid  - 03/27/14 trial of dulera 200/tudorza samples only to assure adherence  04/10/2014   Alpha 1 MM , nml level  - trial off spiriva 05/22/14 / off all resp rx 08/22/14 - PFT's  10/09/2014  FEV1 1.64 (59 % ) ratio 54  p 11 % improvement from saba with DLCO  69 % corrects to 73  % for alv volume   - 07/14/2015  A  > try spiriva 2 puffs each am > no better so pt d/c'd  - PFT's  07/18/2017  FEV1 1.10 (41 % ) ratio 46   p 19 % improvement from saba p nothing prior to study with DLCO  70 % corrects to 75  % for alv volume  On coreg  - 07/18/2017    try stiolto  - Spirometry 12/04/2017  FEV1 1.3 (49%)  Ratio 49 p am stiolto x 2 puffs  w classic curvature  - 06/26/2018  After extensive coaching inhaler device,  effectiveness =   90%   Pt is Group B in terms of symptom/risk and laba/lama therefore appropriate rx at this point >>>  No change rx = stiolto   Advised:  formulary restrictions will be an ongoing challenge for the forseable future and I would be happy to pick an alternative if the pt will first  provide me a list of them -  pt  will need to return here for training for any new device that is required eg dpi vs hfa vs respimat.    In the meantime we can always provide samples so that the patient never runs out of any needed respiratory medications.

## 2018-07-01 NOTE — Assessment & Plan Note (Signed)
Body mass index is 47.69 kg/m.  -  trending down sltly/ encouraged Lab Results  Component Value Date   TSH 2.110 06/12/2017     Contributing to gerd risk/ doe/reviewed the need and the process to achieve and maintain neg calorie balance > defer f/u primary care including intermittently monitoring thyroid status      I had an extended discussion with the patient reviewing all relevant studies completed to date and  lasting 15 to 20 minutes of a 25 minute visit    See device teaching which extended face to face time for this visit.  Each maintenance medication was reviewed in detail including emphasizing most importantly the difference between maintenance and prns and under what circumstances the prns are to be triggered using an action plan format that is not reflected in the computer generated alphabetically organized AVS which I have not found useful in most complex patients, especially with respiratory illnesses  Please see AVS for specific instructions unique to this visit that I personally wrote and verbalized to the the pt in detail and then reviewed with pt  by my nurse highlighting any  changes in therapy recommended at today's visit to their plan of care.

## 2018-07-03 DIAGNOSIS — E038 Other specified hypothyroidism: Secondary | ICD-10-CM | POA: Diagnosis not present

## 2018-07-03 DIAGNOSIS — I1310 Hypertensive heart and chronic kidney disease without heart failure, with stage 1 through stage 4 chronic kidney disease, or unspecified chronic kidney disease: Secondary | ICD-10-CM | POA: Diagnosis not present

## 2018-07-03 DIAGNOSIS — E1142 Type 2 diabetes mellitus with diabetic polyneuropathy: Secondary | ICD-10-CM | POA: Diagnosis not present

## 2018-07-03 DIAGNOSIS — G4733 Obstructive sleep apnea (adult) (pediatric): Secondary | ICD-10-CM | POA: Diagnosis not present

## 2018-07-03 DIAGNOSIS — E782 Mixed hyperlipidemia: Secondary | ICD-10-CM | POA: Diagnosis not present

## 2018-07-05 DIAGNOSIS — I110 Hypertensive heart disease with heart failure: Secondary | ICD-10-CM | POA: Diagnosis not present

## 2018-07-05 DIAGNOSIS — I5022 Chronic systolic (congestive) heart failure: Secondary | ICD-10-CM | POA: Diagnosis not present

## 2018-07-05 DIAGNOSIS — E038 Other specified hypothyroidism: Secondary | ICD-10-CM | POA: Diagnosis not present

## 2018-07-05 DIAGNOSIS — M545 Low back pain: Secondary | ICD-10-CM | POA: Diagnosis not present

## 2018-07-05 DIAGNOSIS — E1142 Type 2 diabetes mellitus with diabetic polyneuropathy: Secondary | ICD-10-CM | POA: Diagnosis not present

## 2018-07-05 DIAGNOSIS — Z6841 Body Mass Index (BMI) 40.0 and over, adult: Secondary | ICD-10-CM | POA: Diagnosis not present

## 2018-07-05 DIAGNOSIS — E782 Mixed hyperlipidemia: Secondary | ICD-10-CM | POA: Diagnosis not present

## 2018-07-05 DIAGNOSIS — G4733 Obstructive sleep apnea (adult) (pediatric): Secondary | ICD-10-CM | POA: Diagnosis not present

## 2018-07-10 DIAGNOSIS — J449 Chronic obstructive pulmonary disease, unspecified: Secondary | ICD-10-CM | POA: Diagnosis not present

## 2018-08-10 DIAGNOSIS — J449 Chronic obstructive pulmonary disease, unspecified: Secondary | ICD-10-CM | POA: Diagnosis not present

## 2018-08-14 DIAGNOSIS — E782 Mixed hyperlipidemia: Secondary | ICD-10-CM | POA: Diagnosis not present

## 2018-08-14 DIAGNOSIS — I5022 Chronic systolic (congestive) heart failure: Secondary | ICD-10-CM | POA: Diagnosis not present

## 2018-08-14 DIAGNOSIS — E1142 Type 2 diabetes mellitus with diabetic polyneuropathy: Secondary | ICD-10-CM | POA: Diagnosis not present

## 2018-08-15 DIAGNOSIS — Z0000 Encounter for general adult medical examination without abnormal findings: Secondary | ICD-10-CM | POA: Diagnosis not present

## 2018-08-15 DIAGNOSIS — Z6841 Body Mass Index (BMI) 40.0 and over, adult: Secondary | ICD-10-CM | POA: Diagnosis not present

## 2018-09-03 ENCOUNTER — Other Ambulatory Visit: Payer: Self-pay | Admitting: Cardiology

## 2018-09-03 ENCOUNTER — Ambulatory Visit: Payer: Medicare PPO | Admitting: Internal Medicine

## 2018-09-09 DIAGNOSIS — J449 Chronic obstructive pulmonary disease, unspecified: Secondary | ICD-10-CM | POA: Diagnosis not present

## 2018-09-24 ENCOUNTER — Emergency Department (HOSPITAL_COMMUNITY)
Admission: EM | Admit: 2018-09-24 | Discharge: 2018-09-24 | Disposition: A | Discharge status: Home / Self Care | Payer: Medicare HMO | Attending: Emergency Medicine | Admitting: Emergency Medicine

## 2018-09-24 ENCOUNTER — Other Ambulatory Visit: Payer: Self-pay

## 2018-09-24 DIAGNOSIS — Z5321 Procedure and treatment not carried out due to patient leaving prior to being seen by health care provider: Secondary | ICD-10-CM | POA: Diagnosis not present

## 2018-09-24 DIAGNOSIS — R109 Unspecified abdominal pain: Secondary | ICD-10-CM | POA: Insufficient documentation

## 2018-09-24 LAB — COMPREHENSIVE METABOLIC PANEL
ALT: 40 U/L (ref 0–44)
AST: 28 U/L (ref 15–41)
Albumin: 3.7 g/dL (ref 3.5–5.0)
Alkaline Phosphatase: 177 U/L — ABNORMAL HIGH (ref 38–126)
Anion gap: 13 (ref 5–15)
BUN: 10 mg/dL (ref 6–20)
CO2: 27 mmol/L (ref 22–32)
Calcium: 9.4 mg/dL (ref 8.9–10.3)
Chloride: 101 mmol/L (ref 98–111)
Creatinine, Ser: 0.7 mg/dL (ref 0.44–1.00)
GFR calc Af Amer: >60 mL/min (ref >60)
GFR calc non Af Amer: >60 mL/min (ref >60)
Glucose, Bld: 183 mg/dL — ABNORMAL HIGH (ref 70–99)
Potassium: 4.2 mmol/L (ref 3.5–5.1)
Sodium: 141 mmol/L (ref 135–145)
Total Bilirubin: 0.8 mg/dL (ref 0.3–1.2)
Total Protein: 7 g/dL (ref 6.5–8.1)

## 2018-09-24 LAB — LIPASE, BLOOD: Lipase: 36 U/L (ref 11–51)

## 2018-09-24 LAB — URINALYSIS, ROUTINE W REFLEX MICROSCOPIC
Bilirubin Urine: NEGATIVE
Glucose, UA: >=500 mg/dL — AB
Hgb urine dipstick: NEGATIVE
Ketones, ur: NEGATIVE mg/dL
Leukocytes,Ua: NEGATIVE
Nitrite: NEGATIVE
Protein, ur: NEGATIVE mg/dL
Specific Gravity, Urine: 1.039 — ABNORMAL HIGH (ref 1.005–1.030)
pH: 5 (ref 5.0–8.0)

## 2018-09-24 LAB — CBC
HCT: 47.2 % — ABNORMAL HIGH (ref 36.0–46.0)
Hemoglobin: 15.1 g/dL — ABNORMAL HIGH (ref 12.0–15.0)
MCH: 30.1 pg (ref 26.0–34.0)
MCHC: 32 g/dL (ref 30.0–36.0)
MCV: 94 fL (ref 80.0–100.0)
Platelets: 244 10*3/uL (ref 150–400)
RBC: 5.02 MIL/uL (ref 3.87–5.11)
RDW: 13.3 % (ref 11.5–15.5)
WBC: 12.3 10*3/uL — ABNORMAL HIGH (ref 4.0–10.5)
nRBC: 0 % (ref 0.0–0.2)

## 2018-09-24 LAB — I-STAT BETA HCG BLOOD, ED (MC, WL, AP ONLY): I-stat hCG, quantitative: 5.1 m[IU]/mL — ABNORMAL HIGH (ref <5)

## 2018-09-24 NOTE — ED Triage Notes (Signed)
Pt arrives POV for eval of back pain radiation to abd pain. Pt reports generalized abd pain w/ no identifiable point of tenderness. Pt reports as soon as she arrived, pain disseminated. States 2/10 at this time. Reports pain is continuing to improve

## 2018-09-24 NOTE — ED Notes (Signed)
Pt stated she was leaving, seen going towards parking lot

## 2018-09-25 DIAGNOSIS — A09 Infectious gastroenteritis and colitis, unspecified: Secondary | ICD-10-CM | POA: Diagnosis not present

## 2018-10-03 DIAGNOSIS — G4733 Obstructive sleep apnea (adult) (pediatric): Secondary | ICD-10-CM | POA: Diagnosis not present

## 2018-10-08 ENCOUNTER — Other Ambulatory Visit: Payer: Self-pay | Admitting: Cardiology

## 2018-10-10 DIAGNOSIS — J449 Chronic obstructive pulmonary disease, unspecified: Secondary | ICD-10-CM | POA: Diagnosis not present

## 2018-11-04 DIAGNOSIS — Z23 Encounter for immunization: Secondary | ICD-10-CM | POA: Diagnosis not present

## 2018-11-10 DIAGNOSIS — J449 Chronic obstructive pulmonary disease, unspecified: Secondary | ICD-10-CM | POA: Diagnosis not present

## 2018-11-30 DIAGNOSIS — L03115 Cellulitis of right lower limb: Secondary | ICD-10-CM | POA: Diagnosis not present

## 2018-11-30 DIAGNOSIS — Z736 Limitation of activities due to disability: Secondary | ICD-10-CM | POA: Diagnosis not present

## 2018-12-10 DIAGNOSIS — J449 Chronic obstructive pulmonary disease, unspecified: Secondary | ICD-10-CM | POA: Diagnosis not present

## 2018-12-19 ENCOUNTER — Telehealth: Payer: Self-pay | Admitting: *Deleted

## 2018-12-19 NOTE — Telephone Encounter (Signed)
Virtual Visit Pre-Appointment Phone Call  "(Name), I am calling you today to discuss your upcoming appointment. We are currently trying to limit exposure to the virus that causes COVID-19 by seeing patients at home rather than in the office."  1. "What is the BEST phone number to call the day of the visit?" - include this in appointment notes  2. "Do you have or have access to (through a family member/friend) a smartphone with video capability that we can use for your visit?" a. If yes - list this number in appt notes as "cell" (if different from BEST phone #) and list the appointment type as a VIDEO visit in appointment notes b. If no - list the appointment type as a PHONE visit in appointment notes  3. Confirm consent - "In the setting of the current Covid19 crisis, you are scheduled for a (phone or video) visit with your provider on (date) at (time).  Just as we do with many in-office visits, in order for you to participate in this visit, we must obtain consent.  If you'd like, I can send this to your mychart (if signed up) or email for you to review.  Otherwise, I can obtain your verbal consent now.  All virtual visits are billed to your insurance company just like a normal visit would be.  By agreeing to a virtual visit, we'd like you to understand that the technology does not allow for your provider to perform an examination, and thus may limit your provider's ability to fully assess your condition. If your provider identifies any concerns that need to be evaluated in person, we will make arrangements to do so.  Finally, though the technology is pretty good, we cannot assure that it will always work on either your or our end, and in the setting of a video visit, we may have to convert it to a phone-only visit.  In either situation, we cannot ensure that we have a secure connection.  Are you willing to proceed?" STAFF: Did the patient verbally acknowledge consent to telehealth visit? Document  YES/NO here: YES  4. Advise patient to be prepared - "Two hours prior to your appointment, go ahead and check your blood pressure, pulse, oxygen saturation, and your weight (if you have the equipment to check those) and write them all down. When your visit starts, your provider will ask you for this information. If you have an Apple Watch or Kardia device, please plan to have heart rate information ready on the day of your appointment. Please have a pen and paper handy nearby the day of the visit as well."  5. Give patient instructions for MyChart download to smartphone OR [email or web address] as below if video visit (depending on what platform provider is using)  6. Inform patient they will receive a phone call 15 minutes prior to their appointment time (may be from unknown caller ID) so they should be prepared to answer    TELEPHONE CALL NOTE  SHAMERIA TRIMARCO has been deemed a candidate for a follow-up tele-health visit to limit community exposure during the Covid-19 pandemic. I spoke with the patient via phone to ensure availability of phone/video source, confirm preferred email & phone number, and discuss instructions and expectations.  I reminded MARYJEAN CORPENING to be prepared with any vital sign and/or heart rhythm information that could potentially be obtained via home monitoring, at the time of her visit. I reminded JOAN HERSCHBERGER to expect a phone call prior to  her visit.  Latrelle Dodrill, CMA 12/19/2018 11:25 AM   INSTRUCTIONS FOR DOWNLOADING THE MYCHART APP TO SMARTPHONE  - The patient must first make sure to have activated MyChart and know their login information - If Apple, go to [employer] and type in MyChart in the search bar and download the app. If Android, ask patient to go to [employer] and type in [place] in the search bar and download the app. The app is free but as with any other app downloads, their phone may require them to verify saved payment information or  Apple/Android password.  - The patient will need to then log into the app with their MyChart username and password, and select [facility] as their healthcare provider to link the account. When it is time for your visit, go to the MyChart app, find appointments, and click Begin Video Visit. Be sure to Select Allow for your device to access the Microphone and Camera for your visit. You will then be connected, and your provider will be with you shortly.  **If they have any issues connecting, or need assistance please contact MyChart service desk [phone])**  **If using a computer, in order to ensure the best quality for their visit they will need to use either of the following Internet Browsers: [employer] or Google Chrome**  IF USING DOXIMITY or DOXY.ME - The patient will receive a link just prior to their visit by text.     FULL LENGTH CONSENT FOR TELE-HEALTH VISIT   I hereby voluntarily request, consent and authorize [facility] and its employed or contracted physicians, physician assistants, nurse practitioners or other licensed health care professionals (the Practitioner), to provide me with telemedicine health care services (the "Services") as deemed necessary by the treating Practitioner. I acknowledge and consent to receive the Services by the Practitioner via telemedicine. I understand that the telemedicine visit will involve communicating with the Practitioner through live audiovisual communication technology and the disclosure of certain medical information by electronic transmission. I acknowledge that I have been given the opportunity to request an in-person assessment or other available alternative prior to the telemedicine visit and am voluntarily participating in the telemedicine visit.  I understand that I have the right to withhold or withdraw my consent to the use of telemedicine in the course of my care at any time, without affecting my right to future care  or treatment, and that the Practitioner or I may terminate the telemedicine visit at any time. I understand that I have the right to inspect all information obtained and/or recorded in the course of the telemedicine visit and may receive copies of available information for a reasonable fee.  I understand that some of the potential risks of receiving the Services via telemedicine include:  Marland Kitchen Delay or interruption in medical evaluation due to technological equipment failure or disruption; . Information transmitted may not be sufficient (e.g. poor resolution of images) to allow for appropriate medical decision making by the Practitioner; and/or  . In rare instances, security protocols could fail, causing a breach of personal health information.  Furthermore, I acknowledge that it is my responsibility to provide information about my medical history, conditions and care that is complete and accurate to the best of my ability. I acknowledge that Practitioner's advice, recommendations, and/or decision may be based on factors not within their control, such as incomplete or inaccurate data provided by me or distortions of diagnostic images or specimens that may result from electronic transmissions.  I understand that the practice of medicine is not an exact science and that Practitioner makes no warranties or guarantees regarding treatment outcomes. I acknowledge that I will receive a copy of this consent concurrently upon execution via email to the email address I last provided but may also request a printed copy by calling the office of [facility].    I understand that my insurance will be billed for this visit.   I have read or had this consent read to me. . I understand the contents of this consent, which adequately explains the benefits and risks of the Services being provided via telemedicine.  . I have been provided ample opportunity to ask questions regarding this consent and the Services and have had  my questions answered to my satisfaction. . I give my informed consent for the services to be provided through the use of telemedicine in my medical care  By participating in this telemedicine visit I agree to the above.

## 2018-12-20 ENCOUNTER — Telehealth: Payer: Self-pay | Admitting: *Deleted

## 2018-12-20 NOTE — Telephone Encounter (Signed)
Informed patient of compliance results and verbalized understanding was indicated. Patient is aware and agreeable to AHI being within range at 0.6. Patient is aware and agreeable to being in compliance with machine usage. Patient is aware and agreeable to no change in current pressures 

## 2018-12-20 NOTE — Telephone Encounter (Signed)
-----   Message from Sueanne Margarita, MD sent at 12/20/2018  9:57 AM EST ----- Good AHI and compliance.  Continue current PAP settings.

## 2018-12-21 ENCOUNTER — Telehealth: Payer: Self-pay

## 2018-12-21 MED ORDER — ENTRESTO 97-103 MG PO TABS: 1.0000 | ORAL_TABLET | Freq: Two times a day (BID) | ORAL | 0 refills | Status: DC

## 2018-12-21 NOTE — Telephone Encounter (Signed)
Refill for Entresto sent to pharmacy.  °

## 2018-12-24 ENCOUNTER — Other Ambulatory Visit: Payer: Self-pay

## 2018-12-24 ENCOUNTER — Encounter: Payer: Self-pay | Admitting: *Deleted

## 2018-12-24 ENCOUNTER — Telehealth (INDEPENDENT_AMBULATORY_CARE_PROVIDER_SITE_OTHER): Payer: Medicare HMO | Admitting: Cardiology

## 2018-12-24 ENCOUNTER — Encounter: Payer: Self-pay | Admitting: Cardiology

## 2018-12-24 VITALS — BP 121/71 | HR 63 | Ht 63.0 in | Wt 290.0 lb

## 2018-12-24 DIAGNOSIS — I5022 Chronic systolic (congestive) heart failure: Secondary | ICD-10-CM | POA: Diagnosis not present

## 2018-12-24 DIAGNOSIS — I10 Essential (primary) hypertension: Secondary | ICD-10-CM | POA: Diagnosis not present

## 2018-12-24 DIAGNOSIS — I420 Dilated cardiomyopathy: Secondary | ICD-10-CM

## 2018-12-24 DIAGNOSIS — G4733 Obstructive sleep apnea (adult) (pediatric): Secondary | ICD-10-CM

## 2018-12-24 NOTE — Progress Notes (Signed)
Virtual Visit via Telephone Note   This visit type was conducted due to national recommendations for restrictions regarding the COVID-19 Pandemic (e.g. social distancing) in an effort to limit this patient's exposure and mitigate transmission in our community.  Due to her co-morbid illnesses, this patient is at least at moderate risk for complications without adequate follow up.  This format is felt to be most appropriate for this patient at this time.  All issues noted in this document were discussed and addressed.  A limited physical exam was performed with this format.  Please refer to the patient's chart for her consent to telehealth for [facility].   Evaluation Performed:  Follow-up visit  This visit type was conducted due to national recommendations for restrictions regarding the COVID-19 Pandemic (e.g. social distancing).  This format is felt to be most appropriate for this patient at this time.  All issues noted in this document were discussed and addressed.  No physical exam was performed (except for noted visual exam findings with Video Visits).  Please refer to the patient's chart (MyChart message for video visits and phone note for telephone visits) for the patient's consent to telehealth for [facility].  Date:  12/24/2018   ID:  Judy [name], DOB November 14, 1960, MRN [id number]  Patient Location:  Home  Provider location:   [place]  PCP:  Blane Ohara, MD  Cardiologist:  Armanda Magic, MD Electrophysiologist:  None   Chief Complaint:  OSA, HICM, CHF, HTN, HLD  History of Present Illness:    Judy [name] is a 58 y.o. female who presents via audio/video conferencing for a telehealth visit today.    Judy [name] is a 58 y.o. female with a hx of NICM, chronic combined CHF,OSA on PAP,COPD, former tobacco abuse, HTN, HLD, morbid obesity. Prior echo 12/2013 showed EF 45-50%. She had abnormal stress test 04/2014 resulting in cardiac cath 05/14/14 showing no  significant CAD, EF 30%. Subsequent echo 08/2014 (poor quality) with EF approximately 35%. She was treated with medical therapy and subsequent cMRI 09/2014 showed diffuse HK, EF 28%, no infiltrative disease. Previous AICD recommended but she had been hesitant to pursue. F/U echo was obtained 07/29/16 showed EF approximately 45-50%, mild LVH, grade 2 DD, mild MR, mildly reduced RV function, PASP .  Repeat echo 06/19/2017 showed normalization of LV function with EF 55%.  She is here today for followup and is doing well.  She has chronic DOE which she thinks it is stable. She can take a shower without DOE but she gets SOB drying her hair.  She does get SOB with housekeeping.  She follows with Dr. Sherene Sires for her COPD and he has encouraged her to lose weight. She denies any chest pain or pressure,  PND, orthopnea, LE edema, dizziness, palpitations or syncope.   She is compliant with her meds and is tolerating meds with no SE.  She is doing well with her CPAP device and thinks that she has gotten used to it.  She tolerates the mask and feels the pressure is adequate.  Since going on CPAP she feels rested in the am and has no significant daytime sleepiness.  She denies any significant mouth or nasal dryness or nasal congestion.  She does not think that he snores.    The patient does not have symptoms concerning for COVID-19 infection (fever, chills, cough, or new shortness of breath).   Prior CV studies:   The following studies were reviewed today:  PAP compliance report  Past Medical History:  Diagnosis Date  . Chronic combined systolic and diastolic CHF (congestive heart failure) ([place]) 10/07/2014  . COPD (chronic obstructive pulmonary disease) ([place])   . DCM (dilated cardiomyopathy) ([place]) 04/25/2014   EF 28% by MRI despite maximum medical therapy  . Former tobacco use   . Hyperlipidemia   . Hypertension   . Morbid obesity ([place])   . NICM (nonischemic cardiomyopathy) ([place])   . OSA (obstructive sleep  apnea)    moderate with AHI 26/hr with oxygen desaturations as low as 70%  . Scoliosis    Past Surgical History:  Procedure Laterality Date  . ABDOMINAL HYSTERECTOMY    . ankle artery involved cyst removal    . CARDIAC CATHETERIZATION  05/14/2014   normal coronary arteries  . CARPAL TUNNEL RELEASE    . FASCIOTOMY     1993 for platar fasciitis  . harrington rod scoliosis     1981  . LEFT HEART CATHETERIZATION WITH CORONARY ANGIOGRAM N/A 05/14/2014   Procedure: LEFT HEART CATHETERIZATION WITH CORONARY ANGIOGRAM;  Surgeon: Wellington Hampshire, MD;  Location: [facility];  Service: Cardiovascular;  Laterality: N/A;  . ROTATOR CUFF REPAIR    . UMBILICAL HERNIA REPAIR    . VESICOVAGINAL FISTULA CLOSURE W/ TAH       Current Meds  Medication Sig  . aspirin 81 MG tablet Take 81 mg by mouth at bedtime.   Marland Kitchen atorvastatin (LIPITOR) 20 MG tablet TAKE 1 TABLET BY MOUTH EVERY DAY AT 6 PM  . carvedilol (COREG) 12.5 MG tablet TAKE 1 TABLET TWICE DAILY WITH A MEAL  . Cholecalciferol (VITAMIN D-3) 5000 units TABS Take 1 capsule by mouth daily.  Marland Kitchen FARXIGA 10 MG TABS tablet Take 10 mg by mouth daily.  Marland Kitchen gabapentin (NEURONTIN) 400 MG capsule Take 2 tablets by mouth in the AM and 2 by mouth in the PM  . levothyroxine (SYNTHROID, LEVOTHROID) 75 MCG tablet Take 75 mcg by mouth daily.  . Multiple Vitamin (MULTIVITAMIN WITH MINERALS) TABS tablet Take 1 tablet by mouth daily.  . OXYGEN 2lpm 2 lpm as needed  . sacubitril-valsartan (ENTRESTO) 97-103 MG Take 1 tablet by mouth 2 (two) times daily.  . Tiotropium Bromide-Olodaterol (STIOLTO RESPIMAT) 2.5-2.5 MCG/ACT AERS Inhale 2 puffs into the lungs daily.  Marland Kitchen tiZANidine (ZANAFLEX) 4 MG tablet Take 4 mg by mouth daily.  . traMADol (ULTRAM) 50 MG tablet Take 1 tablet by mouth every 6 (six) hours as needed.     Allergies:   Demerol [meperidine]   Social History   Tobacco Use  . Smoking status: Former Smoker    Packs/day: 1.00    Years: 15.00    Pack years:  15.00    Types: Cigarettes    Quit date: 02/14/2005    Years since quitting: 13.8  . Smokeless tobacco: Never Used  Substance Use Topics  . Alcohol use: Yes    Comment: occ  . Drug use: No     Family Hx: The patient's family history includes Allergies in her father; Emphysema in her father; Heart disease (age of onset: 49) in her father; Heart failure in her father.  ROS:   Please see the history of present illness.     All other systems reviewed and are negative.   Labs/Other Tests and Data Reviewed:    Recent Labs: 09/24/2018: ALT 40; BUN 10; Creatinine, Ser 0.70; Hemoglobin 15.1; Platelets 244; Potassium 4.2; Sodium 141   Recent Lipid Panel Lab Results  Component Value Date/Time  CHOL 146 07/12/2016 11:15 AM   TRIG 151 (H) 07/12/2016 11:15 AM   HDL 41 07/12/2016 11:15 AM   CHOLHDL 3.6 07/12/2016 11:15 AM   LDLCALC 75 07/12/2016 11:15 AM    Wt Readings from Last 3 Encounters:  12/24/18 290 lb (131.5 kg)  09/24/18 295 lb (133.8 kg)  06/26/18 291 lb (132 kg)     Objective:    Vital Signs:  BP 121/71   Pulse 63   Ht 5\' 3"  (1.6 m)   Wt 290 lb (131.5 kg)   BMI 51.37 kg/m    ASSESSMENT & PLAN:    1.  Chronic diastolic CHF -she is currently NYHA class 2b -she still has stable DOE and does not sound volume overloaded -her weight is stable and she denies any LE edema -Continue Lasix PRN  2.  Nonischemic DCM -essentially normal coronary arteries by cath 2016 -DCM resolved with EF 55% on echo 2018 -continue Entresto, carvedilol and PRN diuretic -creatinine was 0.7 and K+ 4.2 in August.  3.  HTN -BP controlled on exam -continue Entresto 97-103mg  BID and Carvedilol 12.5mg  BID  4.  OSA - The patient is tolerating PAP therapy well without any problems. The PAP download was reviewed today and showed an AHI of 0.6/hr on 13 cm H2O with 100% compliance in using more than 4 hours nightly.  The patient has been using and benefiting from PAP use and will continue to  benefit from therapy.   5.  Morbid Obesity -I have encouraged her to get into a routine exercise program and cut back on carbs and portions.   COVID-19 Education: The signs and symptoms of COVID-19 were discussed with the patient and how to seek care for testing (follow up with PCP or arrange E-visit).  The importance of social distancing was discussed today.  Patient Risk:   After full review of this patient's clinical status, I feel that they are at least moderate risk at this time.  Time:   Today, I have spent 20 minutes directly with the patient on telemedicine discussing medical problems including CHF, DCM, HTN, OSA.  We also reviewed the symptoms of COVID 19 and the ways to protect against contracting the virus with telehealth technology.  I spent an additional 5 minutes reviewing patient's chart including labs and PAP compliance download.  Medication Adjustments/Labs and Tests Ordered: Current medicines are reviewed at length with the patient today.  Concerns regarding medicines are outlined above.  Tests Ordered: No orders of the defined types were placed in this encounter.  Medication Changes: No orders of the defined types were placed in this encounter.   Disposition:  Follow up in 1 year(s)  Signed, Armanda Magic, MD  12/24/2018 11:22 AM    [facility]

## 2018-12-24 NOTE — Patient Instructions (Signed)
Medication Instructions:   Your physician recommends that you continue on your current medications as directed. Please refer to the Current Medication list given to you today.  *If you need a refill on your cardiac medications before your next appointment, please call your pharmacy*    Follow-Up: At [facility], you and your health needs are our priority.  As part of our continuing mission to provide you with exceptional heart care, we have created designated Provider Care Teams.  These Care Teams include your primary Cardiologist (physician) and Advanced Practice Providers (APPs -  Physician Assistants and Nurse Practitioners) who all work together to provide you with the care you need, when you need it.  Your next appointment:   12 months  The format for your next appointment:   Either In Person or Virtual  Provider:   Traci Turner, MD    

## 2018-12-27 ENCOUNTER — Ambulatory Visit: Payer: Medicare HMO | Admitting: Internal Medicine

## 2018-12-31 ENCOUNTER — Other Ambulatory Visit: Payer: Self-pay

## 2018-12-31 ENCOUNTER — Ambulatory Visit: Payer: Medicare HMO | Admitting: Internal Medicine

## 2018-12-31 ENCOUNTER — Encounter: Payer: Self-pay | Admitting: Internal Medicine

## 2018-12-31 DIAGNOSIS — J9611 Chronic respiratory failure with hypoxia: Secondary | ICD-10-CM | POA: Diagnosis not present

## 2018-12-31 DIAGNOSIS — J449 Chronic obstructive pulmonary disease, unspecified: Secondary | ICD-10-CM | POA: Diagnosis not present

## 2018-12-31 NOTE — Patient Instructions (Signed)
No change treatment    Please schedule a follow up visit in 6  months but call sooner if needed

## 2018-12-31 NOTE — Progress Notes (Signed)
Subjective:     Patient ID: Judy [name], female   DOB: 1960/08/03,  MRN: [id number]    Brief patient profile:  58 yowf quit smoking 2007  @  150 lb  with cough that resolved and no resp problems until winter 2015 with doe x walking the dog s much in terms of cough then 10/04/13 sudden sense she couldn't get a breath and coughed up blood x one tsp plus slt green mucus  > better with saba and started on inhalers/ abx  >  better and pred taper and dx of GOLD II copd was made 11/2013 and non ischemic cardiomyopathy 05/14/14.     History of Present Illness  10/08/2013 1st [facility] office visit/ [facility] Chief Complaint  Patient presents with  . Pulmonary Consult    Referred by [facility] Cox-sob with exertion x 6-8 mths.,worse since 4 days ago,occass. cough-tsp. of blood 4 days ago,usually unprod.,no wheezing,midchest tightness,no fcs,Had neb. since yesterday(used 2x yesterday)   baseline = 153ft   to mailbox and sob when gets there.  rec Clonidine 0.1 mg twice daily until you return  Plan A = automatic = symbiocort 160 Take 2 puffs first thing in am and then another 2 puffs about 12 hours later.  Plan B = Only use your albuterol (proair)as a rescue medication    Plan C = nebulizer albuterol, ok to use up to every 4 hours if can't get relief from Plan B GERD diet     LHC 05/14/14  1. No significant coronary artery disease 2. Moderate to severely reduced LV systolic function due to nonischemic cardiomyopathy.  3. Moderately elevated left ventricular end-diastolic pressure.    07/18/2017 acute extended ov/Wert re: re-establish for sob  Chief Complaint  Patient presents with  . Acute Visit    Increased SOB x 6-8 wks.  She states she is getting SOB just walking from room to room at home.   All started p abruptly became  light headed about 6 wk prior to OV  > bp was low > adjustments made to bp meds (decrease lasix and coreg) then sob worse s cough and now doe = MMRC4  = sob if tries  to leave home or while getting dressed some better when wears 02 but not monitoring daytime sats   02 is 2lpm with cpap and prn daytime  no noct symtpoms on cpap flat > feels rested rec Goal is to keep 02 sats over 90% at all times so take it with activity if needed and turn 02 up to achieve this goal  Continue lasix as needed to control feet swelling  Please see patient coordinator before you leave today  to schedule overnight  Oxymetry on 02 an cpap Please remember to go to the  x-ray department downstairs in the basement  for your tests - we will call you with the results when they are available.  Please schedule a follow up office visit in 6 weeks, call sooner if needed  - add stiolto 2 pffs each am     08/29/2017  f/u ov/Wert re:  GOLD III/ 02 dep hs with cpap and  ? With exertion  Chief Complaint  Patient presents with  . Follow-up    Breathing is some better but not back to baseline.  She has had sore throat and increased cough for the past few days. Cough has been non prod.    Dyspnea:  MMRC3 = some better but still  can't walk 100 yards  even at a slow pace at a flat grade s stopping due to sob but admits not good about checking sats when walking    Cough: dry day > noct new x sev months assoc with hoarseness not post nasal drip/ wheeze  SABA use: none 02: 2lpm at hs and prn daytime  rec Try prilosec otc 20mg   Take 30-60 min before first meal of the day and Pepcid ac (famotidine) 20 mg one @  bedtime until cough is completely gone for at least a week   Adjust 02 to sats > 90% at all times especially while walking to help you burn fat and get into negative balance      12/04/2017  f/u ov/Wert re:  GOLD III / 02 dep hs at 2lpm and 2lpm with ex bike Chief Complaint  Patient presents with  . Follow-up    COPD GOLD III  Dyspnea: ex bike  3 x weekly x 10 min s stopping and w/in 5 min needs 02 if not starting out with it  Cough: resolved  Sleeping: bed is flat/ L side down/ one  pillow/ cpap per turner  SABA use: none on stiolto 2 each am  02: as above   rec Work on maintaining perfect  inhaler technique  No change in medications Adjust your 02 upwards as needed to maintain 02 sats well above 90% while exercising     06/26/2018  f/u ov/Wert re:  COPD III/ 02 dep at hs and with exertion/ maint stiolto  Chief Complaint  Patient presents with  . Follow-up    Breathing is about the same. No new co's. She has no rescue inhaler.   Dyspnea:  Bike sev times a week x 5 min x 2 so total of 10 min not monitoring sats as rec Cough: no Sleeping: flat bed on side/ one pillow SABA use: none 02: 2lpm at hs  No flares since last ov  rec No change rx    12/31/2018  f/u ov/Wert re:  COPD III/ 02 hs/ stiolto maint  Chief Complaint  Patient presents with  . Follow-up    Patient is here for a 6 month follow up and has no complaints at this time. Patient expresses COPD is about the same  Dyspnea:  Limited by back  Cough: none   SABA use: none  02: 2lpm hs only   No obvious day to day or daytime variability or assoc excess/ purulent sputum or mucus plugs or hemoptysis or cp or chest tightness, subjective wheeze or overt sinus or hb symptoms.   Sleeping on cpap on side / 1 pillow without nocturnal  or early am exacerbation  of respiratory  c/o's or need for noct saba. Also denies any obvious fluctuation of symptoms with weather or environmental changes or other aggravating or alleviating factors except as outlined above   No unusual exposure hx or h/o childhood pna/ asthma or knowledge of premature birth.  Current Allergies, Complete Past Medical History, Past Surgical History, Family History, and Social History were reviewed in [employer] record.  ROS  The following are not active complaints unless bolded Hoarseness, sore throat, dysphagia, dental problems, itching, sneezing,  nasal congestion or discharge of excess mucus or purulent secretions,  ear ache,   fever, chills, sweats, unintended wt loss or wt gain, classically pleuritic or exertional cp,  orthopnea pnd or arm/hand swelling  or leg swelling better, presyncope, palpitations, abdominal pain, anorexia, nausea, vomiting, diarrhea  or change in bowel  habits or change in bladder habits, change in stools or change in urine, dysuria, hematuria,  rash, arthralgias, visual complaints, headache, numbness, weakness or ataxia or problems with walking or coordination,  change in mood or  memory.        Current Meds  Medication Sig  . aspirin 81 MG tablet Take 81 mg by mouth at bedtime.   Marland Kitchen atorvastatin (LIPITOR) 20 MG tablet TAKE 1 TABLET BY MOUTH EVERY DAY AT 6 PM (Patient taking differently: 10 mg. )  . carvedilol (COREG) 12.5 MG tablet TAKE 1 TABLET TWICE DAILY WITH A MEAL  . Cholecalciferol (VITAMIN D-3) 5000 units TABS Take 1 capsule by mouth daily.  Marland Kitchen FARXIGA 10 MG TABS tablet Take 10 mg by mouth daily.  Marland Kitchen gabapentin (NEURONTIN) 400 MG capsule Take 2 tablets by mouth in the AM and 2 by mouth in the PM  . levothyroxine (SYNTHROID, LEVOTHROID) 75 MCG tablet Take 75 mcg by mouth daily.  . Multiple Vitamin (MULTIVITAMIN WITH MINERALS) TABS tablet Take 1 tablet by mouth daily.  . OXYGEN 2lpm 2 lpm as needed  . sacubitril-valsartan (ENTRESTO) 97-103 MG Take 1 tablet by mouth 2 (two) times daily.  . Tiotropium Bromide-Olodaterol (STIOLTO RESPIMAT) 2.5-2.5 MCG/ACT AERS Inhale 2 puffs into the lungs daily.  Marland Kitchen tiZANidine (ZANAFLEX) 4 MG tablet Take 4 mg by mouth daily.  . traMADol (ULTRAM) 50 MG tablet Take 1 tablet by mouth every 6 (six) hours as needed.  Marland Kitchen UNABLE TO FIND CPAP with o2 2lpm  DME- [facility]                         Objective:   Physical Exam  amb obese walked in s rollator    12/31/2018  293  11/27/2013     237 >   01/08/2014  236 > 01/21/2014 241 >235 02/21/2014 > 02/26/2014  232 > 03/27/2014  236 >236 04/10/2014 > 05/22/2014   237 >  08/21/2014 245 > 10/09/2014 257 >   01/12/2015   268 > 07/14/2015  278 > 01/14/2016  292  > 07/18/2017   301  > 08/29/2017  303 > 12/04/2017  298 > 06/26/2018  291   BP 130/68 (BP Location: Left Arm, Patient Position: Sitting, Cuff Size: Normal)   Pulse 86   Temp (!) 97 F (36.1 C) (Temporal)   Ht 5\' 5"  (1.651 m)   Wt 293 lb 3.2 oz (133 kg)   SpO2 92% Comment: on RA  BMI 48.79 kg/m        HEENT : pt wearing mask not removed for exam due to covid -19 concerns.    NECK :  without JVD/Nodes/TM/ nl carotid upstrokes bilaterally   LUNGS: no acc muscle use,  Nl contour chest with distant bs  bilaterally without wheeze or  cough on insp or exp maneuvers   CV:  RRR  no s3 or murmur or increase in P2, and no edema   ABD: quite obese but soft and nontender with nl inspiratory excursion in the supine position. No bruits or organomegaly appreciated, bowel sounds nl  MS:  Nl gait/ ext warm without deformities, calf tenderness, cyanosis or clubbing No obvious joint restrictions   SKIN: warm and dry without lesions    NEURO:  alert, approp, nl sensorium with  no motor or cerebellar deficits apparent.       Assessment:

## 2019-01-01 ENCOUNTER — Encounter: Payer: Self-pay | Admitting: Internal Medicine

## 2019-01-01 DIAGNOSIS — G4733 Obstructive sleep apnea (adult) (pediatric): Secondary | ICD-10-CM | POA: Diagnosis not present

## 2019-01-01 NOTE — Assessment & Plan Note (Signed)
Quit smoking around 2007  - 11/27/2013  PFTs   FEV1  1.51 (54%) ratio 52 and and no better p B2 and DLCO  71 - 01/08/2014  p extensive coaching HFA effectiveness =    90% > rec resume symbicort 160 2bid  - 03/27/14 trial of dulera 200/tudorza samples only to assure adherence  04/10/2014   Alpha 1 MM , nml level  - trial off spiriva 05/22/14 / off all resp rx 08/22/14 - PFT's  10/09/2014  FEV1 1.64 (59 % ) ratio 54  p 11 % improvement from saba with DLCO  69 % corrects to 73  % for alv volume   - 07/14/2015  A  > try spiriva 2 puffs each am > no better so pt d/c'd  - PFT's  07/18/2017  FEV1 1.10 (41 % ) ratio 46   p 19 % improvement from saba p nothing prior to study with DLCO  70 % corrects to 75  % for alv volume  On coreg  - 07/18/2017    try stiolto  - Spirometry 12/04/2017  FEV1 1.3 (49%)  Ratio 49 p am stiolto x 2 puffs  w classic curvature - 06/26/2018  After extensive coaching inhaler device,  effectiveness =   90%   Pt is Group B in terms of symptom/risk and laba/lama therefore appropriate rx at this point >>>  Continue stiolto  Advised:  formulary restrictions will be an ongoing challenge for the forseable future and I would be happy to pick an alternative if the pt will first  provide me a list of them -  pt  will need to return here for training for any new device that is required eg dpi vs hfa vs respimat.    In the meantime we can always provide samples so that the patient never runs out of any needed respiratory medications.   Pt informed of the seriousness of COVID 19 infection as a direct risk to their health  and safey and to those of their loved ones and should continue to wear facemask in public and minimize exposure to public locations but especially avoid any area or activity where non-close contacts are not observing distancing or wearing an appropriate face mask.

## 2019-01-01 NOTE — Assessment & Plan Note (Addendum)
Walk with desats 88% 04/10/2014 >begin O2 w/ act 2 l/m  -07/14/2015  Walked RA x 3 laps @ 185 ft each stopped due to end of study, nl pace, no significant desat or sob. > hs 02 only  - ono on 2lpm/cpap  07/20/2017   desats < 89% x 2.4 min   Adequate control on present rx, reviewed in detail with pt > no change in rx needed  >>> no need for daytime 02 at this point   F/u can be q 6 m, sooner if needed   I had an extended discussion with the patient reviewing all relevant studies completed to date and  lasting 15 to 20 minutes of a 25 minute visit    Each maintenance medication was reviewed in detail including most importantly the difference between maintenance and prns and under what circumstances the prns are to be triggered using an action plan format that is not reflected in the computer generated alphabetically organized AVS.     Please see AVS for specific instructions unique to this visit that I personally wrote and verbalized to the the pt in detail and then reviewed with pt  by my nurse highlighting any  changes in therapy recommended at today's visit to their plan of care.

## 2019-01-10 DIAGNOSIS — J449 Chronic obstructive pulmonary disease, unspecified: Secondary | ICD-10-CM | POA: Diagnosis not present

## 2019-02-09 DIAGNOSIS — J449 Chronic obstructive pulmonary disease, unspecified: Secondary | ICD-10-CM | POA: Diagnosis not present

## 2019-02-19 DIAGNOSIS — E119 Type 2 diabetes mellitus without complications: Secondary | ICD-10-CM | POA: Diagnosis not present

## 2019-03-05 ENCOUNTER — Other Ambulatory Visit: Payer: Self-pay | Admitting: Internal Medicine

## 2019-03-05 DIAGNOSIS — J449 Chronic obstructive pulmonary disease, unspecified: Secondary | ICD-10-CM

## 2019-03-12 DIAGNOSIS — J449 Chronic obstructive pulmonary disease, unspecified: Secondary | ICD-10-CM | POA: Diagnosis not present

## 2019-03-15 ENCOUNTER — Other Ambulatory Visit: Payer: Self-pay | Admitting: Cardiology

## 2019-04-04 DIAGNOSIS — G4733 Obstructive sleep apnea (adult) (pediatric): Secondary | ICD-10-CM | POA: Diagnosis not present

## 2019-04-12 DIAGNOSIS — J4520 Mild intermittent asthma, uncomplicated: Secondary | ICD-10-CM | POA: Diagnosis not present

## 2019-04-12 DIAGNOSIS — J449 Chronic obstructive pulmonary disease, unspecified: Secondary | ICD-10-CM | POA: Diagnosis not present

## 2019-04-29 ENCOUNTER — Other Ambulatory Visit: Payer: Self-pay | Admitting: Cardiology

## 2019-05-10 DIAGNOSIS — J449 Chronic obstructive pulmonary disease, unspecified: Secondary | ICD-10-CM | POA: Diagnosis not present

## 2019-05-10 DIAGNOSIS — J4520 Mild intermittent asthma, uncomplicated: Secondary | ICD-10-CM | POA: Diagnosis not present

## 2019-06-10 DIAGNOSIS — J4520 Mild intermittent asthma, uncomplicated: Secondary | ICD-10-CM | POA: Diagnosis not present

## 2019-06-10 DIAGNOSIS — J449 Chronic obstructive pulmonary disease, unspecified: Secondary | ICD-10-CM | POA: Diagnosis not present

## 2019-07-01 ENCOUNTER — Ambulatory Visit: Payer: Medicare HMO | Admitting: Internal Medicine

## 2019-07-03 DIAGNOSIS — G4733 Obstructive sleep apnea (adult) (pediatric): Secondary | ICD-10-CM | POA: Diagnosis not present

## 2019-07-08 ENCOUNTER — Other Ambulatory Visit: Payer: Self-pay

## 2019-07-08 ENCOUNTER — Ambulatory Visit: Payer: Medicare HMO | Admitting: Internal Medicine

## 2019-07-08 ENCOUNTER — Encounter: Payer: Self-pay | Admitting: Internal Medicine

## 2019-07-08 DIAGNOSIS — J449 Chronic obstructive pulmonary disease, unspecified: Secondary | ICD-10-CM

## 2019-07-08 DIAGNOSIS — J9611 Chronic respiratory failure with hypoxia: Secondary | ICD-10-CM

## 2019-07-08 NOTE — Patient Instructions (Signed)
No change in medications   Please schedule a follow up visit in 12  months but call sooner if needed  

## 2019-07-08 NOTE — Progress Notes (Signed)
Subjective:     Patient ID: Judy [name], female   DOB: 1960/08/03,  MRN: [id number]    Brief patient profile:  58 yowf quit smoking 2007  @  150 lb  with cough that resolved and no resp problems until winter 2015 with doe x walking the dog s much in terms of cough then 10/04/13 sudden sense she couldn't get a breath and coughed up blood x one tsp plus slt green mucus  > better with saba and started on inhalers/ abx  >  better and pred taper and dx of GOLD II copd was made 11/2013 and non ischemic cardiomyopathy 05/14/14.     History of Present Illness  10/08/2013 1st [facility] office visit/ Tyrece Vanterpool Chief Complaint  Patient presents with  . Pulmonary Consult    Referred by Penni Homans Cox-sob with exertion x 6-8 mths.,worse since 4 days ago,occass. cough-tsp. of blood 4 days ago,usually unprod.,no wheezing,midchest tightness,no fcs,Had neb. since yesterday(used 2x yesterday)   baseline = 153ft   to mailbox and sob when gets there.  rec Clonidine 0.1 mg twice daily until you return  Plan A = automatic = symbiocort 160 Take 2 puffs first thing in am and then another 2 puffs about 12 hours later.  Plan B = Only use your albuterol (proair)as a rescue medication    Plan C = nebulizer albuterol, ok to use up to every 4 hours if can't get relief from Plan B GERD diet     LHC 05/14/14  1. No significant coronary artery disease 2. Moderate to severely reduced LV systolic function due to nonischemic cardiomyopathy.  3. Moderately elevated left ventricular end-diastolic pressure.    07/18/2017 acute extended ov/Xadrian Craighead re: re-establish for sob  Chief Complaint  Patient presents with  . Acute Visit    Increased SOB x 6-8 wks.  She states she is getting SOB just walking from room to room at home.   All started p abruptly became  light headed about 6 wk prior to OV  > bp was low > adjustments made to bp meds (decrease lasix and coreg) then sob worse s cough and now doe = MMRC4  = sob if tries  to leave home or while getting dressed some better when wears 02 but not monitoring daytime sats   02 is 2lpm with cpap and prn daytime  no noct symtpoms on cpap flat > feels rested rec Goal is to keep 02 sats over 90% at all times so take it with activity if needed and turn 02 up to achieve this goal  Continue lasix as needed to control feet swelling  Please see patient coordinator before you leave today  to schedule overnight  Oxymetry on 02 an cpap Please remember to go to the  x-ray department downstairs in the basement  for your tests - we will call you with the results when they are available.  Please schedule a follow up office visit in 6 weeks, call sooner if needed  - add stiolto 2 pffs each am     08/29/2017  f/u ov/Snyder Colavito re:  GOLD III/ 02 dep hs with cpap and  ? With exertion  Chief Complaint  Patient presents with  . Follow-up    Breathing is some better but not back to baseline.  She has had sore throat and increased cough for the past few days. Cough has been non prod.    Dyspnea:  MMRC3 = some better but still  can't walk 100 yards  even at a slow pace at a flat grade s stopping due to sob but admits not good about checking sats when walking    Cough: dry day > noct new x sev months assoc with hoarseness not post nasal drip/ wheeze  SABA use: none 02: 2lpm at hs and prn daytime  rec Try prilosec otc 20mg   Take 30-60 min before first meal of the day and Pepcid ac (famotidine) 20 mg one @  bedtime until cough is completely gone for at least a week   Adjust 02 to sats > 90% at all times especially while walking to help you burn fat and get into negative balance      12/04/2017  f/u ov/Colleene Swarthout re:  GOLD III / 02 dep hs at 2lpm and 2lpm with ex bike Chief Complaint  Patient presents with  . Follow-up    COPD GOLD III  Dyspnea: ex bike  3 x weekly x 10 min s stopping and w/in 5 min needs 02 if not starting out with it  Cough: resolved  Sleeping: bed is flat/ L side down/ one  pillow/ cpap per turner  SABA use: none on stiolto 2 each am  02: as above   rec Work on maintaining perfect  inhaler technique  No change in medications Adjust your 02 upwards as needed to maintain 02 sats well above 90% while exercising      07/08/2019  f/u ov/Zalyn Amend re:  COPD III/ 02 hs only/ maint stiolto Chief Complaint  Patient presents with  . Follow-up    Breathing is unchanged. No new co's today.   Dyspnea:  Room to room  Cough: none Sleeping: on side / one pillow SABA use: no need while on stiolto 02: 2lpm hs only    No obvious day to day or daytime variability or assoc excess/ purulent sputum or mucus plugs or hemoptysis or cp or chest tightness, subjective wheeze or overt sinus or hb symptoms.   Sleeping without nocturnal  or early am exacerbation  of respiratory  c/o's or need for noct saba. Also denies any obvious fluctuation of symptoms with weather or environmental changes or other aggravating or alleviating factors except as outlined above   No unusual exposure hx or h/o childhood pna/ asthma or knowledge of premature birth.  Current Allergies, Complete Past Medical History, Past Surgical History, Family History, and Social History were reviewed in 07/10/2019 record.  ROS  The following are not active complaints unless bolded Hoarseness, sore throat, dysphagia, dental problems, itching, sneezing,  nasal congestion or discharge of excess mucus or purulent secretions, ear ache,   fever, chills, sweats, unintended wt loss or wt gain, classically pleuritic or exertional cp,  orthopnea pnd or arm/hand swelling  or leg swelling, presyncope, palpitations, abdominal pain, anorexia, nausea, vomiting, diarrhea  or change in bowel habits or change in bladder habits, change in stools or change in urine, dysuria, hematuria,  rash, arthralgias, visual complaints, headache, numbness, weakness or ataxia or problems with walking due to back pain  or coordination,   change in mood or  memory.        Current Meds  Medication Sig  . aspirin 81 MG tablet Take 81 mg by mouth at bedtime.   [employer] atorvastatin (LIPITOR) 20 MG tablet TAKE 1 TABLET BY MOUTH EVERY DAY AT 6 PM (Patient taking differently: 10 mg. )  . carvedilol (COREG) 12.5 MG tablet TAKE 1 TABLET TWICE DAILY WITH A MEAL  . Cholecalciferol (VITAMIN  D-3) 5000 units TABS Take 1 capsule by mouth daily.  . Coenzyme Q10 (CO Q 10) 100 MG CAPS Take 200 mg by mouth daily.  Marland Kitchen FARXIGA 10 MG TABS tablet Take 10 mg by mouth daily.  Marland Kitchen gabapentin (NEURONTIN) 400 MG capsule Take 2 tablets by mouth in the AM and 2 by mouth in the PM  . levothyroxine (SYNTHROID, LEVOTHROID) 75 MCG tablet Take 75 mcg by mouth daily.  . Lutein 20 MG TABS Take 1 tablet by mouth daily.  . Multiple Vitamin (MULTIVITAMIN WITH MINERALS) TABS tablet Take 1 tablet by mouth daily.  . OXYGEN 2lpm 2 lpm as needed  . sacubitril-valsartan (ENTRESTO) 97-103 MG Take 1 tablet by mouth 2 (two) times daily.  Marland Kitchen STIOLTO RESPIMAT 2.5-2.5 MCG/ACT AERS INHALE 2 PUFFS INTO THE LUNGS DAILY.  Marland Kitchen tiZANidine (ZANAFLEX) 4 MG tablet Take 4 mg by mouth daily.  . traMADol (ULTRAM) 50 MG tablet Take 1 tablet by mouth every 6 (six) hours as needed.  Marland Kitchen UNABLE TO FIND CPAP with o2 2lpm  DME- [facility]                            Objective:   Physical Exam  amb obese wf nad / walks with high walker   07/08/2019     295  12/31/2018   293  11/27/2013     237 >   01/08/2014  236 > 01/21/2014 241 >235 02/21/2014 > 02/26/2014  232 > 03/27/2014  236 >236 04/10/2014 > 05/22/2014   237 >  08/21/2014 245 > 10/09/2014 257 >  01/12/2015   268 > 07/14/2015  278 > 01/14/2016  292  > 07/18/2017   301  > 08/29/2017  303 > 12/04/2017  298 > 06/26/2018  291   Vital signs reviewed  07/08/2019  - Note at rest 02 sats  95% on RA   HEENT : pt wearing mask not removed for exam due to covid -19 concerns.    NECK :  without JVD/Nodes/TM/ nl carotid upstrokes bilaterally   LUNGS: no acc muscle  use,  Nl contour chest which is clear to A and P bilaterally without cough on insp or exp maneuvers   CV:  RRR  no s3 or murmur or increase in P2, and trace pitting both LE  edema   ABD:  Quite obese soft and nontender with limited inspiratory excursion   No bruits or organomegaly appreciated, bowel sounds nl  MS:  Nl gait/ ext warm without deformities, calf tenderness, cyanosis or clubbing No obvious joint restrictions   SKIN: warm and dry without lesions    NEURO:  alert, approp, nl sensorium with  no motor or cerebellar deficits apparent.          Assessment:

## 2019-07-09 ENCOUNTER — Encounter: Payer: Self-pay | Admitting: Internal Medicine

## 2019-07-09 NOTE — Assessment & Plan Note (Addendum)
Body mass index is 49.09 kg/m.  -  trending still up  Lab Results  Component Value Date   TSH 2.110 06/12/2017     Contributing to gerd risk/ doe/reviewed the need and the process to achieve and maintain neg calorie balance > defer f/u primary care including intermittently monitoring thyroid status

## 2019-07-09 NOTE — Assessment & Plan Note (Signed)
Walk with desats 88% 04/10/2014 >begin O2 w/ act 2 l/m  -07/14/2015  Walked RA x 3 laps @ 185 ft each stopped due to end of study, nl pace, no significant desat or sob. > hs 02 only  - ono on 2lpm/cpap  07/20/2017   desats < 89% x 2.4 min  Adequate control on present rx, reviewed in detail with pt > no change in rx needed           Each maintenance medication was reviewed in detail including emphasizing most importantly the difference between maintenance and prns and under what circumstances the prns are to be triggered using an action plan format where appropriate.  Total time for H and P, chart review, counseling, teaching device and generating customized AVS unique to this office visit / charting = 20 min   >>> f/u yearly at this point, sooner prn

## 2019-07-09 NOTE — Assessment & Plan Note (Signed)
Quit smoking around 2007  - 11/27/2013  PFTs   FEV1  1.51 (54%) ratio 52 and and no better p B2 and DLCO  71 - 01/08/2014  p extensive coaching HFA effectiveness =    90% > rec resume symbicort 160 2bid  - 03/27/14 trial of dulera 200/tudorza samples only to assure adherence  04/10/2014   Alpha 1 MM , nml level  - trial off spiriva 05/22/14 / off all resp rx 08/22/14 - PFT's  10/09/2014  FEV1 1.64 (59 % ) ratio 54  p 11 % improvement from saba with DLCO  69 % corrects to 73  % for alv volume   - 07/14/2015  A  > try spiriva 2 puffs each am > no better so pt d/c'd  - PFT's  07/18/2017  FEV1 1.10 (41 % ) ratio 46   p 19 % improvement from saba p nothing prior to study with DLCO  70 % corrects to 75  % for alv volume  On coreg  - 07/18/2017    try stiolto  - Spirometry 12/04/2017  FEV1 1.3 (49%)  Ratio 49 p am stiolto x 2 puffs  w classic curvature - 06/26/2018  After extensive coaching inhaler device,  effectiveness =   90%    Pt is Group B in terms of symptom/risk and laba/lama therefore appropriate rx at this point >>>  Continue stiolto      

## 2019-07-10 DIAGNOSIS — J4520 Mild intermittent asthma, uncomplicated: Secondary | ICD-10-CM | POA: Diagnosis not present

## 2019-07-10 DIAGNOSIS — J449 Chronic obstructive pulmonary disease, unspecified: Secondary | ICD-10-CM | POA: Diagnosis not present

## 2019-07-17 ENCOUNTER — Encounter (INDEPENDENT_AMBULATORY_CARE_PROVIDER_SITE_OTHER): Payer: Self-pay | Admitting: Bariatrics

## 2019-07-17 ENCOUNTER — Ambulatory Visit (INDEPENDENT_AMBULATORY_CARE_PROVIDER_SITE_OTHER): Payer: Medicare HMO | Admitting: Bariatrics

## 2019-07-17 ENCOUNTER — Other Ambulatory Visit: Payer: Self-pay

## 2019-07-17 VITALS — BP 121/57 | HR 64 | Temp 97.7°F | Ht 66.0 in | Wt 291.0 lb

## 2019-07-17 DIAGNOSIS — R7309 Other abnormal glucose: Secondary | ICD-10-CM | POA: Diagnosis not present

## 2019-07-17 DIAGNOSIS — J449 Chronic obstructive pulmonary disease, unspecified: Secondary | ICD-10-CM | POA: Diagnosis not present

## 2019-07-17 DIAGNOSIS — Z6841 Body Mass Index (BMI) 40.0 and over, adult: Secondary | ICD-10-CM

## 2019-07-17 DIAGNOSIS — R0602 Shortness of breath: Secondary | ICD-10-CM | POA: Diagnosis not present

## 2019-07-17 DIAGNOSIS — R0600 Dyspnea, unspecified: Secondary | ICD-10-CM | POA: Diagnosis not present

## 2019-07-17 DIAGNOSIS — I10 Essential (primary) hypertension: Secondary | ICD-10-CM

## 2019-07-17 DIAGNOSIS — Z1331 Encounter for screening for depression: Secondary | ICD-10-CM

## 2019-07-17 DIAGNOSIS — E781 Pure hyperglyceridemia: Secondary | ICD-10-CM | POA: Diagnosis not present

## 2019-07-17 DIAGNOSIS — I509 Heart failure, unspecified: Secondary | ICD-10-CM

## 2019-07-17 DIAGNOSIS — R5383 Other fatigue: Secondary | ICD-10-CM

## 2019-07-17 DIAGNOSIS — G4733 Obstructive sleep apnea (adult) (pediatric): Secondary | ICD-10-CM

## 2019-07-17 DIAGNOSIS — E7849 Other hyperlipidemia: Secondary | ICD-10-CM

## 2019-07-17 DIAGNOSIS — E559 Vitamin D deficiency, unspecified: Secondary | ICD-10-CM | POA: Diagnosis not present

## 2019-07-17 DIAGNOSIS — E119 Type 2 diabetes mellitus without complications: Secondary | ICD-10-CM

## 2019-07-17 DIAGNOSIS — Z0289 Encounter for other administrative examinations: Secondary | ICD-10-CM

## 2019-07-17 NOTE — Progress Notes (Signed)
Chief Complaint:   OBESITY Judy [name] (MR# [id number]) is a 59 y.o. female who presents for evaluation and treatment of obesity and related comorbidities. Current BMI is Body mass index is 46.97 kg/m.Marland Kitchen Judy [name] has been struggling with her weight for many years and has been unsuccessful in either losing weight, maintaining weight loss, or reaching her healthy weight goal.  Judy [name] is currently in the action stage of change and ready to dedicate time achieving and maintaining a healthier weight. Judy [name] is interested in becoming our patient and working on intensive lifestyle modifications including (but not limited to) diet and exercise for weight loss.  Judy [name] eats out 4-5 times a week. She likes to cook when she has the energy. She states that she is a picky eater. She eats at night.  Judy [name]'s habits were reviewed today and are as follows: Her family sometimes eats meals together, she thinks her family will eat healthier with her, her desired weight loss is 146 lbs, she has been heavy most of her life, she started gaining weight in 2010/2011, her heaviest weight ever was 291 pounds, she is a picky eater and doesn't like to eat healthier foods, she craves sweets and bread, she snacks frequently in the evenings, she frequently makes poor food choices, she has problems with excessive hunger, she frequently eats larger portions than normal, she has binge eating behaviors and she struggles with emotional eating.  Depression Screen Judy [name]'s Food and Mood (modified PHQ-9) score was 18.  Depression screen Judy [name] 2/9 07/17/2019  Decreased Interest 3  PHQ - 2 Score 3  Altered sleeping 3  Tired, decreased energy 3  Change in appetite 3  Feeling bad or failure about yourself  3  Trouble concentrating 3  Moving slowly or fidgety/restless 0  Suicidal thoughts 0  PHQ-9 Score 18  Difficult doing work/chores Very difficult   Subjective:   Other fatigue. Judy [name] denies daytime somnolence and admits to  waking up still tired. Judy [name] generally gets 6-7 hours of sleep per night, and states that she has generally restful sleep. Snoring is present. Apneic episodes are not present. Epworth Sleepiness Score is 9.  SOB (shortness of breath) on exertion. Judy [name] notes increasing shortness of breath with certain activities and seems to be worsening over time with weight gain. She notes getting out of breath sooner with activity than she used to. This has gotten worse recently. Judy [name] denies shortness of breath at rest or orthopnea.  Essential hypertension. Judy [name] takes Coreg. Blood pressure is controlled.  BP Readings from Last 3 Encounters:  07/17/19 (!) 121/57  07/08/19 132/80  12/31/18 130/68   Lab Results  Component Value Date   CREATININE 0.70 09/24/2018   CREATININE 0.60 06/12/2017   CREATININE 0.61 12/05/2016   OSA (obstructive sleep apnea). Judy [name] wears CPAP and reports restful sleep.  Congestive heart failure, unspecified HF chronicity, unspecified heart failure type (HCC). CHF is class II. Judy [name] is taking Entresto.  Chronic obstructive pulmonary disease, unspecified COPD type (HCC). Judy [name] is taking Stiolto Respimat and uses oxygen at night 2 liters per minute.  Other hyperlipidemia. Judy [name] is taking Lipitor.   Lab Results  Component Value Date   CHOL 146 07/12/2016   HDL 41 07/12/2016   LDLCALC 75 07/12/2016   TRIG 151 (H) 07/12/2016   CHOLHDL 3.6 07/12/2016   Lab Results  Component Value Date   ALT 40 09/24/2018   AST 28 09/24/2018   ALKPHOS 177 (H) 09/24/2018   BILITOT 0.8 09/24/2018  The ASCVD Risk score Judy [name]., et al., 2013) failed to calculate for the following reasons:   Cannot find a previous HDL lab   Cannot find a previous total cholesterol lab  Vitamin D deficiency. Judy [name] is taking Vitamin D.  Type 2 diabetes mellitus without complication, without long-term current use of insulin (HCC). Diabetes was diagnosed 1 year ago. She is taking [place].  Lab Results    Component Value Date   HGBA1C 7.1 (H) 07/12/2016   Lab Results  Component Value Date   LDLCALC 75 07/12/2016   CREATININE 0.70 09/24/2018   No results found for: INSULIN  Depression screen. Judy [name] has a strongly positive depression screen with a PHQ-9 score of 18.  Assessment/Plan:   Other fatigue. Judy [name] does feel that her weight is causing her energy to be lower than it should be. Fatigue may be related to obesity, depression or many other causes. Labs will be ordered, and in the meanwhile, Judy [name] will focus on self care including making healthy food choices, increasing physical activity and focusing on stress reduction. EKG 12-Lead, T3, T4, free, TSH  SOB (shortness of breath) on exertion. Judy [name] does feel that she gets out of breath more easily that she used to when she exercises. Judy [name]'s shortness of breath appears to be obesity related and exercise induced. She has agreed to work on weight loss and gradually increase exercise to treat her exercise induced shortness of breath. Will continue to monitor closely.  Essential hypertension. Judy [name] is working on healthy weight loss and exercise to improve blood pressure control. We will watch for signs of hypotension as she continues her lifestyle modifications. She will continue her medication as directed. Comprehensive metabolic panel, Lipid Panel With LDL/HDL Ratio labs ordered today.  OSA (obstructive sleep apnea). Intensive lifestyle modifications are the first line treatment for this issue. We discussed several lifestyle modifications today and she will continue to work on diet, exercise and weight loss efforts. We will continue to monitor. Orders and follow up as documented in patient record. Judy [name] will continue using CPAP.  Counseling  Sleep apnea is a condition in which breathing pauses or becomes shallow during sleep. This happens over and over during the night. This disrupts your sleep and keeps your body from getting the rest that it  needs, which can cause tiredness and lack of energy (fatigue) during the day.  Sleep apnea treatment: If you were given a device to open your airway while you sleep, USE IT!  Sleep hygiene:   Limit or avoid alcohol, caffeinated beverages, and cigarettes, especially close to bedtime.   Do not eat a large meal or eat spicy foods right before bedtime. This can lead to digestive discomfort that can make it hard for you to sleep.  Keep a sleep diary to help you and your [name] care provider figure out what could be causing your insomnia.  . Make your bedroom a dark, comfortable place where it is easy to fall asleep. ? Put up shades or blackout curtains to block light from outside. ? Use a white noise machine to block noise. ? Keep the temperature cool. . Limit screen use before bedtime. This includes: ? Watching TV. ? Using your smartphone, tablet, or computer. . Stick to a routine that includes going to bed and waking up at the same times every day and night. This can help you fall asleep faster. Consider making a quiet activity, such as reading, part of your nighttime routine. . Try to avoid taking  naps during the day so that you sleep better at night. . Get out of bed if you are still awake after 15 minutes of trying to sleep. Keep the lights down, but try reading or doing a quiet activity. When you feel sleepy, go back to bed.  Congestive heart failure, unspecified HF chronicity, unspecified heart failure type (HCC). Judy [name] will follow-up with her cardiologist in 6 months to 1 year.  Chronic obstructive pulmonary disease, unspecified COPD type (HCC). Judy [name] will continue her medication as prescribed.  Other hyperlipidemia. Cardiovascular risk and specific lipid/LDL goals reviewed.  We discussed several lifestyle modifications today and Judy [name] will continue to work on diet, exercise and weight loss efforts. Orders and follow up as documented in patient record. Judy [name] will continue her medication  as directed.   Counseling Intensive lifestyle modifications are the first line treatment for this issue. . Dietary changes: Increase soluble fiber. Decrease simple carbohydrates. . Exercise changes: Moderate to vigorous-intensity aerobic activity 150 minutes per week if tolerated. . Lipid-lowering medications: see documented in medical record.  Vitamin D deficiency. Low Vitamin D level contributes to fatigue and are associated with obesity, breast, and colon cancer. VITAMIN D 25 Hydroxy (Vit-D Deficiency, Fractures) level ordered today.  Type 2 diabetes mellitus without complication, without long-term current use of insulin (HCC). Good blood sugar control is important to decrease the likelihood of diabetic complications such as nephropathy, neuropathy, limb loss, blindness, coronary artery disease, and death. Intensive lifestyle modification including diet, exercise and weight loss are the first line of treatment for diabetes. Judy [name] will continue [place] as directed. Hemoglobin A1c, Insulin, random labs ordered today.  Depression screen. Judy [name] had a positive depression screening. Depression is commonly associated with obesity and often results in emotional eating behaviors. We will monitor this closely and work on CBT to help improve the non-hunger eating patterns. Referral to Psychology may be required if no improvement is seen as she continues in our clinic.  Class 3 severe obesity with serious comorbidity and body mass index (BMI) of 45.0 to 49.9 in adult, unspecified obesity type (HCC).  Judy [name] is currently in the action stage of change and her goal is to continue with weight loss efforts. I recommend Judy [name] begin the structured treatment plan as follows:  She has agreed to the Category 4 Plan.  She will work on meal planning and intentional eating.   We independently reviewed with the patient labs including CMP, CBC, and glucose.  Exercise goals: All adults should avoid inactivity. Some  physical activity is better than none, and adults who participate in any amount of physical activity gain some [name] benefits.   Behavioral modification strategies: increasing lean protein intake, decreasing simple carbohydrates, increasing vegetables, increasing water intake, decreasing eating out, no skipping meals, meal planning and cooking strategies, keeping healthy foods in the home and planning for success.  She was informed of the importance of frequent follow-up visits to maximize her success with intensive lifestyle modifications for her multiple [name] conditions. She was informed we would discuss her lab results at her next visit unless there is a critical issue that needs to be addressed sooner. Judy [name] agreed to keep her next visit at the agreed upon time to discuss these results.  Objective:   Blood pressure (!) 121/57, pulse 64, temperature 97.7 F (36.5 C), temperature source Oral, height 5\' 6"  (1.676 m), weight 291 lb (132 kg), SpO2 97 %. Body mass index is 46.97 kg/m.  EKG: Sinus  Rhythm with a rate of 65  BPM. First degree A-V block. PRi = 230. Nonspecific T-abnormality. Low voltage - possible pulmonary disease. Otherwise normal.   Indirect Calorimeter completed today shows a VO2 of 336 and a REE of 2332.  Her calculated basal metabolic rate is 3419 thus her basal metabolic rate is better than expected.  General: Cooperative, alert, well developed, in no acute distress. HEENT: Conjunctivae and lids unremarkable. Cardiovascular: Regular rhythm.  Lungs: Normal work of breathing. Neurologic: No focal deficits. Pt uses an "up walker."  Lab Results  Component Value Date   CREATININE 0.70 09/24/2018   BUN 10 09/24/2018   NA 141 09/24/2018   K 4.2 09/24/2018   CL 101 09/24/2018   CO2 27 09/24/2018   Lab Results  Component Value Date   ALT 40 09/24/2018   AST 28 09/24/2018   ALKPHOS 177 (H) 09/24/2018   BILITOT 0.8 09/24/2018   Lab Results  Component Value Date    HGBA1C 7.1 (H) 07/12/2016   No results found for: INSULIN Lab Results  Component Value Date   TSH 2.110 06/12/2017   Lab Results  Component Value Date   CHOL 146 07/12/2016   HDL 41 07/12/2016   LDLCALC 75 07/12/2016   TRIG 151 (H) 07/12/2016   CHOLHDL 3.6 07/12/2016   Lab Results  Component Value Date   WBC 12.3 (H) 09/24/2018   HGB 15.1 (H) 09/24/2018   HCT 47.2 (H) 09/24/2018   MCV 94.0 09/24/2018   PLT 244 09/24/2018   Lab Results  Component Value Date   FERRITIN 41 12/31/2014   Obesity Behavioral Intervention Visit Documentation for Insurance:   Approximately 15 minutes were spent on the discussion below.  ASK: We discussed the diagnosis of obesity with Danyal today and Judy [name] agreed to give [place] permission to discuss obesity behavioral modification therapy today.  ASSESS: Judy [name] has the diagnosis of obesity and her BMI today is 47.1. Chameka is in the action stage of change.   ADVISE: Judy [name] was educated on the multiple [name] risks of obesity as well as the benefit of weight loss to improve her [name]. She was advised of the need for long term treatment and the importance of lifestyle modifications to improve her current [name] and to decrease her risk of future [name] problems.  AGREE: Multiple dietary modification options and treatment options were discussed and Judy [name] agreed to follow the recommendations documented in the above note.  ARRANGE: Judy [name] was educated on the importance of frequent visits to treat obesity as outlined per CMS and USPSTF guidelines and agreed to schedule her next follow up appointment today.  Attestation Statements:   Reviewed by clinician on day of visit: allergies, medications, problem list, medical history, surgical history, family history, social history, and previous encounter notes.  Migdalia Dk, am acting as Location manager for [employer], DO   I have reviewed the above documentation for accuracy and completeness, and I agree  with the above. Jearld Lesch, DO

## 2019-07-18 LAB — LIPID PANEL WITH LDL/HDL RATIO
Cholesterol, Total: 157 mg/dL (ref 100–199)
HDL: 43 mg/dL (ref >39)
LDL Chol Calc (NIH): 89 mg/dL (ref 0–99)
LDL/HDL Ratio: 2.1 ratio (ref 0.0–3.2)
Triglycerides: 143 mg/dL (ref 0–149)
VLDL Cholesterol Cal: 25 mg/dL (ref 5–40)

## 2019-07-18 LAB — COMPREHENSIVE METABOLIC PANEL
ALT: 46 IU/L — ABNORMAL HIGH (ref 0–32)
AST: 27 IU/L (ref 0–40)
Albumin/Globulin Ratio: 1.7 (ref 1.2–2.2)
Albumin: 4.3 g/dL (ref 3.8–4.9)
Alkaline Phosphatase: 221 IU/L — ABNORMAL HIGH (ref 48–121)
BUN/Creatinine Ratio: 15 (ref 9–23)
BUN: 10 mg/dL (ref 6–24)
Bilirubin Total: 0.8 mg/dL (ref 0.0–1.2)
CO2: 22 mmol/L (ref 20–29)
Calcium: 9.3 mg/dL (ref 8.7–10.2)
Chloride: 105 mmol/L (ref 96–106)
Creatinine, Ser: 0.68 mg/dL (ref 0.57–1.00)
GFR calc Af Amer: 111 mL/min/{1.73_m2} (ref >59)
GFR calc non Af Amer: 96 mL/min/{1.73_m2} (ref >59)
Globulin, Total: 2.5 g/dL (ref 1.5–4.5)
Glucose: 164 mg/dL — ABNORMAL HIGH (ref 65–99)
Potassium: 4.2 mmol/L (ref 3.5–5.2)
Sodium: 142 mmol/L (ref 134–144)
Total Protein: 6.8 g/dL (ref 6.0–8.5)

## 2019-07-18 LAB — TSH: TSH: 2.02 u[IU]/mL (ref 0.450–4.500)

## 2019-07-18 LAB — HEMOGLOBIN A1C
Est. average glucose Bld gHb Est-mCnc: 186 mg/dL
Hgb A1c MFr Bld: 8.1 % — ABNORMAL HIGH (ref 4.8–5.6)

## 2019-07-18 LAB — T3: T3, Total: 131 ng/dL (ref 71–180)

## 2019-07-18 LAB — INSULIN, RANDOM: INSULIN: 36.7 u[IU]/mL — ABNORMAL HIGH (ref 2.6–24.9)

## 2019-07-18 LAB — VITAMIN D 25 HYDROXY (VIT D DEFICIENCY, FRACTURES): Vit D, 25-Hydroxy: 37.5 ng/mL (ref 30.0–100.0)

## 2019-07-18 LAB — T4, FREE: Free T4: 1.19 ng/dL (ref 0.82–1.77)

## 2019-07-30 ENCOUNTER — Encounter (INDEPENDENT_AMBULATORY_CARE_PROVIDER_SITE_OTHER): Payer: Self-pay | Admitting: Bariatrics

## 2019-07-30 DIAGNOSIS — E782 Mixed hyperlipidemia: Secondary | ICD-10-CM | POA: Insufficient documentation

## 2019-07-30 DIAGNOSIS — E1169 Type 2 diabetes mellitus with other specified complication: Secondary | ICD-10-CM | POA: Insufficient documentation

## 2019-07-31 ENCOUNTER — Other Ambulatory Visit: Payer: Self-pay

## 2019-07-31 ENCOUNTER — Ambulatory Visit (INDEPENDENT_AMBULATORY_CARE_PROVIDER_SITE_OTHER): Payer: Medicare HMO | Admitting: Bariatrics

## 2019-07-31 ENCOUNTER — Encounter (INDEPENDENT_AMBULATORY_CARE_PROVIDER_SITE_OTHER): Payer: Self-pay | Admitting: Bariatrics

## 2019-07-31 VITALS — BP 117/81 | HR 78 | Temp 98.3°F | Ht 66.0 in | Wt 289.0 lb

## 2019-07-31 DIAGNOSIS — E1169 Type 2 diabetes mellitus with other specified complication: Secondary | ICD-10-CM

## 2019-07-31 DIAGNOSIS — E559 Vitamin D deficiency, unspecified: Secondary | ICD-10-CM

## 2019-07-31 DIAGNOSIS — Z6841 Body Mass Index (BMI) 40.0 and over, adult: Secondary | ICD-10-CM

## 2019-07-31 DIAGNOSIS — E785 Hyperlipidemia, unspecified: Secondary | ICD-10-CM

## 2019-07-31 DIAGNOSIS — E1165 Type 2 diabetes mellitus with hyperglycemia: Secondary | ICD-10-CM | POA: Diagnosis not present

## 2019-07-31 MED ORDER — VITAMIN D (ERGOCALCIFEROL) 1.25 MG (50000 UNIT) PO CAPS: 50000.0000 [IU] | ORAL_CAPSULE | ORAL | 0 refills | Status: DC

## 2019-08-01 ENCOUNTER — Encounter (INDEPENDENT_AMBULATORY_CARE_PROVIDER_SITE_OTHER): Payer: Self-pay | Admitting: Bariatrics

## 2019-08-01 NOTE — Progress Notes (Signed)
Chief Complaint:   OBESITY Judy [name] is here to discuss her progress with her obesity treatment plan along with follow-up of her obesity related diagnoses. Judy [name] is on the Category 4 Plan and states she is following her eating plan approximately 60% of the time. Judy [name] states she is exercising 0 minutes 0 times per week.  Today's visit was #: 2 Starting weight: 291 lbs Starting date: 07/17/2019 Today's weight: 289 lbs Today's date: 07/31/2019 Total lbs lost to date: 2 Total lbs lost since last in-office visit: 2  Interim History: Judy [name] is down 2 lbs, but states that she went down 9 lbs but gained back 7 lbs as her sister is in town.  Subjective:   Type 2 diabetes mellitus with hyperglycemia, without long-term current use of insulin (HCC). Judy [name] is taking [place].  Lab Results  Component Value Date   HGBA1C 8.1 (H) 07/17/2019   HGBA1C 7.1 (H) 07/12/2016   Lab Results  Component Value Date   LDLCALC 89 07/17/2019   CREATININE 0.68 07/17/2019   Lab Results  Component Value Date   INSULIN 36.7 (H) 07/17/2019   Vitamin D deficiency. Judy [name] is taking OTC Vitamin D. Last Vitamin D was 37.5 on 07/17/2019.  Hyperlipidemia associated with type 2 diabetes mellitus (HCC). Judy [name] is taking Lipitor. Lipid panel okay.   Lab Results  Component Value Date   CHOL 157 07/17/2019   HDL 43 07/17/2019   LDLCALC 89 07/17/2019   TRIG 143 07/17/2019   CHOLHDL 3.6 07/12/2016   Lab Results  Component Value Date   ALT 46 (H) 07/17/2019   AST 27 07/17/2019   ALKPHOS 221 (H) 07/17/2019   BILITOT 0.8 07/17/2019   The 10-year ASCVD risk score Denman George DC Jr., et al., 2013) is: 6.2%   Values used to calculate the score:     Age: 59 years     Sex: Female     Is Non-Hispanic African American: No     Diabetic: Yes     Tobacco smoker: No     Systolic Blood Pressure: 117 mmHg     Is BP treated: Yes     HDL Cholesterol: 43 mg/dL     Total Cholesterol: 157 mg/dL  Assessment/Plan:    Type 2 diabetes mellitus with hyperglycemia, without long-term current use of insulin (HCC). Good blood sugar control is important to decrease the likelihood of diabetic complications such as nephropathy, neuropathy, limb loss, blindness, coronary artery disease, and death. Intensive lifestyle modification including diet, exercise and weight loss are the first line of treatment for diabetes. Judy [name] will continue her medication as directed. She will write down her fasting blood sugars and 2-hour postprandials.  Vitamin D deficiency. Low Vitamin D level contributes to fatigue and are associated with obesity, breast, and colon cancer. She was given a prescription for Vitamin D, Ergocalciferol, (DRISDOL) 1.25 MG (50000 UNIT) CAPS capsule every week #4 with 0 refills and will follow-up for routine testing of Vitamin D, at least 2-3 times per year to avoid over-replacement.   Hyperlipidemia associated with type 2 diabetes mellitus (HCC). Cardiovascular risk and specific lipid/LDL goals reviewed.  We discussed several lifestyle modifications today and Judy [name] will continue to work on diet, exercise and weight loss efforts. Orders and follow up as documented in patient record. She will continue Lipitor as directed.  Counseling Intensive lifestyle modifications are the first line treatment for this issue. . Dietary changes: Increase soluble fiber. Decrease simple carbohydrates. . Exercise changes: Moderate to vigorous-intensity  aerobic activity 150 minutes per week if tolerated. . Lipid-lowering medications: see documented in medical record.  Class 3 severe obesity with serious comorbidity and body mass index (BMI) of 45.0 to 49.9 in adult, unspecified obesity type (Judy [name]).  Judy [name] is currently in the action stage of change. As such, her goal is to continue with weight loss efforts. She has agreed to the Category 4 Plan.   She will work on meal planning, intentional eating, and will start to journal.  We  independently reviewed with the patient labs from 07/17/2019 including CMP, lipids, Vitamin D, A1c, insulin, and thyroid panel.  Exercise goals: All adults should avoid inactivity. Some physical activity is better than none, and adults who participate in any amount of physical activity gain some health benefits.  Behavioral modification strategies: increasing lean protein intake, decreasing simple carbohydrates, increasing vegetables, increasing water intake, dealing with family or coworker sabotage, travel eating strategies, avoiding temptations and planning for success.  Judy [name] has agreed to follow-up with our clinic in 2 weeks. She was informed of the importance of frequent follow-up visits to maximize her success with intensive lifestyle modifications for her multiple health conditions.   Objective:   Blood pressure 117/81, pulse 78, temperature 98.3 F (36.8 C), height 5\' 6"  (1.676 m), weight 289 lb (131.1 kg), SpO2 96 %. Body mass index is 46.65 kg/m.  General: Cooperative, alert, well developed, in no acute distress. HEENT: Conjunctivae and lids unremarkable. Cardiovascular: Regular rhythm.  Lungs: Normal work of breathing. Neurologic: No focal deficits.   Lab Results  Component Value Date   CREATININE 0.68 07/17/2019   BUN 10 07/17/2019   NA 142 07/17/2019   K 4.2 07/17/2019   CL 105 07/17/2019   CO2 22 07/17/2019   Lab Results  Component Value Date   ALT 46 (H) 07/17/2019   AST 27 07/17/2019   ALKPHOS 221 (H) 07/17/2019   BILITOT 0.8 07/17/2019   Lab Results  Component Value Date   HGBA1C 8.1 (H) 07/17/2019   HGBA1C 7.1 (H) 07/12/2016   Lab Results  Component Value Date   INSULIN 36.7 (H) 07/17/2019   Lab Results  Component Value Date   TSH 2.020 07/17/2019   Lab Results  Component Value Date   CHOL 157 07/17/2019   HDL 43 07/17/2019   LDLCALC 89 07/17/2019   TRIG 143 07/17/2019   CHOLHDL 3.6 07/12/2016   Lab Results  Component Value Date   WBC 12.3  (H) 09/24/2018   HGB 15.1 (H) 09/24/2018   HCT 47.2 (H) 09/24/2018   MCV 94.0 09/24/2018   PLT 244 09/24/2018   Lab Results  Component Value Date   FERRITIN 41 12/31/2014   Obesity Behavioral Intervention Documentation for Insurance:   Approximately 15 minutes were spent on the discussion below.  ASK: We discussed the diagnosis of obesity with Judy [name] today and Judy [name] agreed to give [place] permission to discuss obesity behavioral modification therapy today.  ASSESS: Judy [name] has the diagnosis of obesity and her BMI today is 46.8. Judy [name] is in the action stage of change.   ADVISE: Judy [name] was educated on the multiple health risks of obesity as well as the benefit of weight loss to improve her health. She was advised of the need for long term treatment and the importance of lifestyle modifications to improve her current health and to decrease her risk of future health problems.  AGREE: Multiple dietary modification options and treatment options were discussed and Judy [name] agreed to follow the recommendations documented in  the above note.  ARRANGE: Judy [name] was educated on the importance of frequent visits to treat obesity as outlined per CMS and USPSTF guidelines and agreed to schedule her next follow up appointment today.  Attestation Statements:   Reviewed by clinician on day of visit: allergies, medications, problem list, medical history, surgical history, family history, social history, and previous encounter notes.  Judy [name], am acting as Energy manager for [employer], DO   I have reviewed the above documentation for accuracy and completeness, and I agree with the above. Judy Capra, DO

## 2019-08-10 DIAGNOSIS — J449 Chronic obstructive pulmonary disease, unspecified: Secondary | ICD-10-CM | POA: Diagnosis not present

## 2019-08-10 DIAGNOSIS — J4520 Mild intermittent asthma, uncomplicated: Secondary | ICD-10-CM | POA: Diagnosis not present

## 2019-08-15 ENCOUNTER — Ambulatory Visit (INDEPENDENT_AMBULATORY_CARE_PROVIDER_SITE_OTHER): Payer: Medicare HMO | Admitting: Bariatrics

## 2019-08-20 ENCOUNTER — Other Ambulatory Visit: Payer: Self-pay | Admitting: Family Medicine

## 2019-08-21 ENCOUNTER — Other Ambulatory Visit (INDEPENDENT_AMBULATORY_CARE_PROVIDER_SITE_OTHER): Payer: Self-pay | Admitting: Bariatrics

## 2019-08-21 DIAGNOSIS — E559 Vitamin D deficiency, unspecified: Secondary | ICD-10-CM

## 2019-08-27 ENCOUNTER — Ambulatory Visit (INDEPENDENT_AMBULATORY_CARE_PROVIDER_SITE_OTHER): Payer: Medicare HMO | Admitting: Bariatrics

## 2019-09-03 ENCOUNTER — Ambulatory Visit (INDEPENDENT_AMBULATORY_CARE_PROVIDER_SITE_OTHER): Payer: Medicare HMO | Admitting: Bariatrics

## 2019-09-03 ENCOUNTER — Encounter: Payer: Self-pay | Admitting: Physician Assistant

## 2019-09-03 ENCOUNTER — Other Ambulatory Visit: Payer: Self-pay

## 2019-09-03 ENCOUNTER — Ambulatory Visit (INDEPENDENT_AMBULATORY_CARE_PROVIDER_SITE_OTHER): Payer: Medicare HMO | Admitting: Physician Assistant

## 2019-09-03 VITALS — BP 106/72 | HR 78 | Temp 97.7°F | Ht 65.0 in | Wt 290.0 lb

## 2019-09-03 DIAGNOSIS — E785 Hyperlipidemia, unspecified: Secondary | ICD-10-CM

## 2019-09-03 DIAGNOSIS — F411 Generalized anxiety disorder: Secondary | ICD-10-CM | POA: Insufficient documentation

## 2019-09-03 DIAGNOSIS — F32A Depression, unspecified: Secondary | ICD-10-CM | POA: Insufficient documentation

## 2019-09-03 DIAGNOSIS — Z780 Asymptomatic menopausal state: Secondary | ICD-10-CM | POA: Insufficient documentation

## 2019-09-03 DIAGNOSIS — E038 Other specified hypothyroidism: Secondary | ICD-10-CM

## 2019-09-03 DIAGNOSIS — J302 Other seasonal allergic rhinitis: Secondary | ICD-10-CM | POA: Insufficient documentation

## 2019-09-03 DIAGNOSIS — I110 Hypertensive heart disease with heart failure: Secondary | ICD-10-CM | POA: Insufficient documentation

## 2019-09-03 DIAGNOSIS — M1990 Unspecified osteoarthritis, unspecified site: Secondary | ICD-10-CM | POA: Insufficient documentation

## 2019-09-03 DIAGNOSIS — E1169 Type 2 diabetes mellitus with other specified complication: Secondary | ICD-10-CM

## 2019-09-03 DIAGNOSIS — I509 Heart failure, unspecified: Secondary | ICD-10-CM

## 2019-09-03 DIAGNOSIS — Z1231 Encounter for screening mammogram for malignant neoplasm of breast: Secondary | ICD-10-CM | POA: Diagnosis not present

## 2019-09-03 DIAGNOSIS — G629 Polyneuropathy, unspecified: Secondary | ICD-10-CM | POA: Insufficient documentation

## 2019-09-03 DIAGNOSIS — Z8669 Personal history of other diseases of the nervous system and sense organs: Secondary | ICD-10-CM | POA: Insufficient documentation

## 2019-09-03 DIAGNOSIS — I10 Essential (primary) hypertension: Secondary | ICD-10-CM | POA: Diagnosis not present

## 2019-09-03 DIAGNOSIS — Z720 Tobacco use: Secondary | ICD-10-CM | POA: Insufficient documentation

## 2019-09-03 DIAGNOSIS — E669 Obesity, unspecified: Secondary | ICD-10-CM

## 2019-09-03 DIAGNOSIS — Z8739 Personal history of other diseases of the musculoskeletal system and connective tissue: Secondary | ICD-10-CM | POA: Insufficient documentation

## 2019-09-03 DIAGNOSIS — E559 Vitamin D deficiency, unspecified: Secondary | ICD-10-CM | POA: Insufficient documentation

## 2019-09-03 DIAGNOSIS — F419 Anxiety disorder, unspecified: Secondary | ICD-10-CM | POA: Insufficient documentation

## 2019-09-03 DIAGNOSIS — H16141 Punctate keratitis, right eye: Secondary | ICD-10-CM | POA: Diagnosis not present

## 2019-09-03 DIAGNOSIS — F330 Major depressive disorder, recurrent, mild: Secondary | ICD-10-CM | POA: Insufficient documentation

## 2019-09-03 LAB — POCT URINALYSIS DIPSTICK
Bilirubin, UA: NEGATIVE
Blood, UA: NEGATIVE
Glucose, UA: POSITIVE — AB
Ketones, UA: NEGATIVE
Leukocytes, UA: NEGATIVE
Nitrite, UA: NEGATIVE
Protein, UA: NEGATIVE
Spec Grav, UA: 1.02 (ref 1.010–1.025)
Urobilinogen, UA: NEGATIVE E.U./dL — AB
pH, UA: 6 (ref 5.0–8.0)

## 2019-09-03 LAB — POCT UA - MICROALBUMIN
Creatinine, POC: NEGATIVE mg/dL
Microalbumin Ur, POC: NEGATIVE mg/L

## 2019-09-03 MED ORDER — LEVOTHYROXINE SODIUM 75 MCG PO TABS: 75.0000 ug | ORAL_TABLET | Freq: Every day | ORAL | 1 refills | Status: DC

## 2019-09-03 MED ORDER — VITAMIN D (ERGOCALCIFEROL) 1.25 MG (50000 UNIT) PO CAPS: 50000.0000 [IU] | ORAL_CAPSULE | ORAL | 1 refills | Status: DC

## 2019-09-03 NOTE — Assessment & Plan Note (Signed)
Continue current meds as directed 

## 2019-09-03 NOTE — Assessment & Plan Note (Signed)
Continue meds as directed

## 2019-09-03 NOTE — Progress Notes (Signed)
Established Patient Office Visit  Subjective:  Patient ID: Judy [name], female    DOB: 1960/03/30  Age: 59 y.o. MRN: [id number]  CC:  Chief Complaint  Patient presents with  . Hypertension    Medication Refill    HPI Judy [name] presents for follow up hypertension  Pt presents for follow up of hypertension.  The patient is tolerating the medication well without side effects. Compliance with treatment has been good; including taking medication as directed , maintains a healthy diet and regular exercise regimen , and following up as directed. Her current medications include coreg 12.5mg  bid,  Pt also has a history of CHF - sees cardiology every 6 months - currently on Entresto  Mixed hyperlipidemia  Pt presents with hyperlipidemia.Compliance with treatment has been fair The patient is compliant with medications, maintains a low cholesterol diet , follows up as directed . The patient denies experiencing any hypercholesterolemia related symptoms. She is on lipitor 20mg  qd  Pt with history of hypothyroidism - last TSH normal one month ago - currently taking levothyroxine qd  Pt with history of low vitamin D - was recently placed on supplement after labwork one month ago - level was 37  Pt with history of NIDDM - she is currently on farxiga 10mg  qd, - she states she has been on this medication several months but really has not been watching her diet [facility] checked a Hgb a1c 1 month ago and it was 8.1 - recommend pt tighten up on diet and recheck in 2 months - if still elevated will need to adjust medication  Pt with history of chronic back pain - has Harrington rod - uses tramadol, gabapentin and tizanadine on an as needed basis  Past Medical History:  Diagnosis Date  . Anxiety   . Back pain   . Bilateral swelling of feet   . Chronic combined systolic and diastolic CHF (congestive heart failure) (HCC) 10/07/2014  . COPD (chronic obstructive  pulmonary disease) (HCC)   . DCM (dilated cardiomyopathy) (HCC) 04/25/2014   EF 28% by MRI despite maximum medical therapy  . Depression   . Diabetes mellitus without complication (HCC)   . Epilepsy (HCC)    as a child  . Fatty liver   . Former tobacco use   . Hyperlipidemia   . Hypertension   . Hypothyroidism   . Joint pain   . Leg swelling   . Morbid obesity (HCC)   . NICM (nonischemic cardiomyopathy) (HCC)   . OSA (obstructive sleep apnea)    moderate with AHI 26/hr with oxygen desaturations as low as 70%  . Scoliosis   . Sleep apnea   . SOB (shortness of breath)     Past Surgical History:  Procedure Laterality Date  . ABDOMINAL HYSTERECTOMY    . ankle artery involved cyst removal    . CARDIAC CATHETERIZATION  05/14/2014   normal coronary arteries  . CARPAL TUNNEL RELEASE    . FASCIOTOMY     1993 for platar fasciitis  . harrington rod scoliosis     1981  . LEFT HEART CATHETERIZATION WITH CORONARY ANGIOGRAM N/A 05/14/2014   Procedure: LEFT HEART CATHETERIZATION WITH CORONARY ANGIOGRAM;  Surgeon: 05/16/2014, MD;  Location: [facility];  Service: Cardiovascular;  Laterality: N/A;  . ROTATOR CUFF REPAIR    . UMBILICAL HERNIA REPAIR    . VESICOVAGINAL FISTULA CLOSURE W/ TAH      Family History  Problem  Relation Age of Onset  . Emphysema Father   . Allergies Father   . Heart failure Father   . Heart disease Father 29       MI  . Diabetes Father   . Hyperlipidemia Father   . Hypertension Father   . Thyroid disease Father   . Alcohol abuse Father   . Obesity Father   . Diabetes Mother   . Hyperlipidemia Mother   . Obesity Mother     Social History   Socioeconomic History  . Marital status: Married    Spouse name: Not on file  . Number of children: Not on file  . Years of education: Not on file  . Highest education level: Not on file  Occupational History  . Occupation: disabled  Tobacco Use  . Smoking status: Former Smoker    Packs/day: 1.00     Years: 15.00    Pack years: 15.00    Types: Cigarettes    Quit date: 02/14/2005    Years since quitting: 14.5  . Smokeless tobacco: Never Used  Vaping Use  . Vaping Use: Never used  Substance and Sexual Activity  . Alcohol use: Yes    Comment: occ  . Drug use: No  . Sexual activity: Not on file  Other Topics Concern  . Not on file  Social History Narrative   Lives in [place] with spouse and son.   Previously worked as a Comptroller         Social Determinants of Corporate investment banker Strain:   . Difficulty of Paying Living Expenses:   Food Insecurity:   . Worried About Programme researcher, broadcasting/film/video in the Last Year:   . Barista in the Last Year:   Transportation Needs:   . Freight forwarder (Medical):   Marland Kitchen Lack of Transportation (Non-Medical):   Physical Activity:   . Days of Exercise per Week:   . Minutes of Exercise per Session:   Stress:   . Feeling of Stress :   Social Connections:   . Frequency of Communication with Friends and Family:   . Frequency of Social Gatherings with Friends and Family:   . Attends Religious Services:   . Active Member of Clubs or Organizations:   . Attends Banker Meetings:   Marland Kitchen Marital Status:   Intimate Partner Violence:   . Fear of Current or Ex-Partner:   . Emotionally Abused:   Marland Kitchen Physically Abused:   . Sexually Abused:      Current Outpatient Medications:  .  aspirin 81 MG tablet, Take 81 mg by mouth at bedtime. , Disp: , Rfl:  .  atorvastatin (LIPITOR) 20 MG tablet, TAKE 1 TABLET AT BEDTIME, Disp: 90 tablet, Rfl: 3 .  carvedilol (COREG) 12.5 MG tablet, TAKE 1 TABLET TWICE DAILY WITH A MEAL, Disp: 180 tablet, Rfl: 3 .  Cholecalciferol (VITAMIN D-3) 5000 units TABS, Take 1 capsule by mouth daily., Disp: , Rfl:  .  Coenzyme Q10 (CO Q 10) 100 MG CAPS, Take 200 mg by mouth daily., Disp: , Rfl:  .  FARXIGA 10 MG TABS tablet, Take 10 mg by mouth daily., Disp: , Rfl:  .  gabapentin (NEURONTIN)  400 MG capsule, Take 2 tablets by mouth in the AM and 2 by mouth in the PM, Disp: , Rfl: 1 .  levothyroxine (SYNTHROID) 75 MCG tablet, Take 1 tablet (75 mcg total) by mouth daily., Disp: 90 tablet, Rfl: 1 .  Lutein 20 MG TABS, Take 1 tablet by mouth daily., Disp: , Rfl:  .  Multiple Vitamin (MULTIVITAMIN WITH MINERALS) TABS tablet, Take 1 tablet by mouth daily., Disp: , Rfl:  .  OXYGEN, 2lpm 2 lpm as needed, Disp: , Rfl:  .  sacubitril-valsartan (ENTRESTO) 97-103 MG, Take 1 tablet by mouth 2 (two) times daily., Disp: 180 tablet, Rfl: 2 .  STIOLTO RESPIMAT 2.5-2.5 MCG/ACT AERS, INHALE 2 PUFFS INTO THE LUNGS DAILY., Disp: 12 g, Rfl: 5 .  tiZANidine (ZANAFLEX) 4 MG tablet, Take 4 mg by mouth daily., Disp: , Rfl:  .  traMADol (ULTRAM) 50 MG tablet, Take 1 tablet by mouth every 6 (six) hours as needed., Disp: , Rfl: 0 .  UNABLE TO FIND, CPAP with o2 2lpm  DME- [facility], Disp: , Rfl:  .  Vitamin D, Ergocalciferol, (DRISDOL) 1.25 MG (50000 UNIT) CAPS capsule, Take 1 capsule (50,000 Units total) by mouth every 7 (seven) days., Disp: 12 capsule, Rfl: 1   Allergies  Allergen Reactions  . Demerol [Meperidine] Nausea And Vomiting    ROS CONSTITUTIONAL: Negative for chills, fatigue, fever, unintentional weight gain and unintentional weight loss.  E/N/T: Negative for ear pain, nasal congestion and sore throat.  CARDIOVASCULAR: Negative for chest pain, dizziness, palpitations and pedal edema.  RESPIRATORY: Negative for recent cough and dyspnea.  GASTROINTESTINAL: Negative for abdominal pain, acid reflux symptoms, constipation, diarrhea, nausea and vomiting.  MSK: Negative for arthralgias and myalgias.  INTEGUMENTARY: Negative for rash.  NEUROLOGICAL: Negative for dizziness and headaches.  PSYCHIATRIC: Negative for sleep disturbance and to question depression screen.  Negative for depression, negative for anhedonia.        Objective:    PHYSICAL EXAM:   VS: BP 106/72 (BP Location: Left Arm, Patient  Position: Sitting)   Pulse 78   Temp 97.7 F (36.5 C) (Temporal)   Ht 5\' 5"  (1.651 m)   Wt 290 lb (131.5 kg)   SpO2 98%   BMI 48.26 kg/m   GEN: Well nourished, well developed, in no acute distress  Cardiac: RRR; no murmurs, rubs, or gallops,no edema - no significant varicosities Respiratory:  normal respiratory rate and pattern with no distress - normal breath sounds with no rales, rhonchi, wheezes or rubs MS: no deformity or atrophy  Skin: warm and dry, no rash  Neuro:  Alert and Oriented x 3, Strength and sensation are intact - CN II-Xii grossly intact Psych: euthymic mood, appropriate affect and demeanor  BP 106/72 (BP Location: Left Arm, Patient Position: Sitting)   Pulse 78   Temp 97.7 F (36.5 C) (Temporal)   Ht 5\' 5"  (1.651 m)   Wt 290 lb (131.5 kg)   SpO2 98%   BMI 48.26 kg/m  Wt Readings from Last 3 Encounters:  09/03/19 290 lb (131.5 kg)  07/31/19 289 lb (131.1 kg)  07/17/19 291 lb (132 kg)    Office Visit on 09/03/2019  Component Date Value Ref Range Status  . Glucose, UA 09/03/2019 Positive* Negative Final  . Bilirubin, UA 09/03/2019 Negative   Final  . Ketones, UA 09/03/2019 Negative   Final  . Spec Grav, UA 09/03/2019 1.020  1.010 - 1.025 Final  . Blood, UA 09/03/2019 Negative   Final  . pH, UA 09/03/2019 6.0  5.0 - 8.0 Final  . Protein, UA 09/03/2019 Negative  Negative Final  . Urobilinogen, UA 09/03/2019 negative* 0.2 or 1.0 E.U./dL Final  . Nitrite, UA 09/05/2019 Negative   Final  . Leukocytes, UA 09/03/2019 Negative  Negative Final  . Microalbumin Ur, POC 09/03/2019 Negative  mg/L Final  . Creatinine, POC 09/03/2019 Negative  mg/dL Final   Diabetic Foot Exam - Simple   Simple Foot Form Diabetic Foot exam was performed with the following findings: Yes 09/03/2019  2:42 PM  Visual Inspection No deformities, no ulcerations, no other skin breakdown bilaterally: Yes Sensation Testing Intact to touch and monofilament testing bilaterally: Yes Pulse  Check Posterior Tibialis and Dorsalis pulse intact bilaterally: Yes Comments     Health Maintenance Due  Topic Date Due  . MAMMOGRAM  02/10/2019    There are no preventive care reminders to display for this patient.  Lab Results  Component Value Date   TSH 2.020 07/17/2019   Lab Results  Component Value Date   WBC 12.3 (H) 09/24/2018   HGB 15.1 (H) 09/24/2018   HCT 47.2 (H) 09/24/2018   MCV 94.0 09/24/2018   PLT 244 09/24/2018   Lab Results  Component Value Date   NA 142 07/17/2019   K 4.2 07/17/2019   CO2 22 07/17/2019   GLUCOSE 164 (H) 07/17/2019   BUN 10 07/17/2019   CREATININE 0.68 07/17/2019   BILITOT 0.8 07/17/2019   ALKPHOS 221 (H) 07/17/2019   AST 27 07/17/2019   ALT 46 (H) 07/17/2019   PROT 6.8 07/17/2019   ALBUMIN 4.3 07/17/2019   CALCIUM 9.3 07/17/2019   ANIONGAP 13 09/24/2018   GFR 100.87 10/01/2014   Lab Results  Component Value Date   CHOL 157 07/17/2019   Lab Results  Component Value Date   HDL 43 07/17/2019   Lab Results  Component Value Date   LDLCALC 89 07/17/2019   Lab Results  Component Value Date   TRIG 143 07/17/2019   Lab Results  Component Value Date   CHOLHDL 3.6 07/12/2016   Lab Results  Component Value Date   HGBA1C 8.1 (H) 07/17/2019      Assessment & Plan:   Problem List Items Addressed This Visit      Cardiovascular and Mediastinum   Essential hypertension - Primary    Well controlled.  No changes to medicines.  Continue to work on eating a healthy diet and exercise.  Labs drawn today.        Relevant Orders   CBC with Differential/Platelet   Comprehensive metabolic panel   Congestive heart failure (HCC)    Continue current meds Follow up with cardiology as directed        Endocrine   Hyperlipidemia associated with type 2 diabetes mellitus (HCC)    Well controlled.  No changes to medicines.  Continue to work on eating a healthy diet and exercise.  Labs drawn today.        Other specified  hypothyroidism    Continue current meds as directed      Relevant Medications   levothyroxine (SYNTHROID) 75 MCG tablet     Other   Vitamin D deficiency    Continue meds as directed      Relevant Medications   Vitamin D, Ergocalciferol, (DRISDOL) 1.25 MG (50000 UNIT) CAPS capsule      Meds ordered this encounter  Medications  . Vitamin D, Ergocalciferol, (DRISDOL) 1.25 MG (50000 UNIT) CAPS capsule    Sig: Take 1 capsule (50,000 Units total) by mouth every 7 (seven) days.    Dispense:  12 capsule    Refill:  1    Order Specific Question:   Supervising Provider    AnswerBlane Ohara [id number]  .  levothyroxine (SYNTHROID) 75 MCG tablet    Sig: Take 1 tablet (75 mcg total) by mouth daily.    Dispense:  90 tablet    Refill:  1    Order Specific Question:   Supervising Provider    AnswerCorey Harold    Follow-up: Return in about 3 months (around 12/04/2019) for chronic fasting Dr Sedalia Muta.    SARA R Keylie Beavers, PA-C

## 2019-09-03 NOTE — Assessment & Plan Note (Signed)
Continue current meds Follow up with cardiology as directed 

## 2019-09-03 NOTE — Assessment & Plan Note (Signed)
Well controlled.  ?No changes to medicines.  ?Continue to work on eating a healthy diet and exercise.  ?Labs drawn today.  ?

## 2019-09-04 ENCOUNTER — Other Ambulatory Visit: Payer: Medicare HMO

## 2019-09-04 DIAGNOSIS — I10 Essential (primary) hypertension: Secondary | ICD-10-CM | POA: Diagnosis not present

## 2019-09-04 LAB — COMPREHENSIVE METABOLIC PANEL
ALT: 24 IU/L (ref 0–32)
AST: 15 IU/L (ref 0–40)
Albumin/Globulin Ratio: 1.7 (ref 1.2–2.2)
Albumin: 3.9 g/dL (ref 3.8–4.9)
Alkaline Phosphatase: 196 IU/L — ABNORMAL HIGH (ref 48–121)
BUN/Creatinine Ratio: 22 (ref 9–23)
BUN: 14 mg/dL (ref 6–24)
Bilirubin Total: 0.6 mg/dL (ref 0.0–1.2)
CO2: 23 mmol/L (ref 20–29)
Calcium: 9.2 mg/dL (ref 8.7–10.2)
Chloride: 104 mmol/L (ref 96–106)
Creatinine, Ser: 0.63 mg/dL (ref 0.57–1.00)
GFR calc Af Amer: 114 mL/min/{1.73_m2} (ref >59)
GFR calc non Af Amer: 98 mL/min/{1.73_m2} (ref >59)
Globulin, Total: 2.3 g/dL (ref 1.5–4.5)
Glucose: 172 mg/dL — ABNORMAL HIGH (ref 65–99)
Potassium: 4.7 mmol/L (ref 3.5–5.2)
Sodium: 140 mmol/L (ref 134–144)
Total Protein: 6.2 g/dL (ref 6.0–8.5)

## 2019-09-04 LAB — CBC WITH DIFFERENTIAL/PLATELET
Basophils Absolute: 0.1 10*3/uL (ref 0.0–0.2)
Basos: 1 %
EOS (ABSOLUTE): 0.2 10*3/uL (ref 0.0–0.4)
Eos: 3 %
Hematocrit: 45.1 % (ref 34.0–46.6)
Hemoglobin: 14.5 g/dL (ref 11.1–15.9)
Immature Grans (Abs): 0 10*3/uL (ref 0.0–0.1)
Immature Granulocytes: 0 %
Lymphocytes Absolute: 1.6 10*3/uL (ref 0.7–3.1)
Lymphs: 22 %
MCH: 29.7 pg (ref 26.6–33.0)
MCHC: 32.2 g/dL (ref 31.5–35.7)
MCV: 92 fL (ref 79–97)
Monocytes Absolute: 0.6 10*3/uL (ref 0.1–0.9)
Monocytes: 9 %
Neutrophils Absolute: 4.6 10*3/uL (ref 1.4–7.0)
Neutrophils: 65 %
Platelets: 234 10*3/uL (ref 150–450)
RBC: 4.88 x10E6/uL (ref 3.77–5.28)
RDW: 12.6 % (ref 11.7–15.4)
WBC: 7.1 10*3/uL (ref 3.4–10.8)

## 2019-09-09 DIAGNOSIS — J4520 Mild intermittent asthma, uncomplicated: Secondary | ICD-10-CM | POA: Diagnosis not present

## 2019-09-09 DIAGNOSIS — J449 Chronic obstructive pulmonary disease, unspecified: Secondary | ICD-10-CM | POA: Diagnosis not present

## 2019-09-17 DIAGNOSIS — Z1231 Encounter for screening mammogram for malignant neoplasm of breast: Secondary | ICD-10-CM | POA: Diagnosis not present

## 2019-10-01 DIAGNOSIS — G4733 Obstructive sleep apnea (adult) (pediatric): Secondary | ICD-10-CM | POA: Diagnosis not present

## 2019-10-07 ENCOUNTER — Ambulatory Visit (INDEPENDENT_AMBULATORY_CARE_PROVIDER_SITE_OTHER): Payer: Medicare HMO | Admitting: Bariatrics

## 2019-10-10 DIAGNOSIS — J449 Chronic obstructive pulmonary disease, unspecified: Secondary | ICD-10-CM | POA: Diagnosis not present

## 2019-10-10 DIAGNOSIS — J4520 Mild intermittent asthma, uncomplicated: Secondary | ICD-10-CM | POA: Diagnosis not present

## 2019-10-17 ENCOUNTER — Ambulatory Visit (INDEPENDENT_AMBULATORY_CARE_PROVIDER_SITE_OTHER): Payer: Medicare HMO | Admitting: Family Medicine

## 2019-10-17 ENCOUNTER — Other Ambulatory Visit: Payer: Self-pay

## 2019-10-17 ENCOUNTER — Encounter (INDEPENDENT_AMBULATORY_CARE_PROVIDER_SITE_OTHER): Payer: Self-pay | Admitting: Family Medicine

## 2019-10-17 VITALS — BP 104/60 | HR 70 | Temp 98.0°F | Ht 66.0 in | Wt 284.0 lb

## 2019-10-17 DIAGNOSIS — E559 Vitamin D deficiency, unspecified: Secondary | ICD-10-CM

## 2019-10-17 DIAGNOSIS — Z6841 Body Mass Index (BMI) 40.0 and over, adult: Secondary | ICD-10-CM | POA: Diagnosis not present

## 2019-10-17 NOTE — Progress Notes (Signed)
Chief Complaint:   OBESITY Halen is here to discuss her progress with her obesity treatment plan along with follow-up of her obesity related diagnoses. Shenice is on the Category 4 Plan and states she is following her eating plan approximately 25% of the time. Maralee states she is doing yoga for 20 minutes 3 times per week.  Today's visit was #: 3 Starting weight: 291 lbs Starting date: 07/17/2019 Today's weight: 284 lbs Today's date: 10/17/2019 Total lbs lost to date: 7 Total lbs lost since last in-office visit: 4  Interim History: Tayona has been able to follow her plan closely for the last 2 months with her husbands recent health issues. She has lost some weight during this time by trying to portion control and make smarter food choices. She is ready to get back on a structured plan.  Subjective:   1. Vitamin D deficiency Amaani's Vit D level was low, and she is on prescription Vit D. She is due for labs soon.  Assessment/Plan:   1. Vitamin D deficiency Low Vitamin D level contributes to fatigue and are associated with obesity, breast, and colon cancer. Devora agreed to continue taking prescription Vitamin D 50,000 IU every week and will follow-up for routine testing of Vitamin D, at least 2-3 times per year to avoid over-replacement. We will plan to recheck labs in 2-3 weeks.  2. Class 3 severe obesity with serious comorbidity and body mass index (BMI) of 45.0 to 49.9 in adult, unspecified obesity type (HCC) Cynai is currently in the action stage of change. As such, her goal is to continue with weight loss efforts. She has agreed to restart the Category 4 Plan.   Exercise goals: As is.  Behavioral modification strategies: increasing lean protein intake.  Madisan has agreed to follow-up with our clinic in 2 to 3 weeks. She was informed of the importance of frequent follow-up visits to maximize her success with intensive lifestyle modifications for her multiple health conditions.    Objective:   Blood pressure 104/60, pulse 70, temperature 98 F (36.7 C), height 5\' 6"  (1.676 m), weight 284 lb (128.8 kg), SpO2 96 %. Body mass index is 45.84 kg/m.  General: Cooperative, alert, well developed, in no acute distress. HEENT: Conjunctivae and lids unremarkable. Cardiovascular: Regular rhythm.  Lungs: Normal work of breathing. Neurologic: No focal deficits.   Lab Results  Component Value Date   CREATININE 0.63 09/04/2019   BUN 14 09/04/2019   NA 140 09/04/2019   K 4.7 09/04/2019   CL 104 09/04/2019   CO2 23 09/04/2019   Lab Results  Component Value Date   ALT 24 09/04/2019   AST 15 09/04/2019   ALKPHOS 196 (H) 09/04/2019   BILITOT 0.6 09/04/2019   Lab Results  Component Value Date   HGBA1C 8.1 (H) 07/17/2019   HGBA1C 7.1 (H) 07/12/2016   Lab Results  Component Value Date   INSULIN 36.7 (H) 07/17/2019   Lab Results  Component Value Date   TSH 2.020 07/17/2019   Lab Results  Component Value Date   CHOL 157 07/17/2019   HDL 43 07/17/2019   LDLCALC 89 07/17/2019   TRIG 143 07/17/2019   CHOLHDL 3.6 07/12/2016   Lab Results  Component Value Date   WBC 7.1 09/04/2019   HGB 14.5 09/04/2019   HCT 45.1 09/04/2019   MCV 92 09/04/2019   PLT 234 09/04/2019   Lab Results  Component Value Date   FERRITIN 41 12/31/2014   Attestation Statements:  Reviewed by clinician on day of visit: allergies, medications, problem list, medical history, surgical history, family history, social history, and previous encounter notes.  Time spent on visit including pre-visit chart review and post-visit care and charting was 20 minutes.    I, Trixie Dredge, am acting as transcriptionist for Dennard Nip, MD.  I have reviewed the above documentation for accuracy and completeness, and I agree with the above. -  Dennard Nip, MD

## 2019-10-18 ENCOUNTER — Other Ambulatory Visit: Payer: Self-pay | Admitting: Cardiology

## 2019-10-18 MED ORDER — ENTRESTO 97-103 MG PO TABS: 1.0000 | ORAL_TABLET | Freq: Two times a day (BID) | ORAL | 0 refills | Status: DC

## 2019-11-10 DIAGNOSIS — J449 Chronic obstructive pulmonary disease, unspecified: Secondary | ICD-10-CM | POA: Diagnosis not present

## 2019-11-10 DIAGNOSIS — J4520 Mild intermittent asthma, uncomplicated: Secondary | ICD-10-CM | POA: Diagnosis not present

## 2019-11-13 ENCOUNTER — Ambulatory Visit (INDEPENDENT_AMBULATORY_CARE_PROVIDER_SITE_OTHER): Payer: Medicare HMO | Admitting: Family Medicine

## 2019-11-14 ENCOUNTER — Other Ambulatory Visit: Payer: Self-pay

## 2019-11-14 ENCOUNTER — Ambulatory Visit (INDEPENDENT_AMBULATORY_CARE_PROVIDER_SITE_OTHER): Payer: Medicare Other

## 2019-11-14 DIAGNOSIS — Z23 Encounter for immunization: Secondary | ICD-10-CM | POA: Diagnosis not present

## 2019-11-14 NOTE — Progress Notes (Signed)
   Covid-19 Vaccination Clinic  Name:  BRAILEE RIEDE    MRN: [id number] DOB: 18-Dec-1960  11/14/2019  Ms. Prothero was observed post Covid-19 immunization for 15 minutes without incident. She was provided with Vaccine Information Sheet and instruction to access the V-Safe system.   Ms. Vidrio was instructed to call 911 with any severe reactions post vaccine: Marland Kitchen Difficulty breathing  . Swelling of face and throat  . A fast heartbeat  . A bad rash all over body  . Dizziness and weakness

## 2019-12-04 ENCOUNTER — Ambulatory Visit (INDEPENDENT_AMBULATORY_CARE_PROVIDER_SITE_OTHER): Payer: Medicare HMO

## 2019-12-04 DIAGNOSIS — Z20822 Contact with and (suspected) exposure to covid-19: Secondary | ICD-10-CM

## 2019-12-04 NOTE — Progress Notes (Signed)
Patient Name: Judy [name] Date of Birth: 07/27/1960 MRN:  [id number]  Judy [name] is a 59 y.o. yo female presenting for COVID-19 testing.  She is being tested from the vehicle.  Judy [name] is being tested due to upcoming travel.  She has not been exposed to COVID as she is aware of nor does she have any symptoms.    Judy Bue, LPN 5:28 AM

## 2019-12-05 LAB — NOVEL CORONAVIRUS, NAA: SARS-CoV-2, NAA: NOT DETECTED

## 2019-12-05 LAB — SARS-COV-2, NAA 2 DAY TAT

## 2019-12-10 DIAGNOSIS — J449 Chronic obstructive pulmonary disease, unspecified: Secondary | ICD-10-CM | POA: Diagnosis not present

## 2019-12-10 DIAGNOSIS — J4520 Mild intermittent asthma, uncomplicated: Secondary | ICD-10-CM | POA: Diagnosis not present

## 2019-12-16 ENCOUNTER — Ambulatory Visit: Payer: Medicare HMO

## 2019-12-16 ENCOUNTER — Other Ambulatory Visit: Payer: Self-pay

## 2019-12-16 DIAGNOSIS — E1165 Type 2 diabetes mellitus with hyperglycemia: Secondary | ICD-10-CM | POA: Diagnosis not present

## 2019-12-16 DIAGNOSIS — E1169 Type 2 diabetes mellitus with other specified complication: Secondary | ICD-10-CM | POA: Diagnosis not present

## 2019-12-16 DIAGNOSIS — E785 Hyperlipidemia, unspecified: Secondary | ICD-10-CM

## 2019-12-16 DIAGNOSIS — I10 Essential (primary) hypertension: Secondary | ICD-10-CM | POA: Diagnosis not present

## 2019-12-17 LAB — COMPREHENSIVE METABOLIC PANEL
ALT: 32 IU/L (ref 0–32)
AST: 21 IU/L (ref 0–40)
Albumin/Globulin Ratio: 1.8 (ref 1.2–2.2)
Albumin: 4.2 g/dL (ref 3.8–4.9)
Alkaline Phosphatase: 183 IU/L — ABNORMAL HIGH (ref 44–121)
BUN/Creatinine Ratio: 28 — ABNORMAL HIGH (ref 9–23)
BUN: 19 mg/dL (ref 6–24)
Bilirubin Total: 0.5 mg/dL (ref 0.0–1.2)
CO2: 22 mmol/L (ref 20–29)
Calcium: 9.7 mg/dL (ref 8.7–10.2)
Chloride: 106 mmol/L (ref 96–106)
Creatinine, Ser: 0.68 mg/dL (ref 0.57–1.00)
GFR calc Af Amer: 111 mL/min/{1.73_m2} (ref >59)
GFR calc non Af Amer: 96 mL/min/{1.73_m2} (ref >59)
Globulin, Total: 2.3 g/dL (ref 1.5–4.5)
Glucose: 203 mg/dL — ABNORMAL HIGH (ref 65–99)
Potassium: 4.7 mmol/L (ref 3.5–5.2)
Sodium: 143 mmol/L (ref 134–144)
Total Protein: 6.5 g/dL (ref 6.0–8.5)

## 2019-12-17 LAB — LIPID PANEL
Chol/HDL Ratio: 4.3 ratio (ref 0.0–4.4)
Cholesterol, Total: 172 mg/dL (ref 100–199)
HDL: 40 mg/dL (ref >39)
LDL Chol Calc (NIH): 96 mg/dL (ref 0–99)
Triglycerides: 210 mg/dL — ABNORMAL HIGH (ref 0–149)
VLDL Cholesterol Cal: 36 mg/dL (ref 5–40)

## 2019-12-17 LAB — CBC WITH DIFFERENTIAL/PLATELET
Basophils Absolute: 0.1 10*3/uL (ref 0.0–0.2)
Basos: 1 %
EOS (ABSOLUTE): 0.3 10*3/uL (ref 0.0–0.4)
Eos: 3 %
Hematocrit: 46 % (ref 34.0–46.6)
Hemoglobin: 14.9 g/dL (ref 11.1–15.9)
Immature Grans (Abs): 0 10*3/uL (ref 0.0–0.1)
Immature Granulocytes: 1 %
Lymphocytes Absolute: 1.7 10*3/uL (ref 0.7–3.1)
Lymphs: 22 %
MCH: 29.9 pg (ref 26.6–33.0)
MCHC: 32.4 g/dL (ref 31.5–35.7)
MCV: 92 fL (ref 79–97)
Monocytes Absolute: 0.6 10*3/uL (ref 0.1–0.9)
Monocytes: 8 %
Neutrophils Absolute: 5.2 10*3/uL (ref 1.4–7.0)
Neutrophils: 65 %
Platelets: 224 10*3/uL (ref 150–450)
RBC: 4.99 x10E6/uL (ref 3.77–5.28)
RDW: 12.9 % (ref 11.7–15.4)
WBC: 7.9 10*3/uL (ref 3.4–10.8)

## 2019-12-17 LAB — HEMOGLOBIN A1C
Est. average glucose Bld gHb Est-mCnc: 174 mg/dL
Hgb A1c MFr Bld: 7.7 % — ABNORMAL HIGH (ref 4.8–5.6)

## 2019-12-17 LAB — CARDIOVASCULAR RISK ASSESSMENT

## 2019-12-18 ENCOUNTER — Other Ambulatory Visit: Payer: Self-pay

## 2019-12-18 ENCOUNTER — Ambulatory Visit (INDEPENDENT_AMBULATORY_CARE_PROVIDER_SITE_OTHER): Payer: Medicare HMO | Admitting: Family Medicine

## 2019-12-18 VITALS — BP 120/74 | HR 100 | Temp 97.7°F | Resp 20 | Ht 66.0 in | Wt 291.0 lb

## 2019-12-18 DIAGNOSIS — G4733 Obstructive sleep apnea (adult) (pediatric): Secondary | ICD-10-CM

## 2019-12-18 DIAGNOSIS — E7849 Other hyperlipidemia: Secondary | ICD-10-CM

## 2019-12-18 DIAGNOSIS — R0602 Shortness of breath: Secondary | ICD-10-CM

## 2019-12-18 DIAGNOSIS — I5022 Chronic systolic (congestive) heart failure: Secondary | ICD-10-CM

## 2019-12-18 DIAGNOSIS — J9611 Chronic respiratory failure with hypoxia: Secondary | ICD-10-CM | POA: Diagnosis not present

## 2019-12-18 DIAGNOSIS — G8929 Other chronic pain: Secondary | ICD-10-CM

## 2019-12-18 DIAGNOSIS — F410 Panic disorder [episodic paroxysmal anxiety] without agoraphobia: Secondary | ICD-10-CM | POA: Diagnosis not present

## 2019-12-18 DIAGNOSIS — E1165 Type 2 diabetes mellitus with hyperglycemia: Secondary | ICD-10-CM | POA: Diagnosis not present

## 2019-12-18 DIAGNOSIS — F411 Generalized anxiety disorder: Secondary | ICD-10-CM

## 2019-12-18 DIAGNOSIS — J449 Chronic obstructive pulmonary disease, unspecified: Secondary | ICD-10-CM

## 2019-12-18 DIAGNOSIS — R0902 Hypoxemia: Secondary | ICD-10-CM

## 2019-12-18 DIAGNOSIS — R0789 Other chest pain: Secondary | ICD-10-CM

## 2019-12-18 DIAGNOSIS — M545 Low back pain, unspecified: Secondary | ICD-10-CM

## 2019-12-18 DIAGNOSIS — I509 Heart failure, unspecified: Secondary | ICD-10-CM

## 2019-12-18 DIAGNOSIS — Z6841 Body Mass Index (BMI) 40.0 and over, adult: Secondary | ICD-10-CM

## 2019-12-18 MED ORDER — BUPROPION HCL ER (XL) 150 MG PO TB24: ORAL_TABLET | ORAL | 0 refills | Status: DC

## 2019-12-18 MED ORDER — ICOSAPENT ETHYL 1 G PO CAPS: 2.0000 g | ORAL_CAPSULE | Freq: Two times a day (BID) | ORAL | 1 refills | Status: DC

## 2019-12-18 MED ORDER — ATORVASTATIN CALCIUM 40 MG PO TABS: 40.0000 mg | ORAL_TABLET | Freq: Every day | ORAL | 1 refills | Status: DC

## 2019-12-18 MED ORDER — CLONAZEPAM 0.5 MG PO TABS: 0.5000 mg | ORAL_TABLET | Freq: Two times a day (BID) | ORAL | 0 refills | Status: DC | PRN

## 2019-12-18 NOTE — Patient Instructions (Addendum)
Depression/anxiety: Wellbutrin xl 150 mg once in am x 1 week, then increase to 300 mg once in am.  Clonazepam 0.5 mg one twice a day.  Back pain: Increase tramadol to 50 mg one twice a day.  Continue tizanidine 4 mg one every 8 hours as needed muscle pain.   Diabetes:  Continue farxiga 10 mg once daily Start on trulicity 0.75 mg once weekly x 2 weeks, then increase to 1.5 mg once weekly x 2 weeks. Check sugars daily in am.   High cholesterol: Increase lipitor 40 mg once daily.  Start on Vascepa 1 gm 2 capsules twice a day.    Panic Attack  A panic attack is when you suddenly feel very afraid, uncomfortable, or nervous (anxious). A panic attack can happen when you are scared or for no reason. A panic attack can feel like a serious problem. It can even feel like a heart attack or stroke. See your doctor when you have a panic attack to make sure you do not have a serious problem. Follow these instructions at home:  Take medicines only as told by your doctor.  If you feel worried or nervous, try not to have caffeine.  Take good care of your health. To do this: ? Eat healthy. Make sure to eat fresh fruits and vegetables, whole grains, lean meats, and low-fat dairy. ? Get enough sleep. Try to sleep for 7-8 hours each night. ? Exercise. Try to be active for 30 minutes 5 or more days a week. ? Do not smoke. Talk to your doctor if you need help quitting. ? Limit how much alcohol you drink:  If you are a woman who is not pregnant: try not to have more than 1 drink a day.  If you are a man: try not to have more than 2 drinks a day.  One drink equals 12 oz of beer, 5 oz of wine, or 1 oz of hard liquor.  Keep all follow-up visits as told by your doctor. This is important. Contact a doctor if:  Your symptoms do not get better.  Your symptoms get worse.  You are not able to take your medicines as told. Get help right away if:  You have thoughts of hurting yourself or  others.  You have symptoms of a panic attack. Do not drive yourself to the hospital. Have someone else drive you or call an ambulance. If you feel like you may hurt yourself or others, or have thoughts about taking your own life, get help right away. You can go to your nearest emergency department or call:  Your local emergency services (911 in the [place]  A suicide crisis helpline, such as the National Suicide Prevention Lifeline at 929-337-6162. This is open 24 hours a day. Summary  A panic attack is when you suddenly feel very afraid, uncomfortable, or nervous (anxious).  See your doctor when you have a panic attack to make sure that you do not have another serious problem.  If you feel like you may hurt yourself or others, get help right away by calling 911. This information is not intended to replace advice given to you by your health care provider. Make sure you discuss any questions you have with your health care provider. Document Revised: 01/13/2017 Document Reviewed: 03/16/2016 Elsevier Patient Education  2020 [employer].   Diabetes Mellitus and Nutrition, Adult When you have diabetes (diabetes mellitus), it is very important to have healthy eating habits because your blood sugar (glucose) levels  are greatly affected by what you eat and drink. Eating healthy foods in the appropriate amounts, at about the same times every day, can help you:  Control your blood glucose.  Lower your risk of heart disease.  Improve your blood pressure.  Reach or maintain a healthy weight. Every person with diabetes is different, and each person has different needs for a meal plan. Your health care provider may recommend that you work with a diet and nutrition specialist (dietitian) to make a meal plan that is best for you. Your meal plan may vary depending on factors such as:  The calories you need.  The medicines you take.  Your weight.  Your blood glucose, blood pressure, and  cholesterol levels.  Your activity level.  Other health conditions you have, such as heart or kidney disease. How do carbohydrates affect me? Carbohydrates, also called carbs, affect your blood glucose level more than any other type of food. Eating carbs naturally raises the amount of glucose in your blood. Carb counting is a method for keeping track of how many carbs you eat. Counting carbs is important to keep your blood glucose at a healthy level, especially if you use insulin or take certain oral diabetes medicines. It is important to know how many carbs you can safely have in each meal. This is different for every person. Your dietitian can help you calculate how many carbs you should have at each meal and for each snack. Foods that contain carbs include:  Bread, cereal, rice, pasta, and crackers.  Potatoes and corn.  Peas, beans, and lentils.  Milk and yogurt.  Fruit and juice.  Desserts, such as cakes, cookies, ice cream, and candy. How does alcohol affect me? Alcohol can cause a sudden decrease in blood glucose (hypoglycemia), especially if you use insulin or take certain oral diabetes medicines. Hypoglycemia can be a life-threatening condition. Symptoms of hypoglycemia (sleepiness, dizziness, and confusion) are similar to symptoms of having too much alcohol. If your health care provider says that alcohol is safe for you, follow these guidelines:  Limit alcohol intake to no more than 1 drink per day for nonpregnant women and 2 drinks per day for men. One drink equals 12 oz of beer, 5 oz of wine, or 1 oz of hard liquor.  Do not drink on an empty stomach.  Keep yourself hydrated with water, diet soda, or unsweetened iced tea.  Keep in mind that regular soda, juice, and other mixers may contain a lot of sugar and must be counted as carbs. What are tips for following this plan?  Reading food labels  Start by checking the serving size on the "Nutrition Facts" label of packaged  foods and drinks. The amount of calories, carbs, fats, and other nutrients listed on the label is based on one serving of the item. Many items contain more than one serving per package.  Check the total grams (g) of carbs in one serving. You can calculate the number of servings of carbs in one serving by dividing the total carbs by 15. For example, if a food has 30 g of total carbs, it would be equal to 2 servings of carbs.  Check the number of grams (g) of saturated and trans fats in one serving. Choose foods that have low or no amount of these fats.  Check the number of milligrams (mg) of salt (sodium) in one serving. Most people should limit total sodium intake to less than 2,300 mg per day.  Always check  the nutrition information of foods labeled as "low-fat" or "nonfat". These foods may be higher in added sugar or refined carbs and should be avoided.  Talk to your dietitian to identify your daily goals for nutrients listed on the label. Shopping  Avoid buying canned, premade, or processed foods. These foods tend to be high in fat, sodium, and added sugar.  Shop around the outside edge of the grocery store. This includes fresh fruits and vegetables, bulk grains, fresh meats, and fresh dairy. Cooking  Use low-heat cooking methods, such as baking, instead of high-heat cooking methods like deep frying.  Cook using healthy oils, such as olive, canola, or sunflower oil.  Avoid cooking with butter, cream, or high-fat meats. Meal planning  Eat meals and snacks regularly, preferably at the same times every day. Avoid going long periods of time without eating.  Eat foods high in fiber, such as fresh fruits, vegetables, beans, and whole grains. Talk to your dietitian about how many servings of carbs you can eat at each meal.  Eat 4-6 ounces (oz) of lean protein each day, such as lean meat, chicken, fish, eggs, or tofu. One oz of lean protein is equal to: ? 1 oz of meat, chicken, or fish. ? 1  egg. ?  cup of tofu.  Eat some foods each day that contain healthy fats, such as avocado, nuts, seeds, and fish. Lifestyle  Check your blood glucose regularly.  Exercise regularly as told by your health care provider. This may include: ? 150 minutes of moderate-intensity or vigorous-intensity exercise each week. This could be brisk walking, biking, or water aerobics. ? Stretching and doing strength exercises, such as yoga or weightlifting, at least 2 times a week.  Take medicines as told by your health care provider.  Do not use any products that contain nicotine or tobacco, such as cigarettes and e-cigarettes. If you need help quitting, ask your health care provider.  Work with a Veterinary surgeon or diabetes educator to identify strategies to manage stress and any emotional and social challenges. Questions to ask a health care provider  Do I need to meet with a diabetes educator?  Do I need to meet with a dietitian?  What number can I call if I have questions?  When are the best times to check my blood glucose? Where to find more information:  American Diabetes Association: diabetes.org  Academy of Nutrition and Dietetics: www.eatright.[employer] of Diabetes and Digestive and Kidney Diseases (NIH): Car[employer].tn Summary  A healthy meal plan will help you control your blood glucose and maintain a healthy lifestyle.  Working with a diet and nutrition specialist (dietitian) can help you make a meal plan that is best for you.  Keep in mind that carbohydrates (carbs) and alcohol have immediate effects on your blood glucose levels. It is important to count carbs and to use alcohol carefully. This information is not intended to replace advice given to you by your health care provider. Make sure you discuss any questions you have with your health care provider. Document Revised: 01/13/2017 Document Reviewed: 03/07/2016 Elsevier Patient Education  2020 [employer] High Cholesterol  High cholesterol is a condition in which the blood has high levels of a white, waxy, fat-like substance (cholesterol). The human body needs small amounts of cholesterol. The liver makes all the cholesterol that the body needs. Extra (excess) cholesterol comes from the food that we eat. Cholesterol is carried from the liver by the blood through the blood vessels. If  you have high cholesterol, deposits (plaques) may build up on the walls of your blood vessels (arteries). Plaques make the arteries narrower and stiffer. Cholesterol plaques increase your risk for heart attack and stroke. Work with your health care provider to keep your cholesterol levels in a healthy range. What increases the risk? This condition is more likely to develop in people who:  Eat foods that are high in animal fat (saturated fat) or cholesterol.  Are overweight.  Are not getting enough exercise.  Have a family history of high cholesterol. What are the signs or symptoms? There are no symptoms of this condition. How is this diagnosed? This condition may be diagnosed from the results of a blood test.  If you are older than age 71, your health care provider may check your cholesterol every 4-6 years.  You may be checked more often if you already have high cholesterol or other risk factors for heart disease. The blood test for cholesterol measures:  "Bad" cholesterol (LDL cholesterol). This is the main type of cholesterol that causes heart disease. The desired level for LDL is less than 55.  "Good" cholesterol (HDL cholesterol). This type helps to protect against heart disease by cleaning the arteries and carrying the LDL away. The desired level for HDL is 60 or higher.  Triglycerides. These are fats that the body can store or burn for energy. The desired number for triglycerides is lower than 150.  Total cholesterol. This is a measure of the total amount of cholesterol in your blood, including  LDL cholesterol, HDL cholesterol, and triglycerides. A healthy number is less than 200. How is this treated? This condition is treated with diet changes, lifestyle changes, and medicines. Diet changes  This may include eating more whole grains, fruits, vegetables, nuts, and fish.  This may also include cutting back on red meat and foods that have a lot of added sugar. Lifestyle changes  Changes may include getting at least 40 minutes of aerobic exercise 3 times a week. Aerobic exercises include walking, biking, and swimming. Aerobic exercise along with a healthy diet can help you maintain a healthy weight.  Changes may also include quitting smoking. Medicines  Medicines are usually given if diet and lifestyle changes have failed to reduce your cholesterol to healthy levels.  Your health care provider may prescribe a statin medicine. Statin medicines have been shown to reduce cholesterol, which can reduce the risk of heart disease. Follow these instructions at home: Eating and drinking If told by your health care provider:  Eat chicken (without skin), fish, veal, shellfish, ground [place] breast, and round or loin cuts of red meat.  Do not eat fried foods or fatty meats, such as hot dogs and salami.  Eat plenty of fruits, such as apples.  Eat plenty of vegetables, such as broccoli, potatoes, and carrots.  Eat beans, peas, and lentils.  Eat grains such as barley, rice, couscous, and bulgur wheat.  Eat pasta without cream sauces.  Use skim or nonfat milk, and eat low-fat or nonfat yogurt and cheeses.  Do not eat or drink whole milk, cream, ice cream, egg yolks, or hard cheeses.  Do not eat stick margarine or tub margarines that contain trans fats (also called partially hydrogenated oils).  Do not eat saturated tropical oils, such as coconut oil and palm oil.  Do not eat cakes, cookies, crackers, or other baked goods that contain trans fats.  General instructions  Exercise  as directed by your health care provider. Increase  your activity level with activities such as gardening, walking, and taking the stairs.  Take over-the-counter and prescription medicines only as told by your health care provider.  Do not use any products that contain nicotine or tobacco, such as cigarettes and e-cigarettes. If you need help quitting, ask your health care provider.  Keep all follow-up visits as told by your health care provider. This is important. Contact a health care provider if:  You are struggling to maintain a healthy diet or weight.  You need help to start on an exercise program.  You need help to stop smoking. Get help right away if:  You have chest pain.  You have trouble breathing. This information is not intended to replace advice given to you by your health care provider. Make sure you discuss any questions you have with your health care provider. Document Revised: 02/03/2017 Document Reviewed: 08/01/2015 Elsevier Patient Education  2020 [employer].

## 2019-12-18 NOTE — Progress Notes (Addendum)
Subjective:  Patient ID: Judy [name], female    DOB: 1960/10/03  Age: 59 y.o. MRN: [id number]  Chief Complaint  Patient presents with  . Diabetes  . Hypothyroidism  . Hyperlipidemia  . Hypertension    HPI Diabetes: Patient presents for follow-up.  She came in prior to her appointment for blood work.  Her last A1c was 7.7 on December 16, 2019.  She has not been checking her sugars regularly.  She intermittently has tried eat healthy.  She has difficulty exercising due to her lung and heart disease.  Her diabetes medicines include fark Sica 10 mg once daily.  She was intolerant to Metformin.  It caused severe diarrhea.  Hypothyroidism: Patient is currently on Synthroid 75 mcg once daily.  Congestive heart failure related to idiopathic cardiomyopathy.  Patient has done excellent on Entresto 97-103 mg twice daily.  She is also on carvedilol 12.5 mg twice daily, and aspirin 81 mg once daily. Pt sees Dr. Mayford Knife.  Hyperlipidemia: Triglycerides were 210, LDL 96, HDL 40.  She has had a left heart cath in 2016 which was normal.  COPD: Patient is currently on Stiolto 2 puffs daily. Sees Dr. Sherene Sires.  OSA: Currently on CPAP. She is on 2 Liters of oxygen which she bleeds in to her cpap.  She receives benefit and is compliant with treatment. Sees Dr. Sherene Sires.  Panic attacks: Dyspnea with rest. Triggered by thinking about her [name].  He is a relapsing drug addict who is very manipulative with [name] and her husband.  She experienced pressure in center of chest. No referring pain.  No diaphoresis, nausea, vomiting..  Patient denies depression however does feel very overwhelmed.  Chronic back pain without sciatica/Scoliosis. Pain is 6/10. Pt is in constant pain. Takes tizanidine 4 mg once at night. Takes tylenol, but does not help. Ran out of tramadol months ago.   Current Outpatient Medications on File Prior to Visit  Medication Sig Dispense Refill  . aspirin 81 MG tablet Take 81 mg by mouth at  bedtime.     . carvedilol (COREG) 12.5 MG tablet TAKE 1 TABLET TWICE DAILY WITH A MEAL 180 tablet 3  . Cholecalciferol (VITAMIN D-3) 5000 units TABS Take 1 capsule by mouth daily.    . Coenzyme Q10 (CO Q 10) 100 MG CAPS Take 200 mg by mouth daily.    Marland Kitchen FARXIGA 10 MG TABS tablet Take 10 mg by mouth daily.    Marland Kitchen gabapentin (NEURONTIN) 400 MG capsule Take 2 tablets by mouth in the AM and 2 by mouth in the PM  1  . levothyroxine (SYNTHROID) 75 MCG tablet Take 1 tablet (75 mcg total) by mouth daily. 90 tablet 1  . Lutein 20 MG TABS Take 1 tablet by mouth daily.    . Multiple Vitamin (MULTIVITAMIN WITH MINERALS) TABS tablet Take 1 tablet by mouth daily.    . OXYGEN 2lpm 2 lpm as needed    . sacubitril-valsartan (ENTRESTO) 97-103 MG Take 1 tablet by mouth 2 (two) times daily. 180 tablet 0  . STIOLTO RESPIMAT 2.5-2.5 MCG/ACT AERS INHALE 2 PUFFS INTO THE LUNGS DAILY. 12 g 5  . tiZANidine (ZANAFLEX) 4 MG tablet Take 4 mg by mouth daily.    . traMADol (ULTRAM) 50 MG tablet Take 1 tablet by mouth every 6 (six) hours as needed.  0  . UNABLE TO FIND CPAP with o2 2lpm  DME- [facility]    . Vitamin D, Ergocalciferol, (DRISDOL) 1.25 MG (50000 UNIT) CAPS  capsule Take 1 capsule (50,000 Units total) by mouth every 7 (seven) days. 12 capsule 1   No current facility-administered medications on file prior to visit.   Past Medical History:  Diagnosis Date  . Anxiety   . Back pain   . Bilateral swelling of feet   . Chronic combined systolic and diastolic CHF (congestive heart failure) (HCC) 10/07/2014  . COPD (chronic obstructive pulmonary disease) (HCC)   . DCM (dilated cardiomyopathy) (HCC) 04/25/2014   EF 28% by MRI despite maximum medical therapy  . Depression   . Diabetes mellitus without complication (HCC)   . Epilepsy (HCC)    as a child  . Fatty liver   . Former tobacco use   . Hyperlipidemia   . Hypertension   . Hypothyroidism   . Joint pain   . Leg swelling   . Morbid obesity (HCC)   . NICM  (nonischemic cardiomyopathy) (HCC)   . OSA (obstructive sleep apnea)    moderate with AHI 26/hr with oxygen desaturations as low as 70%  . Scoliosis   . Sleep apnea   . SOB (shortness of breath)    Past Surgical History:  Procedure Laterality Date  . ABDOMINAL HYSTERECTOMY    . ankle artery involved cyst removal    . CARDIAC CATHETERIZATION  05/14/2014   normal coronary arteries  . CARPAL TUNNEL RELEASE    . FASCIOTOMY     1993 for platar fasciitis  . harrington rod scoliosis     1981  . LEFT HEART CATHETERIZATION WITH CORONARY ANGIOGRAM N/A 05/14/2014   Procedure: LEFT HEART CATHETERIZATION WITH CORONARY ANGIOGRAM;  Surgeon: Iran Ouch, MD;  Location: [facility];  Service: Cardiovascular;  Laterality: N/A;  . ROTATOR CUFF REPAIR    . UMBILICAL HERNIA REPAIR    . VESICOVAGINAL FISTULA CLOSURE W/ TAH      Family History  Problem Relation Age of Onset  . Emphysema Father   . Allergies Father   . Heart failure Father   . Heart disease Father 63       MI  . Diabetes Father   . Hyperlipidemia Father   . Hypertension Father   . Thyroid disease Father   . Alcohol abuse Father   . Obesity Father   . Diabetes Mother   . Hyperlipidemia Mother   . Obesity Mother    Social History   Socioeconomic History  . Marital status: Married    Spouse name: Not on file  . Number of children: Not on file  . Years of education: Not on file  . Highest education level: Not on file  Occupational History  . Occupation: disabled  Tobacco Use  . Smoking status: Former Smoker    Packs/day: 1.00    Years: 15.00    Pack years: 15.00    Types: Cigarettes    Quit date: 02/14/2005    Years since quitting: 14.8  . Smokeless tobacco: Never Used  Vaping Use  . Vaping Use: Never used  Substance and Sexual Activity  . Alcohol use: Yes    Comment: occ  . Drug use: No  . Sexual activity: Not on file  Other Topics Concern  . Not on file  Social History Narrative   Lives in [place] with  spouse and son.   Previously worked as a Comptroller         Social Determinants of Corporate investment banker Strain:   . Difficulty of Paying Living Expenses: Not on  file  Food Insecurity:   . Worried About Programme researcher, broadcasting/film/video in the Last Year: Not on file  . Ran Out of Food in the Last Year: Not on file  Transportation Needs:   . Lack of Transportation (Medical): Not on file  . Lack of Transportation (Non-Medical): Not on file  Physical Activity:   . Days of Exercise per Week: Not on file  . Minutes of Exercise per Session: Not on file  Stress:   . Feeling of Stress : Not on file  Social Connections:   . Frequency of Communication with Friends and Family: Not on file  . Frequency of Social Gatherings with Friends and Family: Not on file  . Attends Religious Services: Not on file  . Active Member of Clubs or Organizations: Not on file  . Attends Banker Meetings: Not on file  . Marital Status: Not on file    Review of Systems  Constitutional: Positive for fatigue. Negative for chills and fever.  HENT: Negative for congestion, rhinorrhea and sore throat.   Eyes: Positive for visual disturbance.  Respiratory: Negative for cough and shortness of breath.   Cardiovascular: Negative for chest pain.  Gastrointestinal: Positive for diarrhea (All day yesterday). Negative for abdominal pain, constipation, nausea and vomiting.  Genitourinary: Negative for dysuria and urgency.       Stress incontinence  Musculoskeletal: Positive for back pain (Severe.  Takes tizanidine 4 mg 1 at night.  Has been out of her tramadol.  She is on gabapentin.) and myalgias.  Neurological: Positive for weakness and headaches. Negative for dizziness and light-headedness.  Psychiatric/Behavioral: Negative for dysphoric mood. The patient is not nervous/anxious.      Objective:  BP 120/74   Pulse 100   Temp 97.7 F (36.5 C)   Resp 20   Ht 5\' 6"  (1.676 m)   Wt 291 lb (132  kg)   SpO2 94%   BMI 46.97 kg/m   BP/Weight 12/18/2019 10/17/2019 09/03/2019  Systolic BP 120 104 106  Diastolic BP 74 60 72  Wt. (Lbs) 291 284 290  BMI 46.97 45.84 48.26    Physical Exam Vitals reviewed.  Constitutional:      Appearance: Normal appearance. She is obese.  Neck:     Vascular: No carotid bruit.  Cardiovascular:     Rate and Rhythm: Normal rate and regular rhythm.     Pulses: Normal pulses.     Heart sounds: Normal heart sounds.  Pulmonary:     Effort: Pulmonary effort is normal. No respiratory distress.     Breath sounds: Normal breath sounds.  Abdominal:     General: Abdomen is flat. Bowel sounds are normal.     Palpations: Abdomen is soft.     Tenderness: There is no abdominal tenderness.  Musculoskeletal:     Right lower leg: No edema.     Left lower leg: No edema.  Neurological:     Mental Status: She is alert and oriented to person, place, and time.  Psychiatric:        Behavior: Behavior normal.     Comments: Anxious, tearful     Diabetic Foot Exam - Simple   Simple Foot Form Diabetic Foot exam was performed with the following findings: Yes 12/18/2019 11:35 AM  Visual Inspection No deformities, no ulcerations, no other skin breakdown bilaterally: Yes Sensation Testing See comments: Yes Pulse Check Posterior Tibialis and Dorsalis pulse intact bilaterally: Yes Comments Decreased sensation on first and second digits of  bilateral feet on the plantar surfaces.      Lab Results  Component Value Date   WBC 7.9 12/16/2019   HGB 14.9 12/16/2019   HCT 46.0 12/16/2019   PLT 224 12/16/2019   GLUCOSE 203 (H) 12/16/2019   CHOL 172 12/16/2019   TRIG 210 (H) 12/16/2019   HDL 40 12/16/2019   LDLCALC 96 12/16/2019   ALT 32 12/16/2019   AST 21 12/16/2019   NA 143 12/16/2019   K 4.7 12/16/2019   CL 106 12/16/2019   CREATININE 0.68 12/16/2019   BUN 19 12/16/2019   CO2 22 12/16/2019   TSH 2.020 07/17/2019   INR 1.0 05/13/2014   HGBA1C 7.7 (H)  12/16/2019   MICROALBUR Negative 09/03/2019      Assessment & Plan:   1. Type 2 diabetes mellitus with hyperglycemia, without long-term current use of insulin (HCC) Control: poor Recommend check sugars fasting daily. Recommend check feet daily. Recommend annual eye exams. Medicines: Start on trulicity 0.75 mg once weekly x 2 weeks, then increase to 1.5 mg once weekly. Sampes given.  Continue Farxiga 10 mg once daily. Continue to work on eating a healthy diet and exercise.  Labs reviewed.  2. Class 3 severe obesity with serious comorbidity and body mass index (BMI) of 45.0 to 49.9 in adult, unspecified obesity type (HCC) Recommend work on eating healthy-low-fat, low-salt, low sugar diet. Recommend start exercising on her recumbent bicycle.  3. Other hyperlipidemia Poorly controlled Increase Lipitor to 40 mg once daily. Recommend start Vascepa 1 g 2 capsules twice daily Continue to work on eating a healthy diet and exercise.  Labs drawn today.  - atorvastatin (LIPITOR) 40 MG tablet; Take 1 tablet (40 mg total) by mouth daily.  Dispense: 90 tablet; Refill: 1 - icosapent Ethyl (VASCEPA) 1 g capsule; Take 2 capsules (2 g total) by mouth 2 (two) times daily.  Dispense: 360 capsule; Refill: 1  4. Chronic systolic congestive heart failure (HCC) Continue current medications. Keep follow-up with Dr. Mayford Knife.  5. SOB (shortness of breath) on exertion EKG: Normal sinus rhythm no ST changes.  6. GAD (generalized anxiety disorder) - buPROPion (WELLBUTRIN XL) 150 MG 24 hr tablet; One in am x 1 week, then increase to 2 in am.  Dispense: 50 tablet; Refill: 0  7. Panic attack Start on Wellbutrin XL 150 mg once in a.m. x1 week, then increase to 2 in a.m. Given clonazepam 0.5 mg milligrams 1 tablet twice daily as needed for anxiety.  I did recommend the patient take it twice a day for the first week as she is having nearly 3 panic attacks daily currently.  I also recommended that she and her  husband seek counseling with Dr. Aquilla Solian which she agreed to do. - clonazePAM (KLONOPIN) 0.5 MG tablet; Take 1 tablet (0.5 mg total) by mouth 2 (two) times daily as needed for anxiety (panic attacks).  Dispense: 60 tablet; Refill: 0  8. Other chest pain likely due to panic attacks. - EKG 12-Lead  9. Chronic respiratory failure with hypoxia (HCC) Uses oxygen at night 2 L. Patient feels she may not need this anymore.  I instructed her to discuss with Dr. Sherene Sires.  10. Oxygen deficit Continue 2 L at night  11. COPD GOLD  III  Continue current management with Dr. Sherene Sires. No changes to medications.  12. OSA (obstructive sleep apnea) Continue CPAP. Patient benefits and is compliant with use.  13. Chronic lumbar back pain with scoliosis Poorly controlled. Increase tramadol to  50 mg one twice a day.  Continue tizanidine 4 mg one every 8 hours as needed muscle pain.   Meds ordered this encounter  Medications  . buPROPion (WELLBUTRIN XL) 150 MG 24 hr tablet    Sig: One in am x 1 week, then increase to 2 in am.    Dispense:  50 tablet    Refill:  0  . clonazePAM (KLONOPIN) 0.5 MG tablet    Sig: Take 1 tablet (0.5 mg total) by mouth 2 (two) times daily as needed for anxiety (panic attacks).    Dispense:  60 tablet    Refill:  0  . atorvastatin (LIPITOR) 40 MG tablet    Sig: Take 1 tablet (40 mg total) by mouth daily.    Dispense:  90 tablet    Refill:  1  . icosapent Ethyl (VASCEPA) 1 g capsule    Sig: Take 2 capsules (2 g total) by mouth 2 (two) times daily.    Dispense:  360 capsule    Refill:  1    Orders Placed This Encounter  Procedures  . EKG 12-Lead     Follow-up: Return in about 3 weeks (around 01/08/2020) for anxiety/depression.  An After Visit Summary was printed and given to the patient.  Blane Ohara [facility] [phone]

## 2019-12-22 ENCOUNTER — Encounter: Payer: Self-pay | Admitting: Family Medicine

## 2019-12-24 DIAGNOSIS — F4323 Adjustment disorder with mixed anxiety and depressed mood: Secondary | ICD-10-CM | POA: Diagnosis not present

## 2019-12-25 ENCOUNTER — Other Ambulatory Visit: Payer: Self-pay

## 2019-12-25 ENCOUNTER — Telehealth: Payer: Self-pay | Admitting: *Deleted

## 2019-12-25 ENCOUNTER — Ambulatory Visit: Payer: Medicare HMO | Admitting: Cardiology

## 2019-12-25 VITALS — BP 118/77 | HR 79 | Ht 66.0 in | Wt 292.8 lb

## 2019-12-25 DIAGNOSIS — G4733 Obstructive sleep apnea (adult) (pediatric): Secondary | ICD-10-CM

## 2019-12-25 DIAGNOSIS — I420 Dilated cardiomyopathy: Secondary | ICD-10-CM

## 2019-12-25 DIAGNOSIS — I5032 Chronic diastolic (congestive) heart failure: Secondary | ICD-10-CM | POA: Diagnosis not present

## 2019-12-25 DIAGNOSIS — I10 Essential (primary) hypertension: Secondary | ICD-10-CM | POA: Diagnosis not present

## 2019-12-25 NOTE — Telephone Encounter (Signed)
-----   Message from Quintella Reichert, MD sent at 12/25/2019 11:18 AM EST ----- Please order a ResMed  CPAP at 13cm H2O with heated humidity and mask of choice >>insurance will pay at end of December.  Needs televisit 8 weeks after she gets new device.

## 2019-12-25 NOTE — Progress Notes (Signed)
Date:  12/25/2019   ID:  DONYE DAUENHAUER, DOB [birth year], MRN [id number]  PCP:  Blane Ohara, MD  Cardiologist:  Armanda Magic, MD Electrophysiologist:  None   Chief Complaint:  OSA, HICM, CHF, HTN, HLD  History of Present Illness:    Judy [name] is a 59 y.o. female with a hx of NICM, chronic combined CHF,OSA on PAP,COPD, former tobacco abuse, HTN, HLD, morbid obesity. Prior echo 12/2013 showed EF 45-50%. She had abnormal stress test 04/2014 resulting in cardiac cath 05/14/14 showing no significant CAD, EF 30%.   Subsequent echo 08/2014 (poor quality) with EF approximately 35%. She was treated with medical therapy and subsequent cMRI 09/2014 showed diffuse HK, EF 28%, no infiltrative disease.  AICD recommended but she had been hesitant to pursue. F/U echo was obtained 07/29/16 showed EF approximately 45-50%, mild LVH, grade 2 DD, mild MR, mildly reduced RV function, PASP .  Repeat echo 06/19/2017 showed normalization of LV function with EF 55%.  She is here today for followup and is doing well.  She denies any chest pain or pressure, SOB, DOE, PND, orthopnea, LE edema, dizziness, palpitations or syncope. She is compliant with her meds and is tolerating meds with no SE.    She is doing well with her CPAP device and thinks that she has gotten used to it.  She tolerates the mask and feels the pressure is adequate.  Since going on CPAP she feels rested in the am and has no significant daytime sleepiness.  She denies any significant mouth or nasal dryness or nasal congestion.  She does not think that he snores.     Prior CV studies:   The following studies were reviewed today:  PAP compliance report  Past Medical History:  Diagnosis Date  . Anxiety   . Back pain   . Bilateral swelling of feet   . Chronic combined systolic and diastolic CHF (congestive heart failure) (HCC) 10/07/2014  . COPD (chronic obstructive pulmonary disease) (HCC)   . DCM (dilated cardiomyopathy) (HCC)  04/25/2014   EF 28% by MRI despite maximum medical therapy  . Depression   . Diabetes mellitus without complication (HCC)   . Epilepsy (HCC)    as a child  . Fatty liver   . Former tobacco use   . Hyperlipidemia   . Hypertension   . Hypothyroidism   . Joint pain   . Leg swelling   . Morbid obesity (HCC)   . NICM (nonischemic cardiomyopathy) (HCC)   . OSA (obstructive sleep apnea)    moderate with AHI 26/hr with oxygen desaturations as low as 70%  . Scoliosis   . Sleep apnea   . SOB (shortness of breath)    Past Surgical History:  Procedure Laterality Date  . ABDOMINAL HYSTERECTOMY    . ankle artery involved cyst removal    . CARDIAC CATHETERIZATION  05/14/2014   normal coronary arteries  . CARPAL TUNNEL RELEASE    . FASCIOTOMY     1993 for platar fasciitis  . harrington rod scoliosis     1981  . LEFT HEART CATHETERIZATION WITH CORONARY ANGIOGRAM N/A 05/14/2014   Procedure: LEFT HEART CATHETERIZATION WITH CORONARY ANGIOGRAM;  Surgeon: Iran Ouch, MD;  Location: [facility]ATH [facility];  Service: Cardiovascular;  Laterality: N/A;  . ROTATOR CUFF REPAIR    . UMBILICAL HERNIA REPAIR    . VESICOVAGINAL FISTULA CLOSURE W/ TAH       Current Meds  Medication Sig  . aspirin 81 MG  tablet Take 81 mg by mouth at bedtime.   Marland Kitchen atorvastatin (LIPITOR) 40 MG tablet Take 1 tablet (40 mg total) by mouth daily.  Marland Kitchen buPROPion (WELLBUTRIN XL) 150 MG 24 hr tablet One in am x 1 week, then increase to 2 in am.  . carvedilol (COREG) 12.5 MG tablet TAKE 1 TABLET TWICE DAILY WITH A MEAL  . Cholecalciferol (VITAMIN D-3) 5000 units TABS Take 1 capsule by mouth daily.  . Coenzyme Q10 (CO Q 10) 100 MG CAPS Take 200 mg by mouth daily.  . Dulaglutide (TRULICITY) 0.75 MG/0.5ML SOPN Inject into the skin once a week.  Marland Kitchen FARXIGA 10 MG TABS tablet Take 10 mg by mouth daily.  Marland Kitchen gabapentin (NEURONTIN) 400 MG capsule Take 2 tablets by mouth in the AM and 2 by mouth in the PM  . icosapent Ethyl (VASCEPA) 1 g capsule  Take 2 capsules (2 g total) by mouth 2 (two) times daily.  Marland Kitchen levothyroxine (SYNTHROID) 75 MCG tablet Take 1 tablet (75 mcg total) by mouth daily.  . Lutein 20 MG TABS Take 1 tablet by mouth daily.  . Multiple Vitamin (MULTIVITAMIN WITH MINERALS) TABS tablet Take 1 tablet by mouth daily.  . OXYGEN 2lpm 2 lpm as needed  . sacubitril-valsartan (ENTRESTO) 97-103 MG Take 1 tablet by mouth 2 (two) times daily.  Marland Kitchen STIOLTO RESPIMAT 2.5-2.5 MCG/ACT AERS INHALE 2 PUFFS INTO THE LUNGS DAILY.  Marland Kitchen tiZANidine (ZANAFLEX) 4 MG tablet Take 4 mg by mouth daily.  . traMADol (ULTRAM) 50 MG tablet Take 1 tablet by mouth every 6 (six) hours as needed.  Marland Kitchen UNABLE TO FIND CPAP with o2 2lpm  DME- [facility]  . Vitamin D, Ergocalciferol, (DRISDOL) 1.25 MG (50000 UNIT) CAPS capsule Take 1 capsule (50,000 Units total) by mouth every 7 (seven) days.     Allergies:   Demerol [meperidine]   Social History   Tobacco Use  . Smoking status: Former Smoker    Packs/day: 1.00    Years: 15.00    Pack years: 15.00    Types: Cigarettes    Quit date: 02/14/2005    Years since quitting: 14.8  . Smokeless tobacco: Never Used  Vaping Use  . Vaping Use: Never used  Substance Use Topics  . Alcohol use: Yes    Comment: occ  . Drug use: No     Family Hx: The patient's family history includes Alcohol abuse in her father; Allergies in her father; Diabetes in her father and mother; Emphysema in her father; Heart disease (age of onset: 78) in her father; Heart failure in her father; Hyperlipidemia in her father and mother; Hypertension in her father; Obesity in her father and mother; Thyroid disease in her father.  ROS:   Please see the history of present illness.     All other systems reviewed and are negative.   Labs/Other Tests and Data Reviewed:    Recent Labs: 07/17/2019: TSH 2.020 12/16/2019: ALT 32; BUN 19; Creatinine, Ser 0.68; Hemoglobin 14.9; Platelets 224; Potassium 4.7; Sodium 143   Recent Lipid Panel Lab Results   Component Value Date/Time   CHOL 172 12/16/2019 09:38 AM   TRIG 210 (H) 12/16/2019 09:38 AM   HDL 40 12/16/2019 09:38 AM   CHOLHDL 4.3 12/16/2019 09:38 AM   CHOLHDL 3.6 07/12/2016 11:15 AM   LDLCALC 96 12/16/2019 09:38 AM    Wt Readings from Last 3 Encounters:  12/25/19 292 lb 12.8 oz (132.8 kg)  12/18/19 291 lb (132 kg)  10/17/19 284 lb (  128.8 kg)     Objective:    Vital Signs:  BP 118/77   Pulse 79   Ht 5\' 6"  (1.676 m)   Wt 292 lb 12.8 oz (132.8 kg)   BMI 47.26 kg/m    GEN: Well nourished, well developed in no acute distress HEENT: Normal NECK: No JVD; No carotid bruits LYMPHATICS: No lymphadenopathy CARDIAC:RRR, no murmurs, rubs, gallops RESPIRATORY:  Clear to auscultation without rales, wheezing or rhonchi  ABDOMEN: Soft, non-tender, non-distended MUSCULOSKELETAL:  No edema; No deformity  SKIN: Warm and dry NEUROLOGIC:  Alert and oriented x 3 PSYCHIATRIC:  Normal affect    ASSESSMENT & PLAN:    1.  Chronic diastolic CHF -she is currently NYHA class 2b -she has chronic DOE which is stable and multifactorial from CHF as well as morbid obesity -her weight is stable and she denies any LE edema -Continue Lasix PRN>> she has not used any recently  2.  Nonischemic DCM -essentially normal coronary arteries by cath 2016 -DCM resolved with EF 55% on echo 2018 -continue Entresto, carvedilol and PRN diuretic -creatinine was 0.68 and K= 4.7 on 12/16/2019  3.  HTN -BP controlled on exam -continue Entresto 97-103mg  BID and Carvedilol 12.5mg  BID  4.  OSA -  The PAP download was reviewed today and showed an AHI of 0.6/hr on 13 cm H2O with 100% compliance in using more than 4 hours nightly.  The patient has been using and benefiting from PAP use and will continue to benefit from therapy.  -she has a 13/02/2019 device which has been recalled and she is due to get a new device in December per insurance -I will go ahead and write a Rx for a new ResMed device for Dec -we will  have a televisit followup 8 weeks after she gets her device  5.  Morbid Obesity -I have encouraged her to cut back on carbs and portions. -she uses a cane to walk due to back problems  -she had been seeing [facility] but was not happy with the encounter  -encouraged her to get into water aerobics   Medication Adjustments/Labs and Tests Ordered: Current medicines are reviewed at length with the patient today.  Concerns regarding medicines are outlined above.  Tests Ordered: No orders of the defined types were placed in this encounter.  Medication Changes: No orders of the defined types were placed in this encounter.   Disposition:  Follow up in 1 year(s)  Signed, Jan, MD  12/25/2019 11:20 AM    [facility]

## 2019-12-25 NOTE — Telephone Encounter (Signed)
Order placed to [facility].

## 2019-12-25 NOTE — Patient Instructions (Signed)
Medication Instructions:  Your physician recommends that you continue on your current medications as directed. Please refer to the Current Medication list given to you today.  *If you need a refill on your cardiac medications before your next appointment, please call your pharmacy*  Follow-Up: At [facility], you and your health needs are our priority.  As part of our continuing mission to provide you with exceptional heart care, we have created designated Provider Care Teams.  These Care Teams include your primary Cardiologist (physician) and Advanced Practice Providers (APPs -  Physician Assistants and Nurse Practitioners) who all work together to provide you with the care you need, when you need it.  We recommend signing up for the patient portal called "MyChart".  Sign up information is provided on this After Visit Summary.  MyChart is used to connect with patients for Virtual Visits (Telemedicine).  Patients are able to view lab/test results, encounter notes, upcoming appointments, etc.  Non-urgent messages can be sent to your provider as well.   To learn more about what you can do with MyChart, go to [email or web address].    Your next appointment:   8 week(s) after receiving your new device  The format for your next appointment:   Virtual Visit   Provider:   Armanda Magic, MD

## 2020-01-07 ENCOUNTER — Other Ambulatory Visit: Payer: Self-pay | Admitting: Physician Assistant

## 2020-01-07 ENCOUNTER — Other Ambulatory Visit: Payer: Self-pay | Admitting: Cardiology

## 2020-01-07 DIAGNOSIS — E559 Vitamin D deficiency, unspecified: Secondary | ICD-10-CM

## 2020-01-07 MED ORDER — ENTRESTO 97-103 MG PO TABS
1.0000 | ORAL_TABLET | Freq: Two times a day (BID) | ORAL | 3 refills | Status: DC
Start: 2020-01-07 — End: 2020-04-20

## 2020-01-10 DIAGNOSIS — J449 Chronic obstructive pulmonary disease, unspecified: Secondary | ICD-10-CM | POA: Diagnosis not present

## 2020-01-10 DIAGNOSIS — J4520 Mild intermittent asthma, uncomplicated: Secondary | ICD-10-CM | POA: Diagnosis not present

## 2020-01-13 ENCOUNTER — Other Ambulatory Visit: Payer: Self-pay

## 2020-01-13 ENCOUNTER — Encounter: Payer: Self-pay | Admitting: Family Medicine

## 2020-01-13 ENCOUNTER — Ambulatory Visit (INDEPENDENT_AMBULATORY_CARE_PROVIDER_SITE_OTHER): Payer: Medicare HMO | Admitting: Family Medicine

## 2020-01-13 VITALS — BP 120/72 | HR 104 | Temp 97.3°F | Resp 20 | Ht 66.0 in | Wt 288.0 lb

## 2020-01-13 DIAGNOSIS — G894 Chronic pain syndrome: Secondary | ICD-10-CM

## 2020-01-13 DIAGNOSIS — F331 Major depressive disorder, recurrent, moderate: Secondary | ICD-10-CM

## 2020-01-13 DIAGNOSIS — Z23 Encounter for immunization: Secondary | ICD-10-CM

## 2020-01-13 DIAGNOSIS — E1165 Type 2 diabetes mellitus with hyperglycemia: Secondary | ICD-10-CM | POA: Diagnosis not present

## 2020-01-13 MED ORDER — BUPROPION HCL ER (XL) 300 MG PO TB24: 300.0000 mg | ORAL_TABLET | Freq: Every day | ORAL | 1 refills | Status: DC

## 2020-01-13 MED ORDER — TRULICITY 1.5 MG/0.5ML ~~LOC~~ SOAJ: 1.5000 mg | SUBCUTANEOUS | 0 refills | Status: DC

## 2020-01-13 MED ORDER — FARXIGA 10 MG PO TABS
10.0000 mg | ORAL_TABLET | Freq: Every day | ORAL | 3 refills | Status: DC
Start: 2020-01-13 — End: 2020-01-24

## 2020-01-13 NOTE — Progress Notes (Signed)
Subjective:  Patient ID: Judy [name], female    DOB: October 07, 1960  Age: 59 y.o. MRN: [id number]  Chief Complaint  Patient presents with  . Depression    HPI Depression: Started on wellbutrin xl 150 mg once in am x 1 week, and then increased to wellbutrin xl 300 mg once daily in am. Has not needed clonazepam. Pt has gone to counselor. It helped some. Pt is still dealing with emotional trauma related to her stepson's chronic drug use.   PHQ9 SCORE ONLY 01/13/2020 12/18/2019 07/17/2019  PHQ-9 Total Score 17 6 18    Diabetes: Sugars 150s. ON trulicity at 1.5 mg once weekly and farxiga 10 mg once daily. Has lost 5 lbs. Little nausea. Working on eating healthy.    Chronic back pain: Pt is taking tramadol usually once daily. Sometimes will take a second one. Pt is reluctant to take too much.   Current Outpatient Medications on File Prior to Visit  Medication Sig Dispense Refill  . atorvastatin (LIPITOR) 20 MG tablet Take 20 mg by mouth daily.    Marland Kitchen aspirin 81 MG tablet Take 81 mg by mouth at bedtime.     . carvedilol (COREG) 12.5 MG tablet TAKE 1 TABLET TWICE DAILY WITH A MEAL 180 tablet 3  . Cholecalciferol (VITAMIN D-3) 5000 units TABS Take 1 capsule by mouth daily.    . clonazePAM (KLONOPIN) 0.5 MG tablet Take 1 tablet (0.5 mg total) by mouth 2 (two) times daily as needed for anxiety (panic attacks). (Patient not taking: Reported on 12/25/2019) 60 tablet 0  . Coenzyme Q10 (CO Q 10) 100 MG CAPS Take 200 mg by mouth daily.    13/11/2019 gabapentin (NEURONTIN) 400 MG capsule Take 2 tablets by mouth in the AM and 2 by mouth in the PM  1  . icosapent Ethyl (VASCEPA) 1 g capsule Take 2 capsules (2 g total) by mouth 2 (two) times daily. 360 capsule 1  . levothyroxine (SYNTHROID) 75 MCG tablet TAKE 1 TABLET (75 MCG TOTAL) BY MOUTH DAILY. 90 tablet 1  . Lutein 20 MG TABS Take 1 tablet by mouth daily.    . Multiple Vitamin (MULTIVITAMIN WITH MINERALS) TABS tablet Take 1 tablet by mouth daily.    . OXYGEN  2lpm 2 lpm as needed    . sacubitril-valsartan (ENTRESTO) 97-103 MG Take 1 tablet by mouth 2 (two) times daily. 180 tablet 3  . STIOLTO RESPIMAT 2.5-2.5 MCG/ACT AERS INHALE 2 PUFFS INTO THE LUNGS DAILY. 12 g 5  . tiZANidine (ZANAFLEX) 4 MG tablet Take 4 mg by mouth daily.    . traMADol (ULTRAM) 50 MG tablet Take 1 tablet by mouth every 6 (six) hours as needed.  0  . UNABLE TO FIND CPAP with o2 2lpm  DME- [facility]    . Vitamin D, Ergocalciferol, (DRISDOL) 1.25 MG (50000 UNIT) CAPS capsule TAKE 1 CAPSULE EVERY 7 DAYS 12 capsule 1   No current facility-administered medications on file prior to visit.   Past Medical History:  Diagnosis Date  . Anxiety   . Back pain   . Bilateral swelling of feet   . Chronic combined systolic and diastolic CHF (congestive heart failure) (HCC) 10/07/2014  . COPD (chronic obstructive pulmonary disease) (HCC)   . DCM (dilated cardiomyopathy) (HCC) 04/25/2014   EF 28% by MRI despite maximum medical therapy  . Depression   . Diabetes mellitus without complication (HCC)   . Epilepsy (HCC)    as a child  . Fatty liver   .  Former tobacco use   . Hyperlipidemia   . Hypertension   . Hypothyroidism   . Joint pain   . Leg swelling   . Morbid obesity (HCC)   . NICM (nonischemic cardiomyopathy) (HCC)   . OSA (obstructive sleep apnea)    moderate with AHI 26/hr with oxygen desaturations as low as 70%  . Scoliosis   . Sleep apnea   . SOB (shortness of breath)    Past Surgical History:  Procedure Laterality Date  . ABDOMINAL HYSTERECTOMY    . ankle artery involved cyst removal    . CARDIAC CATHETERIZATION  05/14/2014   normal coronary arteries  . CARPAL TUNNEL RELEASE    . FASCIOTOMY     1993 for platar fasciitis  . harrington rod scoliosis     1981  . LEFT HEART CATHETERIZATION WITH CORONARY ANGIOGRAM N/A 05/14/2014   Procedure: LEFT HEART CATHETERIZATION WITH CORONARY ANGIOGRAM;  Surgeon: Iran Ouch, MD;  Location: [facility];  Service: Cardiovascular;   Laterality: N/A;  . ROTATOR CUFF REPAIR    . UMBILICAL HERNIA REPAIR    . VESICOVAGINAL FISTULA CLOSURE W/ TAH      Family History  Problem Relation Age of Onset  . Emphysema Father   . Allergies Father   . Heart failure Father   . Heart disease Father 15       MI  . Diabetes Father   . Hyperlipidemia Father   . Hypertension Father   . Thyroid disease Father   . Alcohol abuse Father   . Obesity Father   . Diabetes Mother   . Hyperlipidemia Mother   . Obesity Mother    Social History   Socioeconomic History  . Marital status: Married    Spouse name: Not on file  . Number of children: Not on file  . Years of education: Not on file  . Highest education level: Not on file  Occupational History  . Occupation: disabled  Tobacco Use  . Smoking status: Former Smoker    Packs/day: 1.00    Years: 15.00    Pack years: 15.00    Types: Cigarettes    Quit date: 02/14/2005    Years since quitting: 14.9  . Smokeless tobacco: Never Used  Vaping Use  . Vaping Use: Never used  Substance and Sexual Activity  . Alcohol use: Yes    Comment: occ  . Drug use: No  . Sexual activity: Not on file  Other Topics Concern  . Not on file  Social History Narrative   Lives in [place] with spouse and son.   Previously worked as a Comptroller         Social Determinants of Corporate investment banker Strain:   . Difficulty of Paying Living Expenses: Not on file  Food Insecurity:   . Worried About Programme researcher, broadcasting/film/video in the Last Year: Not on file  . Ran Out of Food in the Last Year: Not on file  Transportation Needs:   . Lack of Transportation (Medical): Not on file  . Lack of Transportation (Non-Medical): Not on file  Physical Activity:   . Days of Exercise per Week: Not on file  . Minutes of Exercise per Session: Not on file  Stress:   . Feeling of Stress : Not on file  Social Connections:   . Frequency of Communication with Friends and Family: Not on file  .  Frequency of Social Gatherings with Friends and Family: Not on  file  . Attends Religious Services: Not on file  . Active Member of Clubs or Organizations: Not on file  . Attends Banker Meetings: Not on file  . Marital Status: Not on file    Review of Systems  Constitutional: Negative for chills, fatigue and fever.  HENT: Positive for congestion. Negative for rhinorrhea and sore throat.   Eyes: Positive for visual disturbance (Making an eye appt).  Respiratory: Negative for cough and shortness of breath.   Cardiovascular: Negative for chest pain.  Gastrointestinal: Negative for abdominal pain, constipation, diarrhea, nausea and vomiting.  Genitourinary: Negative for dysuria and urgency.  Musculoskeletal: Positive for arthralgias, back pain and myalgias.  Neurological: Positive for weakness. Negative for dizziness, light-headedness and headaches.  Psychiatric/Behavioral: Negative for dysphoric mood. The patient is not nervous/anxious.      Objective:  BP 120/72   Pulse (!) 104   Temp (!) 97.3 F (36.3 C)   Resp 20   Ht 5\' 6"  (1.676 m)   Wt 288 lb (130.6 kg)   BMI 46.48 kg/m   BP/Weight 01/13/2020 12/25/2019 12/18/2019  Systolic BP 120 118 120  Diastolic BP 72 77 74  Wt. (Lbs) 288 292.8 291  BMI 46.48 47.26 46.97    Physical Exam Vitals reviewed.  Constitutional:      Appearance: She is obese.  Cardiovascular:     Rate and Rhythm: Normal rate and regular rhythm.     Heart sounds: Normal heart sounds.  Pulmonary:     Effort: Pulmonary effort is normal. No respiratory distress.     Breath sounds: Normal breath sounds.  Abdominal:     Tenderness: There is no abdominal tenderness.  Neurological:     Mental Status: She is alert and oriented to person, place, and time.  Psychiatric:        Mood and Affect: Mood normal.        Behavior: Behavior normal.     Comments: Depressed.      Diabetic Foot Exam - Simple   No data filed       Lab Results   Component Value Date   WBC 7.9 12/16/2019   HGB 14.9 12/16/2019   HCT 46.0 12/16/2019   PLT 224 12/16/2019   GLUCOSE 203 (H) 12/16/2019   CHOL 172 12/16/2019   TRIG 210 (H) 12/16/2019   HDL 40 12/16/2019   LDLCALC 96 12/16/2019   ALT 32 12/16/2019   AST 21 12/16/2019   NA 143 12/16/2019   K 4.7 12/16/2019   CL 106 12/16/2019   CREATININE 0.68 12/16/2019   BUN 19 12/16/2019   CO2 22 12/16/2019   TSH 2.020 07/17/2019   INR 1.0 05/13/2014   HGBA1C 7.7 (H) 12/16/2019   MICROALBUR Negative 09/03/2019      Assessment & Plan:   1. Type 2 diabetes mellitus with hyperglycemia, without long-term current use of insulin (HCC) Control: improving Recommend check sugars fasting daily. Recommend check feet daily. Recommend annual eye exams. Medicines: Continue trulicity 1.5 mg once weekly and continue farxiga 10 mg once daily. Continue to work on eating a healthy diet and exercise.   2. Moderate recurrent major depression (HCC) The current medical regimen is not yet effective;  Recommend we give it more time.   3. Encounter for immunization - Flu Vaccine MDCK QUAD PF  4. Chronic pain syndrome  Encouraged to take tramadol 50 mg one twice a day.   Meds ordered this encounter  Medications  . buPROPion (WELLBUTRIN XL) 300  MG 24 hr tablet    Sig: Take 1 tablet (300 mg total) by mouth daily.    Dispense:  90 tablet    Refill:  1  . FARXIGA 10 MG TABS tablet    Sig: Take 1 tablet (10 mg total) by mouth daily.    Dispense:  90 tablet    Refill:  3  . Dulaglutide (TRULICITY) 1.5 MG/0.5ML SOPN    Sig: Inject 1.5 mg into the skin once a week.    Dispense:  6 mL    Refill:  0    Orders Placed This Encounter  Procedures  . Flu Vaccine MDCK QUAD PF    I spent 20 minutes dedicated to the care of this patient on the date of this encounter to include face-to-face time with the patient, as well as: reviewed meds, labs.   Follow-up: Return in about 2 months (around 03/19/2020) for  fasting.  An After Visit Summary was printed and given to the patient.  Blane Ohara, MD [facility] [phone]

## 2020-01-13 NOTE — Patient Instructions (Signed)
Continue wellbutrin xl 300 mg one in am.  Continue farxiga 10 mg once daily, trulicity 1.5 mg once weekly.

## 2020-01-17 ENCOUNTER — Ambulatory Visit: Payer: Medicare HMO | Admitting: Family Medicine

## 2020-01-22 DIAGNOSIS — G4733 Obstructive sleep apnea (adult) (pediatric): Secondary | ICD-10-CM | POA: Diagnosis not present

## 2020-01-24 ENCOUNTER — Other Ambulatory Visit: Payer: Self-pay

## 2020-01-24 DIAGNOSIS — J449 Chronic obstructive pulmonary disease, unspecified: Secondary | ICD-10-CM

## 2020-01-24 MED ORDER — FARXIGA 10 MG PO TABS
10.0000 mg | ORAL_TABLET | Freq: Every day | ORAL | 3 refills | Status: DC
Start: 2020-01-24 — End: 2020-01-29

## 2020-01-29 ENCOUNTER — Other Ambulatory Visit: Payer: Self-pay

## 2020-01-29 MED ORDER — FARXIGA 10 MG PO TABS: 10.0000 mg | ORAL_TABLET | Freq: Every day | ORAL | 3 refills | Status: DC

## 2020-02-05 ENCOUNTER — Encounter: Payer: Self-pay | Admitting: Family Medicine

## 2020-02-09 DIAGNOSIS — J4520 Mild intermittent asthma, uncomplicated: Secondary | ICD-10-CM | POA: Diagnosis not present

## 2020-02-09 DIAGNOSIS — J449 Chronic obstructive pulmonary disease, unspecified: Secondary | ICD-10-CM | POA: Diagnosis not present

## 2020-02-14 ENCOUNTER — Other Ambulatory Visit: Payer: Self-pay | Admitting: Family Medicine

## 2020-02-14 ENCOUNTER — Ambulatory Visit (INDEPENDENT_AMBULATORY_CARE_PROVIDER_SITE_OTHER): Payer: Medicare HMO | Admitting: Family Medicine

## 2020-02-14 VITALS — BP 95/60 | HR 87 | Temp 96.3°F | Wt 278.0 lb

## 2020-02-14 DIAGNOSIS — I5042 Chronic combined systolic (congestive) and diastolic (congestive) heart failure: Secondary | ICD-10-CM

## 2020-02-14 DIAGNOSIS — I9589 Other hypotension: Secondary | ICD-10-CM | POA: Diagnosis not present

## 2020-02-14 DIAGNOSIS — I959 Hypotension, unspecified: Secondary | ICD-10-CM | POA: Diagnosis not present

## 2020-02-24 ENCOUNTER — Other Ambulatory Visit: Payer: Self-pay

## 2020-02-24 DIAGNOSIS — H25813 Combined forms of age-related cataract, bilateral: Secondary | ICD-10-CM | POA: Diagnosis not present

## 2020-02-24 DIAGNOSIS — E119 Type 2 diabetes mellitus without complications: Secondary | ICD-10-CM | POA: Diagnosis not present

## 2020-02-24 LAB — HM DIABETES EYE EXAM

## 2020-02-24 MED ORDER — TIZANIDINE HCL 4 MG PO TABS: 4.0000 mg | ORAL_TABLET | Freq: Four times a day (QID) | ORAL | 1 refills | Status: DC | PRN

## 2020-02-25 NOTE — Progress Notes (Incomplete)
Acute Office Visit  Subjective:    Patient ID: Judy [name], female    DOB: Aug 07, 1960, 60 y.o.   MRN: [id number]  Chief Complaint  Patient presents with  . Hypotension    HPI Patient is in today for ***  Past Medical History:  Diagnosis Date  . Anxiety   . Back pain   . Bilateral swelling of feet   . Chronic combined systolic and diastolic CHF (congestive heart failure) (HCC) 10/07/2014  . COPD (chronic obstructive pulmonary disease) (HCC)   . DCM (dilated cardiomyopathy) (HCC) 04/25/2014   EF 28% by MRI despite maximum medical therapy  . Depression   . Diabetes mellitus without complication (HCC)   . Epilepsy (HCC)    as a child  . Fatty liver   . Former tobacco use   . Hyperlipidemia   . Hypertension   . Hypothyroidism   . Joint pain   . Leg swelling   . Morbid obesity (HCC)   . NICM (nonischemic cardiomyopathy) (HCC)   . OSA (obstructive sleep apnea)    moderate with AHI 26/hr with oxygen desaturations as low as 70%  . Scoliosis   . Sleep apnea   . SOB (shortness of breath)     Past Surgical History:  Procedure Laterality Date  . ABDOMINAL HYSTERECTOMY    . ankle artery involved cyst removal    . CARDIAC CATHETERIZATION  05/14/2014   normal coronary arteries  . CARPAL TUNNEL RELEASE    . FASCIOTOMY     1993 for platar fasciitis  . harrington rod scoliosis     1981  . LEFT HEART CATHETERIZATION WITH CORONARY ANGIOGRAM N/A 05/14/2014   Procedure: LEFT HEART CATHETERIZATION WITH CORONARY ANGIOGRAM;  Surgeon: Iran Ouch, MD;  Location: [facility];  Service: Cardiovascular;  Laterality: N/A;  . ROTATOR CUFF REPAIR    . UMBILICAL HERNIA REPAIR    . VESICOVAGINAL FISTULA CLOSURE W/ TAH      Family History  Problem Relation Age of Onset  . Emphysema Father   . Allergies Father   . Heart failure Father   . Heart disease Father 33       MI  . Diabetes Father   . Hyperlipidemia Father   . Hypertension Father   . Thyroid disease Father   .  Alcohol abuse Father   . Obesity Father   . Diabetes Mother   . Hyperlipidemia Mother   . Obesity Mother     Social History   Socioeconomic History  . Marital status: Married    Spouse name: Not on file  . Number of children: Not on file  . Years of education: Not on file  . Highest education level: Not on file  Occupational History  . Occupation: disabled  Tobacco Use  . Smoking status: Former Smoker    Packs/day: 1.00    Years: 15.00    Pack years: 15.00    Types: Cigarettes    Quit date: 02/14/2005    Years since quitting: 15.0  . Smokeless tobacco: Never Used  Vaping Use  . Vaping Use: Never used  Substance and Sexual Activity  . Alcohol use: Yes    Comment: occ  . Drug use: No  . Sexual activity: Not on file  Other Topics Concern  . Not on file  Social History Narrative   Lives in [place] with spouse and son.   Previously worked as a Comptroller  Social Determinants of Health   Financial Resource Strain: Not on file  Food Insecurity: Not on file  Transportation Needs: Not on file  Physical Activity: Not on file  Stress: Not on file  Social Connections: Not on file  Intimate Partner Violence: Not on file    Outpatient Medications Prior to Visit  Medication Sig Dispense Refill  . aspirin 81 MG tablet Take 81 mg by mouth at bedtime.     Marland Kitchen atorvastatin (LIPITOR) 20 MG tablet Take 20 mg by mouth daily.    Marland Kitchen buPROPion (WELLBUTRIN XL) 300 MG 24 hr tablet Take 1 tablet (300 mg total) by mouth daily. 90 tablet 1  . carvedilol (COREG) 12.5 MG tablet TAKE 1 TABLET TWICE DAILY WITH A MEAL 180 tablet 3  . Cholecalciferol (VITAMIN D-3) 5000 units TABS Take 1 capsule by mouth daily.    . clonazePAM (KLONOPIN) 0.5 MG tablet Take 1 tablet (0.5 mg total) by mouth 2 (two) times daily as needed for anxiety (panic attacks). (Patient not taking: Reported on 12/25/2019) 60 tablet 0  . Coenzyme Q10 (CO Q 10) 100 MG CAPS Take 200 mg by mouth daily.     . Dulaglutide (TRULICITY) 1.5 MG/0.5ML SOPN Inject 1.5 mg into the skin once a week. 6 mL 0  . FARXIGA 10 MG TABS tablet Take 1 tablet (10 mg total) by mouth daily. 90 tablet 3  . gabapentin (NEURONTIN) 400 MG capsule Take 2 tablets by mouth in the AM and 2 by mouth in the PM  1  . icosapent Ethyl (VASCEPA) 1 g capsule Take 2 capsules (2 g total) by mouth 2 (two) times daily. 360 capsule 1  . levothyroxine (SYNTHROID) 75 MCG tablet TAKE 1 TABLET (75 MCG TOTAL) BY MOUTH DAILY. 90 tablet 1  . Lutein 20 MG TABS Take 1 tablet by mouth daily.    . Multiple Vitamin (MULTIVITAMIN WITH MINERALS) TABS tablet Take 1 tablet by mouth daily.    . OXYGEN 2lpm 2 lpm as needed    . sacubitril-valsartan (ENTRESTO) 97-103 MG Take 1 tablet by mouth 2 (two) times daily. 180 tablet 3  . STIOLTO RESPIMAT 2.5-2.5 MCG/ACT AERS INHALE 2 PUFFS INTO THE LUNGS DAILY. 12 g 5  . traMADol (ULTRAM) 50 MG tablet Take 1 tablet by mouth every 6 (six) hours as needed.  0  . UNABLE TO FIND CPAP with o2 2lpm  DME- [facility]    . Vitamin D, Ergocalciferol, (DRISDOL) 1.25 MG (50000 UNIT) CAPS capsule TAKE 1 CAPSULE EVERY 7 DAYS 12 capsule 1   No facility-administered medications prior to visit.    Allergies  Allergen Reactions  . Demerol [Meperidine] Nausea And Vomiting    Review of Systems     Objective:    Physical Exam  BP 95/60   Pulse 87   Temp (!) 96.3 F (35.7 C)   Wt 278 lb (126.1 kg)   SpO2 94%   BMI 44.87 kg/m  Wt Readings from Last 3 Encounters:  02/25/20 278 lb (126.1 kg)  01/13/20 288 lb (130.6 kg)  12/25/19 292 lb 12.8 oz (132.8 kg)    Health Maintenance Due  Topic Date Due  . OPHTHALMOLOGY EXAM  02/18/2020    There are no preventive care reminders to display for this patient.   Lab Results  Component Value Date   TSH 2.020 07/17/2019   Lab Results  Component Value Date   WBC 7.9 12/16/2019   HGB 14.9 12/16/2019   HCT 46.0 12/16/2019  MCV 92 12/16/2019   PLT 224 12/16/2019   Lab  Results  Component Value Date   NA 143 12/16/2019   K 4.7 12/16/2019   CO2 22 12/16/2019   GLUCOSE 203 (H) 12/16/2019   BUN 19 12/16/2019   CREATININE 0.68 12/16/2019   BILITOT 0.5 12/16/2019   ALKPHOS 183 (H) 12/16/2019   AST 21 12/16/2019   ALT 32 12/16/2019   PROT 6.5 12/16/2019   ALBUMIN 4.2 12/16/2019   CALCIUM 9.7 12/16/2019   ANIONGAP 13 09/24/2018   GFR 100.87 10/01/2014   Lab Results  Component Value Date   CHOL 172 12/16/2019   Lab Results  Component Value Date   HDL 40 12/16/2019   Lab Results  Component Value Date   LDLCALC 96 12/16/2019   Lab Results  Component Value Date   TRIG 210 (H) 12/16/2019   Lab Results  Component Value Date   CHOLHDL 4.3 12/16/2019   Lab Results  Component Value Date   HGBA1C 7.7 (H) 12/16/2019       Assessment & Plan:  There are no diagnoses linked to this encounter.   No orders of the defined types were placed in this encounter.   No orders of the defined types were placed in this encounter.    I spent < time > minutes dedicated to the care of this patient on the date of this encounter to include face-to-face time with the patient, as well as: ***  Follow-up: No follow-ups on file.  An After Visit Summary was printed and given to the patient.  Blane Ohara, MD [facility] [phone]

## 2020-02-25 NOTE — Progress Notes (Signed)
Acute Office Visit  Subjective:    Patient ID: Judy [name], female    DOB: [birth year], 60 y.o.   MRN: [id number]  Chief Complaint  Patient presents with  . Hypotension    HPI Patient is in today for dizziness and hypotension. Bp this morning was 90s/60s. Denies chest pain, sob, edema. Denies fever, chills, sweats.   Past Medical History:  Diagnosis Date  . Anxiety   . Back pain   . Bilateral swelling of feet   . Chronic combined systolic and diastolic CHF (congestive heart failure) (HCC) 10/07/2014  . COPD (chronic obstructive pulmonary disease) (HCC)   . DCM (dilated cardiomyopathy) (HCC) 04/25/2014   EF 28% by MRI despite maximum medical therapy  . Depression   . Diabetes mellitus without complication (HCC)   . Epilepsy (HCC)    as a child  . Fatty liver   . Former tobacco use   . Hyperlipidemia   . Hypertension   . Hypothyroidism   . Joint pain   . Leg swelling   . Morbid obesity (HCC)   . NICM (nonischemic cardiomyopathy) (HCC)   . OSA (obstructive sleep apnea)    moderate with AHI 26/hr with oxygen desaturations as low as 70%  . Scoliosis   . Sleep apnea   . SOB (shortness of breath)     Past Surgical History:  Procedure Laterality Date  . ABDOMINAL HYSTERECTOMY    . ankle artery involved cyst removal    . CARDIAC CATHETERIZATION  05/14/2014   normal coronary arteries  . CARPAL TUNNEL RELEASE    . FASCIOTOMY     1993 for platar fasciitis  . harrington rod scoliosis     1981  . LEFT HEART CATHETERIZATION WITH CORONARY ANGIOGRAM N/A 05/14/2014   Procedure: LEFT HEART CATHETERIZATION WITH CORONARY ANGIOGRAM;  Surgeon: Iran Ouch, MD;  Location: [facility];  Service: Cardiovascular;  Laterality: N/A;  . ROTATOR CUFF REPAIR    . UMBILICAL HERNIA REPAIR    . VESICOVAGINAL FISTULA CLOSURE W/ TAH      Family History  Problem Relation Age of Onset  . Emphysema Father   . Allergies Father   . Heart failure Father   . Heart disease Father 38        MI  . Diabetes Father   . Hyperlipidemia Father   . Hypertension Father   . Thyroid disease Father   . Alcohol abuse Father   . Obesity Father   . Diabetes Mother   . Hyperlipidemia Mother   . Obesity Mother     Social History   Socioeconomic History  . Marital status: Married    Spouse name: Not on file  . Number of children: Not on file  . Years of education: Not on file  . Highest education level: Not on file  Occupational History  . Occupation: disabled  Tobacco Use  . Smoking status: Former Smoker    Packs/day: 1.00    Years: 15.00    Pack years: 15.00    Types: Cigarettes    Quit date: 02/14/2005    Years since quitting: 15.0  . Smokeless tobacco: Never Used  Vaping Use  . Vaping Use: Never used  Substance and Sexual Activity  . Alcohol use: Yes    Comment: occ  . Drug use: No  . Sexual activity: Not on file  Other Topics Concern  . Not on file  Social History Narrative   Lives in [place] with spouse and son.  Previously worked as a Comptroller         Social Determinants of Community education officer: Not on [employer] Insecurity: Not on file  Transportation Needs: Not on file  Physical Activity: Not on file  Stress: Not on file  Social Connections: Not on file  Intimate Partner Violence: Not on file    Outpatient Medications Prior to Visit  Medication Sig Dispense Refill  . aspirin 81 MG tablet Take 81 mg by mouth at bedtime.     Marland Kitchen atorvastatin (LIPITOR) 20 MG tablet Take 20 mg by mouth daily.    Marland Kitchen buPROPion (WELLBUTRIN XL) 300 MG 24 hr tablet Take 1 tablet (300 mg total) by mouth daily. 90 tablet 1  . carvedilol (COREG) 12.5 MG tablet TAKE 1 TABLET TWICE DAILY WITH A MEAL 180 tablet 3  . Cholecalciferol (VITAMIN D-3) 5000 units TABS Take 1 capsule by mouth daily.    . clonazePAM (KLONOPIN) 0.5 MG tablet Take 1 tablet (0.5 mg total) by mouth 2 (two) times daily as needed for anxiety (panic attacks). (Patient not  taking: Reported on 12/25/2019) 60 tablet 0  . Coenzyme Q10 (CO Q 10) 100 MG CAPS Take 200 mg by mouth daily.    . Dulaglutide (TRULICITY) 1.5 MG/0.5ML SOPN Inject 1.5 mg into the skin once a week. 6 mL 0  . FARXIGA 10 MG TABS tablet Take 1 tablet (10 mg total) by mouth daily. 90 tablet 3  . gabapentin (NEURONTIN) 400 MG capsule Take 2 tablets by mouth in the AM and 2 by mouth in the PM  1  . icosapent Ethyl (VASCEPA) 1 g capsule Take 2 capsules (2 g total) by mouth 2 (two) times daily. 360 capsule 1  . levothyroxine (SYNTHROID) 75 MCG tablet TAKE 1 TABLET (75 MCG TOTAL) BY MOUTH DAILY. 90 tablet 1  . Lutein 20 MG TABS Take 1 tablet by mouth daily.    . Multiple Vitamin (MULTIVITAMIN WITH MINERALS) TABS tablet Take 1 tablet by mouth daily.    . OXYGEN 2lpm 2 lpm as needed    . sacubitril-valsartan (ENTRESTO) 97-103 MG Take 1 tablet by mouth 2 (two) times daily. 180 tablet 3  . STIOLTO RESPIMAT 2.5-2.5 MCG/ACT AERS INHALE 2 PUFFS INTO THE LUNGS DAILY. 12 g 5  . traMADol (ULTRAM) 50 MG tablet Take 1 tablet by mouth every 6 (six) hours as needed.  0  . UNABLE TO FIND CPAP with o2 2lpm  DME- [facility]    . Vitamin D, Ergocalciferol, (DRISDOL) 1.25 MG (50000 UNIT) CAPS capsule TAKE 1 CAPSULE EVERY 7 DAYS 12 capsule 1   No facility-administered medications prior to visit.    Allergies  Allergen Reactions  . Demerol [Meperidine] Nausea And Vomiting    Review of Systems  Constitutional: Negative for chills, fatigue and fever.  HENT: Negative for congestion, ear pain and sore throat.   Respiratory: Negative for cough and shortness of breath.   Cardiovascular: Negative for chest pain.  Gastrointestinal: Negative for abdominal pain, constipation, diarrhea, nausea and vomiting.  Genitourinary: Negative for dysuria and urgency.  Musculoskeletal: Negative for arthralgias and myalgias.  Skin: Negative for rash.  Neurological: Positive for dizziness. Negative for headaches.  Psychiatric/Behavioral:  Negative for dysphoric mood. The patient is not nervous/anxious.        Objective:    Physical Exam Vitals reviewed.  Constitutional:      Appearance: Normal appearance. She is normal weight.  Neck:     Vascular:  No carotid bruit.  Cardiovascular:     Rate and Rhythm: Normal rate and regular rhythm.     Pulses: Normal pulses.     Heart sounds: Normal heart sounds.  Pulmonary:     Effort: Pulmonary effort is normal. No respiratory distress.     Breath sounds: Normal breath sounds.  Abdominal:     General: Abdomen is flat. Bowel sounds are normal.     Palpations: Abdomen is soft.     Tenderness: There is no abdominal tenderness.  Neurological:     Mental Status: She is alert and oriented to person, place, and time.  Psychiatric:        Mood and Affect: Mood normal.        Behavior: Behavior normal.     BP 95/60   Pulse 87   Temp (!) 96.3 F (35.7 C)   Wt 278 lb (126.1 kg)   SpO2 94%   BMI 44.87 kg/m  Wt Readings from Last 3 Encounters:  02/25/20 278 lb (126.1 kg)  01/13/20 288 lb (130.6 kg)  12/25/19 292 lb 12.8 oz (132.8 kg)    Health Maintenance Due  Topic Date Due  . OPHTHALMOLOGY EXAM  02/18/2020    There are no preventive care reminders to display for this patient.   Lab Results  Component Value Date   TSH 2.020 07/17/2019   Lab Results  Component Value Date   WBC 7.9 12/16/2019   HGB 14.9 12/16/2019   HCT 46.0 12/16/2019   MCV 92 12/16/2019   PLT 224 12/16/2019   Lab Results  Component Value Date   NA 143 12/16/2019   K 4.7 12/16/2019   CO2 22 12/16/2019   GLUCOSE 203 (H) 12/16/2019   BUN 19 12/16/2019   CREATININE 0.68 12/16/2019   BILITOT 0.5 12/16/2019   ALKPHOS 183 (H) 12/16/2019   AST 21 12/16/2019   ALT 32 12/16/2019   PROT 6.5 12/16/2019   ALBUMIN 4.2 12/16/2019   CALCIUM 9.7 12/16/2019   ANIONGAP 13 09/24/2018   GFR 100.87 10/01/2014   Lab Results  Component Value Date   CHOL 172 12/16/2019   Lab Results  Component  Value Date   HDL 40 12/16/2019   Lab Results  Component Value Date   LDLCALC 96 12/16/2019   Lab Results  Component Value Date   TRIG 210 (H) 12/16/2019   Lab Results  Component Value Date   CHOLHDL 4.3 12/16/2019   Lab Results  Component Value Date   HGBA1C 7.7 (H) 12/16/2019       Assessment & Plan:  1. Other specified hypotension Decrease entresto as below. Push fluids.  Check BP twice daily EKG unchanged from previous EKG. Inverted Ts in V2.  2. Chronic combined systolic and diastolic congestive heart failure (HCC)  Decrease entresto to 24/26 twice daily if sbp is > 100. Given samples for the weekend and instructed to call Dr. Mayford Knife, Cardiology, and follow up next week.   Follow-up: Return if symptoms worsen or fail to improve.  An After Visit Summary was printed and given to the patient.  Blane Ohara, MD [facility] [phone]

## 2020-02-26 ENCOUNTER — Encounter: Payer: Self-pay | Admitting: Family Medicine

## 2020-03-03 DIAGNOSIS — G4733 Obstructive sleep apnea (adult) (pediatric): Secondary | ICD-10-CM | POA: Diagnosis not present

## 2020-03-11 DIAGNOSIS — J4520 Mild intermittent asthma, uncomplicated: Secondary | ICD-10-CM | POA: Diagnosis not present

## 2020-03-11 DIAGNOSIS — J449 Chronic obstructive pulmonary disease, unspecified: Secondary | ICD-10-CM | POA: Diagnosis not present

## 2020-03-12 ENCOUNTER — Other Ambulatory Visit: Payer: Medicare HMO

## 2020-03-18 ENCOUNTER — Other Ambulatory Visit: Payer: Self-pay | Admitting: Internal Medicine

## 2020-03-18 ENCOUNTER — Ambulatory Visit: Payer: Medicare HMO | Admitting: Family Medicine

## 2020-03-18 DIAGNOSIS — J449 Chronic obstructive pulmonary disease, unspecified: Secondary | ICD-10-CM

## 2020-03-18 MED ORDER — STIOLTO RESPIMAT 2.5-2.5 MCG/ACT IN AERS: INHALATION_SPRAY | RESPIRATORY_TRACT | 5 refills | Status: DC

## 2020-03-23 ENCOUNTER — Encounter: Payer: Self-pay | Admitting: Family Medicine

## 2020-03-23 NOTE — Telephone Encounter (Signed)
Hello Judy [name],  Can you help Judy [name] please? I have forwarded this to you. She should already be a ccm patient, but would certainly qualify.  Thanks, Dr. Sedalia Muta

## 2020-03-24 ENCOUNTER — Encounter: Payer: Self-pay | Admitting: Family Medicine

## 2020-03-24 ENCOUNTER — Other Ambulatory Visit: Payer: Self-pay

## 2020-03-24 ENCOUNTER — Other Ambulatory Visit: Payer: Self-pay | Admitting: Family Medicine

## 2020-03-24 DIAGNOSIS — Z79899 Other long term (current) drug therapy: Secondary | ICD-10-CM

## 2020-03-24 NOTE — Telephone Encounter (Signed)
Requested CCM referral. Will reach out to [facility] about Judy [name] Patient Assistance.

## 2020-03-25 ENCOUNTER — Other Ambulatory Visit: Payer: Self-pay | Admitting: Family Medicine

## 2020-03-25 DIAGNOSIS — E7849 Other hyperlipidemia: Secondary | ICD-10-CM

## 2020-03-27 ENCOUNTER — Telehealth: Payer: Self-pay | Admitting: Family Medicine

## 2020-03-27 NOTE — Progress Notes (Signed)
°  Chronic Care Management   Outreach Note  03/27/2020 Name: Judy [name] MRN: [id number] DOB: 1961/01/22  Referred by: Blane Ohara, MD Reason for referral : Chronic Care Management   An unsuccessful telephone outreach was attempted today. The patient was referred to the pharmacist for assistance with care management and care coordination.   Follow Up Plan:   Aggie Hacker  Upstream Scheduler

## 2020-03-30 ENCOUNTER — Telehealth: Payer: Self-pay | Admitting: Family Medicine

## 2020-03-30 NOTE — Chronic Care Management (AMB) (Signed)
  Chronic Care Management   Note  03/30/2020 Name: Judy [name] MRN: [id number] DOB: 02-Dec-1960  Judy [name] is a 60 y.o. year old female who is a primary care patient of Cox, Kirsten, MD. I reached out to [name] by phone today in response to a referral sent by [name]'s PCP, Cox, Kirsten, MD.   [name] was given information about Chronic Care Management services today including:  1. CCM service includes personalized support from designated clinical staff supervised by her physician, including individualized plan of care and coordination with other care providers 2. 24/7 contact phone numbers for assistance for urgent and routine care needs. 3. Service will only be billed when office clinical staff spend 20 minutes or more in a month to coordinate care. 4. Only one practitioner may furnish and bill the service in a calendar month. 5. The patient may stop CCM services at any time (effective at the end of the month) by phone call to the office staff.   Patient agreed to services and verbal consent obtained.   Follow up plan:   Aggie Hacker  Upstream Scheduler

## 2020-04-02 DIAGNOSIS — G4733 Obstructive sleep apnea (adult) (pediatric): Secondary | ICD-10-CM | POA: Diagnosis not present

## 2020-04-03 DIAGNOSIS — G4733 Obstructive sleep apnea (adult) (pediatric): Secondary | ICD-10-CM | POA: Diagnosis not present

## 2020-04-06 ENCOUNTER — Other Ambulatory Visit: Payer: Medicare HMO

## 2020-04-06 DIAGNOSIS — I10 Essential (primary) hypertension: Secondary | ICD-10-CM | POA: Diagnosis not present

## 2020-04-06 DIAGNOSIS — E038 Other specified hypothyroidism: Secondary | ICD-10-CM | POA: Diagnosis not present

## 2020-04-06 DIAGNOSIS — E785 Hyperlipidemia, unspecified: Secondary | ICD-10-CM

## 2020-04-06 DIAGNOSIS — E1169 Type 2 diabetes mellitus with other specified complication: Secondary | ICD-10-CM | POA: Diagnosis not present

## 2020-04-07 ENCOUNTER — Encounter: Payer: Self-pay | Admitting: Family Medicine

## 2020-04-07 ENCOUNTER — Other Ambulatory Visit: Payer: Medicare HMO

## 2020-04-07 LAB — COMPREHENSIVE METABOLIC PANEL
ALT: 29 IU/L (ref 0–32)
AST: 20 IU/L (ref 0–40)
Albumin/Globulin Ratio: 1.8 (ref 1.2–2.2)
Albumin: 4 g/dL (ref 3.8–4.9)
Alkaline Phosphatase: 192 IU/L — ABNORMAL HIGH (ref 44–121)
BUN/Creatinine Ratio: 19 (ref 9–23)
BUN: 16 mg/dL (ref 6–24)
Bilirubin Total: 0.6 mg/dL (ref 0.0–1.2)
CO2: 21 mmol/L (ref 20–29)
Calcium: 9.5 mg/dL (ref 8.7–10.2)
Chloride: 102 mmol/L (ref 96–106)
Creatinine, Ser: 0.85 mg/dL (ref 0.57–1.00)
GFR calc Af Amer: 87 mL/min/{1.73_m2} (ref >59)
GFR calc non Af Amer: 75 mL/min/{1.73_m2} (ref >59)
Globulin, Total: 2.2 g/dL (ref 1.5–4.5)
Glucose: 148 mg/dL — ABNORMAL HIGH (ref 65–99)
Potassium: 4.7 mmol/L (ref 3.5–5.2)
Sodium: 140 mmol/L (ref 134–144)
Total Protein: 6.2 g/dL (ref 6.0–8.5)

## 2020-04-07 LAB — LIPID PANEL
Chol/HDL Ratio: 3.7 ratio (ref 0.0–4.4)
Cholesterol, Total: 154 mg/dL (ref 100–199)
HDL: 42 mg/dL (ref >39)
LDL Chol Calc (NIH): 83 mg/dL (ref 0–99)
Triglycerides: 170 mg/dL — ABNORMAL HIGH (ref 0–149)
VLDL Cholesterol Cal: 29 mg/dL (ref 5–40)

## 2020-04-07 LAB — CBC WITH DIFF/PLATELET
Basophils Absolute: 0.1 10*3/uL (ref 0.0–0.2)
Basos: 1 %
EOS (ABSOLUTE): 0.3 10*3/uL (ref 0.0–0.4)
Eos: 3 %
Hematocrit: 45.1 % (ref 34.0–46.6)
Hemoglobin: 14.8 g/dL (ref 11.1–15.9)
Immature Grans (Abs): 0 10*3/uL (ref 0.0–0.1)
Immature Granulocytes: 0 %
Lymphocytes Absolute: 1.7 10*3/uL (ref 0.7–3.1)
Lymphs: 22 %
MCH: 30.3 pg (ref 26.6–33.0)
MCHC: 32.8 g/dL (ref 31.5–35.7)
MCV: 92 fL (ref 79–97)
Monocytes Absolute: 0.6 10*3/uL (ref 0.1–0.9)
Monocytes: 8 %
Neutrophils Absolute: 5.2 10*3/uL (ref 1.4–7.0)
Neutrophils: 66 %
Platelets: 222 10*3/uL (ref 150–450)
RBC: 4.89 x10E6/uL (ref 3.77–5.28)
RDW: 13.1 % (ref 11.7–15.4)
WBC: 7.9 10*3/uL (ref 3.4–10.8)

## 2020-04-07 LAB — TSH: TSH: 3.07 u[IU]/mL (ref 0.450–4.500)

## 2020-04-07 LAB — HEMOGLOBIN A1C
Est. average glucose Bld gHb Est-mCnc: 163 mg/dL
Hgb A1c MFr Bld: 7.3 % — ABNORMAL HIGH (ref 4.8–5.6)

## 2020-04-07 LAB — CARDIOVASCULAR RISK ASSESSMENT

## 2020-04-07 NOTE — Progress Notes (Signed)
Subjective:  Patient ID: Judy [name], female    DOB: [birth year]  Age: 60 y.o. MRN: [id number]  Chief Complaint  Patient presents with  . Hyperlipidemia  . Hypertension    HPI  Chronic combined systolic and diastolic congestive heart failure  Patient takes entresto 97-103 mg twice a day.  Moderate recurrent major depression ON wellbutrin xl 300 mg once in am. Not taking clonazepam.   Hyperlipidemia associated with type 2 diabetes mellitus  Patient is taking trulicity 1.5 mg every week, farxiga 10 mg daily. Checks FBS 2 times a week and checks her feet daily. Not eating healthy. Unable to exercise due to sob.  Other specified hypothyroidism Patient takes levothyroxine 75 mg daily.  Vitamin D deficiency Patient is taking vitamin D 5000 units daily OTC and vitamin D 25189 units every week.  Hypotension BP DROPS & HR INCREASES. Patient holds her entresto and carvedilol when her bp drops.. 1/22  - 91/68, hr 89 3:30p 104/79, hr 70 6:30p 98/69, hr 76.   7:30 105/61, hr 79.  9:30 103/72, hr 81 76/53, hr 78.   11:30p (last night.)  OSA (obstructive sleep apnea) uses cpap every night. Clearly benefits.   Other hyperlipidemia Patient takes vascepa 2 gram twice a day, atorvastatin 20 mg daily.  Chronic Back Pain. Pt has issues with chronic backpain related to scoliosis.On tizanidine 4 mg one at night.  ON tramadol 50 mg takes 2-3 per day if back is bad.   COPD: on stiolto respimet. ON oxygen at night 2L. Requesting albuterol mdi.   Current Outpatient Medications on File Prior to Visit  Medication Sig Dispense Refill  . aspirin 81 MG tablet Take 81 mg by mouth at bedtime.     Marland Kitchen atorvastatin (LIPITOR) 20 MG tablet Take 20 mg by mouth daily.    Marland Kitchen buPROPion (WELLBUTRIN XL) 300 MG 24 hr tablet Take 1 tablet (300 mg total) by mouth daily. 90 tablet 1  . carvedilol (COREG) 12.5 MG tablet TAKE 1 TABLET TWICE DAILY WITH A MEAL 180 tablet 3  . Coenzyme Q10 (CO Q 10) 100 MG CAPS Take  200 mg by mouth daily.    Marland Kitchen FARXIGA 10 MG TABS tablet Take 1 tablet (10 mg total) by mouth daily. 90 tablet 3  . gabapentin (NEURONTIN) 400 MG capsule Take 2 tablets by mouth in the AM and 2 by mouth in the PM  1  . levothyroxine (SYNTHROID) 75 MCG tablet TAKE 1 TABLET (75 MCG TOTAL) BY MOUTH DAILY. 90 tablet 1  . Lutein 20 MG TABS Take 1 tablet by mouth daily.    . Multiple Vitamin (MULTIVITAMIN WITH MINERALS) TABS tablet Take 1 tablet by mouth daily.    . OXYGEN 2lpm 2 lpm as needed    . sacubitril-valsartan (ENTRESTO) 97-103 MG Take 1 tablet by mouth 2 (two) times daily. 180 tablet 3  . Tiotropium Bromide-Olodaterol (STIOLTO RESPIMAT) 2.5-2.5 MCG/ACT AERS INHALE 2 PUFFS INTO THE LUNGS DAILY. 12 g 5  . tiZANidine (ZANAFLEX) 4 MG tablet Take 1 tablet (4 mg total) by mouth every 6 (six) hours as needed for muscle spasms. 360 tablet 1  . UNABLE TO FIND CPAP with o2 2lpm  DME- [facility]    . VASCEPA 1 g capsule TAKE 2 CAPSULES TWICE DAILY 360 capsule 1   No current facility-administered medications on file prior to visit.   Past Medical History:  Diagnosis Date  . Anxiety   . Back pain   . Bilateral swelling of feet   .  Chronic combined systolic and diastolic CHF (congestive heart failure) (HCC) 10/07/2014  . COPD (chronic obstructive pulmonary disease) (HCC)   . DCM (dilated cardiomyopathy) (HCC) 04/25/2014   EF 28% by MRI despite maximum medical therapy  . Depression   . Diabetes mellitus without complication (HCC)   . Epilepsy (HCC)    as a child  . Fatty liver   . Former tobacco use   . Hyperlipidemia   . Hypertension   . Hypothyroidism   . Joint pain   . Leg swelling   . Morbid obesity (HCC)   . NICM (nonischemic cardiomyopathy) (HCC)   . OSA (obstructive sleep apnea)    moderate with AHI 26/hr with oxygen desaturations as low as 70%  . Scoliosis   . Sleep apnea   . SOB (shortness of breath)    Past Surgical History:  Procedure Laterality Date  . ABDOMINAL HYSTERECTOMY    .  ankle artery involved cyst removal    . CARDIAC CATHETERIZATION  05/14/2014   normal coronary arteries  . CARPAL TUNNEL RELEASE    . FASCIOTOMY     1993 for platar fasciitis  . harrington rod scoliosis     1981  . LEFT HEART CATHETERIZATION WITH CORONARY ANGIOGRAM N/A 05/14/2014   Procedure: LEFT HEART CATHETERIZATION WITH CORONARY ANGIOGRAM;  Surgeon: Iran Ouch, MD;  Location: [facility]ATH [facility];  Service: Cardiovascular;  Laterality: N/A;  . ROTATOR CUFF REPAIR    . UMBILICAL HERNIA REPAIR    . VESICOVAGINAL FISTULA CLOSURE W/ TAH      Family History  Problem Relation Age of Onset  . Emphysema Father   . Allergies Father   . Heart failure Father   . Heart disease Father 52       MI  . Diabetes Father   . Hyperlipidemia Father   . Hypertension Father   . Thyroid disease Father   . Alcohol abuse Father   . Obesity Father   . Diabetes Mother   . Hyperlipidemia Mother   . Obesity Mother    Social History   Socioeconomic History  . Marital status: Married    Spouse name: Not on file  . Number of children: Not on file  . Years of education: Not on file  . Highest education level: Not on file  Occupational History  . Occupation: disabled  Tobacco Use  . Smoking status: Former Smoker    Packs/day: 1.00    Years: 15.00    Pack years: 15.00    Types: Cigarettes    Quit date: 02/14/2005    Years since quitting: 15.1  . Smokeless tobacco: Never Used  Vaping Use  . Vaping Use: Never used  Substance and Sexual Activity  . Alcohol use: Yes    Comment: occ  . Drug use: No  . Sexual activity: Not on file  Other Topics Concern  . Not on file  Social History Narrative   Lives in [place] with spouse and son.   Previously worked as a Comptroller         Social Determinants of Community education officer: Not on [employer] Insecurity: Not on file  Transportation Needs: Not on file  Physical Activity: Not on file  Stress: Not on file  Social  Connections: Not on file    Review of Systems  Constitutional: Negative for chills, fatigue and fever.  HENT: Negative for congestion, ear pain and sore throat.   Respiratory: Positive for shortness of  breath. Negative for cough.   Cardiovascular: Negative for chest pain and palpitations.  Gastrointestinal: Positive for diarrhea (Off and on for several weeks). Negative for abdominal pain, constipation, nausea and vomiting.  Endocrine: Negative for polydipsia, polyphagia and polyuria.  Genitourinary: Negative for difficulty urinating and dysuria.  Musculoskeletal: Positive for arthralgias, back pain and myalgias.  Skin: Negative for rash.  Neurological: Positive for weakness. Negative for headaches.  Psychiatric/Behavioral: Negative for dysphoric mood. The patient is not nervous/anxious.      Objective:  BP 116/70   Pulse 84   Temp (!) 97.2 F (36.2 C)   Ht 5\' 6"  (1.676 m)   Wt 290 lb (131.5 kg)   SpO2 95%   BMI 46.81 kg/m   BP/Weight 04/08/2020 02/14/2020 01/13/2020  Systolic BP 116 95 120  Diastolic BP 70 60 72  Wt. (Lbs) 290 278 288  BMI 46.81 44.87 46.48    Physical Exam Vitals reviewed.  Constitutional:      Appearance: Normal appearance. She is obese.  Neck:     Vascular: No carotid bruit.  Cardiovascular:     Rate and Rhythm: Normal rate and regular rhythm.     Pulses: Normal pulses.     Heart sounds: Normal heart sounds.  Pulmonary:     Effort: Pulmonary effort is normal.     Breath sounds: Normal breath sounds.  Abdominal:     General: Bowel sounds are normal.     Palpations: Abdomen is soft.     Tenderness: There is no abdominal tenderness.  Neurological:     Mental Status: She is alert and oriented to person, place, and time.  Psychiatric:        Mood and Affect: Mood normal.        Behavior: Behavior normal.     Diabetic Foot Exam - Simple   Simple Foot Form Diabetic Foot exam was performed with the following findings: Yes 04/07/2020  9:20 PM   Visual Inspection No deformities, no ulcerations, no other skin breakdown bilaterally: Yes Sensation Testing See comments: Yes Pulse Check Posterior Tibialis and Dorsalis pulse intact bilaterally: Yes Comments Decreased sensation on the bottom of her feet.      Lab Results  Component Value Date   WBC 7.9 04/06/2020   HGB 14.8 04/06/2020   HCT 45.1 04/06/2020   PLT 222 04/06/2020   GLUCOSE 148 (H) 04/06/2020   CHOL 154 04/06/2020   TRIG 170 (H) 04/06/2020   HDL 42 04/06/2020   LDLCALC 83 04/06/2020   ALT 29 04/06/2020   AST 20 04/06/2020   NA 140 04/06/2020   K 4.7 04/06/2020   CL 102 04/06/2020   CREATININE 0.85 04/06/2020   BUN 16 04/06/2020   CO2 21 04/06/2020   TSH 3.070 04/06/2020   INR 1.0 05/13/2014   HGBA1C 7.3 (H) 04/06/2020   MICROALBUR Negative 09/03/2019      Assessment & Plan:   1. Chronic combined systolic and diastolic congestive heart failure (HCC) Decrease entresto to 97/103 1/2 tablet twice a day.  Hold carvedilol. Continue to check bps/Pulse twice a day.   2. Moderate recurrent major depression (HCC) The current medical regimen is effective;  continue present plan and medications.  3. Chronic pain syndrome Continue tramadol. - traMADol (ULTRAM) 50 MG tablet; Take 1 tablet (50 mg total) by mouth 2 (two) times daily.  Dispense: 60 tablet; Refill: 2  4. Hyperlipidemia associated with type 2 diabetes mellitus (HCC) Improved. Increase trulicity 3 mg weekly. Continue farxiga 10 mg  once daily. Continue diabetes diet. Free Style 2 CGM - samples given.  - Dulaglutide (TRULICITY) 3 MG/0.5ML SOPN; Inject 3 mg as directed once a week.  Dispense: 2 mL; Refill: 0  5. Other specified hypothyroidism The current medical regimen is effective;  continue present plan and medications.  6. Vitamin D deficiency - Vitamin D, Ergocalciferol, (DRISDOL) 1.25 MG (50000 UNIT) CAPS capsule; TAKE 1 CAPSULE EVERY 7 DAYS  Dispense: 12 capsule; Refill: 3  7.  Essential hypertension: Patient has been hypotensive several times in the last 3 weeks. Decrease entresto to 97/103 1/2 tablet twice a day.  Hold carvedilol. Continue to check bps/Pulse twice a day.  I will notify Dr. Mayford Knife.  8. OSA (obstructive sleep apnea) Continue CPAP.  9. Other hyperlipidemia Continue vascepa and lipitor.  10. Simple chronic bronchitis (HCC) Continue stiolto.  - albuterol (VENTOLIN HFA) 108 (90 Base) MCG/ACT inhaler; Inhale 2 puffs into the lungs every 6 (six) hours as needed for wheezing or shortness of breath.  Dispense: 8 g; Refill: 2    Meds ordered this encounter  Medications  . traMADol (ULTRAM) 50 MG tablet    Sig: Take 1 tablet (50 mg total) by mouth 2 (two) times daily.    Dispense:  60 tablet    Refill:  2  . DISCONTD: Dulaglutide (TRULICITY) 3 MG/0.5ML SOPN    Sig: Inject 3 mg as directed once a week.    Dispense:  2 mL    Refill:  0    Sending one month voucher RX bin [phone] PCN - [id number] Group # [id number] Identification # [id number] Exp: 02/13/2021  . Dulaglutide (TRULICITY) 3 MG/0.5ML SOPN    Sig: Inject 3 mg as directed once a week.    Dispense:  2 mL    Refill:  0    Sending one month voucher RX bin [phone] PCN - [id number] Group # [id number] Identification # [id number] Exp: 02/13/2021  . albuterol (VENTOLIN HFA) 108 (90 Base) MCG/ACT inhaler    Sig: Inhale 2 puffs into the lungs every 6 (six) hours as needed for wheezing or shortness of breath.    Dispense:  8 g    Refill:  2  . Vitamin D, Ergocalciferol, (DRISDOL) 1.25 MG (50000 UNIT) CAPS capsule    Sig: TAKE 1 CAPSULE EVERY 7 DAYS    Dispense:  12 capsule    Refill:  3    Follow-up: Return in about 2 weeks (around 04/22/2020).  An After Visit Summary was printed and given to the patient.  Blane Ohara, MD [facility] [phone]

## 2020-04-08 ENCOUNTER — Ambulatory Visit (INDEPENDENT_AMBULATORY_CARE_PROVIDER_SITE_OTHER): Payer: Medicare HMO | Admitting: Family Medicine

## 2020-04-08 ENCOUNTER — Other Ambulatory Visit: Payer: Self-pay

## 2020-04-08 VITALS — BP 116/70 | HR 84 | Temp 97.2°F | Ht 66.0 in | Wt 290.0 lb

## 2020-04-08 DIAGNOSIS — G4733 Obstructive sleep apnea (adult) (pediatric): Secondary | ICD-10-CM | POA: Diagnosis not present

## 2020-04-08 DIAGNOSIS — G894 Chronic pain syndrome: Secondary | ICD-10-CM | POA: Diagnosis not present

## 2020-04-08 DIAGNOSIS — E1169 Type 2 diabetes mellitus with other specified complication: Secondary | ICD-10-CM

## 2020-04-08 DIAGNOSIS — I10 Essential (primary) hypertension: Secondary | ICD-10-CM

## 2020-04-08 DIAGNOSIS — F331 Major depressive disorder, recurrent, moderate: Secondary | ICD-10-CM | POA: Diagnosis not present

## 2020-04-08 DIAGNOSIS — J410 Simple chronic bronchitis: Secondary | ICD-10-CM

## 2020-04-08 DIAGNOSIS — E785 Hyperlipidemia, unspecified: Secondary | ICD-10-CM

## 2020-04-08 DIAGNOSIS — E038 Other specified hypothyroidism: Secondary | ICD-10-CM

## 2020-04-08 DIAGNOSIS — E559 Vitamin D deficiency, unspecified: Secondary | ICD-10-CM | POA: Diagnosis not present

## 2020-04-08 DIAGNOSIS — E7849 Other hyperlipidemia: Secondary | ICD-10-CM

## 2020-04-08 DIAGNOSIS — I5042 Chronic combined systolic (congestive) and diastolic (congestive) heart failure: Secondary | ICD-10-CM

## 2020-04-08 MED ORDER — TRAMADOL HCL 50 MG PO TABS: 50.0000 mg | ORAL_TABLET | Freq: Two times a day (BID) | ORAL | 2 refills | Status: DC

## 2020-04-08 MED ORDER — TRULICITY 3 MG/0.5ML ~~LOC~~ SOAJ: 3.0000 mg | SUBCUTANEOUS | 0 refills | Status: AC

## 2020-04-08 MED ORDER — TRULICITY 3 MG/0.5ML ~~LOC~~ SOAJ: 3.0000 mg | SUBCUTANEOUS | 0 refills | Status: DC

## 2020-04-08 MED ORDER — VITAMIN D (ERGOCALCIFEROL) 1.25 MG (50000 UNIT) PO CAPS
ORAL_CAPSULE | ORAL | 3 refills | Status: DC
Start: 2020-04-08 — End: 2021-01-04

## 2020-04-08 MED ORDER — ALBUTEROL SULFATE HFA 108 (90 BASE) MCG/ACT IN AERS: 2.0000 | INHALATION_SPRAY | Freq: Four times a day (QID) | RESPIRATORY_TRACT | 2 refills | Status: DC | PRN

## 2020-04-08 NOTE — Patient Instructions (Addendum)
Hypotension: Decrease entresto to 97/103 1/2 tablet twice a day.  Hold carvedilol.  Continue to check bps/Pulse twice a day.   Increase trulicity 3 mg once weekly.

## 2020-04-09 ENCOUNTER — Other Ambulatory Visit: Payer: Self-pay

## 2020-04-09 DIAGNOSIS — F410 Panic disorder [episodic paroxysmal anxiety] without agoraphobia: Secondary | ICD-10-CM

## 2020-04-09 MED ORDER — CLONAZEPAM 0.5 MG PO TABS: 0.5000 mg | ORAL_TABLET | Freq: Two times a day (BID) | ORAL | 0 refills | Status: DC | PRN

## 2020-04-11 DIAGNOSIS — J4520 Mild intermittent asthma, uncomplicated: Secondary | ICD-10-CM | POA: Diagnosis not present

## 2020-04-11 DIAGNOSIS — J449 Chronic obstructive pulmonary disease, unspecified: Secondary | ICD-10-CM | POA: Diagnosis not present

## 2020-04-12 ENCOUNTER — Encounter: Payer: Self-pay | Admitting: Family Medicine

## 2020-04-20 ENCOUNTER — Other Ambulatory Visit: Payer: Self-pay

## 2020-04-20 ENCOUNTER — Telehealth: Payer: Medicare HMO | Admitting: Cardiology

## 2020-04-20 ENCOUNTER — Encounter: Payer: Self-pay | Admitting: Cardiology

## 2020-04-20 ENCOUNTER — Encounter: Payer: Medicare HMO | Admitting: Cardiology

## 2020-04-20 VITALS — BP 110/60 | HR 95

## 2020-04-20 VITALS — BP 110/60 | HR 93 | Ht 66.0 in | Wt 287.2 lb

## 2020-04-20 DIAGNOSIS — I10 Essential (primary) hypertension: Secondary | ICD-10-CM | POA: Diagnosis not present

## 2020-04-20 DIAGNOSIS — I5032 Chronic diastolic (congestive) heart failure: Secondary | ICD-10-CM | POA: Diagnosis not present

## 2020-04-20 DIAGNOSIS — G4733 Obstructive sleep apnea (adult) (pediatric): Secondary | ICD-10-CM | POA: Diagnosis not present

## 2020-04-20 DIAGNOSIS — I420 Dilated cardiomyopathy: Secondary | ICD-10-CM

## 2020-04-20 MED ORDER — ENTRESTO 24-26 MG PO TABS: 1.0000 | ORAL_TABLET | Freq: Two times a day (BID) | ORAL | 11 refills | Status: DC

## 2020-04-20 MED ORDER — CARVEDILOL 6.25 MG PO TABS
6.2500 mg | ORAL_TABLET | Freq: Two times a day (BID) | ORAL | 3 refills | Status: DC
Start: 2020-04-20 — End: 2020-07-20

## 2020-04-20 NOTE — Patient Instructions (Signed)
Medication Instructions:  Your physician has recommended you make the following change in your medication:  1) DECREASE Entresto to 24-26 mg twice daily  2) RESTART carvedilol 6.25 mg twice daily  *If you need a refill on your cardiac medications before your next appointment, please call your pharmacy*  Follow-Up: At [facility], you and your health needs are our priority.  As part of our continuing mission to provide you with exceptional heart care, we have created designated Provider Care Teams.  These Care Teams include your primary Cardiologist (physician) and Advanced Practice Providers (APPs -  Physician Assistants and Nurse Practitioners) who all work together to provide you with the care you need, when you need it.  Your next appointment:   3 month(s)  The format for your next appointment:   In Person  Provider:   You may see Armanda Magic, MD or one of the following Advanced Practice Providers on your designated Care Team:    Ronie Spies, PA-C  Jacolyn Reedy, PA-C    Other Instructions You have been referred to see our PharmD in one week.

## 2020-04-20 NOTE — Progress Notes (Signed)
This encounter was created in error - please disregard.

## 2020-04-20 NOTE — Progress Notes (Signed)
Virtual Visit via Video Note   This visit type was conducted due to national recommendations for restrictions regarding the COVID-19 Pandemic (e.g. social distancing) in an effort to limit this patient's exposure and mitigate transmission in our community.  Due to her co-morbid illnesses, this patient is at least at moderate risk for complications without adequate follow up.  This format is felt to be most appropriate for this patient at this time.  All issues noted in this document were discussed and addressed.  A limited physical exam was performed with this format.  Please refer to the patient's chart for her consent to telehealth for [facility].  Date:  04/20/2020   ID:  Judy [name], DOB [birth year], MRN [id number] The patient was identified using 2 identifiers.  Patient Location: Other:  MD office Provider Location: Office/Clinic   ID:  Judy [name], DOB 05/09/1960, MRN [id number]  PCP:  Blane Ohara, MD           Cardiologist:  Armanda Magic, MD Electrophysiologist:  None   Chief Complaint:  OSA, HICM, CHF, HTN, HLD  History of Present Illness:    Judy [name] is a 60 y.o. female with a hx of NICM, chronic combined CHF,OSA on PAP,COPD, former tobacco abuse, HTN, HLD, morbid obesity. Prior echo 12/2013 showed EF 45-50%. She had abnormal stress test 04/2014 resulting in cardiac cath 05/14/14 showing no significant CAD, EF 30%.   Subsequent echo 08/2014 (poor quality) with EF approximately 35%. She was treated with medical therapy and subsequent cMRI 09/2014 showed diffuse HK, EF 28%, no infiltrative disease.  AICD recommended but she had been hesitant to pursue. F/U echo was obtained 07/29/16 showed EF approximately 45-50%, mild LVH, grade 2 DD, mild MR, mildly reduced RV function, PASP .Repeat echo 5/6/2019showed normalization of LV function with EF 55%.  She is here today for followup and is doing well.  She has chronic DOE that is stable and has not  changed since I saw her last. She denies any chest pain or pressure PND, orthopnea, LE edema, dizziness, palpitations or syncope. She is compliant with her meds and is tolerating meds with no SE.    She is doing well with her CPAP device and thinks that she has gotten used to it.  She tolerates the mask and feels the pressure is adequate.  Since going on CPAP she feels rested in the am and has no significant daytime sleepiness.  She denies any significant mouth or nasal dryness but has some nasal congestion and uses a nasal rinse that helps.  She does not think that he snores.    Prior CV studies:   The following studies were reviewed today:  PAP compliance report      Past Medical History:  Diagnosis Date  . Anxiety   . Back pain   . Bilateral swelling of feet   . Chronic combined systolic and diastolic CHF (congestive heart failure) (HCC) 10/07/2014  . COPD (chronic obstructive pulmonary disease) (HCC)   . DCM (dilated cardiomyopathy) (HCC) 04/25/2014   EF 28% by MRI despite maximum medical therapy  . Depression   . Diabetes mellitus without complication (HCC)   . Epilepsy (HCC)    as a child  . Fatty liver   . Former tobacco use   . Hyperlipidemia   . Hypertension   . Hypothyroidism   . Joint pain   . Leg swelling   . Morbid obesity (HCC)   . NICM (nonischemic  cardiomyopathy) (HCC)   . OSA (obstructive sleep apnea)    moderate with AHI 26/hr with oxygen desaturations as low as 70%  . Scoliosis   . Sleep apnea   . SOB (shortness of breath)         Past Surgical History:  Procedure Laterality Date  . ABDOMINAL HYSTERECTOMY    . ankle artery involved cyst removal    . CARDIAC CATHETERIZATION  05/14/2014   normal coronary arteries  . CARPAL TUNNEL RELEASE    . FASCIOTOMY     1993 for platar fasciitis  . harrington rod scoliosis     1981  . LEFT HEART CATHETERIZATION WITH CORONARY ANGIOGRAM N/A 05/14/2014   Procedure: LEFT HEART  CATHETERIZATION WITH CORONARY ANGIOGRAM;  Surgeon: Iran Ouch, MD;  Location: [facility] CATH [facility];  Service: Cardiovascular;  Laterality: N/A;  . ROTATOR CUFF REPAIR    . UMBILICAL HERNIA REPAIR    . VESICOVAGINAL FISTULA CLOSURE W/ TAH       Active Medications  No outpatient medications have been marked as taking for the 04/20/20 encounter (Office Visit) with Quintella Reichert, MD.       Allergies:   Demerol [meperidine]   Social History        Tobacco Use  . Smoking status: Former Smoker    Packs/day: 1.00    Years: 15.00    Pack years: 15.00    Types: Cigarettes    Quit date: 02/14/2005    Years since quitting: 15.1  . Smokeless tobacco: Never Used  Vaping Use  . Vaping Use: Never used  Substance Use Topics  . Alcohol use: Yes    Comment: occ  . Drug use: No     Family Hx: The patient's family history includes Alcohol abuse in her father; Allergies in her father; Diabetes in her father and mother; Emphysema in her father; Heart disease (age of onset: 27) in her father; Heart failure in her father; Hyperlipidemia in her father and mother; Hypertension in her father; Obesity in her father and mother; Thyroid disease in her father.  ROS:   Please see the history of present illness.     All other systems reviewed and are negative.   Labs/Other Tests and Data Reviewed:    Recent Labs: 04/06/2020: ALT 29; BUN 16; Creatinine, Ser 0.85; Hemoglobin 14.8; Platelets 222; Potassium 4.7; Sodium 140; TSH 3.070   Recent Lipid Panel Labs (Brief)       Lab Results  Component Value Date/Time   CHOL 154 04/06/2020 02:50 PM   TRIG 170 (H) 04/06/2020 02:50 PM   HDL 42 04/06/2020 02:50 PM   CHOLHDL 3.7 04/06/2020 02:50 PM   CHOLHDL 3.6 07/12/2016 11:15 AM   LDLCALC 83 04/06/2020 02:50 PM         Wt Readings from Last 3 Encounters:  04/08/20 290 lb (131.5 kg)  02/25/20 278 lb (126.1 kg)  01/13/20 288 lb (130.6 kg)     Objective:     Vital Signs:  BP 110/43mmHg and HR 95 with O2 sats 93% on RA  GEN: Well nourished, well developed in no acute distress HEENT: Normal NECK: No JVD; No carotid bruits LYMPHATICS: No lymphadenopathy CARDIAC:RRR, no murmurs, rubs, gallops RESPIRATORY:  Clear to auscultation without rales, wheezing or rhonchi  ABDOMEN: Soft, non-tender, non-distended MUSCULOSKELETAL:  No edema; No deformity  SKIN: Warm and dry NEUROLOGIC:  Alert and oriented x 3 PSYCHIATRIC:  Normal affect    ASSESSMENT & PLAN:    1.  Chronic  diastolic CHF -she is currently NYHA class 2b -she has chronic DOE which is multifactorial from CHF as well as morbid obesity -her weight remains stable and she denies any significant LE edema and her breathing is stable -She sees pulmonary for her COPD -Continue Lasix PRN>> she has not used any recently  2.  Nonischemic DCM -essentially normal coronary arteries by cath 2016 -DCM resolved with EF 55% on echo 2018 -her PCP decreased her Entresto to 49-51mg  BID and Carvedilol was stopped due to hypotension -BP has improved on lower dose of Entresto and off Carvedilol -HR is on the high side of normal so I will restart Carvedilol 6.25mg  BID and decrease Entresto to 24-26mg  BID -Followup with PharmD in 1 week SCr stable at 0.85 and K+ 4.7 in Feb 2022  3.  HTN -Bp well controlled on exam today -See #2>>decreasing entresto and restarting Carvedilol due to elevated HR  4.  OSA - The patient is tolerating PAP therapy well without any problems. The PAP download was reviewed today and showed an AHI of 0.6/hr on 13 cm H2O with 100% compliance in using more than 4 hours nightly.  The patient has been using and benefiting from PAP use and will continue to benefit from therapy.   5.  Morbid Obesity -I have encouraged her to cut back on carbs and portions. -she uses a cane to walk due to back problems  -she went to healthy weight and wellness clinic but did not find it  helpful  COVID-19 Education: The signs and symptoms of COVID-19 were discussed with the patient and how to seek care for testing (follow up with PCP or arrange E-visit).  The importance of social distancing was discussed today.  Time:   Today, I have spent 20 minutes with the patient with telehealth technology discussing the above problems.        Medication Adjustments/Labs and Tests Ordered: Current medicines are reviewed at length with the patient today.  Concerns regarding medicines are outlined above.   Tests Ordered: Orders Placed This Encounter  Procedures  . AMB Referral to [facility]    Medication Changes: Meds ordered this encounter  Medications  . sacubitril-valsartan (ENTRESTO) 24-26 MG    Sig: Take 1 tablet by mouth 2 (two) times daily.    Dispense:  60 tablet    Refill:  11  . carvedilol (COREG) 6.25 MG tablet    Sig: Take 1 tablet (6.25 mg total) by mouth 2 (two) times daily.    Dispense:  180 tablet    Refill:  3    Follow Up:  In Person in 3 month(s)  Signed, Armanda Magic, MD  04/20/2020 12:45 PM    [facility] Medication Adjustments/Labs and Tests Ordered:

## 2020-04-27 ENCOUNTER — Other Ambulatory Visit: Payer: Self-pay

## 2020-04-27 ENCOUNTER — Ambulatory Visit (INDEPENDENT_AMBULATORY_CARE_PROVIDER_SITE_OTHER): Payer: Medicare HMO | Admitting: Family Medicine

## 2020-04-27 ENCOUNTER — Encounter: Payer: Self-pay | Admitting: Family Medicine

## 2020-04-27 VITALS — BP 110/64 | HR 100 | Temp 97.8°F | Resp 20 | Ht 66.0 in | Wt 285.0 lb

## 2020-04-27 DIAGNOSIS — E1165 Type 2 diabetes mellitus with hyperglycemia: Secondary | ICD-10-CM

## 2020-04-27 DIAGNOSIS — I9589 Other hypotension: Secondary | ICD-10-CM | POA: Diagnosis not present

## 2020-04-27 DIAGNOSIS — I5042 Chronic combined systolic (congestive) and diastolic (congestive) heart failure: Secondary | ICD-10-CM

## 2020-04-27 NOTE — Progress Notes (Signed)
Subjective:  Patient ID: Judy [name], female    DOB: 20-Dec-1960  Age: 60 y.o. MRN: [id number]  Chief Complaint  Patient presents with  . Hypotension    HPI Patient presents for follow-up of her low blood pressure.  She has not had any recurrent low blood pressures.  She did follow-up with Dr. Mayford Knife who decreased her Sherryll Burger as in the list below.  Patient shortness of breath is her baseline.  She denies chest pain.  Oddly enough she reports that she has abdominal pain when her blood pressure goes low.  She has been using her freestyle libre at least for period time it stayed on which was approximately 3 days.  She said her sugars were improved.  She is going to reapply another and get the patches to be sure does not fall off.  Current Outpatient Medications on File Prior to Visit  Medication Sig Dispense Refill  . albuterol (VENTOLIN HFA) 108 (90 Base) MCG/ACT inhaler Inhale 2 puffs into the lungs every 6 (six) hours as needed for wheezing or shortness of breath. 8 g 2  . aspirin 81 MG tablet Take 81 mg by mouth at bedtime.     Marland Kitchen atorvastatin (LIPITOR) 20 MG tablet Take 20 mg by mouth daily.    Marland Kitchen buPROPion (WELLBUTRIN XL) 300 MG 24 hr tablet Take 1 tablet (300 mg total) by mouth daily. 90 tablet 1  . carvedilol (COREG) 6.25 MG tablet Take 1 tablet (6.25 mg total) by mouth 2 (two) times daily. 180 tablet 3  . clonazePAM (KLONOPIN) 0.5 MG tablet Take 1 tablet (0.5 mg total) by mouth 2 (two) times daily as needed for anxiety (panic attacks). 180 tablet 0  . Coenzyme Q10 (CO Q 10) 100 MG CAPS Take 200 mg by mouth daily.    . Dulaglutide (TRULICITY) 3 MG/0.5ML SOPN Inject 3 mg as directed once a week. 2 mL 0  . FARXIGA 10 MG TABS tablet Take 1 tablet (10 mg total) by mouth daily. 90 tablet 3  . gabapentin (NEURONTIN) 400 MG capsule Take 2 tablets by mouth in the AM and 2 by mouth in the PM  1  . levothyroxine (SYNTHROID) 75 MCG tablet TAKE 1 TABLET (75 MCG TOTAL) BY MOUTH DAILY. 90 tablet 1   . Lutein 20 MG TABS Take 1 tablet by mouth daily.    . Multiple Vitamin (MULTIVITAMIN WITH MINERALS) TABS tablet Take 1 tablet by mouth daily.    . OXYGEN 2lpm 2 lpm as needed    . sacubitril-valsartan (ENTRESTO) 24-26 MG Take 1 tablet by mouth 2 (two) times daily. 60 tablet 11  . Tiotropium Bromide-Olodaterol (STIOLTO RESPIMAT) 2.5-2.5 MCG/ACT AERS INHALE 2 PUFFS INTO THE LUNGS DAILY. 12 g 5  . tiZANidine (ZANAFLEX) 4 MG tablet Take 1 tablet (4 mg total) by mouth every 6 (six) hours as needed for muscle spasms. 360 tablet 1  . traMADol (ULTRAM) 50 MG tablet Take 1 tablet (50 mg total) by mouth 2 (two) times daily. 60 tablet 2  . UNABLE TO FIND CPAP with o2 2lpm  DME- [facility]    . VASCEPA 1 g capsule TAKE 2 CAPSULES TWICE DAILY 360 capsule 1  . Vitamin D, Ergocalciferol, (DRISDOL) 1.25 MG (50000 UNIT) CAPS capsule TAKE 1 CAPSULE EVERY 7 DAYS 12 capsule 3   No current facility-administered medications on file prior to visit.   Past Medical History:  Diagnosis Date  . Anxiety   . Back pain   . Bilateral swelling of  feet   . Chronic combined systolic and diastolic CHF (congestive heart failure) (HCC) 10/07/2014  . COPD (chronic obstructive pulmonary disease) (HCC)   . DCM (dilated cardiomyopathy) (HCC) 04/25/2014   EF 28% by MRI despite maximum medical therapy  . Depression   . Diabetes mellitus without complication (HCC)   . Epilepsy (HCC)    as a child  . Fatty liver   . Former tobacco use   . Hyperlipidemia   . Hypertension   . Hypothyroidism   . Joint pain   . Leg swelling   . Morbid obesity (HCC)   . NICM (nonischemic cardiomyopathy) (HCC)   . OSA (obstructive sleep apnea)    moderate with AHI 26/hr with oxygen desaturations as low as 70%  . Scoliosis   . Sleep apnea   . SOB (shortness of breath)    Past Surgical History:  Procedure Laterality Date  . ABDOMINAL HYSTERECTOMY    . ankle artery involved cyst removal    . CARDIAC CATHETERIZATION  05/14/2014   normal coronary  arteries  . CARPAL TUNNEL RELEASE    . FASCIOTOMY     1993 for platar fasciitis  . harrington rod scoliosis     1981  . LEFT HEART CATHETERIZATION WITH CORONARY ANGIOGRAM N/A 05/14/2014   Procedure: LEFT HEART CATHETERIZATION WITH CORONARY ANGIOGRAM;  Surgeon: Iran Ouch, MD;  Location: [facility] CATH LAB;  Service: Cardiovascular;  Laterality: N/A;  . ROTATOR CUFF REPAIR    . UMBILICAL HERNIA REPAIR    . VESICOVAGINAL FISTULA CLOSURE W/ TAH      Family History  Problem Relation Age of Onset  . Emphysema Father   . Allergies Father   . Heart failure Father   . Heart disease Father 75       MI  . Diabetes Father   . Hyperlipidemia Father   . Hypertension Father   . Thyroid disease Father   . Alcohol abuse Father   . Obesity Father   . Diabetes Mother   . Hyperlipidemia Mother   . Obesity Mother    Social History   Socioeconomic History  . Marital status: Married    Spouse name: Not on file  . Number of children: Not on file  . Years of education: Not on file  . Highest education level: Not on file  Occupational History  . Occupation: disabled  Tobacco Use  . Smoking status: Former Smoker    Packs/day: 1.00    Years: 15.00    Pack years: 15.00    Types: Cigarettes    Quit date: 02/14/2005    Years since quitting: 15.2  . Smokeless tobacco: Never Used  Vaping Use  . Vaping Use: Never used  Substance and Sexual Activity  . Alcohol use: Yes    Comment: occ  . Drug use: No  . Sexual activity: Not on file  Other Topics Concern  . Not on file  Social History Narrative   Lives in [place] with spouse and son.   Previously worked as a Comptroller         Social Determinants of Community education officer: Not on [employer] Insecurity: Not on file  Transportation Needs: Not on file  Physical Activity: Not on file  Stress: Not on file  Social Connections: Not on file    Review of Systems  Constitutional: Negative for chills, fatigue  and fever.  HENT: Positive for congestion and rhinorrhea. Negative for sore throat.  Respiratory: Positive for shortness of breath. Negative for cough.   Cardiovascular: Negative for chest pain and palpitations.  Gastrointestinal: Negative for abdominal pain, constipation, diarrhea, nausea and vomiting.  Genitourinary: Negative for dysuria and urgency.       Poor bladder control  Musculoskeletal: Positive for arthralgias, back pain and myalgias.  Neurological: Negative for dizziness, weakness, light-headedness and headaches.  Psychiatric/Behavioral: Negative for dysphoric mood. The patient is not nervous/anxious.      Objective:  BP 110/64   Pulse 100   Temp 97.8 F (36.6 C)   Resp 20   Ht 5\' 6"  (1.676 m)   Wt 285 lb (129.3 kg)   BMI 46.00 kg/m   BP/Weight 04/27/2020 04/20/2020 04/20/2020  Systolic BP 110 110 110  Diastolic BP 64 60 60  Wt. (Lbs) 285 - 287.2  BMI 46 - 46.36    Physical Exam Vitals reviewed.  Constitutional:      Appearance: She is obese.  Cardiovascular:     Rate and Rhythm: Normal rate and regular rhythm.     Heart sounds: Normal heart sounds.  Pulmonary:     Effort: Pulmonary effort is normal.     Breath sounds: Normal breath sounds.  Neurological:     Mental Status: She is alert and oriented to person, place, and time.  Psychiatric:        Mood and Affect: Mood normal.        Behavior: Behavior normal.     Diabetic Foot Exam - Simple   No data filed      Lab Results  Component Value Date   WBC 7.9 04/06/2020   HGB 14.8 04/06/2020   HCT 45.1 04/06/2020   PLT 222 04/06/2020   GLUCOSE 148 (H) 04/06/2020   CHOL 154 04/06/2020   TRIG 170 (H) 04/06/2020   HDL 42 04/06/2020   LDLCALC 83 04/06/2020   ALT 29 04/06/2020   AST 20 04/06/2020   NA 140 04/06/2020   K 4.7 04/06/2020   CL 102 04/06/2020   CREATININE 0.85 04/06/2020   BUN 16 04/06/2020   CO2 21 04/06/2020   TSH 3.070 04/06/2020   INR 1.0 05/13/2014   HGBA1C 7.3 (H) 04/06/2020    MICROALBUR Negative 09/03/2019      Assessment & Plan:   1. Other specified hypotension Resolved with medication changes.  2. Chronic combined systolic and diastolic congestive heart failure (HCC) No signs of worsening congestive heart failure with decrease in Entresto.  3. Type 2 diabetes mellitus with hyperglycemia, without long-term current use of insulin (HCC) Recommend patient get adhesive bandages for freestyle libre 2.  It is very important the patient keeps track of her sugars.  Follow-up: Return in about 10 weeks (around 07/06/2020).  An After Visit Summary was printed and given to the patient.  07/08/2020, MD [facility] [phone]

## 2020-04-30 ENCOUNTER — Other Ambulatory Visit: Payer: Self-pay

## 2020-04-30 ENCOUNTER — Ambulatory Visit (INDEPENDENT_AMBULATORY_CARE_PROVIDER_SITE_OTHER): Payer: Medicare HMO | Admitting: Pharmacist

## 2020-04-30 VITALS — BP 106/68 | HR 77

## 2020-04-30 DIAGNOSIS — I10 Essential (primary) hypertension: Secondary | ICD-10-CM | POA: Diagnosis not present

## 2020-04-30 DIAGNOSIS — I420 Dilated cardiomyopathy: Secondary | ICD-10-CM

## 2020-04-30 NOTE — Patient Instructions (Addendum)
Your blood pressure goal is < 130/85mmHg  Continue taking your current medications  Monitor your blood pressure at home. If your systolic blood pressure consisently drops < 110, we can change your carvedilol to metoprolol which has less of an effect on your blood pressure  Call Cliff Damiani, Pharmacist with any concerns #[phone]

## 2020-04-30 NOTE — Progress Notes (Signed)
Patient ID: Judy [name]                 DOB: 1960-09-27                      MRN: [id number]     HPI: Judy [name] is a 60 y.o. female referred by Dr. Mayford Knife to pharmacy clinic for HF medication management. PMH is significant for NICM, chronic combined CHF, OSA on CPAP, COPD, former tobacco abuse, HTN, HLD, DM, and morbid obesity. Prior echo 12/2013 showed EF 45-50%. She had abnormal stress test 04/2014 resulting in cardiac cath 05/14/14 showing no significant CAD, EF 30%. Subsequent echo 08/2014 (poor quality) with EF approximately 35%. Most recent LVEF was 55% on 06/19/17.  At last visit on 04/20/20, pt evaluated as NYHA class 2b with chronic DOE multifactorial from CHF as well as morbid obesity. Entresto dose had previously been decreased to 49-51mg  BID and carvedilol was stopped secondary to hypotension by PCP. Dr Mayford Knife restarted carvedilol at 6.25mg  BID and decreased Entresto to 24-26mg  BID at last visit due to elevated HR and low/normal BP.  Pt presents today in good spirits. Reports tolerating her medications well. Denies dizziness, balance problems, LEE, and energy changes. Checks BP twice a day at home using bicep cuff she's had for about a year that calibrated well at her PCP office. Reports home BP readings have been perfect, 124/78 the other day, usually 110-120s systolic and never < 100. Prior symptom of hypotension was a pain in her stomach, she never experienced any dizziness. Previously saw healthy weight and wellness clinic but visits 1-2x per month were not frequent enough for her. She used to use a program in [place] that promoted a diet high in vegetables and protein but she would have check ins 3x per week to hold her accountable that were more beneficial for her and helped her lose 80 lbs. The woman who ran this program has since passed, but she does have some of the papers at home with previous dietary tips.  Current CHF meds: carvedilol 6.25mg  BID, Farxiga 10mg  daily, Entresto  24-26mg  BID Previously tried: spironolactone - stopped due to hypotension BP goal: <130/81mmHg  Family History: Alcohol abuse in her father; Allergies in her father; Diabetes in her father and mother; Emphysema in her father; Heart disease (age of onset: 10) in her father; Heart failure in her father; Hyperlipidemia in her father and mother; Hypertension in her father; Obesity in her father and mother; Thyroid disease in her father.  Social History: Former tobacco use, smoked 1 PPD for 15 years, quit in 2007. Occasional alcohol use.  Diet: Breakfast - cereal. Lunch - sandwich. Dinner - cooks at home. Likes chicken, hot dogs. Used to follow high protein and veggie diet and lost 80 lbs.   Exercise: Metal rod in her back, ambulates with cane. Exercise is limited.  Home BP readings: all at goal, checks twice daily  Wt Readings from Last 3 Encounters:  04/27/20 285 lb (129.3 kg)  04/20/20 287 lb 3.2 oz (130.3 kg)  04/08/20 290 lb (131.5 kg)   BP Readings from Last 3 Encounters:  04/27/20 110/64  04/20/20 110/60  04/20/20 110/60   Pulse Readings from Last 3 Encounters:  04/27/20 100  04/20/20 93  04/20/20 95    Renal function: CrCl cannot be calculated (Patient's most recent lab result is older than the maximum 21 days allowed.).  Past Medical History:  Diagnosis Date  . Anxiety   .  Back pain   . Bilateral swelling of feet   . Chronic combined systolic and diastolic CHF (congestive heart failure) (HCC) 10/07/2014  . COPD (chronic obstructive pulmonary disease) (HCC)   . DCM (dilated cardiomyopathy) (HCC) 04/25/2014   EF 28% by MRI despite maximum medical therapy  . Depression   . Diabetes mellitus without complication (HCC)   . Epilepsy (HCC)    as a child  . Fatty liver   . Former tobacco use   . Hyperlipidemia   . Hypertension   . Hypothyroidism   . Joint pain   . Leg swelling   . Morbid obesity (HCC)   . NICM (nonischemic cardiomyopathy) (HCC)   . OSA (obstructive  sleep apnea)    moderate with AHI 26/hr with oxygen desaturations as low as 70%  . Scoliosis   . Sleep apnea   . SOB (shortness of breath)     Current Outpatient Medications on File Prior to Visit  Medication Sig Dispense Refill  . albuterol (VENTOLIN HFA) 108 (90 Base) MCG/ACT inhaler Inhale 2 puffs into the lungs every 6 (six) hours as needed for wheezing or shortness of breath. 8 g 2  . aspirin 81 MG tablet Take 81 mg by mouth at bedtime.     Marland Kitchen atorvastatin (LIPITOR) 20 MG tablet Take 20 mg by mouth daily.    Marland Kitchen buPROPion (WELLBUTRIN XL) 300 MG 24 hr tablet Take 1 tablet (300 mg total) by mouth daily. 90 tablet 1  . carvedilol (COREG) 6.25 MG tablet Take 1 tablet (6.25 mg total) by mouth 2 (two) times daily. 180 tablet 3  . clonazePAM (KLONOPIN) 0.5 MG tablet Take 1 tablet (0.5 mg total) by mouth 2 (two) times daily as needed for anxiety (panic attacks). 180 tablet 0  . Coenzyme Q10 (CO Q 10) 100 MG CAPS Take 200 mg by mouth daily.    . Dulaglutide (TRULICITY) 3 MG/0.5ML SOPN Inject 3 mg as directed once a week. 2 mL 0  . FARXIGA 10 MG TABS tablet Take 1 tablet (10 mg total) by mouth daily. 90 tablet 3  . gabapentin (NEURONTIN) 400 MG capsule Take 2 tablets by mouth in the AM and 2 by mouth in the PM  1  . levothyroxine (SYNTHROID) 75 MCG tablet TAKE 1 TABLET (75 MCG TOTAL) BY MOUTH DAILY. 90 tablet 1  . Lutein 20 MG TABS Take 1 tablet by mouth daily.    . Multiple Vitamin (MULTIVITAMIN WITH MINERALS) TABS tablet Take 1 tablet by mouth daily.    . OXYGEN 2lpm 2 lpm as needed    . sacubitril-valsartan (ENTRESTO) 24-26 MG Take 1 tablet by mouth 2 (two) times daily. 60 tablet 11  . Tiotropium Bromide-Olodaterol (STIOLTO RESPIMAT) 2.5-2.5 MCG/ACT AERS INHALE 2 PUFFS INTO THE LUNGS DAILY. 12 g 5  . tiZANidine (ZANAFLEX) 4 MG tablet Take 1 tablet (4 mg total) by mouth every 6 (six) hours as needed for muscle spasms. 360 tablet 1  . traMADol (ULTRAM) 50 MG tablet Take 1 tablet (50 mg total) by  mouth 2 (two) times daily. 60 tablet 2  . UNABLE TO FIND CPAP with o2 2lpm  DME- [facility]    . VASCEPA 1 g capsule TAKE 2 CAPSULES TWICE DAILY 360 capsule 1  . Vitamin D, Ergocalciferol, (DRISDOL) 1.25 MG (50000 UNIT) CAPS capsule TAKE 1 CAPSULE EVERY 7 DAYS 12 capsule 3   No current facility-administered medications on file prior to visit.    Allergies  Allergen Reactions  . Demerol [  Meperidine] Nausea And Vomiting     Assessment/Plan:  1. CHF - BP at goal <130/61mmHg in clinic and with home readings, a bit on the soft side today in office. Will continue Entresto 24-26mg  BID, carvedilol 6.25mg  BID, and Farxiga 10mg  daily for CHF benefit. Spironolactone previously discontinued due to hypotension. Advised pt to monitor BP readings at home and if SBP consistently trends < 110, we can change her carvedilol to Toprol for less BP effect. Also encouraged pt to focus on diet high in vegetables and lean protein as this previously helped her lose about 80 lbs. Will call pt in 3 weeks to follow up with home BP readings to ensure she isn't experiencing notable hypotension.  Judy [name], PharmD, BCACP, CPP [facility] [address] [place], [place] Phone: [phone]; Fax: [phone] 04/30/2020 10:32 AM

## 2020-05-01 ENCOUNTER — Other Ambulatory Visit: Payer: Self-pay | Admitting: Family Medicine

## 2020-05-01 ENCOUNTER — Other Ambulatory Visit: Payer: Self-pay

## 2020-05-01 DIAGNOSIS — G4733 Obstructive sleep apnea (adult) (pediatric): Secondary | ICD-10-CM | POA: Diagnosis not present

## 2020-05-01 MED ORDER — ENTRESTO 24-26 MG PO TABS: 1.0000 | ORAL_TABLET | Freq: Two times a day (BID) | ORAL | 3 refills | Status: DC

## 2020-05-09 DIAGNOSIS — J4520 Mild intermittent asthma, uncomplicated: Secondary | ICD-10-CM | POA: Diagnosis not present

## 2020-05-09 DIAGNOSIS — J449 Chronic obstructive pulmonary disease, unspecified: Secondary | ICD-10-CM | POA: Diagnosis not present

## 2020-05-11 NOTE — Progress Notes (Signed)
Chronic Care Management Pharmacy Note  05/14/2020 Name:  Judy [name] MRN:  [id number] DOB:  [birth year]   Plan Updates:   Pharmacist applying for Delene Loll through [employer] Patient assistance. Patient's pan foundation funding has been exhausted for this year.   Pharmacist coordinating Trulicity 3 mg weekly through [employer].   Pharmacist coordinated application for [employer] to cover cost of Vascepa.   Discussed benefit of pool exercise at the [facility].   Subjective: Judy [name] is an 60 y.o. year old female who is a primary patient of Cox, Kirsten, MD.  The CCM team was consulted for assistance with disease management and care coordination needs.    Engaged with patient face to face for initial visit in response to provider referral for pharmacy case management and/or care coordination services.   Consent to Services:  The patient was given the following information about Chronic Care Management services today, agreed to services, and gave verbal consent: 1. CCM service includes personalized support from designated clinical staff supervised by the primary care provider, including individualized plan of care and coordination with other care providers 2. 24/7 contact phone numbers for assistance for urgent and routine care needs. 3. Service will only be billed when office clinical staff spend 20 minutes or more in a month to coordinate care. 4. Only one practitioner may furnish and bill the service in a calendar month. 5.The patient may stop CCM services at any time (effective at the end of the month) by phone call to the office staff. 6. The patient will be responsible for cost sharing (co-pay) of up to 20% of the service fee (after annual deductible is met). Patient agreed to services and consent obtained.  Patient Care Team: Rochel Brome, MD as PCP - General (Family Medicine) Sueanne Margarita, MD as PCP - Cardiology (Cardiology) Burnice Logan, [facility] as Pharmacist  (Pharmacist)  Recent office visits: 04/27/2020 - recommend adhesive bandage for Libre 2. 04/08/2020 - decrease entresto to 97/103 mg 1/2 tablet bid. Hold carvedilol. Continue to check bp and pulse bid. Increase Trulicity 3 mg weekly. Continue Farxiga 10 mg daily. Free style 2 CGM samples given.  02/14/2020 - decrease Entresto dose. Push fluids and check bp twice daily. Patient experienced hypotension/dizziness. Decrease Entresto 24/26 bid if SBP >100.  18/84/1660 - continue Trulicity and Farxiga 10 mg daily. Flu shot given.  63/02/6008 - start Trulicity 9.32 mg weekly x 2 weeks then increase to 1.5 mg weekly. Increase Lipitor 40 mg daily. Vascepa 1 gram 2 capslues twice daily. Clonazepam prn panic attacks. Increase tramadol 50 mg bid.  11/14/2019 - pfizer COVID. Bupropion XL 150 mg daily am then increase to 2 in the am.  Recent consult visits: 04/30/2020 - cardiology pharmd for bp management. Encouraged weight loss. Will consider toprol instead of carvedilol if sbp <!10 mmHg.  04/20/2020 - cardio - Entresto 24/26 mg bid and carvedilol 6.25 mg bid.  02/24/2020 - eye visit.  12/25/2019 - cardiology - continue lasix prn.   Hospital visits: None in previous 6 months  Objective:  Lab Results  Component Value Date   CREATININE 0.85 04/06/2020   BUN 16 04/06/2020   GFR 100.87 10/01/2014   GFRNONAA 75 04/06/2020   GFRAA 87 04/06/2020   NA 140 04/06/2020   K 4.7 04/06/2020   CALCIUM 9.5 04/06/2020   CO2 21 04/06/2020   GLUCOSE 148 (H) 04/06/2020    Lab Results  Component Value Date/Time   HGBA1C 7.3 (H) 04/06/2020 02:50 PM  HGBA1C 7.7 (H) 12/16/2019 09:38 AM   GFR 100.87 10/01/2014 10:54 AM   GFR 84.34 05/13/2014 02:32 PM   MICROALBUR Negative 09/03/2019 02:33 PM    Last diabetic Eye exam:  Lab Results  Component Value Date/Time   HMDIABEYEEXA No Retinopathy 02/24/2020 12:00 AM    Last diabetic Foot exam: No results found for: HMDIABFOOTEX   Lab Results  Component Value Date    CHOL 154 04/06/2020   HDL 42 04/06/2020   LDLCALC 83 04/06/2020   TRIG 170 (H) 04/06/2020   CHOLHDL 3.7 04/06/2020    Hepatic Function Latest Ref Rng & Units 04/06/2020 12/16/2019 09/04/2019  Total Protein 6.0 - 8.5 g/dL 6.2 6.5 6.2  Albumin 3.8 - 4.9 g/dL 4.0 4.2 3.9  AST 0 - 40 IU/L '20 21 15  ' ALT 0 - 32 IU/L 29 32 24  Alk Phosphatase 44 - 121 IU/L 192(H) 183(H) 196(H)  Total Bilirubin 0.0 - 1.2 mg/dL 0.6 0.5 0.6  Bilirubin, Direct 0.0 - 0.3 mg/dL - - -    Lab Results  Component Value Date/Time   TSH 3.070 04/06/2020 02:50 PM   TSH 2.020 07/17/2019 01:06 PM   FREET4 1.19 07/17/2019 01:06 PM    CBC Latest Ref Rng & Units 04/06/2020 12/16/2019 09/04/2019  WBC 3.4 - 10.8 x10E3/uL 7.9 7.9 7.1  Hemoglobin 11.1 - 15.9 g/dL 14.8 14.9 14.5  Hematocrit 34.0 - 46.6 % 45.1 46.0 45.1  Platelets 150 - 450 x10E3/uL 222 224 234    Lab Results  Component Value Date/Time   VD25OH 37.5 07/17/2019 01:06 PM    Clinical ASCVD: No  The 10-year ASCVD risk score Mikey Bussing DC Jr., et al., 2013) is: 5.1%   Values used to calculate the score:     Age: 61 years     Sex: Female     Is Non-Hispanic African American: No     Diabetic: Yes     Tobacco smoker: No     Systolic Blood Pressure: 737 mmHg     Is BP treated: Yes     HDL Cholesterol: 42 mg/dL     Total Cholesterol: 154 mg/dL    Depression screen [facility] 2/9 01/13/2020 12/18/2019 07/17/2019  Decreased Interest 2 0 3  Down, Depressed, Hopeless 2 2 -  PHQ - 2 Score '4 2 3  ' Altered sleeping 3 - 3  Tired, decreased energy '2 1 3  ' Change in appetite '2 1 3  ' Feeling bad or failure about yourself  '3 1 3  ' Trouble concentrating '3 1 3  ' Moving slowly or fidgety/restless 0 0 0  Suicidal thoughts 0 0 0  PHQ-9 Score 17 - 18  Difficult doing work/chores Somewhat difficult - Very difficult     Social History   Tobacco Use  Smoking Status Former Smoker  . Packs/day: 1.00  . Years: 15.00  . Pack years: 15.00  . Types: Cigarettes  . Quit date: 02/14/2005   . Years since quitting: 15.2  Smokeless Tobacco Never Used   BP Readings from Last 3 Encounters:  04/30/20 106/68  04/27/20 110/64  04/20/20 110/60   Pulse Readings from Last 3 Encounters:  04/30/20 77  04/27/20 100  04/20/20 93   Wt Readings from Last 3 Encounters:  04/27/20 285 lb (129.3 kg)  04/20/20 287 lb 3.2 oz (130.3 kg)  04/08/20 290 lb (131.5 kg)   BMI Readings from Last 3 Encounters:  04/27/20 46.00 kg/m  04/20/20 46.36 kg/m  04/08/20 46.81 kg/m    Assessment/Interventions: Review of  patient past medical history, allergies, medications, health status, including review of consultants reports, laboratory and other test data, was performed as part of comprehensive evaluation and provision of chronic care management services.   SDOH:  (Social Determinants of Health) assessments and interventions performed: Yes SDOH Interventions   Flowsheet Row Most Recent Value  SDOH Interventions   Physical Activity Interventions [facility] [employer]     SDOH Screenings   Alcohol Screen: Not on file  Depression (PHQ2-9): Medium Risk  . PHQ-2 Score: 17  Financial Resource Strain: Not on file  Food Insecurity: No Food Insecurity  . Worried About Charity fundraiser in the Last Year: Never true  . Ran Out of Food in the Last Year: Never true  Housing: Low Risk   . Last Housing Risk Score: 0  Physical Activity: Inactive  . Days of Exercise per Week: 0 days  . Minutes of Exercise per Session: 0 min  Social Connections: Not on file  Stress: Not on file  Tobacco Use: Medium Risk  . Smoking Tobacco Use: Former Smoker  . Smokeless Tobacco Use: Never Used  Transportation Needs: No Transportation Needs  . Lack of Transportation (Medical): No  . Lack of Transportation (Non-Medical): No    CCM Care Plan  Allergies  Allergen Reactions  . Demerol [Meperidine] Nausea And Vomiting    Medications Reviewed Today    Reviewed by Burnice Logan, [facility] (Pharmacist) on 05/13/20 at 1434  Med  List Status: <None>  Medication Order Taking? Sig Documenting Provider Last Dose Status Informant  albuterol (VENTOLIN HFA) 108 (90 Base) MCG/ACT inhaler [id number] No Inhale 2 puffs into the lungs every 6 (six) hours as needed for wheezing or shortness of breath.  Patient not taking: Reported on 05/13/2020   Rochel Brome, MD Not Taking Active   aspirin 81 MG tablet [id number] Yes Take 81 mg by mouth at bedtime.  [provider] Taking Active Self  atorvastatin (LIPITOR) 20 MG tablet [id number] Yes Take 20 mg by mouth daily. [provider] Taking Active   buPROPion (WELLBUTRIN XL) 300 MG 24 hr tablet [id number] Yes TAKE 1 TABLET EVERY DAY Cox, Kirsten, MD Taking Active   carvedilol (COREG) 6.25 MG tablet [id number] Yes Take 1 tablet (6.25 mg total) by mouth 2 (two) times daily. Sueanne Margarita, MD Taking Active   clonazePAM Bobbye [place]) 0.5 MG tablet [id number] Yes Take 1 tablet (0.5 mg total) by mouth 2 (two) times daily as needed for anxiety (panic attacks). Cox, Kirsten, MD Taking Active   Coenzyme Q10 (CO Q 10) 100 MG CAPS [id number] Yes Take 200 mg by mouth daily. [provider] Taking Active   diphenhydrAMINE (BENADRYL) 25 MG tablet [id number] Yes Take 25 mg by mouth at bedtime as needed. [provider] Taking Active   Dulaglutide (TRULICITY) 3 XH/3.7JI SOPN [id number] Yes Inject 3 mg as directed once a week. Cox, Kirsten, MD Taking Active   FARXIGA 10 MG TABS tablet [id number] Yes Take 1 tablet (10 mg total) by mouth daily. Cox, Kirsten, MD Taking Active   gabapentin (NEURONTIN) 400 MG capsule [id number] Yes TAKE 2 CAPSULES IN THE MORNING AND TAKE 2 CAPSULES IN THE Julio Alm, Kirsten, MD Taking Active   levothyroxine (SYNTHROID) 75 MCG tablet [id number] Yes TAKE 1 TABLET (75 MCG TOTAL) BY MOUTH DAILY. Marge Duncans, PA-C Taking Active   Lutein 20 MG TABS [id number] Yes Take 1 tablet by mouth daily. [provider] Taking Active   Multiple Vitamin (MULTIVITAMIN  WITH MINERALS) TABS  tablet [id number] Yes Take 1 tablet by mouth daily. [provider] Taking Active Self  OXYGEN [id number] Yes 2lpm 2 lpm as needed [provider] Taking Active   sacubitril-valsartan (ENTRESTO) 24-26 MG [id number] Yes Take 1 tablet by mouth 2 (two) times daily. Sueanne Margarita, MD Taking Active   Tiotropium Bromide-Olodaterol (STIOLTO RESPIMAT) 2.5-2.5 MCG/ACT AERS [id number] Yes INHALE 2 PUFFS INTO THE LUNGS DAILY. Tanda Rockers, MD Taking Active   tiZANidine (ZANAFLEX) 4 MG tablet [id number] Yes Take 1 tablet (4 mg total) by mouth every 6 (six) hours as needed for muscle spasms. Cox, Kirsten, MD Taking Active   traMADol Veatrice Bourbon) 50 MG tablet [id number] Yes Take 1 tablet (50 mg total) by mouth 2 (two) times daily. Rochel Brome, MD Taking Active   UNABLE TO FIND [id number] Yes CPAP with o2 2lpm  DME- [facility] [provider] Taking Active   VASCEPA 1 g capsule [id number] Yes TAKE 2 CAPSULES TWICE DAILY Cox, Kirsten, MD Taking Active   Vitamin D, Ergocalciferol, (DRISDOL) 1.25 MG (50000 UNIT) CAPS capsule [id number] Yes TAKE 1 CAPSULE EVERY 7 DAYS Cox, Kirsten, MD Taking Active           Patient Active Problem List   Diagnosis Date Noted  . Anxiety 09/03/2019  . Arthritis 09/03/2019  . Depression 09/03/2019  . Elevated BP 09/03/2019  . H/O scoliosis 09/03/2019  . History of migraine headaches 09/03/2019  . Neuropathy 09/03/2019  . Post-menopausal 09/03/2019  . Seasonal allergies 09/03/2019  . Vitamin D deficiency 09/03/2019  . Congestive heart failure ([place]) 09/03/2019  . Other specified hypothyroidism 09/03/2019  . Hyperlipidemia associated with type 2 diabetes mellitus ([place]) 07/30/2019  . OSA (obstructive sleep apnea)   . Leg swelling 10/09/2014  . Chronic systolic CHF (congestive heart failure), NYHA class 2 ([place]) 10/07/2014  . Obesity 08/23/2014  . DCM (dilated cardiomyopathy) ([place]) 04/25/2014  . COPD (chronic obstructive pulmonary disease)  ([place])   . Chronic respiratory failure with hypoxia ([place]) 04/10/2014  . CAP (community acquired pneumonia) 02/21/2014  . Cough 01/08/2014  . Essential hypertension 10/26/2013  . COPD GOLD  III  10/09/2013  . Pulmonary infiltrates only seen on CT chest 10/07/13  10/09/2013  . Dyspnea 10/08/2013  . Ganglion cyst 10/11/2012  . Other ganglion and cyst of synovium, tendon, and bursa(727.49) 09/20/2012  . Degenerative arthritis of hip 07/26/2012  . Foot pain, bilateral 05/31/2012  . Hip pain, right 05/31/2012  . Facet arthropathy, lumbar 03/08/2012  . 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 Long term current use of opiate analgesic 03/08/2012  . Pain syndrome, chronic 03/08/2012  . Knee pain 01/06/2010  . Mid back pain 01/06/2010  . Neck pain 01/06/2010  . Transfusion history 11/14/2006    Immunization History  Administered Date(s) Administered  . Influenza Inj Mdck Quad Pf 01/13/2020  . Influenza Split 10/15/2013, 11/24/2014  . Influenza-Unspecified 11/14/2012, 11/04/2018  . PFIZER(Purple Top)SARS-COV-2 Vaccination 05/09/2019, 06/03/2019, 11/14/2019  . Pneumococcal Polysaccharide-23 12/05/2013, 11/28/2014  . Pneumococcal-Unspecified 10/15/2013  . Tdap 09/30/2015    Conditions to be addressed/monitored:  Hypertension, Hyperlipidemia, Diabetes, COPD, Hypothyroidism, Depression, Anxiety, Osteoarthritis and vitamin d deficiency.   Care Plan : [place]  Updates made by Burnice Logan, RPH since 05/14/2020 12:00 AM    Problem: dm, hld, chf   Priority: High  Onset Date: 05/14/2020    Long-Range Goal: Patient-Specific Goal   Start Date: 05/14/2020  Expected End Date: 05/14/2021  This Visit's Progress: On track  Priority: High  Note:  Current Barriers:  . Unable to independently afford treatment regimen  Pharmacist Clinical Goal(s):  Marland Kitchen Patient will verbalize ability to afford treatment regimen through collaboration with PharmD and provider.   Interventions: . 1:1  collaboration with Rochel Brome, MD regarding development and update of comprehensive plan of care as evidenced by provider attestation and co-signature . Inter-disciplinary care team collaboration (see longitudinal plan of care) . Comprehensive medication review performed; medication list updated in electronic medical record  Hyperlipidemia: (LDL goal < 70) -Not ideally controlled -Current treatment: . vascepa 1 gram 2 capsules twice daily  . Atorvastatin 20 mg daily  . Aspirin 81 mg daily at bedtime  -Medications previously tried: none reported  -Current dietary patterns: cooks at home and watches salt intake -Current exercise habits: no formal exercise but has joined the [employer] and hopes to begin working out in the pool -Educated on Cholesterol goals;  Benefits of statin for ASCVD risk reduction; Importance of limiting foods high in cholesterol; Exercise goal of 150 minutes per week; -Counseled on diet and exercise extensively Recommended to continue current medication Assessed cost of Vascepa. Coordinated [employer]. [employer] cannot secondary bill Vascepa to [place] so pharmacist coordinating fill [facility]pa [facility].   Diabetes (A1c goal <7%) -Not ideally controlled -Current medications: . Trulicity 3 mg weekly  . Farxiga 10 mg daily  -Medications previously tried: none reported  -Current home glucose readings . fasting glucose: 106-124  . post prandial glucose: not checking  -Denies hypoglycemic/hyperglycemic symptoms -Current meal patterns:  . Patient reports cooking at home mainly. Works to avoid sodium and buys no salt added options. States that she probably doesn't eat as well as she should.  -Current exercise: no formal exercise currently but has joined the [employer] for water aerobics.  -Educated on A1c and blood sugar goals; Complications of diabetes including kidney damage, retinal damage, and cardiovascular disease; Exercise goal of 150 minutes per  week; Benefits of routine self-monitoring of blood sugar; -Counseled to check feet daily and get yearly eye exams -Counseled on diet and exercise extensively Recommended to continue current medication Collaborated with [employer] patient assistance to request Trulicity 3 mg weekly dose shipped to patient. Patient has had poor success with Libre 2 sample sticking to her arm. Pharmacist provided patch to assist with this.  Assessed patient's eligibility for [employer] patient assistance program.  Patient currently receives [place] from [place] and [place]. Discussed benefit for heart failure, kidney and blood sugar management.    Hypothyroid (Goal: manage TSH and symptoms of thyroid disease) -Controlled -Current treatment  . Levothyroxine 75 mcg daily  -Medications previously tried: none reported  -Recommended to continue current medication  Heart Failure (Goal: manage symptoms and prevent exacerbations) -Controlled -Last ejection fraction: 55% (Date: 06/2017) -HF type: Systolic -NYHA Class: II (slight limitation of activity) -Current treatment: . Entresto 24-26 mg bid . Carvedilol 6.25 mg bid  -Medications previously tried: none reported  -Current home BP/HR readings: 120-130/75-80 Pulse 70-80 -Current dietary habits: watches sodium and purchases no salt added products. Mainly cooks at home.  -Current exercise habits: no formal exercise currently. Has joined the [facility] and hopes to begin pool exercises.  -Educated on Benefits of medications for managing symptoms and prolonging life Importance of blood pressure control -Counseled on diet and exercise extensively Recommended to continue current medication Recommend continuing to monitor heart rate and blood pressure to avoid hypotension.   COPD (Goal: control symptoms and prevent exacerbations) -Controlled -Current treatment  . Albuterol inhaler 2 puffs into the lungs  eery 6 hours prn wheezing or shortness of breath . Stiolto 2 puffs daily   -Medications previously tried:  [place], symbicort  -Gold Grade: Gold 3 -Pulmonary function testing: 2015 fev1 54% -Exacerbations requiring treatment in last 6 months: none reported -Patient reports consistent use of maintenance inhaler -Frequency of rescue inhaler use: rarely but has one in case needed -Counseled on Benefits of consistent maintenance inhaler use Differences between maintenance and rescue inhalers -Recommended to continue current medication  Depression/Anxiety (Goal: manage symptoms of anxiety) -Controlled -Current treatment: . clonazepam 0.5 mg bid prn anxiety  . Bupropion XL 300 mg daily  -Medications previously tried/failed: none reported -PHQ9: 17 -GAD7: will assess during next visit  -Discussed benefits of exercise, cognitive behavior therapy, meditation or journaling for mental health support -Educated on Benefits of medication for symptom control Benefits of cognitive-behavioral therapy with or without medication -Counseled on diet and exercise extensively Recommended to continue current medication Educated on benefits of symptom management for overall health.   Chronic Back Pain (Goal: manage symptoms) -Controlled -Current treatment  . tizanidine 4 mg every 6 hours prn  . Tramadol 50 mg bid prn  . Gabapentin 400 mg 2 capsules am and 2 capsules pm  -Medications previously tried: none reported  -Recommended to continue current medication Counseled on benefits of pool exercise.   Health Maintenance -Vaccine gaps: fourth COVID recommended when available -Current therapy:  . multivitamin daily  . Vitamin d 50,000 weekly  . Coenzyme q10 200 mg daily  . Lutein 20 mg daily  -Educated on Cost vs benefit of each product must be carefully weighed by individual consumer -Patient is satisfied with current therapy and denies issues -Recommended to continue current medication   Patient Goals/Self-Care Activities . Patient will:  - take medications as  prescribed focus on medication adherence by using pill box check glucose daily, document, and provide at future appointments check blood pressure daily, document, and provide at future appointments target a minimum of 150 minutes of moderate intensity exercise weekly  Follow Up Plan: Telephone follow up appointment with care management team member scheduled for: 10/2020      Medication Assistance: Trulicity, Farxigaobtained through [place] and [place] and [place]. medication assistance program.  Enrollment ends 02/13/2021  Patient's preferred pharmacy is:  [place], [place] [address] Phone: [phone] Fax: [phone]  [id number] [place] [id number]: [phone]: [phone] Uses pill box? Yes - prepared 3 weeks of pill organizers at a time Pt endorses excellent compliance  We discussed: Current pharmacy is preferred with insurance plan and patient is satisfied with pharmacy services Patient decided to: Continue current medication management strategy  Care Plan and Follow Up Patient Decision:  Patient agrees to Care Plan and Follow-up.  Plan: Telephone follow up appointment with care management team member scheduled for:  10/2020

## 2020-05-12 ENCOUNTER — Telehealth: Payer: Self-pay

## 2020-05-12 NOTE — Progress Notes (Signed)
Chronic Care Management Pharmacy Assistant   Name: Judy [name]  MRN: [id number] DOB: 31-Oct-1960   Reason for Encounter: Initial questions for Lucia Gaskins, CPP    Medications: Outpatient Encounter Medications as of 05/12/2020  Medication Sig  . albuterol (VENTOLIN HFA) 108 (90 Base) MCG/ACT inhaler Inhale 2 puffs into the lungs every 6 (six) hours as needed for wheezing or shortness of breath.  Marland Kitchen aspirin 81 MG tablet Take 81 mg by mouth at bedtime.   Marland Kitchen atorvastatin (LIPITOR) 20 MG tablet Take 20 mg by mouth daily.  Marland Kitchen buPROPion (WELLBUTRIN XL) 300 MG 24 hr tablet TAKE 1 TABLET EVERY DAY  . carvedilol (COREG) 6.25 MG tablet Take 1 tablet (6.25 mg total) by mouth 2 (two) times daily.  . clonazePAM (KLONOPIN) 0.5 MG tablet Take 1 tablet (0.5 mg total) by mouth 2 (two) times daily as needed for anxiety (panic attacks).  . Coenzyme Q10 (CO Q 10) 100 MG CAPS Take 200 mg by mouth daily.  . Dulaglutide (TRULICITY) 3 MG/0.5ML SOPN Inject 3 mg as directed once a week.  Marland Kitchen FARXIGA 10 MG TABS tablet Take 1 tablet (10 mg total) by mouth daily.  Marland Kitchen gabapentin (NEURONTIN) 400 MG capsule TAKE 2 CAPSULES IN THE MORNING AND TAKE 2 CAPSULES IN THE EVENING  . levothyroxine (SYNTHROID) 75 MCG tablet TAKE 1 TABLET (75 MCG TOTAL) BY MOUTH DAILY.  Marland Kitchen Lutein 20 MG TABS Take 1 tablet by mouth daily.  . Multiple Vitamin (MULTIVITAMIN WITH MINERALS) TABS tablet Take 1 tablet by mouth daily.  . OXYGEN 2lpm 2 lpm as needed  . sacubitril-valsartan (ENTRESTO) 24-26 MG Take 1 tablet by mouth 2 (two) times daily.  . Tiotropium Bromide-Olodaterol (STIOLTO RESPIMAT) 2.5-2.5 MCG/ACT AERS INHALE 2 PUFFS INTO THE LUNGS DAILY.  Marland Kitchen tiZANidine (ZANAFLEX) 4 MG tablet Take 1 tablet (4 mg total) by mouth every 6 (six) hours as needed for muscle spasms.  . traMADol (ULTRAM) 50 MG tablet Take 1 tablet (50 mg total) by mouth 2 (two) times daily.  Marland Kitchen UNABLE TO FIND CPAP with o2 2lpm  DME- [facility]  . VASCEPA 1 g capsule TAKE 2 CAPSULES  TWICE DAILY  . Vitamin D, Ergocalciferol, (DRISDOL) 1.25 MG (50000 UNIT) CAPS capsule TAKE 1 CAPSULE EVERY 7 DAYS   No facility-administered encounter medications on file as of 05/12/2020.    Have you seen any other providers since your last visit?  04/30/20-Cardiology  Any changes in your medications or health?  3/17/22Dr Turner restarted carvedilol at 6.25mg  BID and decreased Entresto to 24-26mg  BID at last visit due to elevated HR and low/normal BP.-  Any side effects from any medications? Patient noted no certain side effects.  She did ask me to confirm her Atorvastatin.  I told her according to her chart it was 20mg  a day, but she should also confirm that with her doctor.  Do you have an symptoms or problems not managed by your medications? Patient stated she has some old hardware in her back and its difficult to do things.  Any concerns about your health right now? No specific concerns noted  Has your provider asked that you check blood pressure, blood sugar, or follow special diet at home? Patient sated she checks her blood pressure often, she only checks her blood sugar about once a week.  She does watch her salt intake, but does not watch her sugar intake.  Do you get any type of exercise on a regular basis? Patient stated her activity is  less to none due to her limited mobility issue.  Do you have any problems getting your medications? Patient stated she has no issues getting her medications.   Is there anything that you would like to discuss during the appointment? Patient could not think of anything at this time.  Patient knows to have medications near by.  Leilani Able, CMA Clinical Pharmacist Assistant [phone]

## 2020-05-13 ENCOUNTER — Telehealth: Payer: Self-pay

## 2020-05-13 ENCOUNTER — Ambulatory Visit (INDEPENDENT_AMBULATORY_CARE_PROVIDER_SITE_OTHER): Payer: Medicare HMO

## 2020-05-13 ENCOUNTER — Other Ambulatory Visit: Payer: Self-pay

## 2020-05-13 DIAGNOSIS — E7849 Other hyperlipidemia: Secondary | ICD-10-CM

## 2020-05-13 DIAGNOSIS — I10 Essential (primary) hypertension: Secondary | ICD-10-CM | POA: Diagnosis not present

## 2020-05-13 DIAGNOSIS — I5022 Chronic systolic (congestive) heart failure: Secondary | ICD-10-CM | POA: Diagnosis not present

## 2020-05-13 DIAGNOSIS — E1165 Type 2 diabetes mellitus with hyperglycemia: Secondary | ICD-10-CM | POA: Diagnosis not present

## 2020-05-13 DIAGNOSIS — E785 Hyperlipidemia, unspecified: Secondary | ICD-10-CM

## 2020-05-13 DIAGNOSIS — J410 Simple chronic bronchitis: Secondary | ICD-10-CM | POA: Diagnosis not present

## 2020-05-13 DIAGNOSIS — E1169 Type 2 diabetes mellitus with other specified complication: Secondary | ICD-10-CM

## 2020-05-13 MED ORDER — ICOSAPENT ETHYL 1 G PO CAPS: 2.0000 g | ORAL_CAPSULE | Freq: Two times a day (BID) | ORAL | 1 refills | Status: DC

## 2020-05-13 NOTE — Progress Notes (Signed)
    Chronic Care Management Pharmacy Assistant   Name: Judy [name]  MRN: [id number] DOB: 12/02/1960  Reason for Encounter: Medication Review for Trulicity PAP    Medications: Outpatient Encounter Medications as of 05/13/2020  Medication Sig  . albuterol (VENTOLIN HFA) 108 (90 Base) MCG/ACT inhaler Inhale 2 puffs into the lungs every 6 (six) hours as needed for wheezing or shortness of breath. (Patient not taking: Reported on 05/13/2020)  . aspirin 81 MG tablet Take 81 mg by mouth at bedtime.   Marland Kitchen atorvastatin (LIPITOR) 20 MG tablet Take 20 mg by mouth daily.  Marland Kitchen buPROPion (WELLBUTRIN XL) 300 MG 24 hr tablet TAKE 1 TABLET EVERY DAY  . carvedilol (COREG) 6.25 MG tablet Take 1 tablet (6.25 mg total) by mouth 2 (two) times daily.  . clonazePAM (KLONOPIN) 0.5 MG tablet Take 1 tablet (0.5 mg total) by mouth 2 (two) times daily as needed for anxiety (panic attacks).  . Coenzyme Q10 (CO Q 10) 100 MG CAPS Take 200 mg by mouth daily.  . diphenhydrAMINE (BENADRYL) 25 MG tablet Take 25 mg by mouth at bedtime as needed.  . Dulaglutide (TRULICITY) 3 MG/0.5ML SOPN Inject 3 mg as directed once a week.  Marland Kitchen FARXIGA 10 MG TABS tablet Take 1 tablet (10 mg total) by mouth daily.  Marland Kitchen gabapentin (NEURONTIN) 400 MG capsule TAKE 2 CAPSULES IN THE MORNING AND TAKE 2 CAPSULES IN THE EVENING  . icosapent Ethyl (VASCEPA) 1 g capsule Take 2 capsules (2 g total) by mouth 2 (two) times daily.  Marland Kitchen levothyroxine (SYNTHROID) 75 MCG tablet TAKE 1 TABLET (75 MCG TOTAL) BY MOUTH DAILY.  Marland Kitchen Lutein 20 MG TABS Take 1 tablet by mouth daily.  . Multiple Vitamin (MULTIVITAMIN WITH MINERALS) TABS tablet Take 1 tablet by mouth daily.  . OXYGEN 2lpm 2 lpm as needed  . sacubitril-valsartan (ENTRESTO) 24-26 MG Take 1 tablet by mouth 2 (two) times daily.  . Tiotropium Bromide-Olodaterol (STIOLTO RESPIMAT) 2.5-2.5 MCG/ACT AERS INHALE 2 PUFFS INTO THE LUNGS DAILY.  Marland Kitchen tiZANidine (ZANAFLEX) 4 MG tablet Take 1 tablet (4 mg total) by mouth every  6 (six) hours as needed for muscle spasms.  . traMADol (ULTRAM) 50 MG tablet Take 1 tablet (50 mg total) by mouth 2 (two) times daily.  Marland Kitchen UNABLE TO FIND CPAP with o2 2lpm  DME- [facility]  . Vitamin D, Ergocalciferol, (DRISDOL) 1.25 MG (50000 UNIT) CAPS capsule TAKE 1 CAPSULE EVERY 7 DAYS   No facility-administered encounter medications on file as of 05/13/2020.    Lucia Gaskins, CPP asked me to call and check on the status of her Trulicity PAP application. The patient will be out of medication on 05/20/20.  I spoke to the representative and she stated they needed clarification as to the MG dose on page 5.  They do not offer vouchers or emergency coupons for medication.  I notified Lucia Gaskins, CPP and she was going to resend the information, and asked me to check back on 05/14/20  05/14/20- called and representative was going to process her application while I was talking with her, the patient was approved and will need to call their pharmacy at [phone] to schedule her shipment.  I have notified the patient.  Leilani Able, CMA Clinical Pharmacist Assistant [phone]

## 2020-05-14 NOTE — Patient Instructions (Addendum)
Visit Information  Thank you for your time discussing your medications. I look forward to working with you to achieve your health care goals. Below is a summary of what we talked about during our visit.   Goals Addressed            This Visit's Progress   . Learn More About My Health       Timeframe:  Long-Range Goal Priority:  High Start Date:                             Expected End Date:                        Follow Up Date 10/28/2020    - ask questions - bring a list of my medicines to the visit - speak up when I don't understand    Why is this important?    The best way to learn about your health and care is by talking to the doctor and nurse.   They will answer your questions and give you information in the way that you like best.    Notes:     Marland Kitchen Manage My Medicine       Timeframe:  Long-Range Goal Priority:  High Start Date:                             Expected End Date:                       Follow Up Date 10/28/2020    - call for medicine refill 2 or 3 days before it runs out - keep a list of all the medicines I take; vitamins and herbals too - use a pillbox to sort medicine    Why is this important?   . These steps will help you keep on track with your medicines.   Notes:     . Track and Manage Fluids and Swelling-Heart Failure       Timeframe:  Long-Range Goal Priority:  High Start Date:                             Expected End Date:                       Follow Up Date 10/28/2020    - use salt in moderation - watch for swelling in feet, ankles and legs every day    Why is this important?    It is important to check your weight daily and watch how much salt and liquids you have.   It will help you to manage your heart failure.    Notes:        Patient Care Plan: CCM Pharmacy Care Plan    Problem Identified: dm, hld, chf   Priority: High  Onset Date: 05/14/2020    Long-Range Goal: Patient-Specific Goal   Start Date: 05/14/2020   Expected End Date: 05/14/2021  This Visit's Progress: On track  Priority: High  Note:    Current Barriers:  . Unable to independently afford treatment regimen  Pharmacist Clinical Goal(s):  Marland Kitchen Patient will verbalize ability to afford treatment regimen through collaboration with PharmD and provider.   Interventions: . 1:1 collaboration with Rochel Brome, MD regarding development and update  of comprehensive plan of care as evidenced by provider attestation and co-signature . Inter-disciplinary care team collaboration (see longitudinal plan of care) . Comprehensive medication review performed; medication list updated in electronic medical record  Hyperlipidemia: (LDL goal < 70) -Not ideally controlled -Current treatment: . vascepa 1 gram 2 capsules twice daily  . Atorvastatin 20 mg daily  . Aspirin 81 mg daily at bedtime  -Medications previously tried: none reported  -Current dietary patterns: cooks at home and watches salt intake -Current exercise habits: no formal exercise but has joined the [employer] and hopes to begin working out in the pool -Educated on Cholesterol goals;  Benefits of statin for ASCVD risk reduction; Importance of limiting foods high in cholesterol; Exercise goal of 150 minutes per week; -Counseled on diet and exercise extensively Recommended to continue current medication Assessed cost of Vascepa. Coordinated [employer]. [employer] cannot secondary bill Vascepa to [place] so pharmacist coordinating fill for Vascepa through [facility].   Diabetes (A1c goal <7%) -Not ideally controlled -Current medications: . Trulicity 3 mg weekly  . Farxiga 10 mg daily  -Medications previously tried: none reported  -Current home glucose readings . fasting glucose: 106-124  . post prandial glucose: not checking  -Denies hypoglycemic/hyperglycemic symptoms -Current meal patterns:  . Patient reports cooking at home mainly. Works to avoid sodium and buys no salt added options.  States that she probably doesn't eat as well as she should.  -Current exercise: no formal exercise currently but has joined the [employer] for water aerobics.  -Educated on A1c and blood sugar goals; Complications of diabetes including kidney damage, retinal damage, and cardiovascular disease; Exercise goal of 150 minutes per week; Benefits of routine self-monitoring of blood sugar; -Counseled to check feet daily and get yearly eye exams -Counseled on diet and exercise extensively Recommended to continue current medication Collaborated with [employer] patient assistance to request Trulicity 3 mg weekly dose shipped to patient. Patient has had poor success with Libre 2 sample sticking to her arm. Pharmacist provided patch to assist with this.  Assessed patient's eligibility for [employer] patient assistance program.  Patient currently receives Shields from [place] and [place]. Discussed benefit for heart failure, kidney and blood sugar management.    Hypothyroid (Goal: manage TSH and symptoms of thyroid disease) -Controlled -Current treatment  . Levothyroxine 75 mcg daily  -Medications previously tried: none reported  -Recommended to continue current medication  Heart Failure (Goal: manage symptoms and prevent exacerbations) -Controlled -Last ejection fraction: 55% (Date: 06/2017) -HF type: Systolic -NYHA Class: II (slight limitation of activity) -Current treatment: . Entresto 24-26 mg bid . Carvedilol 6.25 mg bid  -Medications previously tried: none reported  -Current home BP/HR readings: 120-130/75-80 Pulse 70-80 -Current dietary habits: watches sodium and purchases no salt added products. Mainly cooks at home.  -Current exercise habits: no formal exercise currently. Has joined the [facility] and hopes to begin pool exercises.  -Educated on Benefits of medications for managing symptoms and prolonging life Importance of blood pressure control -Counseled on diet and exercise extensively Recommended to continue  current medication Recommend continuing to monitor heart rate and blood pressure to avoid hypotension.   COPD (Goal: control symptoms and prevent exacerbations) -Controlled -Current treatment  . Albuterol inhaler 2 puffs into the lungs eery 6 hours prn wheezing or shortness of breath . Stiolto 2 puffs daily  -Medications previously tried:  [place], symbicort  -Gold Grade: Gold 3 -Pulmonary function testing: 2015 fev1 54% -Exacerbations requiring treatment in last 6 months: none reported -Patient reports consistent  use of maintenance inhaler -Frequency of rescue inhaler use: rarely but has one in case needed -Counseled on Benefits of consistent maintenance inhaler use Differences between maintenance and rescue inhalers -Recommended to continue current medication  Depression/Anxiety (Goal: manage symptoms of anxiety) -Controlled -Current treatment: . clonazepam 0.5 mg bid prn anxiety  . Bupropion XL 300 mg daily  -Medications previously tried/failed: none reported -PHQ9: 17 -GAD7: will assess during next visit  -Discussed benefits of exercise, cognitive behavior therapy, meditation or journaling for mental health support -Educated on Benefits of medication for symptom control Benefits of cognitive-behavioral therapy with or without medication -Counseled on diet and exercise extensively Recommended to continue current medication Educated on benefits of symptom management for overall health.   Chronic Back Pain (Goal: manage symptoms) -Controlled -Current treatment  . tizanidine 4 mg every 6 hours prn  . Tramadol 50 mg bid prn  . Gabapentin 400 mg 2 capsules am and 2 capsules pm  -Medications previously tried: none reported  -Recommended to continue current medication Counseled on benefits of pool exercise.   Health Maintenance -Vaccine gaps: fourth COVID recommended when available -Current therapy:  . multivitamin daily  . Vitamin d 50,000 weekly  . Coenzyme q10 200 mg  daily  . Lutein 20 mg daily  -Educated on Cost vs benefit of each product must be carefully weighed by individual consumer -Patient is satisfied with current therapy and denies issues -Recommended to continue current medication   Patient Goals/Self-Care Activities . Patient will:  - take medications as prescribed focus on medication adherence by using pill box check glucose daily, document, and provide at future appointments check blood pressure daily, document, and provide at future appointments target a minimum of 150 minutes of moderate intensity exercise weekly  Follow Up Plan: Telephone follow up appointment with care management team member scheduled for: 10/2020      Ms. Youtz was given information about Chronic Care Management services today including:  1. CCM service includes personalized support from designated clinical staff supervised by her physician, including individualized plan of care and coordination with other care providers 2. 24/7 contact phone numbers for assistance for urgent and routine care needs. 3. Standard insurance, coinsurance, copays and deductibles apply for chronic care management only during months in which we provide at least 20 minutes of these services. Most insurances cover these services at 100%, however patients may be responsible for any copay, coinsurance and/or deductible if applicable. This service may help you avoid the need for more expensive face-to-face services. 4. Only one practitioner may furnish and bill the service in a calendar month. 5. The patient may stop CCM services at any time (effective at the end of the month) by phone call to the office staff.  Patient agreed to services and verbal consent obtained.   Patient verbalizes understanding of instructions provided today and agrees to view in MyChart.  Telephone follow up appointment with pharmacy team member scheduled for: 10/2020  Juliane Lack, PharmD Clinical  Pharmacist [facility] [phone] (office) [phone] (mobile)  Exercises to do While Sitting  Exercises that you do while sitting (chair exercises) can give you many of the same benefits as full exercise. Benefits include strengthening your heart, burning calories, and keeping muscles and joints healthy. Exercise can also improve your mood and help with depression and anxiety. You may benefit from chair exercises if you are unable to do standing exercises because of:  Diabetic foot pain.  Obesity.  Illness.  Arthritis.  Recovery from  surgery or injury.  Breathing problems.  Balance problems.  Another type of disability. Before starting chair exercises, check with your health care provider or a physical therapist to find out how much exercise you can tolerate and which exercises are safe for you. If your health care provider approves:  Start out slowly and build up over time. Aim to work up to about 10-20 minutes for each exercise session.  Make exercise part of your daily routine.  Drink water when you exercise. Do not wait until you are thirsty. Drink every 10-15 minutes.  Stop exercising right away if you have pain, nausea, shortness of breath, or dizziness.  If you are exercising in a wheelchair, make sure to lock the wheels.  Ask your health care provider whether you can do tai chi or yoga. Many positions in these mind-body exercises can be modified to do while seated. Warm-up Before starting other exercises: 1. Sit up as straight as you can. Have your knees bent at 90 degrees, which is the shape of the capital letter "L." Keep your feet flat on the floor. 2. Sit at the front edge of your chair, if you can. 3. Pull in (tighten) the muscles in your abdomen and stretch your spine and neck as straight as you can. Hold this position for a few minutes. 4. Breathe in and out evenly. Try to concentrate on your breathing, and relax your mind. Stretching Exercise  A: Arm stretch 1. Hold your arms out straight in front of your body. 2. Bend your hands at the wrist with your fingers pointing up, as if signaling someone to stop. Notice the slight tension in your forearms as you hold the position. 3. Keeping your arms out and your hands bent, rotate your hands outward as far as you can and hold this stretch. Aim to have your thumbs pointing up and your pinkie fingers pointing down. Slowly repeat arm stretches for one minute as tolerated. Exercise B: Leg stretch 1. If you can move your legs, try to "draw" letters on the floor with the toes of your foot. Write your name with one foot. 2. Write your name with the toes of your other foot. Slowly repeat the movements for one minute as tolerated. Exercise C: Reach for the sky 1. Reach your hands as far over your head as you can to stretch your spine. 2. Move your hands and arms as if you are climbing a rope. Slowly repeat the movements for one minute as tolerated. Range of motion exercises Exercise A: Shoulder roll 1. Let your arms hang loosely at your sides. 2. Lift just your shoulders up toward your ears, then let them relax back down. 3. When your shoulders feel loose, rotate your shoulders in backward and forward circles. Do shoulder rolls slowly for one minute as tolerated. Exercise B: March in place 1. As if you are marching, pump your arms and lift your legs up and down. Lift your knees as high as you can. ? If you are unable to lift your knees, just pump your arms and move your ankles and feet up and down. March in place for one minute as tolerated. Exercise C: Seated jumping jacks 1. Let your arms hang down straight. 2. Keeping your arms straight, lift them up over your head. Aim to point your fingers to the ceiling. 3. While you lift your arms, straighten your legs and slide your heels along the floor to your sides, as wide as you can. 4. As you  bring your arms back down to your sides, slide your  legs back together. ? If you are unable to use your legs, just move your arms. Slowly repeat seated jumping jacks for one minute as tolerated. Strengthening exercises Exercise A: Shoulder squeeze 1. Hold your arms straight out from your body to your sides, with your elbows bent and your fists pointed at the ceiling. 2. Keeping your arms in the bent position, move them forward so your elbows and forearms meet in front of your face. 3. Open your arms back out as wide as you can with your elbows still bent, until you feel your shoulder blades squeezing together. Hold for 5 seconds. Slowly repeat the movements forward and backward for one minute as tolerated. Contact a health care provider if you:  Had to stop exercising due to any of the following: ? Pain. ? Nausea. ? Shortness of breath. ? Dizziness. ? Fatigue.  Have significant pain or soreness after exercising. Get help right away if you have:  Chest pain.  Difficulty breathing. These symptoms may represent a serious problem that is an emergency. Do not wait to see if the symptoms will go away. Get medical help right away. Call your local emergency services (911 in the [place] Do not drive yourself to the hospital. This information is not intended to replace advice given to you by your health care provider. Make sure you discuss any questions you have with your health care provider. Document Revised: 05/30/2019 Document Reviewed: 05/30/2019 Elsevier Patient Education  2021 [employer].

## 2020-05-18 ENCOUNTER — Telehealth: Payer: Self-pay

## 2020-05-18 NOTE — Progress Notes (Signed)
Chronic Care Management Pharmacy Assistant   Name: Judy [name]  MRN: [id number] DOB: 12-Sep-1960   Reason for Encounter: Medication Review for Entresto PAP    Medications: Outpatient Encounter Medications as of 05/18/2020  Medication Sig  . albuterol (VENTOLIN HFA) 108 (90 Base) MCG/ACT inhaler Inhale 2 puffs into the lungs every 6 (six) hours as needed for wheezing or shortness of breath. (Patient not taking: Reported on 05/13/2020)  . aspirin 81 MG tablet Take 81 mg by mouth at bedtime.   Marland Kitchen atorvastatin (LIPITOR) 20 MG tablet Take 20 mg by mouth daily.  Marland Kitchen buPROPion (WELLBUTRIN XL) 300 MG 24 hr tablet TAKE 1 TABLET EVERY DAY  . carvedilol (COREG) 6.25 MG tablet Take 1 tablet (6.25 mg total) by mouth 2 (two) times daily.  . clonazePAM (KLONOPIN) 0.5 MG tablet Take 1 tablet (0.5 mg total) by mouth 2 (two) times daily as needed for anxiety (panic attacks).  . Coenzyme Q10 (CO Q 10) 100 MG CAPS Take 200 mg by mouth daily.  . diphenhydrAMINE (BENADRYL) 25 MG tablet Take 25 mg by mouth at bedtime as needed.  . Dulaglutide (TRULICITY) 3 MG/0.5ML SOPN Inject 3 mg as directed once a week.  Marland Kitchen FARXIGA 10 MG TABS tablet Take 1 tablet (10 mg total) by mouth daily.  Marland Kitchen gabapentin (NEURONTIN) 400 MG capsule TAKE 2 CAPSULES IN THE MORNING AND TAKE 2 CAPSULES IN THE EVENING  . icosapent Ethyl (VASCEPA) 1 g capsule Take 2 capsules (2 g total) by mouth 2 (two) times daily.  Marland Kitchen levothyroxine (SYNTHROID) 75 MCG tablet TAKE 1 TABLET (75 MCG TOTAL) BY MOUTH DAILY.  Marland Kitchen Lutein 20 MG TABS Take 1 tablet by mouth daily.  . Multiple Vitamin (MULTIVITAMIN WITH MINERALS) TABS tablet Take 1 tablet by mouth daily.  . OXYGEN 2lpm 2 lpm as needed  . sacubitril-valsartan (ENTRESTO) 24-26 MG Take 1 tablet by mouth 2 (two) times daily.  . Tiotropium Bromide-Olodaterol (STIOLTO RESPIMAT) 2.5-2.5 MCG/ACT AERS INHALE 2 PUFFS INTO THE LUNGS DAILY.  Marland Kitchen tiZANidine (ZANAFLEX) 4 MG tablet Take 1 tablet (4 mg total) by mouth every  6 (six) hours as needed for muscle spasms.  . traMADol (ULTRAM) 50 MG tablet Take 1 tablet (50 mg total) by mouth 2 (two) times daily.  Marland Kitchen UNABLE TO FIND CPAP with o2 2lpm  DME- [facility]  . Vitamin D, Ergocalciferol, (DRISDOL) 1.25 MG (50000 UNIT) CAPS capsule TAKE 1 CAPSULE EVERY 7 DAYS   No facility-administered encounter medications on file as of 05/18/2020.    05/18/20-Called [employer] to check on application status for [employer].  The representative said application was received, it is currently waiting on benefits processing which can take 3-5 days.  I have called [facility] to check on 90ds refill of Vascepa status  05/20/20- called about Entresto PAP status, per representative it is still in process, she said to check back on 05/22/20.  05/20/20- Spoke to patient to let her know her application is in process.  I asked her about her Vascepa refill, she stated the pharmacy had called her yesterday that it was being filled, and would let her know when it was ready to pick up.  05/22/20- Called for Entresto status, representative stated it is still in process, she said call back on Wednesday 05/27/20.  I asked if they needed something additional from [place], she looked at the application and said no.    05/26/20-Called for Entresto status, representative said it is still in process for benefits review, she said  they are backed and to check back next week.  06/05/20-Called for Entresto, representative said it is still in review, she is going to send an email out as she stated they should have already had an answer, not sure why this is taking so long.  She said to check back next week.    06/09/20  I called the patient to update her on the status of her application, and to let her know I was going to call again today.  She stated she got a text message that she had been approved.  I let her know it take up to 14 days to receive her medication and to let us know if she needed anything.    I have updated Lucia Gaskins, CPP as  to the status  Leilani Able, [facility] Clinical Pharmacist Assistant [phone]

## 2020-05-19 ENCOUNTER — Other Ambulatory Visit: Payer: Self-pay

## 2020-05-19 ENCOUNTER — Ambulatory Visit (INDEPENDENT_AMBULATORY_CARE_PROVIDER_SITE_OTHER): Payer: Medicare HMO | Admitting: Nurse Practitioner

## 2020-05-19 ENCOUNTER — Encounter: Payer: Self-pay | Admitting: Nurse Practitioner

## 2020-05-19 VITALS — BP 110/84 | HR 78 | Temp 97.5°F | Resp 20 | Ht 65.0 in | Wt 286.0 lb

## 2020-05-19 DIAGNOSIS — N3001 Acute cystitis with hematuria: Secondary | ICD-10-CM

## 2020-05-19 DIAGNOSIS — R310 Gross hematuria: Secondary | ICD-10-CM | POA: Diagnosis not present

## 2020-05-19 DIAGNOSIS — M47816 Spondylosis without myelopathy or radiculopathy, lumbar region: Secondary | ICD-10-CM | POA: Diagnosis not present

## 2020-05-19 LAB — POCT URINALYSIS DIPSTICK
Bilirubin, UA: NEGATIVE
Glucose, UA: POSITIVE — AB
Ketones, UA: NEGATIVE
Nitrite, UA: NEGATIVE
Protein, UA: NEGATIVE
Spec Grav, UA: 1.025 (ref 1.010–1.025)
Urobilinogen, UA: 0.2 E.U./dL
pH, UA: 5 (ref 5.0–8.0)

## 2020-05-19 MED ORDER — SULFAMETHOXAZOLE-TRIMETHOPRIM 800-160 MG PO TABS: 1.0000 | ORAL_TABLET | Freq: Two times a day (BID) | ORAL | 0 refills | Status: DC

## 2020-05-19 NOTE — Progress Notes (Signed)
Acute Office Visit  Subjective:    Patient ID: Judy [name], female    DOB: January 08, 1961, 60 y.o.   MRN: [id number]  CC: Hematuria   HPI Patient is in today for gross hematuria. Onset was today. Denies treatment at home. She states after she noticed hematuria she did notice slight dysuria and urgency. She has chronic back pain. Denies fever, chills, or abd pain. Denies past history of hematuria or renal stones. Denies falling or trauma to kidney.    Past Medical History:  Diagnosis Date  . Anxiety   . Back pain   . Bilateral swelling of feet   . Chronic combined systolic and diastolic CHF (congestive heart failure) (HCC) 10/07/2014  . COPD (chronic obstructive pulmonary disease) (HCC)   . DCM (dilated cardiomyopathy) (HCC) 04/25/2014   EF 28% by MRI despite maximum medical therapy  . Depression   . Diabetes mellitus without complication (HCC)   . Epilepsy (HCC)    as a child  . Fatty liver   . Former tobacco use   . Hyperlipidemia   . Hypertension   . Hypothyroidism   . Joint pain   . Leg swelling   . Morbid obesity (HCC)   . NICM (nonischemic cardiomyopathy) (HCC)   . OSA (obstructive sleep apnea)    moderate with AHI 26/hr with oxygen desaturations as low as 70%  . Scoliosis   . Sleep apnea   . SOB (shortness of breath)     Past Surgical History:  Procedure Laterality Date  . ABDOMINAL HYSTERECTOMY    . ankle artery involved cyst removal    . CARDIAC CATHETERIZATION  05/14/2014   normal coronary arteries  . CARPAL TUNNEL RELEASE    . FASCIOTOMY     1993 for platar fasciitis  . harrington rod scoliosis     1981  . LEFT HEART CATHETERIZATION WITH CORONARY ANGIOGRAM N/A 05/14/2014   Procedure: LEFT HEART CATHETERIZATION WITH CORONARY ANGIOGRAM;  Surgeon: Iran Ouch, MD;  Location: [facility] CATH LAB;  Service: Cardiovascular;  Laterality: N/A;  . ROTATOR CUFF REPAIR    . UMBILICAL HERNIA REPAIR    . VESICOVAGINAL FISTULA CLOSURE W/ TAH      Family History   Problem Relation Age of Onset  . Emphysema Father   . Allergies Father   . Heart failure Father   . Heart disease Father 11       MI  . Diabetes Father   . Hyperlipidemia Father   . Hypertension Father   . Thyroid disease Father   . Alcohol abuse Father   . Obesity Father   . Diabetes Mother   . Hyperlipidemia Mother   . Obesity Mother     Social History   Socioeconomic History  . Marital status: Married    Spouse name: Not on file  . Number of children: Not on file  . Years of education: Not on file  . Highest education level: Not on file  Occupational History  . Occupation: disabled  Tobacco Use  . Smoking status: Former Smoker    Packs/day: 1.00    Years: 15.00    Pack years: 15.00    Types: Cigarettes    Quit date: 02/14/2005    Years since quitting: 15.2  . Smokeless tobacco: Never Used  Vaping Use  . Vaping Use: Never used  Substance and Sexual Activity  . Alcohol use: Yes    Comment: occ  . Drug use: No  . Sexual activity: Not on  file  Other Topics Concern  . Not on file  Social History Narrative   Lives in [place] with spouse and son.   Previously worked as a Comptroller         Social Determinants of Community education officer: Not on file  Food Insecurity: No Food Insecurity  . Worried About Programme researcher, broadcasting/film/video in the Last Year: Never true  . Ran Out of Food in the Last Year: Never true  Transportation Needs: No Transportation Needs  . Lack of Transportation (Medical): No  . Lack of Transportation (Non-Medical): No  Physical Activity: Inactive  . Days of Exercise per Week: 0 days  . Minutes of Exercise per Session: 0 min  Stress: Not on file  Social Connections: Not on file  Intimate Partner Violence: Not on file    Outpatient Medications Prior to Visit  Medication Sig Dispense Refill  . albuterol (VENTOLIN HFA) 108 (90 Base) MCG/ACT inhaler Inhale 2 puffs into the lungs every 6 (six) hours as needed for  wheezing or shortness of breath. (Patient not taking: Reported on 05/13/2020) 8 g 2  . aspirin 81 MG tablet Take 81 mg by mouth at bedtime.     Marland Kitchen atorvastatin (LIPITOR) 20 MG tablet Take 20 mg by mouth daily.    Marland Kitchen buPROPion (WELLBUTRIN XL) 300 MG 24 hr tablet TAKE 1 TABLET EVERY DAY 90 tablet 3  . carvedilol (COREG) 6.25 MG tablet Take 1 tablet (6.25 mg total) by mouth 2 (two) times daily. 180 tablet 3  . clonazePAM (KLONOPIN) 0.5 MG tablet Take 1 tablet (0.5 mg total) by mouth 2 (two) times daily as needed for anxiety (panic attacks). 180 tablet 0  . Coenzyme Q10 (CO Q 10) 100 MG CAPS Take 200 mg by mouth daily.    . diphenhydrAMINE (BENADRYL) 25 MG tablet Take 25 mg by mouth at bedtime as needed.    . Dulaglutide (TRULICITY) 3 MG/0.5ML SOPN Inject 3 mg as directed once a week. 2 mL 0  . FARXIGA 10 MG TABS tablet Take 1 tablet (10 mg total) by mouth daily. 90 tablet 3  . gabapentin (NEURONTIN) 400 MG capsule TAKE 2 CAPSULES IN THE MORNING AND TAKE 2 CAPSULES IN THE EVENING 360 capsule 3  . icosapent Ethyl (VASCEPA) 1 g capsule Take 2 capsules (2 g total) by mouth 2 (two) times daily. 360 capsule 1  . levothyroxine (SYNTHROID) 75 MCG tablet TAKE 1 TABLET (75 MCG TOTAL) BY MOUTH DAILY. 90 tablet 1  . Lutein 20 MG TABS Take 1 tablet by mouth daily.    . Multiple Vitamin (MULTIVITAMIN WITH MINERALS) TABS tablet Take 1 tablet by mouth daily.    . OXYGEN 2lpm 2 lpm as needed    . sacubitril-valsartan (ENTRESTO) 24-26 MG Take 1 tablet by mouth 2 (two) times daily. 180 tablet 3  . Tiotropium Bromide-Olodaterol (STIOLTO RESPIMAT) 2.5-2.5 MCG/ACT AERS INHALE 2 PUFFS INTO THE LUNGS DAILY. 12 g 5  . tiZANidine (ZANAFLEX) 4 MG tablet Take 1 tablet (4 mg total) by mouth every 6 (six) hours as needed for muscle spasms. 360 tablet 1  . traMADol (ULTRAM) 50 MG tablet Take 1 tablet (50 mg total) by mouth 2 (two) times daily. 60 tablet 2  . UNABLE TO FIND CPAP with o2 2lpm  DME- [facility]    . Vitamin D,  Ergocalciferol, (DRISDOL) 1.25 MG (50000 UNIT) CAPS capsule TAKE 1 CAPSULE EVERY 7 DAYS 12 capsule 3   No facility-administered medications  prior to visit.    Allergies  Allergen Reactions  . Demerol [Meperidine] Nausea And Vomiting    Review of Systems  Constitutional: Negative for appetite change, fatigue and unexpected weight change.  HENT: Negative for congestion, ear pain, rhinorrhea, sinus pressure, sinus pain and tinnitus.   Eyes: Negative for pain.  Respiratory: Negative for cough and shortness of breath.   Cardiovascular: Negative for chest pain, palpitations and leg swelling.  Gastrointestinal: Negative for abdominal pain, constipation, diarrhea, nausea and vomiting.  Endocrine: Negative for cold intolerance, heat intolerance, polydipsia, polyphagia and polyuria.  Genitourinary: Positive for dysuria, hematuria and urgency. Negative for frequency.  Musculoskeletal: Negative for arthralgias, back pain, joint swelling and myalgias.  Skin: Negative for rash.  Allergic/Immunologic: Negative for environmental allergies.  Neurological: Negative for dizziness and headaches.  Hematological: Negative for adenopathy.  Psychiatric/Behavioral: Negative for decreased concentration and sleep disturbance. The patient is not nervous/anxious.        Objective:    Physical Exam Vitals reviewed.  Constitutional:      Appearance: Normal appearance.  HENT:     Head: Normocephalic.  Cardiovascular:     Rate and Rhythm: Normal rate and regular rhythm.     Pulses: Normal pulses.     Heart sounds: Normal heart sounds.  Pulmonary:     Effort: Pulmonary effort is normal.     Breath sounds: Normal breath sounds.  Abdominal:     General: Bowel sounds are normal.     Palpations: Abdomen is soft.     Tenderness: There is abdominal tenderness (mild to suprapubic area). There is no right CVA tenderness or left CVA tenderness.  Skin:    General: Skin is warm and dry.     Capillary Refill:  Capillary refill takes less than 2 seconds.  Neurological:     General: No focal deficit present.     Mental Status: She is alert and oriented to person, place, and time.  Psychiatric:        Mood and Affect: Mood normal.        Behavior: Behavior normal.     BP 110/84   Pulse 78   Temp (!) 97.5 F (36.4 C)   Resp 20   Ht 5\' 5"  (1.651 m)   Wt 286 lb (129.7 kg)   BMI 47.59 kg/m  Wt Readings from Last 3 Encounters:  05/19/20 286 lb (129.7 kg)  04/27/20 285 lb (129.3 kg)  04/20/20 287 lb 3.2 oz (130.3 kg)    Lab Results  Component Value Date   TSH 3.070 04/06/2020   Lab Results  Component Value Date   WBC 7.9 04/06/2020   HGB 14.8 04/06/2020   HCT 45.1 04/06/2020   MCV 92 04/06/2020   PLT 222 04/06/2020   Lab Results  Component Value Date   NA 140 04/06/2020   K 4.7 04/06/2020   CO2 21 04/06/2020   GLUCOSE 148 (H) 04/06/2020   BUN 16 04/06/2020   CREATININE 0.85 04/06/2020   BILITOT 0.6 04/06/2020   ALKPHOS 192 (H) 04/06/2020   AST 20 04/06/2020   ALT 29 04/06/2020   PROT 6.2 04/06/2020   ALBUMIN 4.0 04/06/2020   CALCIUM 9.5 04/06/2020   ANIONGAP 13 09/24/2018   GFR 100.87 10/01/2014   Lab Results  Component Value Date   CHOL 154 04/06/2020   Lab Results  Component Value Date   HDL 42 04/06/2020   Lab Results  Component Value Date   LDLCALC 83 04/06/2020   Lab Results  Component Value Date   TRIG 170 (H) 04/06/2020   Lab Results  Component Value Date   CHOLHDL 3.7 04/06/2020   Lab Results  Component Value Date   HGBA1C 7.3 (H) 04/06/2020       Assessment & Plan:   1. Gross hematuria - POCT urinalysis dipstick - Urine Culture - CBC with Differential/Platelet - Comprehensive metabolic panel  2. Acute cystitis with hematuria - CBC with Differential/Platelet - sulfamethoxazole-trimethoprim (BACTRIM DS) 800-160 MG tablet; Take 1 tablet by mouth 2 (two) times daily.  Dispense: 14 tablet; Refill: 0    Take Bactrim DS twice daily for  7 days Increase fluid intake Seek emergency medical care for concerning symptoms Obtain kidney x-ray at [facility] bar soap for bathing We will call you with lab and x-ray results Follow-up as needed    Follow-up: As needed   Signed, Janie Morning, NP

## 2020-05-19 NOTE — Patient Instructions (Addendum)
Take Bactrim DS twice daily for 7 days Increase fluid intake Seek emergency medical care for concerning symptoms Obtain kidney x-ray at [facility] bar soap for bathing We will call you with lab and x-ray results Follow-up as needed  Urinary Tract Infection, Adult A urinary tract infection (UTI) is an infection of any part of the urinary tract. The urinary tract includes:  The kidneys.  The ureters.  The bladder.  The urethra. These organs make, store, and get rid of pee (urine) in the body. What are the causes? This infection is caused by germs (bacteria) in your genital area. These germs grow and cause swelling (inflammation) of your urinary tract. What increases the risk? The following factors may make you more likely to develop this condition:  Using a small, thin tube (catheter) to drain pee.  Not being able to control when you pee or poop (incontinence).  Being female. If you are female, these things can increase the risk: ? Using these methods to prevent pregnancy:  A medicine that kills sperm (spermicide).  A device that blocks sperm (diaphragm). ? Having low levels of a female hormone (estrogen). ? Being pregnant. You are more likely to develop this condition if:  You have genes that add to your risk.  You are sexually active.  You take antibiotic medicines.  You have trouble peeing because of: ? A prostate that is bigger than normal, if you are female. ? A blockage in the part of your body that drains pee from the bladder. ? A kidney stone. ? A nerve condition that affects your bladder. ? Not getting enough to drink. ? Not peeing often enough.  You have other conditions, such as: ? Diabetes. ? A weak disease-fighting system (immune system). ? Sickle cell disease. ? Gout. ? Injury of the spine. What are the signs or symptoms? Symptoms of this condition include:  Needing to pee right away.  Peeing small amounts often.  Pain or  burning when peeing.  Blood in the pee.  Pee that smells bad or not like normal.  Trouble peeing.  Pee that is cloudy.  Fluid coming from the vagina, if you are female.  Pain in the belly or lower back. Other symptoms include:  Vomiting.  Not feeling hungry.  Feeling mixed up (confused). This may be the first symptom in older adults.  Being tired and grouchy (irritable).  A fever.  Watery poop (diarrhea). How is this treated?  Taking antibiotic medicine.  Taking other medicines.  Drinking enough water. In some cases, you may need to see a specialist. Follow these instructions at home: Medicines  Take over-the-counter and prescription medicines only as told by your doctor.  If you were prescribed an antibiotic medicine, take it as told by your doctor. Do not stop taking it even if you start to feel better. General instructions  Make sure you: ? Pee until your bladder is empty. ? Do not hold pee for a long time. ? Empty your bladder after sex. ? Wipe from front to back after peeing or pooping if you are a female. Use each tissue one time when you wipe.  Drink enough fluid to keep your pee pale yellow.  Keep all follow-up visits.   Contact a doctor if:  You do not get better after 1-2 days.  Your symptoms go away and then come back. Get help right away if:  You have very bad back pain.  You have very bad pain in your lower  belly.  You have a fever.  You have chills.  You feeling like you will vomit or you vomit. Summary  A urinary tract infection (UTI) is an infection of any part of the urinary tract.  This condition is caused by germs in your genital area.  There are many risk factors for a UTI.  Treatment includes antibiotic medicines.  Drink enough fluid to keep your pee pale yellow. This information is not intended to replace advice given to you by your health care provider. Make sure you discuss any questions you have with your health  care provider. Document Revised: 09/13/2019 Document Reviewed: 09/13/2019 Elsevier Patient Education  2021 [employer].

## 2020-05-20 LAB — COMPREHENSIVE METABOLIC PANEL
ALT: 34 IU/L — ABNORMAL HIGH (ref 0–32)
AST: 19 IU/L (ref 0–40)
Albumin/Globulin Ratio: 1.7 (ref 1.2–2.2)
Albumin: 4.2 g/dL (ref 3.8–4.9)
Alkaline Phosphatase: 202 IU/L — ABNORMAL HIGH (ref 44–121)
BUN/Creatinine Ratio: 14 (ref 9–23)
BUN: 10 mg/dL (ref 6–24)
Bilirubin Total: 0.8 mg/dL (ref 0.0–1.2)
CO2: 25 mmol/L (ref 20–29)
Calcium: 9.4 mg/dL (ref 8.7–10.2)
Chloride: 104 mmol/L (ref 96–106)
Creatinine, Ser: 0.72 mg/dL (ref 0.57–1.00)
Globulin, Total: 2.5 g/dL (ref 1.5–4.5)
Glucose: 152 mg/dL — ABNORMAL HIGH (ref 65–99)
Potassium: 4.2 mmol/L (ref 3.5–5.2)
Sodium: 142 mmol/L (ref 134–144)
Total Protein: 6.7 g/dL (ref 6.0–8.5)
eGFR: 96 mL/min/{1.73_m2} (ref >59)

## 2020-05-20 LAB — CBC WITH DIFFERENTIAL/PLATELET
Basophils Absolute: 0.1 10*3/uL (ref 0.0–0.2)
Basos: 1 %
EOS (ABSOLUTE): 0.3 10*3/uL (ref 0.0–0.4)
Eos: 3 %
Hematocrit: 45.7 % (ref 34.0–46.6)
Hemoglobin: 14.9 g/dL (ref 11.1–15.9)
Immature Grans (Abs): 0 10*3/uL (ref 0.0–0.1)
Immature Granulocytes: 0 %
Lymphocytes Absolute: 1.8 10*3/uL (ref 0.7–3.1)
Lymphs: 20 %
MCH: 29.9 pg (ref 26.6–33.0)
MCHC: 32.6 g/dL (ref 31.5–35.7)
MCV: 92 fL (ref 79–97)
Monocytes Absolute: 0.6 10*3/uL (ref 0.1–0.9)
Monocytes: 6 %
Neutrophils Absolute: 6.5 10*3/uL (ref 1.4–7.0)
Neutrophils: 70 %
Platelets: 247 10*3/uL (ref 150–450)
RBC: 4.99 x10E6/uL (ref 3.77–5.28)
RDW: 12.9 % (ref 11.7–15.4)
WBC: 9.2 10*3/uL (ref 3.4–10.8)

## 2020-05-21 LAB — URINE CULTURE

## 2020-05-29 NOTE — Addendum Note (Signed)
Addended by: Wandalee Ferdinand on: 05/29/2020 11:25 AM   Modules accepted: Orders

## 2020-05-30 ENCOUNTER — Other Ambulatory Visit: Payer: Self-pay | Admitting: Family Medicine

## 2020-05-30 DIAGNOSIS — E7849 Other hyperlipidemia: Secondary | ICD-10-CM

## 2020-06-01 ENCOUNTER — Other Ambulatory Visit: Payer: Self-pay | Admitting: Physician Assistant

## 2020-06-01 DIAGNOSIS — G4733 Obstructive sleep apnea (adult) (pediatric): Secondary | ICD-10-CM | POA: Diagnosis not present

## 2020-06-01 MED ORDER — ATORVASTATIN CALCIUM 20 MG PO TABS: 20.0000 mg | ORAL_TABLET | Freq: Every day | ORAL | 0 refills | Status: DC

## 2020-06-09 DIAGNOSIS — J449 Chronic obstructive pulmonary disease, unspecified: Secondary | ICD-10-CM | POA: Diagnosis not present

## 2020-06-09 DIAGNOSIS — J4520 Mild intermittent asthma, uncomplicated: Secondary | ICD-10-CM | POA: Diagnosis not present

## 2020-06-18 ENCOUNTER — Ambulatory Visit (INDEPENDENT_AMBULATORY_CARE_PROVIDER_SITE_OTHER): Payer: Medicare HMO

## 2020-06-18 DIAGNOSIS — Z1152 Encounter for screening for COVID-19: Secondary | ICD-10-CM | POA: Diagnosis not present

## 2020-06-18 LAB — POC COVID19 BINAXNOW: SARS Coronavirus 2 Ag: NEGATIVE

## 2020-06-18 NOTE — Progress Notes (Signed)
Patient Name: Judy [name] Date of Birth: [birth year] MRN:  [id number]  RICHELE STRAND is a 60 y.o. yo female presenting for COVID-19 testing.  She is being tested from the vehicle.  JASENIA WEILBACHER is being tested due to a requirement for upcoming travel.  Results for orders placed or performed in visit on 06/18/20 (from the past 24 hour(s))  POC COVID-19     Status: Normal   Collection Time: 06/18/20  8:58 AM  Result Value Ref Range   SARS Coronavirus 2 Ag Negative Negative     Jacklynn Bue, LPN 9:03 AM

## 2020-07-01 ENCOUNTER — Other Ambulatory Visit: Payer: Self-pay | Admitting: Family Medicine

## 2020-07-01 ENCOUNTER — Ambulatory Visit (INDEPENDENT_AMBULATORY_CARE_PROVIDER_SITE_OTHER): Payer: Medicare HMO | Admitting: Family Medicine

## 2020-07-01 VITALS — BP 124/80 | HR 88 | Temp 98.0°F | Resp 18

## 2020-07-01 DIAGNOSIS — U071 COVID-19: Secondary | ICD-10-CM | POA: Diagnosis not present

## 2020-07-01 DIAGNOSIS — M791 Myalgia, unspecified site: Secondary | ICD-10-CM

## 2020-07-01 DIAGNOSIS — J9611 Chronic respiratory failure with hypoxia: Secondary | ICD-10-CM

## 2020-07-01 DIAGNOSIS — J208 Acute bronchitis due to other specified organisms: Secondary | ICD-10-CM

## 2020-07-01 DIAGNOSIS — J029 Acute pharyngitis, unspecified: Secondary | ICD-10-CM | POA: Diagnosis not present

## 2020-07-01 DIAGNOSIS — G4733 Obstructive sleep apnea (adult) (pediatric): Secondary | ICD-10-CM | POA: Diagnosis not present

## 2020-07-01 LAB — POCT INFLUENZA A/B: Influenza A, POC: NEGATIVE

## 2020-07-01 LAB — POCT RAPID STREP A (OFFICE): Rapid Strep A Screen: NEGATIVE

## 2020-07-01 LAB — POC COVID19 BINAXNOW: SARS Coronavirus 2 Ag: POSITIVE — AB

## 2020-07-01 MED ORDER — BENZONATATE 200 MG PO CAPS: 200.0000 mg | ORAL_CAPSULE | Freq: Three times a day (TID) | ORAL | 1 refills | Status: DC | PRN

## 2020-07-01 NOTE — Progress Notes (Signed)
Acute Office Visit  Subjective:    Patient ID: Judy [name], female    DOB: [birth year], 60 y.o.   MRN: [id number]  Chief Complaint  Patient presents with  . URI    HPI Patient is in today for URI. Symptoms started Monday with a fever, had congestion, cough, sob, scratchy throat no ear pain. At home Covid test on Monday was negative   Past Medical History:  Diagnosis Date  . Anxiety   . Back pain   . Bilateral swelling of feet   . Chronic combined systolic and diastolic CHF (congestive heart failure) ([place]) 10/07/2014  . COPD (chronic obstructive pulmonary disease) ([place])   . DCM (dilated cardiomyopathy) ([place]) 04/25/2014   EF 28% by MRI despite maximum medical therapy  . Depression   . Diabetes mellitus without complication ([place])   . Epilepsy ([place])    as a child  . Fatty liver   . Former tobacco use   . Hyperlipidemia   . Hypertension   . Hypothyroidism   . Joint pain   . Leg swelling   . Morbid obesity ([place])   . NICM (nonischemic cardiomyopathy) ([place])   . OSA (obstructive sleep apnea)    moderate with AHI 26/hr with oxygen desaturations as low as 70%  . Scoliosis   . Sleep apnea   . SOB (shortness of breath)     Past Surgical History:  Procedure Laterality Date  . ABDOMINAL HYSTERECTOMY    . ankle artery involved cyst removal    . CARDIAC CATHETERIZATION  05/14/2014   normal coronary arteries  . CARPAL TUNNEL RELEASE    . FASCIOTOMY     1993 for platar fasciitis  . harrington rod scoliosis     1981  . LEFT HEART CATHETERIZATION WITH CORONARY ANGIOGRAM N/A 05/14/2014   Procedure: LEFT HEART CATHETERIZATION WITH CORONARY ANGIOGRAM;  Surgeon: Wellington Hampshire, MD;  Location: [facility];  Service: Cardiovascular;  Laterality: N/A;  . ROTATOR CUFF REPAIR    . UMBILICAL HERNIA REPAIR    . VESICOVAGINAL FISTULA CLOSURE W/ TAH      Family History  Problem Relation Age of Onset  . Emphysema Father   . Allergies Father   . Heart failure Father   . Heart  disease Father 68       MI  . Diabetes Father   . Hyperlipidemia Father   . Hypertension Father   . Thyroid disease Father   . Alcohol abuse Father   . Obesity Father   . Diabetes Mother   . Hyperlipidemia Mother   . Obesity Mother     Social History   Socioeconomic History  . Marital status: Married    Spouse name: Not on file  . Number of children: Not on file  . Years of education: Not on file  . Highest education level: Not on file  Occupational History  . Occupation: disabled  Tobacco Use  . Smoking status: Former Smoker    Packs/day: 1.00    Years: 15.00    Pack years: 15.00    Types: Cigarettes    Quit date: 02/14/2005    Years since quitting: 15.4  . Smokeless tobacco: Never Used  Vaping Use  . Vaping Use: Never used  Substance and Sexual Activity  . Alcohol use: Yes    Comment: occ  . Drug use: No  . Sexual activity: Not on file  Other Topics Concern  . Not on file  Social History Narrative  Lives in [place] with spouse and son.   Previously worked as a Engineer, manufacturing         Social Determinants of Sales executive: Not on file  Food Insecurity: No Food Insecurity  . Worried About Charity fundraiser in the Last Year: Never true  . Ran Out of Food in the Last Year: Never true  Transportation Needs: No Transportation Needs  . Lack of Transportation (Medical): No  . Lack of Transportation (Non-Medical): No  Physical Activity: Inactive  . Days of Exercise per Week: 0 days  . Minutes of Exercise per Session: 0 min  Stress: Not on file  Social Connections: Not on file  Intimate Partner Violence: Not on file    Outpatient Medications Prior to Visit  Medication Sig Dispense Refill  . albuterol (VENTOLIN HFA) 108 (90 Base) MCG/ACT inhaler Inhale 2 puffs into the lungs every 6 (six) hours as needed for wheezing or shortness of breath. (Patient not taking: Reported on 05/13/2020) 8 g 2  . aspirin 81 MG tablet Take 81 mg  by mouth at bedtime.     Marland Kitchen atorvastatin (LIPITOR) 20 MG tablet Take 1 tablet (20 mg total) by mouth daily. 90 tablet 0  . buPROPion (WELLBUTRIN XL) 300 MG 24 hr tablet TAKE 1 TABLET EVERY DAY 90 tablet 3  . carvedilol (COREG) 6.25 MG tablet Take 1 tablet (6.25 mg total) by mouth 2 (two) times daily. 180 tablet 3  . clonazePAM (KLONOPIN) 0.5 MG tablet Take 1 tablet (0.5 mg total) by mouth 2 (two) times daily as needed for anxiety (panic attacks). 180 tablet 0  . Coenzyme Q10 (CO Q 10) 100 MG CAPS Take 200 mg by mouth daily.    . diphenhydrAMINE (BENADRYL) 25 MG tablet Take 25 mg by mouth at bedtime as needed.    . Dulaglutide (TRULICITY) 3 HG/9.9ME SOPN Inject 3 mg as directed once a week. 2 mL 0  . FARXIGA 10 MG TABS tablet Take 1 tablet (10 mg total) by mouth daily. 90 tablet 3  . gabapentin (NEURONTIN) 400 MG capsule TAKE 2 CAPSULES IN THE MORNING AND TAKE 2 CAPSULES IN THE EVENING 360 capsule 3  . icosapent Ethyl (VASCEPA) 1 g capsule Take 2 capsules (2 g total) by mouth 2 (two) times daily. 360 capsule 1  . levothyroxine (SYNTHROID) 75 MCG tablet TAKE 1 TABLET (75 MCG TOTAL) BY MOUTH DAILY. 90 tablet 1  . Lutein 20 MG TABS Take 1 tablet by mouth daily.    . Multiple Vitamin (MULTIVITAMIN WITH MINERALS) TABS tablet Take 1 tablet by mouth daily.    . OXYGEN 2lpm 2 lpm as needed    . sacubitril-valsartan (ENTRESTO) 24-26 MG Take 1 tablet by mouth 2 (two) times daily. 180 tablet 3  . sulfamethoxazole-trimethoprim (BACTRIM DS) 800-160 MG tablet Take 1 tablet by mouth 2 (two) times daily. 14 tablet 0  . Tiotropium Bromide-Olodaterol (STIOLTO RESPIMAT) 2.5-2.5 MCG/ACT AERS INHALE 2 PUFFS INTO THE LUNGS DAILY. 12 g 5  . tiZANidine (ZANAFLEX) 4 MG tablet Take 1 tablet (4 mg total) by mouth every 6 (six) hours as needed for muscle spasms. 360 tablet 1  . traMADol (ULTRAM) 50 MG tablet Take 1 tablet (50 mg total) by mouth 2 (two) times daily. 60 tablet 2  . UNABLE TO FIND CPAP with o2 2lpm  DME- [facility]     . Vitamin D, Ergocalciferol, (DRISDOL) 1.25 MG (50000 UNIT) CAPS capsule TAKE 1 CAPSULE EVERY 7 DAYS  12 capsule 3   No facility-administered medications prior to visit.    Allergies  Allergen Reactions  . Demerol [Meperidine] Nausea And Vomiting    Review of Systems  Constitutional: Positive for fever. Negative for chills and fatigue.  HENT: Negative for congestion, ear pain and sore throat (scratchy throat).   Respiratory: Positive for cough and shortness of breath. Negative for wheezing.   Cardiovascular: Negative for chest pain.  Musculoskeletal: Negative for myalgias.  Neurological: Positive for headaches.       Objective:    Physical Exam Vitals reviewed.  Constitutional:      Appearance: Normal appearance. She is obese.  HENT:     Right Ear: Tympanic membrane, ear canal and external ear normal.     Left Ear: Tympanic membrane and external ear normal.     Nose: Congestion present.     Right Turbinates: Swollen.     Left Turbinates: Swollen.     Mouth/Throat:     Pharynx: Oropharynx is clear. Posterior oropharyngeal erythema present.  Cardiovascular:     Rate and Rhythm: Normal rate and regular rhythm.     Pulses: Normal pulses.     Heart sounds: Normal heart sounds. No murmur heard.   Pulmonary:     Effort: Pulmonary effort is normal. No respiratory distress.     Breath sounds: Normal breath sounds.  Abdominal:     General: Abdomen is flat. Bowel sounds are normal.     Palpations: Abdomen is soft.     Tenderness: There is no abdominal tenderness.  Neurological:     Mental Status: She is alert and oriented to person, place, and time.  Psychiatric:        Mood and Affect: Mood normal.        Behavior: Behavior normal.     BP 124/80 (BP Location: Left Arm, Patient Position: Sitting, Cuff Size: Large)   Pulse 88   Temp 98 F (36.7 C)   Resp 18   SpO2 96%  Wt Readings from Last 3 Encounters:  05/19/20 286 lb (129.7 kg)  04/27/20 285 lb (129.3 kg)   04/20/20 287 lb 3.2 oz (130.3 kg)    There are no preventive care reminders to display for this patient.  There are no preventive care reminders to display for this patient.   Lab Results  Component Value Date   TSH 3.070 04/06/2020   Lab Results  Component Value Date   WBC 9.2 05/19/2020   HGB 14.9 05/19/2020   HCT 45.7 05/19/2020   MCV 92 05/19/2020   PLT 247 05/19/2020   Lab Results  Component Value Date   NA 142 05/19/2020   K 4.2 05/19/2020   CO2 25 05/19/2020   GLUCOSE 152 (H) 05/19/2020   BUN 10 05/19/2020   CREATININE 0.72 05/19/2020   BILITOT 0.8 05/19/2020   ALKPHOS 202 (H) 05/19/2020   AST 19 05/19/2020   ALT 34 (H) 05/19/2020   PROT 6.7 05/19/2020   ALBUMIN 4.2 05/19/2020   CALCIUM 9.4 05/19/2020   ANIONGAP 13 09/24/2018   EGFR 96 05/19/2020   GFR 100.87 10/01/2014   Lab Results  Component Value Date   CHOL 154 04/06/2020   Lab Results  Component Value Date   HDL 42 04/06/2020   Lab Results  Component Value Date   LDLCALC 83 04/06/2020   Lab Results  Component Value Date   TRIG 170 (H) 04/06/2020   Lab Results  Component Value Date   CHOLHDL 3.7 04/06/2020  Lab Results  Component Value Date   HGBA1C 7.3 (H) 04/06/2020       Assessment & Plan:  1. Acute bronchitis due to COVID-19 virus Rx for molnupiravir sent.  Continue inhalers.  Use oxygen 2-3 L prn. She already uses this at night.  Rx for tessalon perles sent.  - POC COVID-19 BinaxNow positive - POCT Influenza A/B negative - POCT rapid strep A negative - Ambulatory Referral for Covid Treatment   2. Chronic respiratory failure.  Continue Oxygen at night. May increase if pulse oximetry drops.    3. Pharyngitis, unspecified etiology - Strep negative.   4. Myalgia - Influenza A and B negative.    Follow-up: Return if symptoms worsen or fail to improve.  An After Visit Summary was printed and given to the patient.  Rochel Brome, MD [facility] [phone]

## 2020-07-02 ENCOUNTER — Telehealth: Payer: Self-pay

## 2020-07-02 ENCOUNTER — Other Ambulatory Visit: Payer: Self-pay | Admitting: Family Medicine

## 2020-07-02 ENCOUNTER — Other Ambulatory Visit: Payer: Self-pay | Admitting: Physician Assistant

## 2020-07-02 MED ORDER — NIRMATRELVIR/RITONAVIR (PAXLOVID)TABLET: 3.0000 | ORAL_TABLET | Freq: Two times a day (BID) | ORAL | 0 refills | Status: DC

## 2020-07-02 NOTE — Progress Notes (Signed)
Outpatient Oral COVID Treatment Note  I connected with Judy [name] on 07/02/2020/1:23 PM by telephone and verified that I am speaking with the correct person using two identifiers.  I discussed the limitations, risks, security, and privacy concerns of performing an evaluation and management service by telephone and the availability of in person appointments. I also discussed with the patient that there may be a patient responsible charge related to this service. The patient expressed understanding and agreed to proceed.  Patient location: home Provider location: office  Diagnosis: COVID-19 infection  Purpose of visit: Discussion of potential use of Molnupiravir or Paxlovid, a new treatment for mild to moderate COVID-19 viral infection in non-hospitalized patients.   Subjective: Patient is a 60 y.o. female who has been diagnosed with COVID 19 viral infection.  Their symptoms began on 5/16 with cough and cold.    Past Medical History:  Diagnosis Date  . Anxiety   . Back pain   . Bilateral swelling of feet   . Chronic combined systolic and diastolic CHF (congestive heart failure) ([place]) 10/07/2014  . COPD (chronic obstructive pulmonary disease) ([place])   . DCM (dilated cardiomyopathy) ([place]) 04/25/2014   EF 28% by MRI despite maximum medical therapy  . Depression   . Diabetes mellitus without complication ([place])   . Epilepsy ([place])    as a child  . Fatty liver   . Former tobacco use   . Hyperlipidemia   . Hypertension   . Hypothyroidism   . Joint pain   . Leg swelling   . Morbid obesity ([place])   . NICM (nonischemic cardiomyopathy) ([place])   . OSA (obstructive sleep apnea)    moderate with AHI 26/hr with oxygen desaturations as low as 70%  . Scoliosis   . Sleep apnea   . SOB (shortness of breath)     Allergies  Allergen Reactions  . Demerol [Meperidine] Nausea And Vomiting     Current Outpatient Medications:  .  albuterol (VENTOLIN HFA) 108 (90 Base) MCG/ACT inhaler, Inhale 2  puffs into the lungs every 6 (six) hours as needed for wheezing or shortness of breath. (Patient not taking: Reported on 05/13/2020), Disp: 8 g, Rfl: 2 .  aspirin 81 MG tablet, Take 81 mg by mouth at bedtime. , Disp: , Rfl:  .  atorvastatin (LIPITOR) 20 MG tablet, Take 1 tablet (20 mg total) by mouth daily., Disp: 90 tablet, Rfl: 0 .  benzonatate (TESSALON) 200 MG capsule, Take 1 capsule (200 mg total) by mouth 3 (three) times daily as needed for cough., Disp: 30 capsule, Rfl: 1 .  buPROPion (WELLBUTRIN XL) 300 MG 24 hr tablet, TAKE 1 TABLET EVERY DAY, Disp: 90 tablet, Rfl: 3 .  carvedilol (COREG) 6.25 MG tablet, Take 1 tablet (6.25 mg total) by mouth 2 (two) times daily., Disp: 180 tablet, Rfl: 3 .  clonazePAM (KLONOPIN) 0.5 MG tablet, Take 1 tablet (0.5 mg total) by mouth 2 (two) times daily as needed for anxiety (panic attacks)., Disp: 180 tablet, Rfl: 0 .  Coenzyme Q10 (CO Q 10) 100 MG CAPS, Take 200 mg by mouth daily., Disp: , Rfl:  .  diphenhydrAMINE (BENADRYL) 25 MG tablet, Take 25 mg by mouth at bedtime as needed., Disp: , Rfl:  .  Dulaglutide (TRULICITY) 3 AF/7.9UX SOPN, Inject 3 mg as directed once a week., Disp: 2 mL, Rfl: 0 .  FARXIGA 10 MG TABS tablet, Take 1 tablet (10 mg total) by mouth daily., Disp: 90 tablet, Rfl: 3 .  gabapentin (NEURONTIN) 400 MG capsule, TAKE 2 CAPSULES IN THE MORNING AND TAKE 2 CAPSULES IN THE EVENING, Disp: 360 capsule, Rfl: 3 .  icosapent Ethyl (VASCEPA) 1 g capsule, Take 2 capsules (2 g total) by mouth 2 (two) times daily., Disp: 360 capsule, Rfl: 1 .  levothyroxine (SYNTHROID) 75 MCG tablet, TAKE 1 TABLET (75 MCG TOTAL) BY MOUTH DAILY., Disp: 90 tablet, Rfl: 1 .  Lutein 20 MG TABS, Take 1 tablet by mouth daily., Disp: , Rfl:  .  Multiple Vitamin (MULTIVITAMIN WITH MINERALS) TABS tablet, Take 1 tablet by mouth daily., Disp: , Rfl:  .  OXYGEN, 2lpm 2 lpm as needed, Disp: , Rfl:  .  sacubitril-valsartan (ENTRESTO) 24-26 MG, Take 1 tablet by mouth 2 (two) times  daily., Disp: 180 tablet, Rfl: 3 .  sulfamethoxazole-trimethoprim (BACTRIM DS) 800-160 MG tablet, Take 1 tablet by mouth 2 (two) times daily., Disp: 14 tablet, Rfl: 0 .  Tiotropium Bromide-Olodaterol (STIOLTO RESPIMAT) 2.5-2.5 MCG/ACT AERS, INHALE 2 PUFFS INTO THE LUNGS DAILY., Disp: 12 g, Rfl: 5 .  tiZANidine (ZANAFLEX) 4 MG tablet, Take 1 tablet (4 mg total) by mouth every 6 (six) hours as needed for muscle spasms., Disp: 360 tablet, Rfl: 1 .  traMADol (ULTRAM) 50 MG tablet, Take 1 tablet (50 mg total) by mouth 2 (two) times daily., Disp: 60 tablet, Rfl: 2 .  UNABLE TO FIND, CPAP with o2 2lpm  DME- [facility], Disp: , Rfl:  .  Vitamin D, Ergocalciferol, (DRISDOL) 1.25 MG (50000 UNIT) CAPS capsule, TAKE 1 CAPSULE EVERY 7 DAYS, Disp: 12 capsule, Rfl: 3  Objective: Patient sound stable.  They are in no apparent distress.  Breathing is non labored.  Mood and behavior are normal.  Laboratory Data:  Recent Results (from the past 2160 hour(s))  CBC With Diff/Platelet     Status: None   Collection Time: 04/06/20  2:50 PM  Result Value Ref Range   WBC 7.9 3.4 - 10.8 x10E3/uL   RBC 4.89 3.77 - 5.28 x10E6/uL   Hemoglobin 14.8 11.1 - 15.9 g/dL   Hematocrit 45.1 34.0 - 46.6 %   MCV 92 79 - 97 fL   MCH 30.3 26.6 - 33.0 pg   MCHC 32.8 31.5 - 35.7 g/dL   RDW 13.1 11.7 - 15.4 %   Platelets 222 150 - 450 x10E3/uL   Neutrophils 66 Not Estab. %   Lymphs 22 Not Estab. %   Monocytes 8 Not Estab. %   Eos 3 Not Estab. %   Basos 1 Not Estab. %   Neutrophils Absolute 5.2 1.4 - 7.0 x10E3/uL   Lymphocytes Absolute 1.7 0.7 - 3.1 x10E3/uL   Monocytes Absolute 0.6 0.1 - 0.9 x10E3/uL   EOS (ABSOLUTE) 0.3 0.0 - 0.4 x10E3/uL   Basophils Absolute 0.1 0.0 - 0.2 x10E3/uL   Immature Granulocytes 0 Not Estab. %   Immature Grans (Abs) 0.0 0.0 - 0.1 x10E3/uL  Comprehensive metabolic panel     Status: Abnormal   Collection Time: 04/06/20  2:50 PM  Result Value Ref Range   Glucose 148 (H) 65 - 99 mg/dL   BUN 16 6 - 24 mg/dL    Creatinine, Ser 0.85 0.57 - 1.00 mg/dL    Comment:                **Effective April 13, 2020 [facility] will begin**                  reporting the 2021 CKD-EPI creatinine equation that  estimates kidney function without a race variable.    GFR calc non Af Amer 75 >59 mL/min/1.73   GFR calc Af Amer 87 >59 mL/min/1.73    Comment: **In accordance with recommendations from the NKF-ASN Task force,**   [facility] is in the process of updating its eGFR calculation to the   2021 CKD-EPI creatinine equation that estimates kidney function   without a race variable.    BUN/Creatinine Ratio 19 9 - 23   Sodium 140 134 - 144 mmol/L   Potassium 4.7 3.5 - 5.2 mmol/L   Chloride 102 96 - 106 mmol/L   CO2 21 20 - 29 mmol/L   Calcium 9.5 8.7 - 10.2 mg/dL   Total Protein 6.2 6.0 - 8.5 g/dL   Albumin 4.0 3.8 - 4.9 g/dL   Globulin, Total 2.2 1.5 - 4.5 g/dL   Albumin/Globulin Ratio 1.8 1.2 - 2.2   Bilirubin Total 0.6 0.0 - 1.2 mg/dL   Alkaline Phosphatase 192 (H) 44 - 121 IU/L   AST 20 0 - 40 IU/L   ALT 29 0 - 32 IU/L  Lipid panel     Status: Abnormal   Collection Time: 04/06/20  2:50 PM  Result Value Ref Range   Cholesterol, Total 154 100 - 199 mg/dL   Triglycerides 170 (H) 0 - 149 mg/dL   HDL 42 >39 mg/dL   VLDL Cholesterol Cal 29 5 - 40 mg/dL   LDL Chol Calc (NIH) 83 0 - 99 mg/dL   Chol/HDL Ratio 3.7 0.0 - 4.4 ratio    Comment:                                   T. Chol/HDL Ratio                                             Men  Women                               1/2 Avg.Risk  3.4    3.3                                   Avg.Risk  5.0    4.4                                2X Avg.Risk  9.6    7.1                                3X Avg.Risk 23.4   11.0   TSH     Status: None   Collection Time: 04/06/20  2:50 PM  Result Value Ref Range   TSH 3.070 0.450 - 4.500 uIU/mL  Hemoglobin A1c     Status: Abnormal   Collection Time: 04/06/20  2:50 PM  Result Value Ref Range   Hgb A1c  MFr Bld 7.3 (H) 4.8 - 5.6 %    Comment:          Prediabetes: 5.7 - 6.4          Diabetes: >6.4  Glycemic control for adults with diabetes: <7.0    Est. average glucose Bld gHb Est-mCnc 163 mg/dL  Cardiovascular Risk Assessment     Status: None   Collection Time: 04/06/20  2:50 PM  Result Value Ref Range   Interpretation Note     Comment: Supplemental report is available.  POCT urinalysis dipstick     Status: Abnormal   Collection Time: 05/19/20  2:42 PM  Result Value Ref Range   Color, UA tea    Clarity, UA     Glucose, UA Positive (A) Negative   Bilirubin, UA negative    Ketones, UA negative    Spec Grav, UA 1.025 1.010 - 1.025   Blood, UA 3+    pH, UA 5.0 5.0 - 8.0   Protein, UA Negative Negative   Urobilinogen, UA 0.2 0.2 or 1.0 E.U./dL   Nitrite, UA negative    Leukocytes, UA Small (1+) (A) Negative   Appearance     Odor    CBC with Differential/Platelet     Status: None   Collection Time: 05/19/20  2:58 PM  Result Value Ref Range   WBC 9.2 3.4 - 10.8 x10E3/uL   RBC 4.99 3.77 - 5.28 x10E6/uL   Hemoglobin 14.9 11.1 - 15.9 g/dL   Hematocrit 45.7 34.0 - 46.6 %   MCV 92 79 - 97 fL   MCH 29.9 26.6 - 33.0 pg   MCHC 32.6 31.5 - 35.7 g/dL   RDW 12.9 11.7 - 15.4 %   Platelets 247 150 - 450 x10E3/uL   Neutrophils 70 Not Estab. %   Lymphs 20 Not Estab. %   Monocytes 6 Not Estab. %   Eos 3 Not Estab. %   Basos 1 Not Estab. %   Neutrophils Absolute 6.5 1.4 - 7.0 x10E3/uL   Lymphocytes Absolute 1.8 0.7 - 3.1 x10E3/uL   Monocytes Absolute 0.6 0.1 - 0.9 x10E3/uL   EOS (ABSOLUTE) 0.3 0.0 - 0.4 x10E3/uL   Basophils Absolute 0.1 0.0 - 0.2 x10E3/uL   Immature Granulocytes 0 Not Estab. %   Immature Grans (Abs) 0.0 0.0 - 0.1 x10E3/uL  Comprehensive metabolic panel     Status: Abnormal   Collection Time: 05/19/20  2:58 PM  Result Value Ref Range   Glucose 152 (H) 65 - 99 mg/dL   BUN 10 6 - 24 mg/dL   Creatinine, Ser 0.72 0.57 - 1.00 mg/dL   eGFR 96 >59 mL/min/1.73    BUN/Creatinine Ratio 14 9 - 23   Sodium 142 134 - 144 mmol/L   Potassium 4.2 3.5 - 5.2 mmol/L   Chloride 104 96 - 106 mmol/L   CO2 25 20 - 29 mmol/L   Calcium 9.4 8.7 - 10.2 mg/dL   Total Protein 6.7 6.0 - 8.5 g/dL   Albumin 4.2 3.8 - 4.9 g/dL   Globulin, Total 2.5 1.5 - 4.5 g/dL   Albumin/Globulin Ratio 1.7 1.2 - 2.2   Bilirubin Total 0.8 0.0 - 1.2 mg/dL   Alkaline Phosphatase 202 (H) 44 - 121 IU/L   AST 19 0 - 40 IU/L   ALT 34 (H) 0 - 32 IU/L  Urine Culture     Status: Abnormal   Collection Time: 05/19/20  4:09 PM   Specimen: Urine   UR  Result Value Ref Range   Urine Culture, Routine Final report (A)    Organism ID, Bacteria Comment (A)     Comment: Escherichia coli, identified by an automated biochemical system. Cefazolin <=4 ug/mL Cefazolin with  an MIC <=16 predicts susceptibility to the oral agents cefaclor, cefdinir, cefpodoxime, cefprozil, cefuroxime, cephalexin, and loracarbef when used for therapy of uncomplicated urinary tract infections due to E. coli, Klebsiella pneumoniae, and Proteus mirabilis. Greater than 100,000 colony forming units per mL    Antimicrobial Susceptibility Comment     Comment:       ** S = Susceptible; I = Intermediate; R = Resistant **                    P = Positive; N = Negative             MICS are expressed in micrograms per mL    Antibiotic                 RSLT#1    RSLT#2    RSLT#3    RSLT#4 Amoxicillin/Clavulanic Acid    S Ampicillin                     S Cefepime                       S Ceftriaxone                    S Cefuroxime                     S Ciprofloxacin                  S Ertapenem                      S Gentamicin                     S Imipenem                       S Levofloxacin                   S Meropenem                      S Nitrofurantoin                 S Piperacillin/Tazobactam        S Tetracycline                   S Tobramycin                     S Trimethoprim/Sulfa             S   POC COVID-19      Status: Normal   Collection Time: 06/18/20  8:58 AM  Result Value Ref Range   SARS Coronavirus 2 Ag Negative Negative    Comment: patient aware  POC COVID-19 BinaxNow     Status: Abnormal   Collection Time: 07/01/20  3:05 PM  Result Value Ref Range   SARS Coronavirus 2 Ag Positive (A) Negative  POCT Influenza A/B     Status: None   Collection Time: 07/01/20  3:05 PM  Result Value Ref Range   Influenza A, POC Negative Negative   Influenza B, POC    POCT rapid strep A     Status: None   Collection Time: 07/01/20  3:05 PM  Result Value Ref Range   Rapid Strep A Screen Negative Negative  Assessment: 60 y.o. female with mild/moderate COVID 19 viral infection diagnosed on 5/18 at high risk for progression to severe COVID 19.  Plan:  This patient is a 60 y.o. female that meets the following criteria for Emergency Use Authorization of: Paxlovid 1. Age >12 yr AND > 40 kg 2. SARS-COV-2 positive test 3. Symptom onset < 5 days 4. Mild-to-moderate COVID disease with high risk for severe progression to hospitalization or death  I have spoken and communicated the following to the patient or parent/caregiver regarding: 1. Paxlovid is an unapproved drug that is authorized for use under an Emergency Use Authorization.  2. There are no adequate, approved, available products for the treatment of COVID-19 in adults who have mild-to-moderate COVID-19 and are at high risk for progressing to severe COVID-19, including hospitalization or death. 3. Other therapeutics are currently authorized. For additional information on all products authorized for treatment or prevention of COVID-19, please see [email or web address].  4. There are benefits and risks of taking this treatment as outlined in the "Fact Sheet for Patients and Caregivers."  5. "Fact Sheet for Patients and Caregivers" was reviewed with  patient. A hard copy will be provided to patient from pharmacy prior to the patient receiving treatment. 6. Patients should continue to self-isolate and use infection control measures (e.g., wear mask, isolate, social distance, avoid sharing personal items, clean and disinfect "high touch" surfaces, and frequent handwashing) according to CDC guidelines.  7. The patient or parent/caregiver has the option to accept or refuse treatment. 8. Patient medication history was reviewed for potential drug interactions:Interaction with home meds: Wellbutrin (can make less effective) 9. Patient's GFR was calculated to be >60, and they were therefore prescribed Normal dose (GFR>60) - nirmatrelvir 142m tab (2 tablet) by mouth twice daily AND ritonavir 1087mtab (1 tablet) by mouth twice daily   After reviewing above information with the patient, the patient agrees to receive Paxlovid.  Follow up instructions:    . Take prescription BID x 5 days as directed . Reach out to pharmacist for counseling on medication if desired . For concerns regarding further COVID symptoms please follow up with your PCP or urgent care . For urgent or life-threatening issues, seek care at your local emergency department  The patient was provided an opportunity to ask questions, and all were answered. The patient agreed with the plan and demonstrated an understanding of the instructions.   Script sent to [facility] and opted to pick up RX.  The patient was advised to call their PCP or seek an in-person evaluation if the symptoms worsen or if the condition fails to improve as anticipated.   I provided 10 minutes of non face-to-face telephone visit time during this encounter, and > 50% was spent counseling as documented under my assessment & plan.  KaAngelena FormPA-C 07/02/2020 /1:23 PM

## 2020-07-02 NOTE — Telephone Encounter (Signed)
Called to discuss with patient about COVID-19 symptoms and the use of one of the available treatments for those with mild to moderate Covid symptoms and at a high risk of hospitalization.  Pt appears to qualify for outpatient treatment due to co-morbid conditions and/or a member of an at-risk group in accordance with the FDA Emergency Use Authorization.    Symptom onset: 06/29/20 Sore throat,congestion,cough Vaccinated: Yes Booster? Yes Immunocompromised? No Qualifiers: DM,COPD.CHF NIH Criteria: Tier 4  Pt. Would like to speak with APP.   Esther Hardy

## 2020-07-06 ENCOUNTER — Telehealth: Payer: Self-pay

## 2020-07-06 ENCOUNTER — Other Ambulatory Visit: Payer: Medicare HMO

## 2020-07-06 NOTE — Telephone Encounter (Signed)
Appt has been RS.

## 2020-07-06 NOTE — Telephone Encounter (Signed)
Pt left a message on the main line wanting to cancel her appt for today and this week with Dr. Sedalia Muta. Appointments have been canceled. I called the pt back to RS the appt and left a message with her husband requesting her to call back about RS the appt

## 2020-07-07 ENCOUNTER — Ambulatory Visit: Payer: Medicare HMO | Admitting: Internal Medicine

## 2020-07-07 ENCOUNTER — Telehealth: Payer: Self-pay

## 2020-07-07 NOTE — Progress Notes (Signed)
Chronic Care Management Pharmacy Assistant   Name: CHAUNTEL WINDSOR  MRN: [id number] DOB: Feb 01, 1961  Judy [name] is an 60 y.o. year old female who presents for his follow-up CCM visit with the clinical pharmacist.  Reason for Encounter: Disease State for DM   Recent office visits:  07/01/2020: Dr. Sedalia Muta (PCP) / bronchitis due to COVID/ No medication changes noted  05/19/2020: Flonnie Hailstone, PA-C (PCP) / Added Sulfamethoxazole-Trimethoprim 800-160 mg. Take 1 tablet twice daily.   Recent consult visits:  None noted since last CCM visit  Hospital visits:  None noted since last CCM visit   Medications: Outpatient Encounter Medications as of 07/07/2020  Medication Sig  . albuterol (VENTOLIN HFA) 108 (90 Base) MCG/ACT inhaler Inhale 2 puffs into the lungs every 6 (six) hours as needed for wheezing or shortness of breath. (Patient not taking: Reported on 05/13/2020)  . aspirin 81 MG tablet Take 81 mg by mouth at bedtime.   Marland Kitchen atorvastatin (LIPITOR) 20 MG tablet Take 1 tablet (20 mg total) by mouth daily.  . benzonatate (TESSALON) 200 MG capsule Take 1 capsule (200 mg total) by mouth 3 (three) times daily as needed for cough.  Marland Kitchen buPROPion (WELLBUTRIN XL) 300 MG 24 hr tablet TAKE 1 TABLET EVERY DAY  . carvedilol (COREG) 6.25 MG tablet Take 1 tablet (6.25 mg total) by mouth 2 (two) times daily.  . clonazePAM (KLONOPIN) 0.5 MG tablet Take 1 tablet (0.5 mg total) by mouth 2 (two) times daily as needed for anxiety (panic attacks).  . Coenzyme Q10 (CO Q 10) 100 MG CAPS Take 200 mg by mouth daily.  . diphenhydrAMINE (BENADRYL) 25 MG tablet Take 25 mg by mouth at bedtime as needed.  . Dulaglutide (TRULICITY) 3 MG/0.5ML SOPN Inject 3 mg as directed once a week.  Marland Kitchen FARXIGA 10 MG TABS tablet Take 1 tablet (10 mg total) by mouth daily.  Marland Kitchen gabapentin (NEURONTIN) 400 MG capsule TAKE 2 CAPSULES IN THE MORNING AND TAKE 2 CAPSULES IN THE EVENING  . icosapent Ethyl (VASCEPA) 1 g capsule Take 2  capsules (2 g total) by mouth 2 (two) times daily.  Marland Kitchen levothyroxine (SYNTHROID) 75 MCG tablet TAKE 1 TABLET (75 MCG TOTAL) BY MOUTH DAILY.  Marland Kitchen Lutein 20 MG TABS Take 1 tablet by mouth daily.  . Multiple Vitamin (MULTIVITAMIN WITH MINERALS) TABS tablet Take 1 tablet by mouth daily.  . nirmatrelvir/ritonavir EUA (PAXLOVID) TABS Take 3 tablets by mouth 2 (two) times daily.  . OXYGEN 2lpm 2 lpm as needed  . sacubitril-valsartan (ENTRESTO) 24-26 MG Take 1 tablet by mouth 2 (two) times daily.  Marland Kitchen sulfamethoxazole-trimethoprim (BACTRIM DS) 800-160 MG tablet Take 1 tablet by mouth 2 (two) times daily.  . Tiotropium Bromide-Olodaterol (STIOLTO RESPIMAT) 2.5-2.5 MCG/ACT AERS INHALE 2 PUFFS INTO THE LUNGS DAILY.  Marland Kitchen tiZANidine (ZANAFLEX) 4 MG tablet Take 1 tablet (4 mg total) by mouth every 6 (six) hours as needed for muscle spasms.  . traMADol (ULTRAM) 50 MG tablet Take 1 tablet (50 mg total) by mouth 2 (two) times daily.  Marland Kitchen UNABLE TO FIND CPAP with o2 2lpm  DME- [facility]  . Vitamin D, Ergocalciferol, (DRISDOL) 1.25 MG (50000 UNIT) CAPS capsule TAKE 1 CAPSULE EVERY 7 DAYS   No facility-administered encounter medications on file as of 07/07/2020.   Recent Relevant Labs: Lab Results  Component Value Date/Time   HGBA1C 7.3 (H) 04/06/2020 02:50 PM   HGBA1C 7.7 (H) 12/16/2019 09:38 AM   MICROALBUR Negative 09/03/2019 02:33 PM  Kidney Function Lab Results  Component Value Date/Time   CREATININE 0.72 05/19/2020 02:58 PM   CREATININE 0.85 04/06/2020 02:50 PM   CREATININE 0.75 12/09/2015 10:57 AM   CREATININE 0.69 10/23/2015 02:37 PM   GFR 100.87 10/01/2014 10:54 AM   GFRNONAA 75 04/06/2020 02:50 PM   GFRAA 87 04/06/2020 02:50 PM    Current antihyperglycemic regimen:   Trulicity 3 mg weekly   Farxiga 10 mg daily   Patient verbally confirms she is taking the above medications as directed. Yes  Adherence Review: Is the patient currently on a STATIN medication? Yes   Is the patient currently on  ACE/ARB medication? Yes   Does the patient have >5 day gap between last estimated fill dates? CPP to review  Star Rating Drugs:  Medication:  Last Fill: Day Supply Atorvastatin  06/01/2020 90DS Sacubitril-Valsartan 05/04/2020 90DS    Patient reported that she has not been taking her blood glucose this week. She stated that she just returned from an [place] cruise last week and went to see Dr. Sedalia Muta on 06/30/2020 for sore throat, and tested positive for COVID. She stated that she has just not felt like "doing anything" and has not checked her sugars.   Josiah Lobo, CMA  [phone] Clinical Pharmacist Assistant

## 2020-07-08 ENCOUNTER — Encounter: Payer: Self-pay | Admitting: Family Medicine

## 2020-07-09 ENCOUNTER — Ambulatory Visit (INDEPENDENT_AMBULATORY_CARE_PROVIDER_SITE_OTHER): Payer: Medicare HMO | Admitting: Family Medicine

## 2020-07-09 ENCOUNTER — Encounter: Payer: Self-pay | Admitting: Family Medicine

## 2020-07-09 ENCOUNTER — Ambulatory Visit: Payer: Medicare HMO | Admitting: Family Medicine

## 2020-07-09 VITALS — BP 128/76 | HR 87 | Temp 97.4°F | Ht 65.0 in | Wt 285.0 lb

## 2020-07-09 DIAGNOSIS — E038 Other specified hypothyroidism: Secondary | ICD-10-CM | POA: Diagnosis not present

## 2020-07-09 DIAGNOSIS — J4520 Mild intermittent asthma, uncomplicated: Secondary | ICD-10-CM | POA: Diagnosis not present

## 2020-07-09 DIAGNOSIS — G8929 Other chronic pain: Secondary | ICD-10-CM

## 2020-07-09 DIAGNOSIS — J410 Simple chronic bronchitis: Secondary | ICD-10-CM

## 2020-07-09 DIAGNOSIS — Z6841 Body Mass Index (BMI) 40.0 and over, adult: Secondary | ICD-10-CM

## 2020-07-09 DIAGNOSIS — E1169 Type 2 diabetes mellitus with other specified complication: Secondary | ICD-10-CM

## 2020-07-09 DIAGNOSIS — G4733 Obstructive sleep apnea (adult) (pediatric): Secondary | ICD-10-CM

## 2020-07-09 DIAGNOSIS — M545 Low back pain, unspecified: Secondary | ICD-10-CM

## 2020-07-09 DIAGNOSIS — E785 Hyperlipidemia, unspecified: Secondary | ICD-10-CM

## 2020-07-09 DIAGNOSIS — J9611 Chronic respiratory failure with hypoxia: Secondary | ICD-10-CM | POA: Diagnosis not present

## 2020-07-09 DIAGNOSIS — I10 Essential (primary) hypertension: Secondary | ICD-10-CM

## 2020-07-09 DIAGNOSIS — M546 Pain in thoracic spine: Secondary | ICD-10-CM | POA: Diagnosis not present

## 2020-07-09 DIAGNOSIS — I5022 Chronic systolic (congestive) heart failure: Secondary | ICD-10-CM

## 2020-07-09 DIAGNOSIS — J449 Chronic obstructive pulmonary disease, unspecified: Secondary | ICD-10-CM | POA: Diagnosis not present

## 2020-07-09 DIAGNOSIS — E1165 Type 2 diabetes mellitus with hyperglycemia: Secondary | ICD-10-CM

## 2020-07-09 NOTE — Patient Instructions (Signed)
Increase tizanidine to 4 mg one twice a day.  Use tramadol twice a day for breakthrough pain.

## 2020-07-09 NOTE — Progress Notes (Signed)
Subjective:  Patient ID: Judy [name], female    DOB: 1960/12/20  Age: 60 y.o. MRN: [id number]  Chief Complaint  Patient presents with  . Diabetes  . Hyperlipidemia    HPI Type 2 diabetes mellitus with hyperglycemia, without long-term current use of insulin (HCC) Takes trulicity 3 mg weekly, farxiga 10 mg daily and gabapentin 400 mg 2 caps bid. Checks feet daily however does not check FBS at home currently.   Hyperlipidemia associated with type 2 diabetes mellitus (HCC) Taking lipitor 20 mg, vascepa 1 g 2 cap bid  Essential hypertension Takes coreg 6.25 mg daily and entresto 24-26 mg daily.  Simple chronic bronchitis (HCC) Uses Stiolto 2 puffs daily  Chronic respiratory failure with hypoxia/OSA: uses cpap and benefits. Uses 2 L of oxygen at night.   Chronic back pain:  Has massages regularly. Has neck pain/lumbar pain. Gabapentin 400 mg 2 twice a day. Helps. Takes tizanidine at night only and tries to limit tramadol to once daily if that. Pt is in constant pain.     Current Outpatient Medications on File Prior to Visit  Medication Sig Dispense Refill  . albuterol (VENTOLIN HFA) 108 (90 Base) MCG/ACT inhaler Inhale 2 puffs into the lungs every 6 (six) hours as needed for wheezing or shortness of breath. (Patient not taking: Reported on 05/13/2020) 8 g 2  . aspirin 81 MG tablet Take 81 mg by mouth at bedtime.     Marland Kitchen atorvastatin (LIPITOR) 20 MG tablet Take 1 tablet (20 mg total) by mouth daily. 90 tablet 0  . buPROPion (WELLBUTRIN XL) 300 MG 24 hr tablet TAKE 1 TABLET EVERY DAY 90 tablet 3  . carvedilol (COREG) 6.25 MG tablet Take 1 tablet (6.25 mg total) by mouth 2 (two) times daily. 180 tablet 3  . clonazePAM (KLONOPIN) 0.5 MG tablet Take 1 tablet (0.5 mg total) by mouth 2 (two) times daily as needed for anxiety (panic attacks). 180 tablet 0  . Coenzyme Q10 (CO Q 10) 100 MG CAPS Take 200 mg by mouth daily.    . diphenhydrAMINE (BENADRYL) 25 MG tablet Take 50 mg by mouth at  bedtime as needed.    . Dulaglutide (TRULICITY) 3 MG/0.5ML SOPN Inject 3 mg as directed once a week. 2 mL 0  . FARXIGA 10 MG TABS tablet Take 1 tablet (10 mg total) by mouth daily. 90 tablet 3  . gabapentin (NEURONTIN) 400 MG capsule TAKE 2 CAPSULES IN THE MORNING AND TAKE 2 CAPSULES IN THE EVENING 360 capsule 3  . icosapent Ethyl (VASCEPA) 1 g capsule Take 2 capsules (2 g total) by mouth 2 (two) times daily. 360 capsule 1  . levothyroxine (SYNTHROID) 75 MCG tablet TAKE 1 TABLET (75 MCG TOTAL) BY MOUTH DAILY. 90 tablet 1  . Lutein 20 MG TABS Take 1 tablet by mouth daily.    . Multiple Vitamin (MULTIVITAMIN WITH MINERALS) TABS tablet Take 1 tablet by mouth daily.    . nirmatrelvir/ritonavir EUA (PAXLOVID) TABS Take 3 tablets by mouth 2 (two) times daily. 30 tablet 0  . OXYGEN 2lpm 2 lpm as needed    . sacubitril-valsartan (ENTRESTO) 24-26 MG Take 1 tablet by mouth 2 (two) times daily. 180 tablet 3  . Tiotropium Bromide-Olodaterol (STIOLTO RESPIMAT) 2.5-2.5 MCG/ACT AERS INHALE 2 PUFFS INTO THE LUNGS DAILY. 12 g 5  . tiZANidine (ZANAFLEX) 4 MG tablet Take 1 tablet (4 mg total) by mouth every 6 (six) hours as needed for muscle spasms. 360 tablet 1  . traMADol (  ULTRAM) 50 MG tablet Take 1 tablet (50 mg total) by mouth 2 (two) times daily. 60 tablet 2  . UNABLE TO FIND CPAP with o2 2lpm  DME- [facility]    . Vitamin D, Ergocalciferol, (DRISDOL) 1.25 MG (50000 UNIT) CAPS capsule TAKE 1 CAPSULE EVERY 7 DAYS 12 capsule 3   No current facility-administered medications on file prior to visit.   Past Medical History:  Diagnosis Date  . Anxiety   . Back pain   . Bilateral swelling of feet   . Chronic combined systolic and diastolic CHF (congestive heart failure) (HCC) 10/07/2014  . COPD (chronic obstructive pulmonary disease) (HCC)   . DCM (dilated cardiomyopathy) (HCC) 04/25/2014   EF 28% by MRI despite maximum medical therapy  . Depression   . Diabetes mellitus without complication (HCC)   . Epilepsy  (HCC)    as a child  . Fatty liver   . Former tobacco use   . Hyperlipidemia   . Hypertension   . Hypothyroidism   . Joint pain   . Leg swelling   . Morbid obesity (HCC)   . NICM (nonischemic cardiomyopathy) (HCC)   . OSA (obstructive sleep apnea)    moderate with AHI 26/hr with oxygen desaturations as low as 70%  . Scoliosis   . Sleep apnea   . SOB (shortness of breath)    Past Surgical History:  Procedure Laterality Date  . ABDOMINAL HYSTERECTOMY    . ankle artery involved cyst removal    . CARDIAC CATHETERIZATION  05/14/2014   normal coronary arteries  . CARPAL TUNNEL RELEASE    . FASCIOTOMY     1993 for platar fasciitis  . harrington rod scoliosis     1981  . LEFT HEART CATHETERIZATION WITH CORONARY ANGIOGRAM N/A 05/14/2014   Procedure: LEFT HEART CATHETERIZATION WITH CORONARY ANGIOGRAM;  Surgeon: Iran Ouch, MD;  Location: [facility];  Service: Cardiovascular;  Laterality: N/A;  . ROTATOR CUFF REPAIR    . UMBILICAL HERNIA REPAIR    . VESICOVAGINAL FISTULA CLOSURE W/ TAH      Family History  Problem Relation Age of Onset  . Emphysema Father   . Allergies Father   . Heart failure Father   . Heart disease Father 38       MI  . Diabetes Father   . Hyperlipidemia Father   . Hypertension Father   . Thyroid disease Father   . Alcohol abuse Father   . Obesity Father   . Diabetes Mother   . Hyperlipidemia Mother   . Obesity Mother    Social History   Socioeconomic History  . Marital status: Married    Spouse name: Not on file  . Number of children: Not on file  . Years of education: Not on file  . Highest education level: Not on file  Occupational History  . Occupation: disabled  Tobacco Use  . Smoking status: Former Smoker    Packs/day: 1.00    Years: 15.00    Pack years: 15.00    Types: Cigarettes    Quit date: 02/14/2005    Years since quitting: 15.4  . Smokeless tobacco: Never Used  Vaping Use  . Vaping Use: Never used  Substance and Sexual  Activity  . Alcohol use: Yes    Comment: occ  . Drug use: No  . Sexual activity: Not on file  Other Topics Concern  . Not on file  Social History Narrative   Lives in [place] with spouse  and son.   Previously worked as a Comptroller         Social Determinants of Community education officer: Not on file  Food Insecurity: No Food Insecurity  . Worried About Programme researcher, broadcasting/film/video in the Last Year: Never true  . Ran Out of Food in the Last Year: Never true  Transportation Needs: No Transportation Needs  . Lack of Transportation (Medical): No  . Lack of Transportation (Non-Medical): No  Physical Activity: Inactive  . Days of Exercise per Week: 0 days  . Minutes of Exercise per Session: 0 min  Stress: Not on file  Social Connections: Not on file    Review of Systems  Constitutional: Negative for chills, fatigue and fever.  HENT: Negative for congestion, ear pain, rhinorrhea and sore throat.   Respiratory: Positive for cough and shortness of breath.   Cardiovascular: Negative for chest pain.  Gastrointestinal: Negative for abdominal pain, constipation, diarrhea, nausea and vomiting.  Genitourinary: Negative for dysuria and urgency.  Musculoskeletal: Negative for back pain and myalgias.  Neurological: Negative for dizziness, weakness, light-headedness and headaches.  Psychiatric/Behavioral: Negative for dysphoric mood. The patient is not nervous/anxious.      Objective:  BP 128/76   Pulse 87   Temp (!) 97.4 F (36.3 C)   Ht 5\' 5"  (1.651 m)   Wt 285 lb (129.3 kg)   SpO2 94%   BMI 47.43 kg/m   BP/Weight 07/09/2020 07/01/2020 05/19/2020  Systolic BP 128 124 110  Diastolic BP 76 80 84  Wt. (Lbs) 285 - 286  BMI 47.43 - 47.59    Physical Exam Vitals reviewed.  Constitutional:      Appearance: Normal appearance. She is obese.  Neck:     Vascular: No carotid bruit.  Cardiovascular:     Rate and Rhythm: Normal rate and regular rhythm.      Pulses: Normal pulses.     Heart sounds: Normal heart sounds.  Pulmonary:     Effort: Pulmonary effort is normal. No respiratory distress.     Breath sounds: Normal breath sounds.  Abdominal:     General: Abdomen is flat. Bowel sounds are normal.     Palpations: Abdomen is soft.     Tenderness: There is no abdominal tenderness.  Neurological:     Mental Status: She is alert and oriented to person, place, and time.  Psychiatric:        Mood and Affect: Mood normal.        Behavior: Behavior normal.     Diabetic Foot Exam - Simple   Simple Foot Form Diabetic Foot exam was performed with the following findings: Yes 07/09/2020 12:59 AM  Visual Inspection No deformities, no ulcerations, no other skin breakdown bilaterally: Yes Sensation Testing Intact to touch and monofilament testing bilaterally: Yes Pulse Check Posterior Tibialis and Dorsalis pulse intact bilaterally: Yes Comments      Lab Results  Component Value Date   WBC 6.6 07/10/2020   HGB 13.8 07/10/2020   HCT 41.6 07/10/2020   PLT 217 07/10/2020   GLUCOSE 120 (H) 07/10/2020   CHOL 135 07/10/2020   TRIG 154 (H) 07/10/2020   HDL 38 (L) 07/10/2020   LDLCALC 70 07/10/2020   ALT 30 07/10/2020   AST 20 07/10/2020   NA 143 07/10/2020   K 4.4 07/10/2020   CL 106 07/10/2020   CREATININE 0.91 07/10/2020   BUN 7 07/10/2020   CO2 23 07/10/2020   TSH 3.070 04/06/2020  INR 1.0 05/13/2014   HGBA1C 6.7 (H) 07/10/2020   MICROALBUR Negative 09/03/2019      Assessment & Plan:   1. Hyperlipidemia associated with type 2 diabetes mellitus (HCC) Patient needs to use free style libre.  Check feet daily.  Eye exam annually.  The current medical regimen is effective;  continue present plan and medications. Recommend continue to work on eating healthy diet and exercise. Returning for fasting labs in am.   2. Essential hypertension Well controlled.  No changes to medicines.  Continue to work on eating a healthy diet and  exercise.  Labs scheduled tomorrow.   3. Simple chronic bronchitis (HCC) The current medical regimen is effective;  continue present plan and medications.  4. Chronic respiratory failure with hypoxia (HCC) Continue to use oxygen at night. She used while had covid 19 last week. Is doing much better.   5. Chronic systolic congestive heart failure (HCC) The current medical regimen is effective;  continue present plan and medications. Entresto has improved EF significanlty.  Management per specialist.   6. Other specified hypothyroidism The current medical regimen is effective;  continue present plan and medications.  7. OSA (obstructive sleep apnea) Continue cpap.  8. Class 3 severe obesity with serious comorbidity and body mass index (BMI) of 45.0 to 49.9 in adult, unspecified obesity type (HCC)  Recommend continue to work on eating healthy diet and exercise.   9. Chronic Back Pain Increase tizanidine to bid.  Increase tramadol to bid if needed.   Follow-up: Return in about 3 months (around 10/09/2020) for fasting.  An After Visit Summary was printed and given to the patient.  Blane Ohara, MD [facility] [phone]

## 2020-07-10 ENCOUNTER — Other Ambulatory Visit: Payer: Self-pay

## 2020-07-10 ENCOUNTER — Other Ambulatory Visit: Payer: Medicare HMO

## 2020-07-10 DIAGNOSIS — E1165 Type 2 diabetes mellitus with hyperglycemia: Secondary | ICD-10-CM | POA: Diagnosis not present

## 2020-07-10 DIAGNOSIS — I10 Essential (primary) hypertension: Secondary | ICD-10-CM

## 2020-07-10 DIAGNOSIS — E785 Hyperlipidemia, unspecified: Secondary | ICD-10-CM | POA: Diagnosis not present

## 2020-07-10 DIAGNOSIS — E1169 Type 2 diabetes mellitus with other specified complication: Secondary | ICD-10-CM

## 2020-07-11 LAB — CBC WITH DIFFERENTIAL/PLATELET
Basophils Absolute: 0.1 10*3/uL (ref 0.0–0.2)
Basos: 1 %
EOS (ABSOLUTE): 0.3 10*3/uL (ref 0.0–0.4)
Eos: 5 %
Hematocrit: 41.6 % (ref 34.0–46.6)
Hemoglobin: 13.8 g/dL (ref 11.1–15.9)
Immature Grans (Abs): 0 10*3/uL (ref 0.0–0.1)
Immature Granulocytes: 1 %
Lymphocytes Absolute: 1.8 10*3/uL (ref 0.7–3.1)
Lymphs: 28 %
MCH: 30.3 pg (ref 26.6–33.0)
MCHC: 33.2 g/dL (ref 31.5–35.7)
MCV: 91 fL (ref 79–97)
Monocytes Absolute: 0.4 10*3/uL (ref 0.1–0.9)
Monocytes: 6 %
Neutrophils Absolute: 4 10*3/uL (ref 1.4–7.0)
Neutrophils: 59 %
Platelets: 217 10*3/uL (ref 150–450)
RBC: 4.55 x10E6/uL (ref 3.77–5.28)
RDW: 12.9 % (ref 11.7–15.4)
WBC: 6.6 10*3/uL (ref 3.4–10.8)

## 2020-07-11 LAB — COMPREHENSIVE METABOLIC PANEL
ALT: 30 IU/L (ref 0–32)
AST: 20 IU/L (ref 0–40)
Albumin/Globulin Ratio: 2.1 (ref 1.2–2.2)
Albumin: 4.1 g/dL (ref 3.8–4.9)
Alkaline Phosphatase: 155 IU/L — ABNORMAL HIGH (ref 44–121)
BUN/Creatinine Ratio: 8 — ABNORMAL LOW (ref 9–23)
BUN: 7 mg/dL (ref 6–24)
Bilirubin Total: 0.5 mg/dL (ref 0.0–1.2)
CO2: 23 mmol/L (ref 20–29)
Calcium: 9.1 mg/dL (ref 8.7–10.2)
Chloride: 106 mmol/L (ref 96–106)
Creatinine, Ser: 0.91 mg/dL (ref 0.57–1.00)
Globulin, Total: 2 g/dL (ref 1.5–4.5)
Glucose: 120 mg/dL — ABNORMAL HIGH (ref 65–99)
Potassium: 4.4 mmol/L (ref 3.5–5.2)
Sodium: 143 mmol/L (ref 134–144)
Total Protein: 6.1 g/dL (ref 6.0–8.5)
eGFR: 73 mL/min/{1.73_m2} (ref >59)

## 2020-07-11 LAB — HEMOGLOBIN A1C
Est. average glucose Bld gHb Est-mCnc: 146 mg/dL
Hgb A1c MFr Bld: 6.7 % — ABNORMAL HIGH (ref 4.8–5.6)

## 2020-07-11 LAB — LIPID PANEL
Chol/HDL Ratio: 3.6 ratio (ref 0.0–4.4)
Cholesterol, Total: 135 mg/dL (ref 100–199)
HDL: 38 mg/dL — ABNORMAL LOW (ref >39)
LDL Chol Calc (NIH): 70 mg/dL (ref 0–99)
Triglycerides: 154 mg/dL — ABNORMAL HIGH (ref 0–149)
VLDL Cholesterol Cal: 27 mg/dL (ref 5–40)

## 2020-07-11 LAB — CARDIOVASCULAR RISK ASSESSMENT

## 2020-07-14 ENCOUNTER — Other Ambulatory Visit: Payer: Self-pay | Admitting: Family Medicine

## 2020-07-14 ENCOUNTER — Other Ambulatory Visit: Payer: Self-pay | Admitting: Physician Assistant

## 2020-07-20 ENCOUNTER — Other Ambulatory Visit: Payer: Self-pay

## 2020-07-20 ENCOUNTER — Encounter: Payer: Self-pay | Admitting: Cardiology

## 2020-07-20 ENCOUNTER — Ambulatory Visit: Payer: Medicare HMO | Admitting: Cardiology

## 2020-07-20 VITALS — BP 110/66 | HR 84 | Ht 65.0 in | Wt 282.6 lb

## 2020-07-20 DIAGNOSIS — I5032 Chronic diastolic (congestive) heart failure: Secondary | ICD-10-CM | POA: Diagnosis not present

## 2020-07-20 DIAGNOSIS — I420 Dilated cardiomyopathy: Secondary | ICD-10-CM | POA: Diagnosis not present

## 2020-07-20 DIAGNOSIS — G4733 Obstructive sleep apnea (adult) (pediatric): Secondary | ICD-10-CM

## 2020-07-20 DIAGNOSIS — I10 Essential (primary) hypertension: Secondary | ICD-10-CM | POA: Diagnosis not present

## 2020-07-20 MED ORDER — ENTRESTO 24-26 MG PO TABS: 1.0000 | ORAL_TABLET | Freq: Two times a day (BID) | ORAL | 3 refills | Status: DC

## 2020-07-20 MED ORDER — CARVEDILOL 6.25 MG PO TABS: 6.2500 mg | ORAL_TABLET | Freq: Two times a day (BID) | ORAL | 3 refills | Status: DC

## 2020-07-20 NOTE — Progress Notes (Signed)
Cardiology Office Note:    Date:  07/20/2020   ID:  Judy [name], DOB 1960-06-24, MRN [id number]  PCP:  Blane Ohara, MD  Cardiologist:  Armanda Magic, MD    Referring MD: Blane Ohara, MD   Chief Complaint  Patient presents with  . Congestive Heart Failure  . Cardiomyopathy  . Hypertension  . Sleep Apnea    History of Present Illness:    Judy [name] is a 60 y.o. female with a hx of NICM, chronic combined CHF,OSA on PAP,COPD, former tobacco abuse, HTN, HLD, morbid obesity. Prior echo 12/2013 showed EF 45-50%. She had abnormal stress test 04/2014 resulting in cardiac cath 05/14/14 showing no significant CAD, EF 30%.   Subsequent echo 08/2014 (poor quality) with EF approximately 35%. She was treated with medical therapy and subsequent cMRI 09/2014 showed diffuse HK, EF 28%, no infiltrative disease. AICD recommended but she had been hesitant to pursue. F/U echo was obtained 07/29/16 showed EF approximately 45-50%, mild LVH, grade 2 DD, mild MR, mildly reduced RV function, PASP .Repeat echo 5/6/2019showed normalization of LV function with EF 55%.  She is here today for followup and is doing well.  She has chronic DOE that she thinks may be worse but this got worse after COVID 19 that she had a few weeks ago.  She denies any chest pain or pressure, PND, orthopnea, LE edema, dizziness, palpitations or syncope. She is compliant with her meds and is tolerating meds with no SE.    She is doing well with her CPAP device and thinks that she has gotten used to it.  She tolerates the mask and feels the pressure is adequate.  Since going on CPAP she feels rested in the am and has no significant daytime sleepiness.  She denies any significant mouth or nasal dryness or nasal congestion.  She does not think that he snores.     Past Medical History:  Diagnosis Date  . Anxiety   . Back pain   . Bilateral swelling of feet   . Chronic combined systolic and diastolic CHF (congestive heart  failure) (HCC) 10/07/2014  . COPD (chronic obstructive pulmonary disease) (HCC)   . DCM (dilated cardiomyopathy) (HCC) 04/25/2014   EF 28% by MRI despite maximum medical therapy  . Depression   . Diabetes mellitus without complication (HCC)   . Epilepsy (HCC)    as a child  . Fatty liver   . Former tobacco use   . Hyperlipidemia   . Hypertension   . Hypothyroidism   . Joint pain   . Leg swelling   . Morbid obesity (HCC)   . NICM (nonischemic cardiomyopathy) (HCC)   . OSA (obstructive sleep apnea)    moderate with AHI 26/hr with oxygen desaturations as low as 70%  . Scoliosis   . Sleep apnea   . SOB (shortness of breath)     Past Surgical History:  Procedure Laterality Date  . ABDOMINAL HYSTERECTOMY    . ankle artery involved cyst removal    . CARDIAC CATHETERIZATION  05/14/2014   normal coronary arteries  . CARPAL TUNNEL RELEASE    . FASCIOTOMY     1993 for platar fasciitis  . harrington rod scoliosis     1981  . LEFT HEART CATHETERIZATION WITH CORONARY ANGIOGRAM N/A 05/14/2014   Procedure: LEFT HEART CATHETERIZATION WITH CORONARY ANGIOGRAM;  Surgeon: Iran Ouch, MD;  Location: [facility];  Service: Cardiovascular;  Laterality: N/A;  . ROTATOR CUFF REPAIR    .  UMBILICAL HERNIA REPAIR    . VESICOVAGINAL FISTULA CLOSURE W/ TAH      Current Medications: Current Meds  Medication Sig  . albuterol (VENTOLIN HFA) 108 (90 Base) MCG/ACT inhaler Inhale 2 puffs into the lungs every 6 (six) hours as needed for wheezing or shortness of breath.  Marland Kitchen aspirin 81 MG tablet Take 81 mg by mouth at bedtime.   Marland Kitchen atorvastatin (LIPITOR) 20 MG tablet Take 1 tablet (20 mg total) by mouth daily.  Marland Kitchen buPROPion (WELLBUTRIN XL) 300 MG 24 hr tablet TAKE 1 TABLET EVERY DAY  . carvedilol (COREG) 6.25 MG tablet Take 1 tablet (6.25 mg total) by mouth 2 (two) times daily.  . clonazePAM (KLONOPIN) 0.5 MG tablet Take 1 tablet (0.5 mg total) by mouth 2 (two) times daily as needed for anxiety (panic  attacks).  . Coenzyme Q10 (CO Q 10) 100 MG CAPS Take 200 mg by mouth daily.  . diphenhydrAMINE (BENADRYL) 25 MG tablet Take 50 mg by mouth at bedtime as needed.  . Dulaglutide (TRULICITY) 3 MG/0.5ML SOPN Inject 3 mg as directed once a week.  Marland Kitchen FARXIGA 10 MG TABS tablet Take 1 tablet (10 mg total) by mouth daily.  Marland Kitchen gabapentin (NEURONTIN) 400 MG capsule TAKE 2 CAPSULES IN THE MORNING AND TAKE 2 CAPSULES IN THE EVENING  . icosapent Ethyl (VASCEPA) 1 g capsule Take 2 capsules (2 g total) by mouth 2 (two) times daily.  Marland Kitchen levothyroxine (SYNTHROID) 75 MCG tablet TAKE 1 TABLET (75 MCG TOTAL) BY MOUTH DAILY.  Marland Kitchen Lutein 20 MG TABS Take 1 tablet by mouth daily.  . Multiple Vitamin (MULTIVITAMIN WITH MINERALS) TABS tablet Take 1 tablet by mouth daily.  . nirmatrelvir/ritonavir EUA (PAXLOVID) TABS Take 3 tablets by mouth 2 (two) times daily.  . OXYGEN 2lpm 2 lpm as needed  . sacubitril-valsartan (ENTRESTO) 24-26 MG Take 1 tablet by mouth 2 (two) times daily.  . Tiotropium Bromide-Olodaterol (STIOLTO RESPIMAT) 2.5-2.5 MCG/ACT AERS INHALE 2 PUFFS INTO THE LUNGS DAILY.  Marland Kitchen tiZANidine (ZANAFLEX) 4 MG tablet TAKE 1 TABLET EVERY 6 HOURS AS NEEDED FOR MUSCLE SPASM(S)  . traMADol (ULTRAM) 50 MG tablet Take 1 tablet (50 mg total) by mouth 2 (two) times daily.  Marland Kitchen UNABLE TO FIND CPAP with o2 2lpm  DME- [facility]  . Vitamin D, Ergocalciferol, (DRISDOL) 1.25 MG (50000 UNIT) CAPS capsule TAKE 1 CAPSULE EVERY 7 DAYS     Allergies:   Demerol [meperidine]   Social History   Socioeconomic History  . Marital status: Married    Spouse name: Not on file  . Number of children: Not on file  . Years of education: Not on file  . Highest education level: Not on file  Occupational History  . Occupation: disabled  Tobacco Use  . Smoking status: Former Smoker    Packs/day: 1.00    Years: 15.00    Pack years: 15.00    Types: Cigarettes    Quit date: 02/14/2005    Years since quitting: 15.4  . Smokeless tobacco: Never Used   Vaping Use  . Vaping Use: Never used  Substance and Sexual Activity  . Alcohol use: Yes    Comment: occ  . Drug use: No  . Sexual activity: Not on file  Other Topics Concern  . Not on file  Social History Narrative   Lives in [place] with spouse and son.   Previously worked as a Comptroller         Social Determinants of Health  Financial Resource Strain: Not on file  Food Insecurity: No Food Insecurity  . Worried About Programme researcher, broadcasting/film/video in the Last Year: Never true  . Ran Out of Food in the Last Year: Never true  Transportation Needs: No Transportation Needs  . Lack of Transportation (Medical): No  . Lack of Transportation (Non-Medical): No  Physical Activity: Inactive  . Days of Exercise per Week: 0 days  . Minutes of Exercise per Session: 0 min  Stress: Not on file  Social Connections: Not on file     Family History: The patient's family history includes Alcohol abuse in her father; Allergies in her father; Diabetes in her father and mother; Emphysema in her father; Heart disease (age of onset: 29) in her father; Heart failure in her father; Hyperlipidemia in her father and mother; Hypertension in her father; Obesity in her father and mother; Thyroid disease in her father.  ROS:   Please see the history of present illness.    ROS  All other systems reviewed and negative.   EKGs/Labs/Other Studies Reviewed:    The following studies were reviewed today: none  EKG:  EKG is not ordered today.   Recent Labs: 04/06/2020: TSH 3.070 07/10/2020: ALT 30; BUN 7; Creatinine, Ser 0.91; Hemoglobin 13.8; Platelets 217; Potassium 4.4; Sodium 143   Recent Lipid Panel    Component Value Date/Time   CHOL 135 07/10/2020 1129   TRIG 154 (H) 07/10/2020 1129   HDL 38 (L) 07/10/2020 1129   CHOLHDL 3.6 07/10/2020 1129   CHOLHDL 3.6 07/12/2016 1115   VLDL 30 07/12/2016 1115   LDLCALC 70 07/10/2020 1129     Physical Exam:    VS:  BP 110/66   Pulse 84    Ht 5\' 5"  (1.651 m)   Wt 282 lb 9.6 oz (128.2 kg)   SpO2 96%   BMI 47.03 kg/m     Wt Readings from Last 3 Encounters:  07/20/20 282 lb 9.6 oz (128.2 kg)  07/09/20 285 lb (129.3 kg)  05/19/20 286 lb (129.7 kg)     GEN:  Well nourished, well developed in no acute distress HEENT: Normal NECK: No JVD; No carotid bruits LYMPHATICS: No lymphadenopathy CARDIAC: RRR, no murmurs, rubs, gallops RESPIRATORY:  Clear to auscultation without rales, wheezing or rhonchi  ABDOMEN: Soft, non-tender, non-distended MUSCULOSKELETAL:  No edema; No deformity  SKIN: Warm and dry NEUROLOGIC:  Alert and oriented x 3 PSYCHIATRIC:  Normal affect   ASSESSMENT:    1. Chronic diastolic congestive heart failure (HCC)   2. DCM (dilated cardiomyopathy) (HCC)   3. Essential hypertension   4. OSA (obstructive sleep apnea)    PLAN:    In order of problems listed above: 1. Chronic diastolic CHF -she is currently NYHA class 2b -she has chronic DOE which is multifactorial from CHF as well as morbid obesity>> LE edema is stable and unchanged from when I saw her last -she has had worsening SOB since having COVID a few weeks ago and is seeing pulmonary today -She sees pulmonary for her COPD -Continue prescription drug management with Lasix PRN -Continue prescription drug management with Glori Bickers 10mg  daily, Carvedilol 6.25mg  BID and Entresto 24-26mg  BID  2. Nonischemic DCM -essentially normal coronary arteries by cath 2016 -DCM resolved with EF 55% on echo 2018 -carvedilol was stopped due to hypotension but then restarted at a lower dose due to tachycardia -Continue prescription drug management with Carvedilol 6.25mg  BID and Entresto 24-26mg  BID  -I have personally reviewed and interpreted  outside labs performed by patient's PCP which showed SCr 0.910, TSH 3.07 and K+ 4.4 in may 2022  3. HTN -BP is adequately controlled on exam today -Continue Carvedilol and Entresto  4. OSA - The patient is tolerating  PAP therapy well without any problems. The PAP download performed by his DME was personally reviewed and interpreted by me today and showed an AHI of 0.8/hr on 13 cm H2O with 97% compliance in using more than 4 hours nightly.  The patient has been using and benefiting from PAP use and will continue to benefit from therapy.   Time Spent: 25 minutes total time of encounter, including 15 minutes spent in face-to-face patient care on the date of this encounter. This time includes coordination of care and counseling regarding above mentioned problem list. Remainder of non-face-to-face time involved reviewing chart documents/testing relevant to the patient encounter and documentation in the medical record. I have independently reviewed documentation from referring provider  Medication Adjustments/Labs and Tests Ordered: Current medicines are reviewed at length with the patient today.  Concerns regarding medicines are outlined above.  No orders of the defined types were placed in this encounter.  No orders of the defined types were placed in this encounter.   Signed, Armanda Magic, MD  07/20/2020 11:35 AM    [facility]

## 2020-07-20 NOTE — Patient Instructions (Signed)

## 2020-07-20 NOTE — Addendum Note (Signed)
Addended by: Theresia Majors on: 07/20/2020 11:42 AM   Modules accepted: Orders

## 2020-07-22 ENCOUNTER — Other Ambulatory Visit: Payer: Self-pay

## 2020-07-22 ENCOUNTER — Ambulatory Visit: Payer: Medicare HMO | Admitting: Internal Medicine

## 2020-07-22 ENCOUNTER — Encounter: Payer: Self-pay | Admitting: Internal Medicine

## 2020-07-22 ENCOUNTER — Ambulatory Visit (INDEPENDENT_AMBULATORY_CARE_PROVIDER_SITE_OTHER): Payer: Medicare HMO

## 2020-07-22 DIAGNOSIS — R0609 Other forms of dyspnea: Secondary | ICD-10-CM

## 2020-07-22 DIAGNOSIS — J9611 Chronic respiratory failure with hypoxia: Secondary | ICD-10-CM

## 2020-07-22 DIAGNOSIS — R0600 Dyspnea, unspecified: Secondary | ICD-10-CM

## 2020-07-22 DIAGNOSIS — J449 Chronic obstructive pulmonary disease, unspecified: Secondary | ICD-10-CM

## 2020-07-22 DIAGNOSIS — R0602 Shortness of breath: Secondary | ICD-10-CM | POA: Diagnosis not present

## 2020-07-22 LAB — D-DIMER, QUANTITATIVE: D-Dimer, Quant: 0.46 mcg/mL FEU (ref <0.50)

## 2020-07-22 LAB — BRAIN NATRIURETIC PEPTIDE: Pro B Natriuretic peptide (BNP): 13 pg/mL (ref 0.0–100.0)

## 2020-07-22 LAB — SEDIMENTATION RATE: Sed Rate: 10 mm/hr (ref 0–30)

## 2020-07-22 NOTE — Progress Notes (Signed)
Subjective:     Patient ID: Judy [name], female   DOB: 1960/06/26,  MRN: [id number]    Brief patient profile:  60 yowf quit smoking 2007  @  150 lb  with cough that resolved and no resp problems until winter 2015 with doe x walking the dog s much in terms of cough then 10/04/13 sudden sense she couldn't get a breath and coughed up blood x one tsp plus slt green mucus  > better with saba and started on inhalers/ abx  >  better and pred taper and dx of GOLD II copd was made 11/2013 and non ischemic cardiomyopathy 05/14/14.     History of Present Illness  10/08/2013 1st [facility] office visit/ Janifer Gieselman Chief Complaint  Patient presents with   Pulmonary Consult    Referred by Penni Homans Cox-sob with exertion x 6-8 mths.,worse since 4 days ago,occass. cough-tsp. of blood 4 days ago,usually unprod.,no wheezing,midchest tightness,no fcs,Had neb. since yesterday(used 2x yesterday)   baseline = 171ft   to mailbox and sob when gets there.  rec Clonidine 0.1 mg twice daily until you return  Plan A = automatic = symbiocort 160 Take 2 puffs first thing in am and then another 2 puffs about 12 hours later.  Plan B = Only use your albuterol (proair)as a rescue medication    Plan C = nebulizer albuterol, ok to use up to every 4 hours if can't get relief from Plan B GERD diet     LHC 05/14/14  1. No significant coronary artery disease  2. Moderate to severely reduced LV systolic function due to nonischemic cardiomyopathy.    3. Moderately elevated left ventricular end-diastolic pressure.    07/18/2017 acute extended ov/Aurel Nguyen re: re-establish for sob  Chief Complaint  Patient presents with   Acute Visit    Increased SOB x 6-8 wks.  She states she is getting SOB just walking from room to room at home.   All started p abruptly became  light headed about 6 wk prior to OV  > bp was low > adjustments made to bp meds (decrease lasix and coreg) then sob worse s cough and now doe = MMRC4  = sob if tries to  leave home or while getting dressed some better when wears 02 but not monitoring daytime sats   02 is 2lpm with cpap and prn daytime  no noct symtpoms on cpap flat > feels rested rec Goal is to keep 02 sats over 90% at all times so take it with activity if needed and turn 02 up to achieve this goal  Continue lasix as needed to control feet swelling  Please see patient coordinator before you leave today  to schedule overnight  Oxymetry on 02 an cpap Please remember to go to the  x-ray department downstairs in the basement  for your tests - we will call you with the results when they are available.  Please schedule a follow up office visit in 6 weeks, call sooner if needed  - add stiolto 2 pffs each am     08/29/2017  f/u ov/Sang Blount re:  GOLD III/ 02 dep hs with cpap and  ? With exertion  Chief Complaint  Patient presents with   Follow-up    Breathing is some better but not back to baseline.  She has had sore throat and increased cough for the past few days. Cough has been non prod.    Dyspnea:  MMRC3 = some better but still  can't  walk 100 yards even at a slow pace at a flat grade s stopping due to sob but admits not good about checking sats when walking    Cough: dry day > noct new x sev months assoc with hoarseness not post nasal drip/ wheeze  SABA use: none 02: 2lpm at hs and prn daytime  rec Try prilosec otc 20mg   Take 30-60 min before first meal of the day and Pepcid ac (famotidine) 20 mg one @  bedtime until cough is completely gone for at least a week   Adjust 02 to sats > 90% at all times especially while walking to help you burn fat and get into negative balance      12/04/2017  f/u ov/Tierney Behl re:  GOLD III / 02 dep hs at 2lpm and 2lpm with ex bike Chief Complaint  Patient presents with   Follow-up    COPD GOLD III  Dyspnea: ex bike  3 x weekly x 10 min s stopping and w/in 5 min needs 02 if not starting out with it  Cough: resolved  Sleeping: bed is flat/ L side down/ one  pillow/ cpap per turner  SABA use: none on stiolto 2 each am  02: as above   rec Work on maintaining perfect  inhaler technique  No change in medications Adjust your 02 upwards as needed to maintain 02 sats well above 90% while exercising      07/08/2019  f/u ov/Caitlin Ainley re:  COPD III/ 02 hs only/ maint stiolto Chief Complaint  Patient presents with   Follow-up    Breathing is unchanged. No new co's today.   Dyspnea:  Room to room  Cough: none Sleeping: on side / one pillow SABA use: no need while on stiolto 02: 2lpm hs only  rec No change   07/22/2020 acute extended ov/Isabelle Matt re:  Sob p covid Chief Complaint  Patient presents with   Shortness of Breath    Reports +covid test on 01 Jul 2020 and started medication on 02 Jul 2020 with PCP. Says improvement since but still has dyspnea  Onset May 16th 2022 p 3 vaccinations = ST / HA/ sluggish/ no cough stuffy nose/ no energy then Pos Covid 19 rx paxlovid x 5 days >  99% in every regard x for doe / not using 02  Cough not a problem Sleep flat/ cpap and feels rested  02 2lpm hs not using during the day     No obvious day to day or daytime variability or assoc excess/ purulent sputum or mucus plugs or hemoptysis or cp or chest tightness, subjective wheeze or overt sinus or hb symptoms.   Sleeping as above without nocturnal  or early am exacerbation  of respiratory  c/o's or need for noct saba. Also denies any obvious fluctuation of symptoms with weather or environmental changes or other aggravating or alleviating factors except as outlined above   No unusual exposure hx or h/o childhood pna/ asthma or knowledge of premature birth.  Current Allergies, Complete Past Medical History, Past Surgical History, Family History, and Social History were reviewed in [employer] record.  ROS  The following are not active complaints unless bolded Hoarseness, sore throat, dysphagia, dental problems, itching, sneezing,  nasal  congestion or discharge of excess mucus or purulent secretions, ear ache,   fever, chills, sweats, unintended wt loss or wt gain, classically pleuritic or exertional cp,  orthopnea pnd or arm/hand swelling  or leg swelling, presyncope, palpitations, abdominal pain, anorexia,  nausea, vomiting, diarrhea  or change in bowel habits or change in bladder habits, change in stools or change in urine, dysuria, hematuria,  rash, arthralgias, visual complaints, headache, numbness, weakness or ataxia or problems with walking or coordination,  change in mood or  memory.        Current Meds  Medication Sig   albuterol (VENTOLIN HFA) 108 (90 Base) MCG/ACT inhaler Inhale 2 puffs into the lungs every 6 (six) hours as needed for wheezing or shortness of breath.   aspirin 81 MG tablet Take 81 mg by mouth at bedtime.    atorvastatin (LIPITOR) 20 MG tablet Take 1 tablet (20 mg total) by mouth daily.   buPROPion (WELLBUTRIN XL) 300 MG 24 hr tablet TAKE 1 TABLET EVERY DAY   carvedilol (COREG) 6.25 MG tablet Take 1 tablet (6.25 mg total) by mouth 2 (two) times daily.   clonazePAM (KLONOPIN) 0.5 MG tablet Take 1 tablet (0.5 mg total) by mouth 2 (two) times daily as needed for anxiety (panic attacks).   Coenzyme Q10 (CO Q 10) 100 MG CAPS Take 200 mg by mouth daily.   diphenhydrAMINE (BENADRYL) 25 MG tablet Take 50 mg by mouth at bedtime as needed.   Dulaglutide (TRULICITY) 3 MG/0.5ML SOPN Inject 3 mg as directed once a week.   FARXIGA 10 MG TABS tablet Take 1 tablet (10 mg total) by mouth daily.   gabapentin (NEURONTIN) 400 MG capsule TAKE 2 CAPSULES IN THE MORNING AND TAKE 2 CAPSULES IN THE EVENING   icosapent Ethyl (VASCEPA) 1 g capsule Take 2 capsules (2 g total) by mouth 2 (two) times daily.   levothyroxine (SYNTHROID) 75 MCG tablet TAKE 1 TABLET (75 MCG TOTAL) BY MOUTH DAILY.   Lutein 20 MG TABS Take 1 tablet by mouth daily.   Multiple Vitamin (MULTIVITAMIN WITH MINERALS) TABS tablet Take 1 tablet by mouth daily.    nirmatrelvir/ritonavir EUA (PAXLOVID) TABS Take 3 tablets by mouth 2 (two) times daily.   OXYGEN 2lpm 2 lpm as needed   sacubitril-valsartan (ENTRESTO) 24-26 MG Take 1 tablet by mouth 2 (two) times daily.   Tiotropium Bromide-Olodaterol (STIOLTO RESPIMAT) 2.5-2.5 MCG/ACT AERS INHALE 2 PUFFS INTO THE LUNGS DAILY.   tiZANidine (ZANAFLEX) 4 MG tablet TAKE 1 TABLET EVERY 6 HOURS AS NEEDED FOR MUSCLE SPASM(S)   traMADol (ULTRAM) 50 MG tablet Take 1 tablet (50 mg total) by mouth 2 (two) times daily.   UNABLE TO FIND CPAP with o2 2lpm  DME- [facility]   Vitamin D, Ergocalciferol, (DRISDOL) 1.25 MG (50000 UNIT) CAPS capsule TAKE 1 CAPSULE EVERY 7 DAYS                   Objective:   Physical Exam     07/22/2020       284 07/08/2019     295  12/31/2018   293  11/27/2013     237 >   01/08/2014  236 > 01/21/2014 241 >235 02/21/2014 > 02/26/2014  232 > 03/27/2014  236 >236 04/10/2014 > 05/22/2014   237 >  08/21/2014 245 > 10/09/2014 257 >  01/12/2015   268 > 07/14/2015  278 > 01/14/2016  292  > 07/18/2017   301  > 08/29/2017  303 > 12/04/2017  298 > 06/26/2018  291   Vital signs reviewed  07/22/2020  - Note at rest 02 sats  95% on RA   General appearance:    Obese amb  wf nad   HEENT : pt wearing mask not  removed for exam due to covid - 19 concerns.    NECK :  without JVD/Nodes/TM/ nl carotid upstrokes bilaterally   LUNGS: no acc muscle use,  Mild barrel  contour chest wall with bilateral  Distant bs s audible wheeze and  without cough on insp or exp maneuvers  and mild  Hyperresonant  to  percussion bilaterally     CV:  RRR  no s3 or murmur or increase in P2, and no edema   ABD: obese  soft and nontender with pos end  insp Hoover's  in the supine position. No bruits or organomegaly appreciated, bowel sounds nl  MS:   Nl gait/  ext warm without deformities, calf tenderness, cyanosis or clubbing No obvious joint restrictions   SKIN: warm and dry without lesions    NEURO:  alert, approp, nl sensorium with  no  motor or cerebellar deficits apparent.       CXR PA and Lateral:   07/22/2020 :    I personally reviewed images and agree with radiology impression as follows:  No active cardiopulmonary disease.         Labs ordered/ reviewed:      Chemistry      Component Value Date/Time   NA 143 07/10/2020 1129   K 4.4 07/10/2020 1129   CL 106 07/10/2020 1129   CO2 23 07/10/2020 1129   BUN 7 07/10/2020 1129   CREATININE 0.91 07/10/2020 1129   CREATININE 0.75 12/09/2015 1057      Component Value Date/Time   CALCIUM 9.1 07/10/2020 1129   ALKPHOS 155 (H) 07/10/2020 1129   AST 20 07/10/2020 1129   ALT 30 07/10/2020 1129   BILITOT 0.5 07/10/2020 1129        Lab Results  Component Value Date   WBC 6.6 07/10/2020   HGB 13.8 07/10/2020   HCT 41.6 07/10/2020   MCV 91 07/10/2020   PLT 217 07/10/2020     Lab Results  Component Value Date   DDIMER 0.46 07/22/2020      Lab Results  Component Value Date   TSH 3.070 04/06/2020     Lab Results  Component Value Date   PROBNP 46 06/12/2017       Lab Results  Component Value Date   ESRSEDRATE 36 (H) 04/10/2014   ESRSEDRATE 35 (H) 01/08/2014   ESRSEDRATE 18 10/08/2013          Assessment:

## 2020-07-22 NOTE — Patient Instructions (Signed)
To get the most out of exercise, you need to be continuously aware that you are short of breath, but never out of breath, for at least 30 minutes daily. As you improve, it will actually be easier for you to do the same amount of exercise  in  30 minutes so always push to the level where you are short of breath.  Once you can do this, push for longer duration or repeat it after at least 4 hours of rest.  Make sure you check your oxygen saturations at highest level of activity and keep it above 90%    Please remember to go to the lab and x-ray department  for your tests - we will call you with the results when they are available.       Please schedule a follow up office visit in 6 weeks, call sooner if needed

## 2020-07-22 NOTE — Assessment & Plan Note (Signed)
Quit smoking around 2007  - 11/27/2013  PFTs   FEV1  1.51 (54%) ratio 52 and and no better p B2 and DLCO  71 - 01/08/2014  p extensive coaching HFA effectiveness =    90% > rec resume symbicort 160 2bid  - 03/27/14 trial of dulera 200/tudorza samples only to assure adherence  04/10/2014   Alpha 1 MM , nml level  - trial off spiriva 05/22/14 / off all resp rx 08/22/14 - PFT's  10/09/2014  FEV1 1.64 (59 % ) ratio 54  p 11 % improvement from saba with DLCO  69 % corrects to 73  % for alv volume   - 07/14/2015  A  > try spiriva 2 puffs each am > no better so pt d/c'd  - PFT's  07/18/2017  FEV1 1.10 (41 % ) ratio 46   p 19 % improvement from saba p nothing prior to study with DLCO  70 % corrects to 75  % for alv volume  On coreg  - 07/18/2017    try stiolto  - Spirometry 12/04/2017  FEV1 1.3 (49%)  Ratio 49 p am stiolto x 2 puffs  w classic curvature - 06/26/2018  After extensive coaching inhaler device,  effectiveness =   90%    Pt is Group B in terms of symptom/risk and laba/lama therefore appropriate rx at this point >>>  Continue stiolto

## 2020-07-22 NOTE — Assessment & Plan Note (Addendum)
Body mass index is 47.26 kg/m.  -  trending down, encouraged  Lab Results  Component Value Date   TSH 3.070 04/06/2020     Contributing to gerd risk/ doe/reviewed the need and the process to achieve and maintain neg calorie balance > defer f/u primary care including intermittently monitoring thyroid status    Medical decision making was a moderate level of complexity in this case because of  >  two chronic conditions /diagnoses and new post covid eval  requiring extra time for  H and P, chart review, counseling  and generating customized AVS unique to this office visit and charting.   Each maintenance medication was reviewed in detail including emphasizing most importantly the difference between maintenance and prns and under what circumstances the prns are to be triggered using an action plan format where appropriate. Please see avs for details which were reviewed in writing by both me and my nurse and patient given a written copy highlighted where appropriate with yellow highlighter for the patient's continued care at home along with an updated version of their medications.  Patient was asked to maintain medication reconciliation by comparing this list to the actual medications being used at home and to contact this office right away if there is a conflict or discrepancy.

## 2020-07-22 NOTE — Assessment & Plan Note (Signed)
Walk with desats 88% 04/10/2014 >begin O2 w/ act 2 l/m  -07/14/2015  Walked RA x 3 laps @ 185 ft each stopped due to end of study, nl pace, no significant desat or sob. > hs 02 only  - ono on 2lpm/cpap  07/20/2017   desats < 89% x 2.4 min  Advised 2lpm and Make sure you check your oxygen saturation  at your highest level of activity  to be sure it stays over 90% and adjust  02 flow upward to maintain this level if needed but remember to turn it back to previous settings when you stop (to conserve your supply).

## 2020-07-22 NOTE — Assessment & Plan Note (Addendum)
-   10/08/2013  Walked RA x 2 laps @ 185 ft each stopped due to  Sob and back pain, no desat  - 01/08/2014   Walked RA x one lap @ 185 stopped due to  Sob with desat 89%  - CTa chest 01/08/2014 > new bilateral effusions/ non specific gg changes  - Echo 01/08/2014 > ef 45% with mild MR - 01/21/2014   Walked RA x one lap @ 185 stopped due to  Back gave out, slow pace, no desat  - 01/12/2015   Walked RA  2 laps @ 185 ft each stopped due to  Sob/ slow pace, lowest sat was 89%  Jun 29 2020 onset covid symptoms, postive on the 18th rx paxlovid - 07/22/2020 attempted ambulation, did not bring walker  Flare of chronic doe p covid mid may 2022 s evidence of complications eg pna/ pe, chf or AB so no need for any intervention at this point s/p paxlovid rx.

## 2020-07-23 ENCOUNTER — Encounter: Payer: Self-pay | Admitting: *Deleted

## 2020-07-23 NOTE — Progress Notes (Signed)
Letter mailed

## 2020-07-24 ENCOUNTER — Encounter: Payer: Self-pay | Admitting: *Deleted

## 2020-07-28 ENCOUNTER — Encounter: Payer: Self-pay | Admitting: Internal Medicine

## 2020-07-30 DIAGNOSIS — G4733 Obstructive sleep apnea (adult) (pediatric): Secondary | ICD-10-CM | POA: Diagnosis not present

## 2020-08-01 DIAGNOSIS — G4733 Obstructive sleep apnea (adult) (pediatric): Secondary | ICD-10-CM | POA: Diagnosis not present

## 2020-08-05 NOTE — Progress Notes (Signed)
Acute Office Visit  Subjective:    Patient ID: Judy [name], female    DOB: 12/01/1960, 60 y.o.   MRN: [id number]  Chief Complaint  Patient presents with   Abnormal Mole    HPI Patient is in today for evaluation of skin lesion to right upper chest. States lesion is pruritic. Unknown how long lesion has been present. Family history of skin cancer with multiple members.   Past Medical History:  Diagnosis Date   Anxiety    Back pain    Bilateral swelling of feet    Chronic combined systolic and diastolic CHF (congestive heart failure) ([place]) 10/07/2014   COPD (chronic obstructive pulmonary disease) ([facility])    DCM (dilated cardiomyopathy) ([place]) 04/25/2014   EF 28% by MRI despite maximum medical therapy   Depression    Diabetes mellitus without complication ([place])    Epilepsy ([place])    as a child   Fatty liver    Former tobacco use    Hyperlipidemia    Hypertension    Hypothyroidism    Joint pain    Leg swelling    Morbid obesity (H[facility])    NICM (nonischemic cardiomyopathy) (HCC)    OSA (obstructive sleep apnea)    moderate with AHI 26/hr with oxygen desaturations as low as 70%   Scoliosis    Sleep apnea    SOB (shortness of breath)     Past Surgical History:  Procedure Laterality Date   ABDOMINAL HYSTERECTOMY     ankle artery involved cyst removal     CARDIAC CATHETERIZATION  05/14/2014   normal coronary arteries   CARPAL TUNNEL RELEASE     FASCIOTOMY     1993 for platar fasciitis   harrington rod scoliosis     1981   LEFT HEART CATHETERIZATION WITH CORONARY ANGIOGRAM N/A 05/14/2014   Procedure: LEFT HEART CATHETERIZATION WITH CORONARY ANGIOGRAM;  Surgeon: Wellington Hampshire, MD;  Location: [facility];  Service: Cardiovascular;  Laterality: N/A;   ROTATOR CUFF REPAIR     UMBILICAL HERNIA REPAIR     VESICOVAGINAL FISTULA CLOSURE W/ TAH      Family History  Problem Relation Age of Onset   Emphysema Father    Allergies Father    Heart failure Father    Heart  disease Father 41       MI   Diabetes Father    Hyperlipidemia Father    Hypertension Father    Thyroid disease Father    Alcohol abuse Father    Obesity Father    Diabetes Mother    Hyperlipidemia Mother    Obesity Mother     Social History   Socioeconomic History   Marital status: Married    Spouse name: Not on file   Number of children: Not on file   Years of education: Not on file   Highest education level: Not on file  Occupational History   Occupation: disabled  Tobacco Use   Smoking status: Former    Packs/day: 1.00    Years: 15.00    Pack years: 15.00    Types: Cigarettes    Quit date: 02/14/2005    Years since quitting: 15.4   Smokeless tobacco: Never  Vaping Use   Vaping Use: Never used  Substance and Sexual Activity   Alcohol use: Yes    Comment: occ   Drug use: No   Sexual activity: Not on file  Other Topics Concern   Not on file  Social History Narrative  Lives in [place] with spouse and son.   Previously worked as a Engineer, manufacturing         Social Determinants of Sales executive: Not on file  Food Insecurity: No Food Insecurity   Worried About Charity fundraiser in the Last Year: Never true   Arboriculturist in the Last Year: Never true  Transportation Needs: No Transportation Needs   Lack of Transportation (Medical): No   Lack of Transportation (Non-Medical): No  Physical Activity: Inactive   Days of Exercise per Week: 0 days   Minutes of Exercise per Session: 0 min  Stress: Not on file  Social Connections: Not on file  Intimate Partner Violence: Not on file    Outpatient Medications Prior to Visit  Medication Sig Dispense Refill   albuterol (VENTOLIN HFA) 108 (90 Base) MCG/ACT inhaler Inhale 2 puffs into the lungs every 6 (six) hours as needed for wheezing or shortness of breath. 8 g 2   aspirin 81 MG tablet Take 81 mg by mouth at bedtime.      atorvastatin (LIPITOR) 20 MG tablet Take 1 tablet (20 mg  total) by mouth daily. 90 tablet 0   buPROPion (WELLBUTRIN XL) 300 MG 24 hr tablet TAKE 1 TABLET EVERY DAY 90 tablet 3   carvedilol (COREG) 6.25 MG tablet Take 1 tablet (6.25 mg total) by mouth 2 (two) times daily. 180 tablet 3   clonazePAM (KLONOPIN) 0.5 MG tablet Take 1 tablet (0.5 mg total) by mouth 2 (two) times daily as needed for anxiety (panic attacks). 180 tablet 0   Coenzyme Q10 (CO Q 10) 100 MG CAPS Take 200 mg by mouth daily.     diphenhydrAMINE (BENADRYL) 25 MG tablet Take 50 mg by mouth at bedtime as needed.     Dulaglutide (TRULICITY) 3 MA/2.6JF SOPN Inject 3 mg as directed once a week. 2 mL 0   FARXIGA 10 MG TABS tablet Take 1 tablet (10 mg total) by mouth daily. 90 tablet 3   gabapentin (NEURONTIN) 400 MG capsule TAKE 2 CAPSULES IN THE MORNING AND TAKE 2 CAPSULES IN THE EVENING 360 capsule 3   icosapent Ethyl (VASCEPA) 1 g capsule Take 2 capsules (2 g total) by mouth 2 (two) times daily. 360 capsule 1   levothyroxine (SYNTHROID) 75 MCG tablet TAKE 1 TABLET (75 MCG TOTAL) BY MOUTH DAILY. 90 tablet 1   Lutein 20 MG TABS Take 1 tablet by mouth daily.     Multiple Vitamin (MULTIVITAMIN WITH MINERALS) TABS tablet Take 1 tablet by mouth daily.     nirmatrelvir/ritonavir EUA (PAXLOVID) TABS Take 3 tablets by mouth 2 (two) times daily. 30 tablet 0   OXYGEN 2lpm 2 lpm as needed     sacubitril-valsartan (ENTRESTO) 24-26 MG Take 1 tablet by mouth 2 (two) times daily. 180 tablet 3   Tiotropium Bromide-Olodaterol (STIOLTO RESPIMAT) 2.5-2.5 MCG/ACT AERS INHALE 2 PUFFS INTO THE LUNGS DAILY. 12 g 5   tiZANidine (ZANAFLEX) 4 MG tablet TAKE 1 TABLET EVERY 6 HOURS AS NEEDED FOR MUSCLE SPASM(S) 360 tablet 1   traMADol (ULTRAM) 50 MG tablet Take 1 tablet (50 mg total) by mouth 2 (two) times daily. 60 tablet 2   UNABLE TO FIND CPAP with o2 2lpm  DME- [facility]     Vitamin D, Ergocalciferol, (DRISDOL) 1.25 MG (50000 UNIT) CAPS capsule TAKE 1 CAPSULE EVERY 7 DAYS 12 capsule 3   No facility-administered  medications prior to visit.  Allergies  Allergen Reactions   Demerol [Meperidine] Nausea And Vomiting    Review of Systems  HENT: Negative.    Respiratory:  Positive for shortness of breath (chronic).   Endocrine: Negative for heat intolerance.  Skin:        Skin lesion right upper chest  Allergic/Immunologic: Positive for environmental allergies (chronic rhinitis).  Neurological:  Positive for weakness (ambulates with cane).  Hematological: Negative.   Psychiatric/Behavioral: Negative.    All other systems reviewed and are negative.     Objective:    Physical Exam Vitals reviewed.  Constitutional:      Appearance: Normal appearance.  Pulmonary:     Effort: Pulmonary effort is normal.  Abdominal:     Tenderness: There is no abdominal tenderness.  Skin:    General: Skin is warm and dry.     Capillary Refill: Capillary refill takes less than 2 seconds.     Findings: Lesion present.          Comments: Flesh colored skin lesion to right upper chest approximately 2 mm in diameter with scant dry flaking skin.  Approximately 3 mm in diameter, raised flesh colored oval-shaped skin tag to left lower back with small dark brown/black dot in center; tender with palpation  Neurological:     General: No focal deficit present.     Mental Status: She is alert and oriented to person, place, and time.  Psychiatric:        Mood and Affect: Mood normal.        Behavior: Behavior normal.    BP 98/62 (BP Location: Right Arm, Patient Position: Sitting)   Temp (!) 97.5 F (36.4 C) (Temporal)   Ht '5\' 5"'  (1.651 m)   Wt 281 lb (127.5 kg)   SpO2 92%   BMI 46.76 kg/m  Wt Readings from Last 3 Encounters:  08/06/20 281 lb (127.5 kg)  07/22/20 284 lb (128.8 kg)  07/20/20 282 lb 9.6 oz (128.2 kg)    Health Maintenance Due  Topic Date Due   Pneumococcal Vaccine 39-63 Years old (1 - PCV) Never done   Zoster Vaccines- Shingrix (1 of 2) Never done   COVID-19 Vaccine (4 - Booster for  Pfizer series) 02/13/2020    Lab Results  Component Value Date   TSH 3.070 04/06/2020   Lab Results  Component Value Date   WBC 6.6 07/10/2020   HGB 13.8 07/10/2020   HCT 41.6 07/10/2020   MCV 91 07/10/2020   PLT 217 07/10/2020   Lab Results  Component Value Date   NA 143 07/10/2020   K 4.4 07/10/2020   CO2 23 07/10/2020   GLUCOSE 120 (H) 07/10/2020   BUN 7 07/10/2020   CREATININE 0.91 07/10/2020   BILITOT 0.5 07/10/2020   ALKPHOS 155 (H) 07/10/2020   AST 20 07/10/2020   ALT 30 07/10/2020   PROT 6.1 07/10/2020   ALBUMIN 4.1 07/10/2020   CALCIUM 9.1 07/10/2020   ANIONGAP 13 09/24/2018   EGFR 73 07/10/2020   GFR 100.87 10/01/2014   Lab Results  Component Value Date   CHOL 135 07/10/2020   Lab Results  Component Value Date   HDL 38 (L) 07/10/2020   Lab Results  Component Value Date   LDLCALC 70 07/10/2020   Lab Results  Component Value Date   TRIG 154 (H) 07/10/2020   Lab Results  Component Value Date   CHOLHDL 3.6 07/10/2020   Lab Results  Component Value Date   HGBA1C 6.7 (H) 07/10/2020  Assessment & Plan:   1. Skin lesion - Ambulatory referral to Dermatology  2. Family history of skin cancer - Ambulatory referral to Dermatology    We will call you with referral to dermatologist Follow-up as needed   I spent 7 minutes dedicated to the care of this patient on the date of this encounter to include face-to-face time with the patient, as well as: EMR and specialist referral   Follow-up: PRN  An After Visit Summary was printed and given to the patient.   I, Fonnie Mu as a scribe for Rip Harbour, NP.,have documented all relevant documentation on the behalf of Rip Harbour, NP,as directed by  Rip Harbour, NP while in the presence of Rip Harbour, NP.   I, Rip Harbour, NP, have reviewed all documentation for this visit. The documentation on 08/06/20 for the exam, diagnosis, procedures, and orders are all  accurate and complete.   Rip Harbour, NP [place] [phone]

## 2020-08-06 ENCOUNTER — Encounter: Payer: Self-pay | Admitting: Nurse Practitioner

## 2020-08-06 ENCOUNTER — Ambulatory Visit (INDEPENDENT_AMBULATORY_CARE_PROVIDER_SITE_OTHER): Payer: Medicare HMO | Admitting: Nurse Practitioner

## 2020-08-06 ENCOUNTER — Other Ambulatory Visit: Payer: Self-pay

## 2020-08-06 VITALS — BP 98/62 | Temp 97.5°F | Ht 65.0 in | Wt 281.0 lb

## 2020-08-06 DIAGNOSIS — Z808 Family history of malignant neoplasm of other organs or systems: Secondary | ICD-10-CM | POA: Diagnosis not present

## 2020-08-06 DIAGNOSIS — L989 Disorder of the skin and subcutaneous tissue, unspecified: Secondary | ICD-10-CM

## 2020-08-06 NOTE — Patient Instructions (Signed)
We will call you with referral to dermatologist Follow-up as needed  Skin Tag, Adult  A skin tag (acrochordon) is a soft, extra growth of skin. Most skin tags are skin-colored and rarely bigger than a pencil eraser. They commonly form in areas where there is frequent rubbing, or friction, on the skin. This may be where there are folds in the skin, such as the eyelids, neck, armpit, or groin. Skin tags are not dangerous, and they do not spread from person to person (are not contagious). You may have one skin tag or several. Skin tags do not require treatment. However, your health care provider may recommend removal of a skin tag if it: Gets irritated from clothing or jewelry. Bleeds. Is visible and unsightly. What are the causes? This condition is linked with: Increasing age. Pregnancy. Diabetes. Obesity. What are the signs or symptoms? Skin tags usually do not cause symptoms unless they get irritated by items touching your skin, such as clothing or jewelry. When this happens, you mayhave pain, itching, or bleeding. How is this diagnosed? This condition is diagnosed with an evaluation from your health care provider.No testing is needed for diagnosis. How is this treated? Treatment for this condition depends on whether you have symptoms. If a skin tag needs to be removed, your health care provider can remove it with: A simple surgical procedure using scissors. A procedure that involves freezing your skin tag with a gas in liquid form (liquid nitrogen). A procedure that uses heat to destroy your skin tag (electrodessication). Your health care provider may also remove your skin tag if it is visible orunsightly, Follow these instructions at home: Watch for any changes in your skin tag. A normal skin tag does not require any other special care at home. Take over-the-counter and prescription medicines only as told by your health care provider. Keep all follow-up visits as told by your health  care provider. This is important. Contact a health care provider if: You have a skin tag that: Becomes painful. Changes color. Bleeds. Swells. Summary Skin tags are soft, extra growths of skin found in areas of frequent rubbing or friction. Skin tags usually do not cause symptoms. If symptoms occur, you may have pain, itching, or bleeding. If your skin tag causes symptoms or is unsightly, your health care provider can remove it. This information is not intended to replace advice given to you by your health care provider. Make sure you discuss any questions you have with your healthcare provider. Document Revised: 12/03/2018 Document Reviewed: 12/03/2018 Elsevier Patient Education  2022 [employer].

## 2020-08-09 DIAGNOSIS — J4520 Mild intermittent asthma, uncomplicated: Secondary | ICD-10-CM | POA: Diagnosis not present

## 2020-08-09 DIAGNOSIS — J449 Chronic obstructive pulmonary disease, unspecified: Secondary | ICD-10-CM | POA: Diagnosis not present

## 2020-08-31 DIAGNOSIS — G4733 Obstructive sleep apnea (adult) (pediatric): Secondary | ICD-10-CM | POA: Diagnosis not present

## 2020-09-01 ENCOUNTER — Other Ambulatory Visit: Payer: Self-pay | Admitting: Physician Assistant

## 2020-09-08 DIAGNOSIS — J449 Chronic obstructive pulmonary disease, unspecified: Secondary | ICD-10-CM | POA: Diagnosis not present

## 2020-09-08 DIAGNOSIS — J4520 Mild intermittent asthma, uncomplicated: Secondary | ICD-10-CM | POA: Diagnosis not present

## 2020-09-09 ENCOUNTER — Encounter: Payer: Self-pay | Admitting: Internal Medicine

## 2020-09-09 ENCOUNTER — Ambulatory Visit: Payer: Medicare HMO | Admitting: Internal Medicine

## 2020-09-09 ENCOUNTER — Other Ambulatory Visit: Payer: Self-pay

## 2020-09-09 DIAGNOSIS — J449 Chronic obstructive pulmonary disease, unspecified: Secondary | ICD-10-CM

## 2020-09-09 DIAGNOSIS — J9611 Chronic respiratory failure with hypoxia: Secondary | ICD-10-CM

## 2020-09-09 NOTE — Patient Instructions (Signed)
Make sure you check your oxygen saturation  at your highest level of activity  to be sure it stays over 90% and adjust  02 flow upward to maintain this level if needed but remember to turn it back to previous settings when you stop (to conserve your supply).   To get the most out of exercise, you need to be continuously aware that you are short of breath, but never out of breath, for at least 30 minutes daily. As you improve, it will actually be easier for you to do the same amount of exercise  in  30 minutes so always push to the level where you are short of breath.      No change in medications   Please schedule a follow up visit in 12 months but call sooner if needed

## 2020-09-09 NOTE — Assessment & Plan Note (Signed)
Walk with desats 88% 04/10/2014 >begin O2 w/ act 2 l/m  -07/14/2015  Walked RA x 3 laps @ 185 ft each stopped due to end of study, nl pace, no significant desat or sob. > hs 02 only  - ono on 2lpm/cpap  07/20/2017   desats < 89% x 2.4 min  Advised re 02 with ex: goal is > 90% to help recondition and get into neg cal balance.   F/u can be q 12 m or let PCP refill stiolto, whichever works best.         Each maintenance medication was reviewed in detail including emphasizing most importantly the difference between maintenance and prns and under what circumstances the prns are to be triggered using an action plan format where appropriate.  Total time for H and P, chart review, counseling, reviewing smi/02 device(s) and generating customized AVS unique to this office visit / same day charting = 25 min

## 2020-09-09 NOTE — Assessment & Plan Note (Signed)
Quit smoking around 2007  - 11/27/2013  PFTs   FEV1  1.51 (54%) ratio 52 and and no better p B2 and DLCO  71 - 01/08/2014  p extensive coaching HFA effectiveness =    90% > rec resume symbicort 160 2bid  - 03/27/14 trial of dulera 200/tudorza samples only to assure adherence  04/10/2014   Alpha 1 MM , nml level  - trial off spiriva 05/22/14 / off all resp rx 08/22/14 - PFT's  10/09/2014  FEV1 1.64 (59 % ) ratio 54  p 11 % improvement from saba with DLCO  69 % corrects to 73  % for alv volume   - 07/14/2015  A  > try spiriva 2 puffs each am > no better so pt d/c'd  - PFT's  07/18/2017  FEV1 1.10 (41 % ) ratio 46   p 19 % improvement from saba p nothing prior to study with DLCO  70 % corrects to 75  % for alv volume  On coreg  - 07/18/2017    try stiolto  - Spirometry 12/04/2017  FEV1 1.3 (49%)  Ratio 49 p am stiolto x 2 puffs  w classic curvature - 06/26/2018  After extensive coaching inhaler device,  effectiveness =   90%   Pt is Group B in terms of symptom/risk and laba/lama therefore appropriate rx at this point >>>  Continue stiolto plus approp saba  I spent extra time with pt today reviewing appropriate use of albuterol for prn use on exertion with the following points: 1) saba is for relief of sob that does not improve by walking a slower pace or resting but rather if the pt does not improve after trying this first. 2) If the pt is convinced, as many are, that saba helps recover from activity faster then it's easy to tell if this is the case by re-challenging : ie stop, take the inhaler, then p 5 minutes try the exact same activity (intensity of workload) that just caused the symptoms and see if they are substantially diminished or not after saba 3) if there is an activity that reproducibly causes the symptoms, try the saba 15 min before the activity on alternate days   If in fact the saba really does help, then fine to continue to use it prn but advised may need to look closer at the maintenance regimen  being used to achieve better control of airways disease with exertion.

## 2020-09-09 NOTE — Progress Notes (Signed)
Subjective:     Patient ID: Judy [name], female   DOB: Sep 18, 1960,  MRN: [id number]    Brief patient profile:  60 yowf MM/quit smoking 2007  @  150 lb  with cough that resolved and no resp problems until winter 2015 with doe x walking the dog s much in terms of cough then 10/04/13 sudden sense she couldn't get a breath and coughed up blood x one tsp plus slt green mucus  > better with saba and started on inhalers/ abx  >  better and pred taper and dx of GOLD II copd was made 11/2013 and non ischemic cardiomyopathy 05/14/14.     History of Present Illness  10/08/2013 [facility] office visit/ Judy [name] Chief Complaint  Patient presents with   Pulmonary Consult    Referred by [facility]-sob with exertion x 6-8 mths.,worse since 4 days ago,occass. cough-tsp. of blood 4 days ago,usually unprod.,no wheezing,midchest tightness,no fcs,Had neb. since yesterday(used 2x yesterday)   baseline = 165ft   to mailbox and sob when gets there.  rec Clonidine 0.1 mg twice daily until you return  Plan A = automatic = symbiocort 160 Take 2 puffs first thing in am and then another 2 puffs about 12 hours later.  Plan B = Only use your albuterol (proair)as a rescue medication    Plan C = nebulizer albuterol, ok to use up to every 4 hours if can't get relief from Plan B GERD diet     LHC 05/14/14  1. No significant coronary artery disease  2. Moderate to severely reduced LV systolic function due to nonischemic cardiomyopathy.    3. Moderately elevated left ventricular end-diastolic pressure.    07/18/2017 acute extended ov/Judy [name] Large re: re-establish for sob  Chief Complaint  Patient presents with   Acute Visit    Increased SOB x 6-8 wks.  She states she is getting SOB just walking from room to room at home.   All started p abruptly became  light headed about 6 wk prior to OV  > bp was low > adjustments made to bp meds (decrease lasix and coreg) then sob worse s cough and now doe = MMRC4  = sob if tries  to leave home or while getting dressed some better when wears 02 but not monitoring daytime sats   02 is 2lpm with cpap and prn daytime  no noct symtpoms on cpap flat > feels rested rec Goal is to keep 02 sats over 90% at all times so take it with activity if needed and turn 02 up to achieve this goal  Continue lasix as needed to control feet swelling  Please see patient coordinator before you leave today  to schedule overnight  Oxymetry on 02 an cpap Please remember to go to the  x-ray department downstairs in the basement  for your tests - we will call you with the results when they are available.  Please schedule a follow up office visit in 6 weeks, call sooner if needed  - add stiolto 2 pffs each am     08/29/2017  f/u ov/Judy [name] re:  GOLD III/ 02 dep hs with cpap and  ? With exertion  Chief Complaint  Patient presents with   Follow-up    Breathing is some better but not back to baseline.  She has had sore throat and increased cough for the past few days. Cough has been non prod.    Dyspnea:  MMRC3 = some better but still  can't  walk 100 yards even at a slow pace at a flat grade s stopping due to sob but admits not good about checking sats when walking    Cough: dry day > noct new x sev months assoc with hoarseness not post nasal drip/ wheeze  SABA use: none 02: 2lpm at hs and prn daytime  rec Try prilosec otc 20mg   Take 30-60 min before first meal of the day and Pepcid ac (famotidine) 20 mg one @  bedtime until cough is completely gone for at least a week   Adjust 02 to sats > 90% at all times especially while walking to help you burn fat and get into negative balance      12/04/2017  f/u ov/Judy [name] re:  GOLD III / 02 dep hs at 2lpm and 2lpm with ex bike Chief Complaint  Patient presents with   Follow-up    COPD GOLD III  Dyspnea: ex bike  3 x weekly x 10 min s stopping and w/in 5 min needs 02 if not starting out with it  Cough: resolved  Sleeping: bed is flat/ L side down/ one  pillow/ cpap per turner  SABA use: none on stiolto 2 each am  02: as above   rec Work on maintaining perfect  inhaler technique  No change in medications Adjust your 02 upwards as needed to maintain 02 sats well above 90% while exercising      07/08/2019  f/u ov/Judy [name] re:  COPD III/ 02 hs only/ maint stiolto Chief Complaint  Patient presents with   Follow-up    Breathing is unchanged. No new co's today.   Dyspnea:  Room to room  Cough: none Sleeping: on side / one pillow SABA use: no need while on stiolto 02: 2lpm hs only  rec No change   07/22/2020 acute extended ov/Judy [name] re:  Sob p covid Chief Complaint  Patient presents with   Shortness of Breath    Reports +covid test on 01 Jul 2020 and started medication on 02 Jul 2020 with PCP. Says improvement since but still has dyspnea  Onset May 16th 2022 p 3 vaccinations = ST / HA/ sluggish/ no cough stuffy nose/ no energy then Pos Covid 19 rx paxlovid x 5 days >  99% in every regard x for doe / not using 02  Cough not a problem Sleep flat/ cpap and feels rested  02 2lpm hs not using during the day  Rec To get the most out of exercise, you need to be continuously aware that you are short of breath, and sats stay over 90%   09/09/2020  f/u ov/Judy [name] re: doe p covid/ GOLD III maint on stiolto and noct plus prn 02  Chief Complaint  Patient presents with   COPD  Dyspnea:  50 ft with standing walker not using 02 Cough: very little  Sleeping: cpap and 2pm flat 1 pillow  SABA use: none  02: 2lpm and up 2lplm walking  Covid status:   vax x 3 and omicron may 2022   No obvious day to day or daytime variability or assoc excess/ purulent sputum or mucus plugs or hemoptysis or cp or chest tightness, subjective wheeze or overt sinus or hb symptoms.   Sleeping  without nocturnal  or early am exacerbation  of respiratory  c/o's or need for noct saba. Also denies any obvious fluctuation of symptoms with weather or environmental changes or other  aggravating or alleviating factors except as outlined above   No unusual  exposure hx or h/o childhood pna/ asthma or knowledge of premature birth.  Current Allergies, Complete Past Medical History, Past Surgical History, Family History, and Social History were reviewed in [employer] record.  ROS  The following are not active complaints unless bolded Hoarseness, sore throat, dysphagia, dental problems, itching, sneezing,  nasal congestion or discharge of excess mucus or purulent secretions, ear ache,   fever, chills, sweats, unintended wt loss or wt gain, classically pleuritic or exertional cp,  orthopnea pnd or arm/hand swelling  or leg swelling, presyncope, palpitations, abdominal pain, anorexia, nausea, vomiting, diarrhea  or change in bowel habits or change in bladder habits, change in stools or change in urine, dysuria, hematuria,  rash, arthralgias, visual complaints, headache, numbness, weakness or ataxia or problems with walking or coordination,  change in mood or  memory.        Current Meds  Medication Sig   albuterol (VENTOLIN HFA) 108 (90 Base) MCG/ACT inhaler Inhale 2 puffs into the lungs every 6 (six) hours as needed for wheezing or shortness of breath.   aspirin 81 MG tablet Take 81 mg by mouth at bedtime.    atorvastatin (LIPITOR) 20 MG tablet TAKE 1 TABLET EVERY DAY   buPROPion (WELLBUTRIN XL) 300 MG 24 hr tablet TAKE 1 TABLET EVERY DAY   carvedilol (COREG) 6.25 MG tablet Take 1 tablet (6.25 mg total) by mouth 2 (two) times daily.   clonazePAM (KLONOPIN) 0.5 MG tablet Take 1 tablet (0.5 mg total) by mouth 2 (two) times daily as needed for anxiety (panic attacks).   Coenzyme Q10 (CO Q 10) 100 MG CAPS Take 200 mg by mouth daily.   diphenhydrAMINE (BENADRYL) 25 MG tablet Take 50 mg by mouth at bedtime as needed.   Dulaglutide (TRULICITY) 3 MG/0.5ML SOPN Inject 3 mg as directed once a week.   FARXIGA 10 MG TABS tablet Take 1 tablet (10 mg total) by mouth  daily.   gabapentin (NEURONTIN) 400 MG capsule TAKE 2 CAPSULES IN THE MORNING AND TAKE 2 CAPSULES IN THE EVENING   icosapent Ethyl (VASCEPA) 1 g capsule Take 2 capsules (2 g total) by mouth 2 (two) times daily.   levothyroxine (SYNTHROID) 75 MCG tablet TAKE 1 TABLET (75 MCG TOTAL) BY MOUTH DAILY.   Lutein 20 MG TABS Take 1 tablet by mouth daily.   Multiple Vitamin (MULTIVITAMIN WITH MINERALS) TABS tablet Take 1 tablet by mouth daily.   OXYGEN 2lpm 2 lpm as needed   sacubitril-valsartan (ENTRESTO) 24-26 MG Take 1 tablet by mouth 2 (two) times daily.   Tiotropium Bromide-Olodaterol (STIOLTO RESPIMAT) 2.5-2.5 MCG/ACT AERS INHALE 2 PUFFS INTO THE LUNGS DAILY.   tiZANidine (ZANAFLEX) 4 MG tablet TAKE 1 TABLET EVERY 6 HOURS AS NEEDED FOR MUSCLE SPASM(S)   traMADol (ULTRAM) 50 MG tablet Take 1 tablet (50 mg total) by mouth 2 (two) times daily.   UNABLE TO FIND CPAP with o2 2lpm  DME- [facility]   Vitamin D, Ergocalciferol, (DRISDOL) 1.25 MG (50000 UNIT) CAPS capsule TAKE 1 CAPSULE EVERY 7 DAYS                      Objective:   Physical Exam  09/09/2020     280  07/22/2020       284 07/08/2019     295  12/31/2018   293  11/27/2013     237 >   01/08/2014  236 > 01/21/2014 241 >235 02/21/2014 > 02/26/2014  232 > 03/27/2014  236 >236 04/10/2014 > 05/22/2014   237 >  08/21/2014 245 > 10/09/2014 257 >  01/12/2015   268 > 07/14/2015  278 > 01/14/2016  292  > 07/18/2017   301  > 08/29/2017  303 > 12/04/2017  298 > 06/26/2018  291    Vital signs reviewed  09/09/2020  - Note at rest 02 sats  93% on RA   General appearance:    obese wf nad / up walker      HEENT : pt wearing mask not removed for exam due to covid - 19 concerns.    NECK :  without JVD/Nodes/TM/ nl carotid upstrokes bilaterally   LUNGS: no acc muscle use,  Mild barrel  contour chest wall with bilateral  Distant bs s audible wheeze and  without cough on insp or exp maneuvers  and mild  Hyperresonant  to  percussion bilaterally     CV:  RRR  no s3 or  murmur or increase in P2, and no edema   ABD:  soft and nontender with pos end  insp Hoover's  in the supine position. No bruits or organomegaly appreciated, bowel sounds nl  MS:   Nl gait/  ext warm without deformities, calf tenderness, cyanosis or clubbing No obvious joint restrictions   SKIN: warm and dry without lesions    NEURO:  alert, approp, nl sensorium with  no motor or cerebellar deficits apparent.             Assessment:

## 2020-10-01 DIAGNOSIS — G4733 Obstructive sleep apnea (adult) (pediatric): Secondary | ICD-10-CM | POA: Diagnosis not present

## 2020-10-09 DIAGNOSIS — J449 Chronic obstructive pulmonary disease, unspecified: Secondary | ICD-10-CM | POA: Diagnosis not present

## 2020-10-09 DIAGNOSIS — J4520 Mild intermittent asthma, uncomplicated: Secondary | ICD-10-CM | POA: Diagnosis not present

## 2020-10-13 ENCOUNTER — Other Ambulatory Visit: Payer: Medicare HMO

## 2020-10-13 DIAGNOSIS — E785 Hyperlipidemia, unspecified: Secondary | ICD-10-CM

## 2020-10-13 DIAGNOSIS — E1169 Type 2 diabetes mellitus with other specified complication: Secondary | ICD-10-CM

## 2020-10-13 DIAGNOSIS — I10 Essential (primary) hypertension: Secondary | ICD-10-CM

## 2020-10-14 ENCOUNTER — Encounter: Payer: Self-pay | Admitting: Family Medicine

## 2020-10-14 LAB — COMPREHENSIVE METABOLIC PANEL
ALT: 25 IU/L (ref 0–32)
AST: 19 IU/L (ref 0–40)
Albumin/Globulin Ratio: 1.8 (ref 1.2–2.2)
Albumin: 4.1 g/dL (ref 3.8–4.9)
Alkaline Phosphatase: 187 IU/L — ABNORMAL HIGH (ref 44–121)
BUN/Creatinine Ratio: 15 (ref 12–28)
BUN: 12 mg/dL (ref 8–27)
Bilirubin Total: 0.9 mg/dL (ref 0.0–1.2)
CO2: 24 mmol/L (ref 20–29)
Calcium: 9.4 mg/dL (ref 8.7–10.3)
Chloride: 107 mmol/L — ABNORMAL HIGH (ref 96–106)
Creatinine, Ser: 0.81 mg/dL (ref 0.57–1.00)
Globulin, Total: 2.3 g/dL (ref 1.5–4.5)
Glucose: 88 mg/dL (ref 65–99)
Potassium: 4.6 mmol/L (ref 3.5–5.2)
Sodium: 145 mmol/L — ABNORMAL HIGH (ref 134–144)
Total Protein: 6.4 g/dL (ref 6.0–8.5)
eGFR: 83 mL/min/{1.73_m2} (ref >59)

## 2020-10-14 LAB — CBC WITH DIFFERENTIAL/PLATELET
Basophils Absolute: 0.1 10*3/uL (ref 0.0–0.2)
Basos: 1 %
EOS (ABSOLUTE): 0.2 10*3/uL (ref 0.0–0.4)
Eos: 4 %
Hematocrit: 45.6 % (ref 34.0–46.6)
Hemoglobin: 15.1 g/dL (ref 11.1–15.9)
Immature Grans (Abs): 0 10*3/uL (ref 0.0–0.1)
Immature Granulocytes: 0 %
Lymphocytes Absolute: 1.9 10*3/uL (ref 0.7–3.1)
Lymphs: 28 %
MCH: 30.3 pg (ref 26.6–33.0)
MCHC: 33.1 g/dL (ref 31.5–35.7)
MCV: 92 fL (ref 79–97)
Monocytes Absolute: 0.6 10*3/uL (ref 0.1–0.9)
Monocytes: 9 %
Neutrophils Absolute: 4 10*3/uL (ref 1.4–7.0)
Neutrophils: 58 %
Platelets: 259 10*3/uL (ref 150–450)
RBC: 4.98 x10E6/uL (ref 3.77–5.28)
RDW: 12.7 % (ref 11.7–15.4)
WBC: 6.9 10*3/uL (ref 3.4–10.8)

## 2020-10-14 LAB — HEMOGLOBIN A1C
Est. average glucose Bld gHb Est-mCnc: 128 mg/dL
Hgb A1c MFr Bld: 6.1 % — ABNORMAL HIGH (ref 4.8–5.6)

## 2020-10-14 LAB — LIPID PANEL
Chol/HDL Ratio: 3.8 ratio (ref 0.0–4.4)
Cholesterol, Total: 135 mg/dL (ref 100–199)
HDL: 36 mg/dL — ABNORMAL LOW (ref >39)
LDL Chol Calc (NIH): 69 mg/dL (ref 0–99)
Triglycerides: 176 mg/dL — ABNORMAL HIGH (ref 0–149)
VLDL Cholesterol Cal: 30 mg/dL (ref 5–40)

## 2020-10-14 LAB — CARDIOVASCULAR RISK ASSESSMENT

## 2020-10-14 NOTE — Progress Notes (Signed)
Subjective:  Patient ID: Judy [name], female    DOB: 02/23/1960  Age: 60 y.o. MRN: [id number]  Chief Complaint  Patient presents with   Diabetes   Hyperlipidemia    HPI  Diabetes:  Complications: hyperlipidemia Glucose checking: not checking Most recent A1C: 6.1 Current medications: Farxiga 10 mg once daily, Trulicity 3 mg once daily Last Eye Exam:02/24/20 Foot checks: ye Lifestyle changes: Eating fairly healthy. Unable to exercise due to back pain.   Hyperlipidemia: Current medications: lipitor, vascepa, coenzyme Q 10 daily Labs reviewed. LDL at goal. Trigs little high at 176.  Hypertension with chf due to idiopathic cardiomyopathy. Current medications: Coreg 6.25 mg twice daily, Entresto 24-26 mg twice daily., aspirin,  Lifestyle changes: eats healthy. Unable to exercise.   Chronic pain syndrome: lumbar back pain secondary to severe scoliosis and harrington rod complication. Taking tramadol and tylenol. Has muscle relaxant. Significant pain with walking. Has PO scooter.  OSA/COPD: uses cpap. Compliant and benefits. On stiolto. Sees pulomonology.   Hypothyroidism -currently on synthroid 75 mcg  Depression, mild, recurrent- wellbutrin XL 300 mg once daily in a.m. clonazepam 0.5 mg 1 twice daily as needed for panic attacks.  Patient very rarely takes this.  Current Outpatient Medications on File Prior to Visit  Medication Sig Dispense Refill   albuterol (VENTOLIN HFA) 108 (90 Base) MCG/ACT inhaler Inhale 2 puffs into the lungs every 6 (six) hours as needed for wheezing or shortness of breath. 8 g 2   aspirin 81 MG tablet Take 81 mg by mouth at bedtime.      atorvastatin (LIPITOR) 20 MG tablet TAKE 1 TABLET EVERY DAY 90 tablet 0   buPROPion (WELLBUTRIN XL) 300 MG 24 hr tablet TAKE 1 TABLET EVERY DAY 90 tablet 3   carvedilol (COREG) 6.25 MG tablet Take 1 tablet (6.25 mg total) by mouth 2 (two) times daily. 180 tablet 3   clonazePAM (KLONOPIN) 0.5 MG tablet Take 1 tablet  (0.5 mg total) by mouth 2 (two) times daily as needed for anxiety (panic attacks). 180 tablet 0   Coenzyme Q10 (CO Q 10) 100 MG CAPS Take 200 mg by mouth daily.     diphenhydrAMINE (BENADRYL) 25 MG tablet Take 50 mg by mouth at bedtime as needed.     Dulaglutide (TRULICITY) 3 MG/0.5ML SOPN Inject 3 mg as directed once a week. 2 mL 0   FARXIGA 10 MG TABS tablet Take 1 tablet (10 mg total) by mouth daily. 90 tablet 3   gabapentin (NEURONTIN) 400 MG capsule TAKE 2 CAPSULES IN THE MORNING AND TAKE 2 CAPSULES IN THE EVENING 360 capsule 3   icosapent Ethyl (VASCEPA) 1 g capsule Take 2 capsules (2 g total) by mouth 2 (two) times daily. 360 capsule 1   levothyroxine (SYNTHROID) 75 MCG tablet TAKE 1 TABLET (75 MCG TOTAL) BY MOUTH DAILY. 90 tablet 1   Lutein 20 MG TABS Take 1 tablet by mouth daily.     Multiple Vitamin (MULTIVITAMIN WITH MINERALS) TABS tablet Take 1 tablet by mouth daily.     OXYGEN 2lpm 2 lpm as needed     sacubitril-valsartan (ENTRESTO) 24-26 MG Take 1 tablet by mouth 2 (two) times daily. 180 tablet 3   Tiotropium Bromide-Olodaterol (STIOLTO RESPIMAT) 2.5-2.5 MCG/ACT AERS INHALE 2 PUFFS INTO THE LUNGS DAILY. 12 g 5   tiZANidine (ZANAFLEX) 4 MG tablet TAKE 1 TABLET EVERY 6 HOURS AS NEEDED FOR MUSCLE SPASM(S) 360 tablet 1   traMADol (ULTRAM) 50 MG tablet Take 1 tablet (  50 mg total) by mouth 2 (two) times daily. 60 tablet 2   UNABLE TO FIND CPAP with o2 2lpm  DME- [facility]     Vitamin D, Ergocalciferol, (DRISDOL) 1.25 MG (50000 UNIT) CAPS capsule TAKE 1 CAPSULE EVERY 7 DAYS 12 capsule 3   No current facility-administered medications on file prior to visit.   Past Medical History:  Diagnosis Date   Anxiety    Back pain    Bilateral swelling of feet    Chronic combined systolic and diastolic CHF (congestive heart failure) (HCC) 10/07/2014   COPD (chronic obstructive pulmonary disease) (HCC)    DCM (dilated cardiomyopathy) (HCC) 04/25/2014   EF 28% by MRI despite maximum medical therapy    Depression    Diabetes mellitus without complication (HCC)    Epilepsy (HCC)    as a child   Fatty liver    Former tobacco use    Hyperlipidemia    Hypertension    Hypothyroidism    Joint pain    Leg swelling    Morbid obesity (HCC)    NICM (nonischemic cardiomyopathy) (HCC)    OSA (obstructive sleep apnea)    moderate with AHI 26/hr with oxygen desaturations as low as 70%   Scoliosis    Sleep apnea    SOB (shortness of breath)    Past Surgical History:  Procedure Laterality Date   ABDOMINAL HYSTERECTOMY     ankle artery involved cyst removal     CARDIAC CATHETERIZATION  05/14/2014   normal coronary arteries   CARPAL TUNNEL RELEASE     FASCIOTOMY     1993 for platar fasciitis   harrington rod scoliosis     1981   LEFT HEART CATHETERIZATION WITH CORONARY ANGIOGRAM N/A 05/14/2014   Procedure: LEFT HEART CATHETERIZATION WITH CORONARY ANGIOGRAM;  Surgeon: Iran Ouch, MD;  Location: [facility];  Service: Cardiovascular;  Laterality: N/A;   ROTATOR CUFF REPAIR     UMBILICAL HERNIA REPAIR     VESICOVAGINAL FISTULA CLOSURE W/ TAH      Family History  Problem Relation Age of Onset   Emphysema Father    Allergies Father    Heart failure Father    Heart disease Father 15       MI   Diabetes Father    Hyperlipidemia Father    Hypertension Father    Thyroid disease Father    Alcohol abuse Father    Obesity Father    Diabetes Mother    Hyperlipidemia Mother    Obesity Mother    Social History   Socioeconomic History   Marital status: Married    Spouse name: Not on file   Number of children: Not on file   Years of education: Not on file   Highest education level: Not on file  Occupational History   Occupation: disabled  Tobacco Use   Smoking status: Former    Packs/day: 1.00    Years: 15.00    Pack years: 15.00    Types: Cigarettes    Quit date: 02/14/2005    Years since quitting: 15.6   Smokeless tobacco: Never  Vaping Use   Vaping Use: Never used   Substance and Sexual Activity   Alcohol use: Yes    Comment: occ   Drug use: No   Sexual activity: Not on file  Other Topics Concern   Not on file  Social History Narrative   Lives in [place] with spouse and son.   Previously worked as a physical  therapist assistant         Social Determinants of Health   Financial Resource Strain: Not on file  Food Insecurity: No Food Insecurity   Worried About Programme researcher, broadcasting/film/video in the Last Year: Never true   Ran Out of Food in the Last Year: Never true  Transportation Needs: No Transportation Needs   Lack of Transportation (Medical): No   Lack of Transportation (Non-Medical): No  Physical Activity: Inactive   Days of Exercise per Week: 0 days   Minutes of Exercise per Session: 0 min  Stress: Not on file  Social Connections: Not on file    Review of Systems  Constitutional:  Negative for chills, fatigue and fever.  HENT:  Negative for congestion, ear pain, rhinorrhea and sore throat.   Respiratory:  Positive for shortness of breath. Negative for cough.   Cardiovascular:  Negative for chest pain.  Gastrointestinal:  Negative for abdominal pain, constipation, diarrhea, nausea and vomiting.  Genitourinary:  Negative for dysuria and urgency.  Musculoskeletal:  Negative for back pain and myalgias.  Skin:        Atypical mole on chest.   Neurological:  Negative for dizziness, weakness, light-headedness and headaches.  Psychiatric/Behavioral:  Negative for dysphoric mood. The patient is not nervous/anxious.     Objective:  BP 106/70   Pulse 80   Temp 97.6 F (36.4 C)   Resp 20   Ht 5\' 5"  (1.651 m)   Wt 276 lb (125.2 kg)   BMI 45.93 kg/m   BP/Weight 10/15/2020 09/09/2020 08/06/2020  Systolic BP 106 138 98  Diastolic BP 70 78 62  Wt. (Lbs) 276 280 281  BMI 45.93 46.59 46.76    Physical Exam Vitals reviewed.  Constitutional:      Appearance: Normal appearance. She is obese.  Neck:     Vascular: No carotid bruit.   Cardiovascular:     Rate and Rhythm: Normal rate and regular rhythm.     Pulses: Normal pulses.     Heart sounds: Normal heart sounds.  Pulmonary:     Effort: Pulmonary effort is normal. No respiratory distress.     Breath sounds: Normal breath sounds.  Abdominal:     General: Abdomen is flat. Bowel sounds are normal.     Palpations: Abdomen is soft.     Tenderness: There is no abdominal tenderness.  Musculoskeletal:     Lumbar back: Tenderness present.  Neurological:     Mental Status: She is alert and oriented to person, place, and time.  Psychiatric:        Mood and Affect: Mood normal.        Behavior: Behavior normal.    Diabetic Foot Exam - Simple   Simple Foot Form  10/15/2020 10:37 PM  Visual Inspection See comments: Yes Sensation Testing Intact to touch and monofilament testing bilaterally: Yes Pulse Check Posterior Tibialis and Dorsalis pulse intact bilaterally: Yes Comments Right second claw toe calluses  BL heels, Good sensation      Lab Results  Component Value Date   WBC 6.9 10/13/2020   HGB 15.1 10/13/2020   HCT 45.6 10/13/2020   PLT 259 10/13/2020   GLUCOSE 88 10/13/2020   CHOL 135 10/13/2020   TRIG 176 (H) 10/13/2020   HDL 36 (L) 10/13/2020   LDLCALC 69 10/13/2020   ALT 25 10/13/2020   AST 19 10/13/2020   NA 145 (H) 10/13/2020   K 4.6 10/13/2020   CL 107 (H) 10/13/2020   CREATININE  0.81 10/13/2020   BUN 12 10/13/2020   CO2 24 10/13/2020   TSH 3.070 04/06/2020   INR 1.0 05/13/2014   HGBA1C 6.1 (H) 10/13/2020   MICROALBUR Negative 09/03/2019      Assessment & Plan:   1. Hyperlipidemia associated with type 2 diabetes mellitus (HCC) Control: great Recommend check sugars fasting daily. Recommend check feet daily. Recommend annual eye exams. Medicines: no changes Continue to work on eating a healthy diet and exercise.  Labs reviewed   2. Benign hypertensive heart disease with congestive heart failure and with combined systolic and  diastolic dysfunction, NYHA class 3 (HCC) Well controlled. EF Has greatly improved.  No changes to medicines.  Continue to work on eating a healthy diet and exercise.  Labs reviewed.  3. Other specified hypothyroidism The current medical regimen is effective;  continue present plan and medications.  4. Encounter for immunization - Flu Vaccine MDCK QUAD PF  5. OSA on CPAP Continue cpap. Compliant and benefitting. Management per specialist.   6. COPD mixed type (HCC) The current medical regimen is effective;  continue present plan and medications. Management per specialist.   7. Chronic midline low back pain without sciatica Continue tramadol and tylenol. Recommend use muscle relaxants. Not at goal.  8. Chronic pain syndrome  See above.   9. Morbid Obesity Recommend continue to work on eating healthy diet and exercise (rec stationary bike.)   Orders Placed This Encounter  Procedures   Flu Vaccine MDCK QUAD PF     Follow-up: Return in about 3 months (around 01/14/2021) for fasting.  An After Visit Summary was printed and given to the patient.  Blane Ohara, MD [facility] [phone]

## 2020-10-15 ENCOUNTER — Ambulatory Visit (INDEPENDENT_AMBULATORY_CARE_PROVIDER_SITE_OTHER): Payer: Medicare HMO | Admitting: Family Medicine

## 2020-10-15 ENCOUNTER — Other Ambulatory Visit: Payer: Self-pay

## 2020-10-15 VITALS — BP 106/70 | HR 80 | Temp 97.6°F | Resp 20 | Ht 65.0 in | Wt 276.0 lb

## 2020-10-15 DIAGNOSIS — M545 Low back pain, unspecified: Secondary | ICD-10-CM | POA: Diagnosis not present

## 2020-10-15 DIAGNOSIS — G8929 Other chronic pain: Secondary | ICD-10-CM

## 2020-10-15 DIAGNOSIS — G4733 Obstructive sleep apnea (adult) (pediatric): Secondary | ICD-10-CM

## 2020-10-15 DIAGNOSIS — E785 Hyperlipidemia, unspecified: Secondary | ICD-10-CM

## 2020-10-15 DIAGNOSIS — Z23 Encounter for immunization: Secondary | ICD-10-CM | POA: Diagnosis not present

## 2020-10-15 DIAGNOSIS — G894 Chronic pain syndrome: Secondary | ICD-10-CM

## 2020-10-15 DIAGNOSIS — I110 Hypertensive heart disease with heart failure: Secondary | ICD-10-CM

## 2020-10-15 DIAGNOSIS — J449 Chronic obstructive pulmonary disease, unspecified: Secondary | ICD-10-CM | POA: Diagnosis not present

## 2020-10-15 DIAGNOSIS — I5040 Unspecified combined systolic (congestive) and diastolic (congestive) heart failure: Secondary | ICD-10-CM

## 2020-10-15 DIAGNOSIS — Z6841 Body Mass Index (BMI) 40.0 and over, adult: Secondary | ICD-10-CM

## 2020-10-15 DIAGNOSIS — E1169 Type 2 diabetes mellitus with other specified complication: Secondary | ICD-10-CM

## 2020-10-15 DIAGNOSIS — I10 Essential (primary) hypertension: Secondary | ICD-10-CM

## 2020-10-15 DIAGNOSIS — E1165 Type 2 diabetes mellitus with hyperglycemia: Secondary | ICD-10-CM

## 2020-10-15 DIAGNOSIS — E038 Other specified hypothyroidism: Secondary | ICD-10-CM | POA: Diagnosis not present

## 2020-10-15 DIAGNOSIS — Z9989 Dependence on other enabling machines and devices: Secondary | ICD-10-CM

## 2020-10-19 ENCOUNTER — Encounter: Payer: Self-pay | Admitting: Family Medicine

## 2020-10-28 ENCOUNTER — Other Ambulatory Visit: Payer: Self-pay

## 2020-10-28 ENCOUNTER — Ambulatory Visit (INDEPENDENT_AMBULATORY_CARE_PROVIDER_SITE_OTHER): Payer: Medicare HMO

## 2020-10-28 DIAGNOSIS — E1169 Type 2 diabetes mellitus with other specified complication: Secondary | ICD-10-CM

## 2020-10-28 DIAGNOSIS — I10 Essential (primary) hypertension: Secondary | ICD-10-CM

## 2020-10-28 DIAGNOSIS — E1165 Type 2 diabetes mellitus with hyperglycemia: Secondary | ICD-10-CM

## 2020-10-28 DIAGNOSIS — E038 Other specified hypothyroidism: Secondary | ICD-10-CM

## 2020-10-28 NOTE — Patient Instructions (Signed)
Visit Information   Goals Addressed   None    Patient Care Plan: CCM Pharmacy Care Plan     Problem Identified: dm, hld, chf   Priority: High  Onset Date: 05/14/2020     Long-Range Goal: Patient-Specific Goal   Start Date: 05/14/2020  Expected End Date: 05/14/2021  Recent Progress: On track  Priority: High  Note:    Current Barriers:  Unable to independently afford treatment regimen  Pharmacist Clinical Goal(s):  Patient will verbalize ability to afford treatment regimen through collaboration with PharmD and provider.   Interventions: 1:1 collaboration with Cox, Fritzi Mandes, MD regarding development and update of comprehensive plan of care as evidenced by provider attestation and co-signature Inter-disciplinary care team collaboration (see longitudinal plan of care) Comprehensive medication review performed; medication list updated in electronic medical record  Hyperlipidemia: (LDL goal < 70) Lab Results  Component Value Date   CHOL 135 10/13/2020   CHOL 135 07/10/2020   CHOL 154 04/06/2020   Lab Results  Component Value Date   HDL 36 (L) 10/13/2020   HDL 38 (L) 07/10/2020   HDL 42 04/06/2020   Lab Results  Component Value Date   LDLCALC 69 10/13/2020   LDLCALC 70 07/10/2020   LDLCALC 83 04/06/2020   Lab Results  Component Value Date   TRIG 176 (H) 10/13/2020   TRIG 154 (H) 07/10/2020   TRIG 170 (H) 04/06/2020   Lab Results  Component Value Date   CHOLHDL 3.8 10/13/2020   CHOLHDL 3.6 07/10/2020   CHOLHDL 3.7 04/06/2020  No results found for: LDLDIRECT -Not ideally controlled -Current treatment: vascepa 1 gram 2 capsules twice daily  Atorvastatin 20 mg daily  Aspirin 81 mg daily at bedtime  -Medications previously tried: none reported  -Current dietary patterns: cooks at home and watches salt intake -Current exercise habits: no formal exercise but has joined [employer] and hopes to begin working out in the pool -Educated on Cholesterol goals;  Benefits of  statin for ASCVD risk reduction; Importance of limiting foods high in cholesterol; Exercise goal of 150 minutes per week; -Counseled on diet and exercise extensively Recommended to continue current medication Assessed cost of Vascepa. Coordinated [employer]. [employer] cannot secondary bill Vascepa to [facility] so pharmacist coordinating fill for Vascepa through [facility].   Diabetes (A1c goal <7%) Lab Results  Component Value Date   HGBA1C 6.1 (H) 10/13/2020   HGBA1C 6.7 (H) 07/10/2020   HGBA1C 7.3 (H) 04/06/2020   Lab Results  Component Value Date   MICROALBUR Negative 09/03/2019   LDLCALC 69 10/13/2020   CREATININE 0.81 10/13/2020   Lab Results  Component Value Date   NA 145 (H) 10/13/2020   K 4.6 10/13/2020   CREATININE 0.81 10/13/2020   GFRNONAA 75 04/06/2020   GFRAA 87 04/06/2020   GLUCOSE 88 10/13/2020   Lab Results  Component Value Date   WBC 6.9 10/13/2020   HGB 15.1 10/13/2020   HCT 45.6 10/13/2020   MCV 92 10/13/2020   PLT 259 10/13/2020  -controlled -Current medications: Trulicity 3 mg weekly  Farxiga 10 mg daily  -Medications previously tried: none reported  -Current home glucose readings fasting glucose: 106-124  post prandial glucose: not checking  -Denies hypoglycemic/hyperglycemic symptoms -Current meal patterns:  Patient reports cooking at home mainly. Works to avoid sodium and buys no salt added options. States that she probably doesn't eat as well as she should.  -Current exercise: no formal exercise currently but has joined the [employer] for water aerobics.  -  Educated on A1c and blood sugar goals; Complications of diabetes including kidney damage, retinal damage, and cardiovascular disease; Exercise goal of 150 minutes per week; Benefits of routine self-monitoring of blood sugar; -Counseled to check feet daily and get yearly eye exams -Counseled on diet and exercise extensively Recommended to continue current medication Collaborated with [employer]  patient assistance to request Trulicity 3 mg weekly dose shipped to patient. Patient has had poor success with Libre 2 sample sticking to her arm. Pharmacist provided patch to assist with this.  Assessed patient's eligibility for [employer] patient assistance program.  [place]. Discussed benefit for heart failure, kidney and blood sugar management.    Hypothyroid (Goal: manage TSH and symptoms of thyroid disease) Lab Results  Component Value Date   TSH 3.070 04/06/2020  -Controlled -Current treatment  Levothyroxine 75 mcg daily  -Medications previously tried: none reported  -Recommended to continue current medication  Heart Failure (Goal: manage symptoms and prevent exacerbations) -Controlled -Home BP Readings: Tests couple times/month  Normal 110/90 -Last ejection fraction: 55% (Date: 06/2017) -HF type: Systolic -NYHA Class: II (slight limitation of activity) -Current treatment: Entresto 24-26 mg bid Carvedilol 6.25 mg bid  -Medications previously tried: Entresto higher doses -Current home BP/HR readings: 120-130/75-80 Pulse 70-80 -Current dietary habits: watches sodium and purchases no salt added products. Mainly cooks at home.  -Current exercise habits: no formal exercise currently. Has joined the [facility] and hopes to begin pool exercises.  -Educated on Benefits of medications for managing symptoms and prolonging life Importance of blood pressure control -Counseled on diet and exercise extensively Recommended to continue current medication Recommend continuing to monitor heart rate and blood pressure to avoid hypotension.   COPD (Goal: control symptoms and prevent exacerbations) -Controlled -Current treatment  Albuterol inhaler 2 puffs into the lungs eery 6 hours prn wheezing or shortness of breath Stiolto 2 puffs daily  -Medications previously tried:  tudorza, symbicort  -Gold Grade: Gold 3 -Pulmonary function testing: 2015 fev1  54% -Exacerbations requiring treatment in last 6 months: none reported -Patient reports consistent use of maintenance inhaler -Frequency of rescue inhaler use: rarely but has one in case needed -Counseled on Benefits of consistent maintenance inhaler use Differences between maintenance and rescue inhalers -Recommended to continue current medication  Depression/Anxiety (Goal: manage symptoms of anxiety) -Controlled -Current treatment: clonazepam 0.5 mg bid prn anxiety (Uses 2-3x/3 -6 months) Bupropion XL 300 mg daily  -Medications previously tried/failed: none reported -PHQ9:  Depression screen [facility] 2/9 10/15/2020 07/09/2020 01/13/2020  Decreased Interest 0 0 2  Down, Depressed, Hopeless 0 0 2  PHQ - 2 Score 0 0 4  Altered sleeping 0 0 3  Tired, decreased energy 0 2 2  Change in appetite 0 2 2  Feeling bad or failure about yourself  0 1 3  Trouble concentrating 2 1 3   Moving slowly or fidgety/restless 0 0 0  Suicidal thoughts 0 0 0  PHQ-9 Score 2 6 17   Difficult doing work/chores Not difficult at all Somewhat difficult Somewhat difficult  -GAD7:  No flowsheet data found.  -Discussed benefits of exercise, cognitive behavior therapy, meditation or journaling for mental health support -Educated on Benefits of medication for symptom control Benefits of cognitive-behavioral therapy with or without medication -Counseled on diet and exercise extensively Recommended to continue current medication Educated on benefits of symptom management for overall health.   Chronic Back Pain (Goal: manage symptoms) -Hardware in back since 1981 Pain Scale: 10/28/20 With Meds: 4/10 (Content  on therapy and she's able to "do things around the house"_ Without Meds: 5-6/10 -Controlled -Current treatment  tizanidine 4 mg every 6 hours prn  Tramadol 50 mg bid prn  Gabapentin 400 mg 2 capsules am and 2 capsules pm  -Medications previously tried: none reported  -Recommended to continue current  medication Counseled on benefits of pool exercise.   Health Maintenance -Vaccine gaps: fourth COVID recommended when available -Current therapy:  multivitamin daily  Vitamin d 50,000 weekly  Coenzyme q10 200 mg daily  Lutein 20 mg daily  -Educated on Cost vs benefit of each product must be carefully weighed by individual consumer -Patient is satisfied with current therapy and denies issues -Recommended to continue current medication   Patient Goals/Self-Care Activities Patient will:  - take medications as prescribed focus on medication adherence by using pill box check glucose daily, document, and provide at future appointments check blood pressure daily, document, and provide at future appointments target a minimum of 150 minutes of moderate intensity exercise weekly  Follow Up Plan: Telephone follow up appointment with care management team member scheduled for: March 2023      The patient verbalized understanding of instructions, educational materials, and care plan provided today and declined offer to receive copy of patient instructions, educational materials, and care plan.  The pharmacy team will reach out to the patient again over the next 90 days.   Zettie Pho, [facility]

## 2020-10-28 NOTE — Progress Notes (Signed)
Chronic Care Management Pharmacy Note  10/28/2020 Name:  Judy [name] MRN:  [id number] DOB:  26-Aug-1960   Plan Updates:  Will renew PAP's in December  Subjective: Judy [name] is an 60 y.o. year old female who is a primary patient of Cox, Kirsten, MD.  The CCM team was consulted for assistance with disease management and care coordination needs.    Engaged with patient face to face for initial visit in response to provider referral for pharmacy case management and/or care coordination services.   Consent to Services:  The patient was given the following information about Chronic Care Management services today, agreed to services, and gave verbal consent: 1. CCM service includes personalized support from designated clinical staff supervised by the primary care provider, including individualized plan of care and coordination with other care providers 2. 24/7 contact phone numbers for assistance for urgent and routine care needs. 3. Service will only be billed when office clinical staff spend 20 minutes or more in a month to coordinate care. 4. Only one practitioner may furnish and bill the service in a calendar month. 5.The patient may stop CCM services at any time (effective at the end of the month) by phone call to the office staff. 6. The patient will be responsible for cost sharing (co-pay) of up to 20% of the service fee (after annual deductible is met). Patient agreed to services and consent obtained.  Patient Care Team: Rochel Brome, MD as PCP - General (Family Medicine) Sueanne Margarita, MD as PCP - Cardiology (Cardiology) Burnice Logan, [facility] (Inactive) as Pharmacist (Pharmacist)  Recent office visits: 04/27/2020 - recommend adhesive bandage for Libre 2. 04/08/2020 - decrease entresto to 97/103 mg 1/2 tablet bid. Hold carvedilol. Continue to check bp and pulse bid. Increase Trulicity 3 mg weekly. Continue Farxiga 10 mg daily. Free style 2 CGM samples given.  02/14/2020 -  decrease Entresto dose. Push fluids and check bp twice daily. Patient experienced hypotension/dizziness. Decrease Entresto 24/26 bid if SBP >100.  09/62/8366 - continue Trulicity and Farxiga 10 mg daily. Flu shot given.  29/47/6546 - start Trulicity 5.03 mg weekly x 2 weeks then increase to 1.5 mg weekly. Increase Lipitor 40 mg daily. Vascepa 1 gram 2 capslues twice daily. Clonazepam prn panic attacks. Increase tramadol 50 mg bid.  11/14/2019 - pfizer COVID. Bupropion XL 150 mg daily am then increase to 2 in the am.  Recent consult visits: 04/30/2020 - cardiology pharmd for bp management. Encouraged weight loss. Will consider toprol instead of carvedilol if sbp <!10 mmHg.  04/20/2020 - cardio - Entresto 24/26 mg bid and carvedilol 6.25 mg bid.  02/24/2020 - eye visit.  12/25/2019 - cardiology - continue lasix prn.   Hospital visits: None in previous 6 months  Objective:  Lab Results  Component Value Date   CREATININE 0.81 10/13/2020   BUN 12 10/13/2020   GFR 100.87 10/01/2014   GFRNONAA 75 04/06/2020   GFRAA 87 04/06/2020   NA 145 (H) 10/13/2020   K 4.6 10/13/2020   CALCIUM 9.4 10/13/2020   CO2 24 10/13/2020   GLUCOSE 88 10/13/2020    Lab Results  Component Value Date/Time   HGBA1C 6.1 (H) 10/13/2020 11:28 AM   HGBA1C 6.7 (H) 07/10/2020 11:29 AM   GFR 100.87 10/01/2014 10:54 AM   GFR 84.34 05/13/2014 02:32 PM   MICROALBUR Negative 09/03/2019 02:33 PM    Last diabetic Eye exam:  Lab Results  Component Value Date/Time   HMDIABEYEEXA No Retinopathy 02/24/2020  12:00 AM    Last diabetic Foot exam: No results found for: HMDIABFOOTEX   Lab Results  Component Value Date   CHOL 135 10/13/2020   HDL 36 (L) 10/13/2020   LDLCALC 69 10/13/2020   TRIG 176 (H) 10/13/2020   CHOLHDL 3.8 10/13/2020    Hepatic Function Latest Ref Rng & Units 10/13/2020 07/10/2020 05/19/2020  Total Protein 6.0 - 8.5 g/dL 6.4 6.1 6.7  Albumin 3.8 - 4.9 g/dL 4.1 4.1 4.2  AST 0 - 40 IU/L '19 20 19  ' ALT 0  - 32 IU/L 25 30 34(H)  Alk Phosphatase 44 - 121 IU/L 187(H) 155(H) 202(H)  Total Bilirubin 0.0 - 1.2 mg/dL 0.9 0.5 0.8  Bilirubin, Direct 0.0 - 0.3 mg/dL - - -    Lab Results  Component Value Date/Time   TSH 3.070 04/06/2020 02:50 PM   TSH 2.020 07/17/2019 01:06 PM   FREET4 1.19 07/17/2019 01:06 PM    CBC Latest Ref Rng & Units 10/13/2020 07/10/2020 05/19/2020  WBC 3.4 - 10.8 x10E3/uL 6.9 6.6 9.2  Hemoglobin 11.1 - 15.9 g/dL 15.1 13.8 14.9  Hematocrit 34.0 - 46.6 % 45.6 41.6 45.7  Platelets 150 - 450 x10E3/uL 259 217 247    Lab Results  Component Value Date/Time   VD25OH 37.5 07/17/2019 01:06 PM    Clinical ASCVD: No  The 10-year ASCVD risk score (Arnett DK, et al., 2019) is: 5.7%   Values used to calculate the score:     Age: 6 years     Sex: Female     Is Non-Hispanic African American: No     Diabetic: Yes     Tobacco smoker: No     Systolic Blood Pressure: 537 mmHg     Is BP treated: Yes     HDL Cholesterol: 36 mg/dL     Total Cholesterol: 135 mg/dL    Depression screen [facility] 2/9 10/15/2020 07/09/2020 01/13/2020  Decreased Interest 0 0 2  Down, Depressed, Hopeless 0 0 2  PHQ - 2 Score 0 0 4  Altered sleeping 0 0 3  Tired, decreased energy 0 2 2  Change in appetite 0 2 2  Feeling bad or failure about yourself  0 1 3  Trouble concentrating '2 1 3  ' Moving slowly or fidgety/restless 0 0 0  Suicidal thoughts 0 0 0  PHQ-9 Score '2 6 17  ' Difficult doing work/chores Not difficult at all Somewhat difficult Somewhat difficult     Social History   Tobacco Use  Smoking Status Former   Packs/day: 1.00   Years: 15.00   Pack years: 15.00   Types: Cigarettes   Quit date: 02/14/2005   Years since quitting: 15.7  Smokeless Tobacco Never   BP Readings from Last 3 Encounters:  10/15/20 106/70  09/09/20 138/78  08/06/20 98/62   Pulse Readings from Last 3 Encounters:  10/15/20 80  09/09/20 73  07/22/20 88   Wt Readings from Last 3 Encounters:  10/15/20 276 lb (125.2 kg)   09/09/20 280 lb (127 kg)  08/06/20 281 lb (127.5 kg)   BMI Readings from Last 3 Encounters:  10/15/20 45.93 kg/m  09/09/20 46.59 kg/m  08/06/20 46.76 kg/m    Assessment/Interventions: Review of patient past medical history, allergies, medications, health status, including review of consultants reports, laboratory and other test data, was performed as part of comprehensive evaluation and provision of chronic care management services.   SDOH:  (Social Determinants of Health) assessments and interventions performed: Yes SDOH Interventions  Flowsheet Row Most Recent Value  SDOH Interventions   Financial Strain Interventions Other (Comment)  [PAP]      SDOH Screenings   Alcohol Screen: Low Risk    Last Alcohol Screening Score (AUDIT): 1  Depression (PHQ2-9): Low Risk    PHQ-2 Score: 2  Financial Resource Strain: High Risk   Difficulty of Paying Living Expenses: Hard  Food Insecurity: No Food Insecurity   Worried About Charity fundraiser in the Last Year: Never true   Ran Out of Food in the Last Year: Never true  Housing: Low Risk    Last Housing Risk Score: 0  Physical Activity: Inactive   Days of Exercise per Week: 0 days   Minutes of Exercise per Session: 0 min  Social Connections: Not on file  Stress: Not on file  Tobacco Use: Medium Risk   Smoking Tobacco Use: Former   Smokeless Tobacco Use: Never  Transportation Needs: No Data processing manager (Medical): No   Lack of Transportation (Non-Medical): No    CCM Care Plan  Allergies  Allergen Reactions   Demerol [Meperidine] Nausea And Vomiting    Medications Reviewed Today     Reviewed by Lane Hacker, [facility] (Pharmacist) on 10/28/20 at [place] List Status: <None>   Medication Order Taking? Sig Documenting Provider Last Dose Status Informant  albuterol (VENTOLIN HFA) 108 (90 Base) MCG/ACT inhaler [id number] Yes Inhale 2 puffs into the lungs every 6 (six) hours as needed for  wheezing or shortness of breath. CoxElnita Maxwell, MD Taking Active   aspirin 81 MG tablet [id number] Yes Take 81 mg by mouth at bedtime.  [provider] Taking Active Self  atorvastatin (LIPITOR) 20 MG tablet [id number] Yes TAKE 1 TABLET EVERY DAY Marge Duncans, PA-C Taking Active   buPROPion (WELLBUTRIN XL) 300 MG 24 hr tablet [id number] Yes TAKE 1 TABLET EVERY DAY Cox, Kirsten, MD Taking Active   carvedilol (COREG) 6.25 MG tablet [id number] Yes Take 1 tablet (6.25 mg total) by mouth 2 (two) times daily. Sueanne Margarita, MD Taking Active   clonazePAM Bobbye [place]) 0.5 MG tablet [id number] Yes Take 1 tablet (0.5 mg total) by mouth 2 (two) times daily as needed for anxiety (panic attacks). Cox, Kirsten, MD Taking Active   Coenzyme Q10 (CO Q 10) 100 MG CAPS [id number]  Take 200 mg by mouth daily. [provider]  Active   diphenhydrAMINE (BENADRYL) 25 MG tablet [id number]  Take 50 mg by mouth at bedtime as needed. [provider]  Active   Dulaglutide (TRULICITY) 3 CZ/6.6AY SOPN [id number] Yes Inject 3 mg as directed once a week. Cox, Kirsten, MD Taking Active   FARXIGA 10 MG TABS tablet [id number] Yes Take 1 tablet (10 mg total) by mouth daily. Cox, Kirsten, MD Taking Active   gabapentin (NEURONTIN) 400 MG capsule [id number] Yes TAKE 2 CAPSULES IN THE MORNING AND TAKE 2 CAPSULES IN THE Julio Alm, Kirsten, MD Taking Active   icosapent Ethyl (VASCEPA) 1 g capsule [id number] Yes Take 2 capsules (2 g total) by mouth 2 (two) times daily. Cox, Kirsten, MD Taking Active   levothyroxine (SYNTHROID) 75 MCG tablet [id number] Yes TAKE 1 TABLET (75 MCG TOTAL) BY MOUTH DAILY. Marge Duncans, PA-C Taking Active   Lutein 20 MG TABS [id number]  Take 1 tablet by mouth daily. [provider]  Active   Multiple Vitamin (MULTIVITAMIN WITH MINERALS) TABS tablet [id number]  Take 1 tablet by mouth daily. [provider]  Active Self  OXYGEN [id number]  2lpm 2 lpm as needed [provider]   Active   sacubitril-valsartan (ENTRESTO) 24-26 MG [id number] Yes Take 1 tablet by mouth 2 (two) times daily. Sueanne Margarita, MD Taking Active   Tiotropium Bromide-Olodaterol (STIOLTO RESPIMAT) 2.5-2.5 MCG/ACT AERS [id number] Yes INHALE 2 PUFFS INTO THE LUNGS DAILY. Tanda Rockers, MD Taking Active   tiZANidine (ZANAFLEX) 4 MG tablet [id number] Yes TAKE 1 TABLET EVERY 6 HOURS AS NEEDED FOR MUSCLE SPASM(S) Cox, Kirsten, MD Taking Active   traMADol (ULTRAM) 50 MG tablet [id number] Yes Take 1 tablet (50 mg total) by mouth 2 (two) times daily. Rochel Brome, MD Taking Active   UNABLE TO FIND [id number]  CPAP with o2 2lpm  DME- [facility] [provider]  Active   Vitamin D, Ergocalciferol, (DRISDOL) 1.25 MG (50000 UNIT) CAPS capsule [id number]  TAKE 1 CAPSULE EVERY 7 DAYS Cox, Kirsten, MD  Active             Patient Active Problem List   Diagnosis Date Noted   Anxiety 09/03/2019   Arthritis 09/03/2019   Depression 09/03/2019   Elevated BP 09/03/2019   H/O scoliosis 09/03/2019   History of migraine headaches 09/03/2019   Neuropathy 09/03/2019   Post-menopausal 09/03/2019   Seasonal allergies 09/03/2019   Vitamin D deficiency 09/03/2019   Congestive heart failure ([place]) 09/03/2019   Other specified hypothyroidism 09/03/2019   Hyperlipidemia associated with type 2 diabetes mellitus ([place]) 07/30/2019   OSA (obstructive sleep apnea)    Leg swelling 02/58/5277   Chronic systolic CHF (congestive heart failure), NYHA class 2 ([place]) 10/07/2014   Morbid obesity due to excess calories ([place]) 08/23/2014   DCM (dilated cardiomyopathy) ([place]) 04/25/2014   COPD (chronic obstructive pulmonary disease) ([place])    Chronic respiratory failure with hypoxia ([place]) 04/10/2014   CAP (community acquired pneumonia) 02/21/2014   Cough 01/08/2014   Essential hypertension 10/26/2013   COPD GOLD  III  10/09/2013   Pulmonary infiltrates only seen on CT chest 10/07/13  10/09/2013   DOE (dyspnea on exertion) 10/08/2013    Ganglion cyst 10/11/2012   Other ganglion and cyst of synovium, tendon, and bursa(727.49) 09/20/2012   Degenerative arthritis of hip 07/26/2012   Foot pain, bilateral 05/31/2012   Hip pain, right 05/31/2012   Facet arthropathy, lumbar 03/08/2012   Flatback syndrome 03/08/2012   Long term current use of opiate analgesic 03/08/2012   Pain syndrome, chronic 03/08/2012   Knee pain 01/06/2010   Mid back pain 01/06/2010   Neck pain 01/06/2010   Transfusion history 11/14/2006    Immunization History  Administered Date(s) Administered   Influenza Inj Mdck Quad Pf 01/13/2020, 10/15/2020   Influenza Split 10/15/2013, 11/24/2014   Influenza-Unspecified 11/14/2012, 11/04/2018   PFIZER(Purple Top)SARS-COV-2 Vaccination 05/09/2019, 06/03/2019, 11/14/2019   Pneumococcal Polysaccharide-23 12/05/2013, 11/28/2014   Pneumococcal-Unspecified 10/15/2013   Tdap 09/30/2015    Conditions to be addressed/monitored:  Hypertension, Hyperlipidemia, Diabetes, COPD, Hypothyroidism, Depression, Anxiety, Osteoarthritis and vitamin d deficiency.   Care Plan : [place]  Updates made by Lane Hacker, RPH since 10/28/2020 12:00 AM     Problem: dm, hld, chf   Priority: High  Onset Date: 05/14/2020     Long-Range Goal: Patient-Specific Goal   Start Date: 05/14/2020  Expected End Date: 05/14/2021  Recent Progress: On track  Priority: High  Note:    Current Barriers:  Unable to independently afford treatment regimen  Pharmacist Clinical Goal(s):  Patient will verbalize ability to afford treatment regimen through collaboration  with PharmD and provider.   Interventions: 1:1 collaboration with Cox, Elnita Maxwell, MD regarding development and update of comprehensive plan of care as evidenced by provider attestation and co-signature Inter-disciplinary care team collaboration (see longitudinal plan of care) Comprehensive medication review performed; medication list updated in electronic medical  record  Hyperlipidemia: (LDL goal < 70) Lab Results  Component Value Date   CHOL 135 10/13/2020   CHOL 135 07/10/2020   CHOL 154 04/06/2020   Lab Results  Component Value Date   HDL 36 (L) 10/13/2020   HDL 38 (L) 07/10/2020   HDL 42 04/06/2020   Lab Results  Component Value Date   LDLCALC 69 10/13/2020   [place] 70 07/10/2020   [place] 83 04/06/2020   Lab Results  Component Value Date   TRIG 176 (H) 10/13/2020   TRIG 154 (H) 07/10/2020   TRIG 170 (H) 04/06/2020   Lab Results  Component Value Date   CHOLHDL 3.8 10/13/2020   CHOLHDL 3.6 07/10/2020   CHOLHDL 3.7 04/06/2020  No results found for: LDLDIRECT -Not ideally controlled -Current treatment: vascepa 1 gram 2 capsules twice daily  Atorvastatin 20 mg daily  Aspirin 81 mg daily at bedtime  -Medications previously tried: none reported  -Current dietary patterns: cooks at home and watches salt intake -Current exercise habits: no formal exercise but has joined [employer] and hopes to begin working out in the pool -Educated on Cholesterol goals;  Benefits of statin for ASCVD risk reduction; Importance of limiting foods high in cholesterol; Exercise goal of 150 minutes per week; -Counseled on diet and exercise extensively Recommended to continue current medication Assessed cost of Vascepa. Coordinated [employer]. [employer] cannot secondary bill Vascepa to [employer] so pharmacist coordinating fill [facility]pa [facility].   Diabetes (A1c goal <7%) Lab Results  Component Value Date   HGBA1C 6.1 (H) 10/13/2020   HGBA1C 6.7 (H) 07/10/2020   HGBA1C 7.3 (H) 04/06/2020   Lab Results  Component Value Date   MICROALBUR Negative 09/03/2019   LDLCALC 69 10/13/2020   CREATININE 0.81 10/13/2020   Lab Results  Component Value Date   NA 145 (H) 10/13/2020   K 4.6 10/13/2020   CREATININE 0.81 10/13/2020   GFRNONAA 75 04/06/2020   GFRAA 87 04/06/2020   GLUCOSE 88 10/13/2020   Lab Results  Component Value Date    WBC 6.9 10/13/2020   HGB 15.1 10/13/2020   HCT 45.6 10/13/2020   MCV 92 10/13/2020   PLT 259 10/13/2020  -controlled -Current medications: Trulicity 3 mg weekly  Farxiga 10 mg daily  -Medications previously tried: none reported  -Current home glucose readings fasting glucose: 106-124  post prandial glucose: not checking  -Denies hypoglycemic/hyperglycemic symptoms -Current meal patterns:  Patient reports cooking at home mainly. Works to avoid sodium and buys no salt added options. States that she probably doesn't eat as well as she should.  -Current exercise: no formal exercise currently but has joined the [employer] for water aerobics.  -Educated on A1c and blood sugar goals; Complications of diabetes including kidney damage, retinal damage, and cardiovascular disease; Exercise goal of 150 minutes per week; Benefits of routine self-monitoring of blood sugar; -Counseled to check feet daily and get yearly eye exams -Counseled on diet and exercise extensively Recommended to continue current medication Collaborated with [employer] patient assistance to request Trulicity 3 mg weekly dose shipped to patient. Patient has had poor success with Libre 2 sample sticking to her arm. Pharmacist provided patch to assist with this.  Assessed  patient's eligibility for [employer] patient assistance program.  Patient currently receives [place] from [place] and [place]. Discussed benefit for heart failure, kidney and blood sugar management.    Hypothyroid (Goal: manage TSH and symptoms of thyroid disease) Lab Results  Component Value Date   TSH 3.070 04/06/2020  -Controlled -Current treatment  Levothyroxine 75 mcg daily  -Medications previously tried: none reported  -Recommended to continue current medication  Heart Failure (Goal: manage symptoms and prevent exacerbations) -Controlled -Home BP Readings: Tests couple times/month  Normal 110/90 -Last ejection fraction: 55% (Date: 06/2017) -HF type: Systolic -NYHA  Class: II (slight limitation of activity) -Current treatment: Entresto 24-26 mg bid Carvedilol 6.25 mg bid  -Medications previously tried: Entresto higher doses -Current home BP/HR readings: 120-130/75-80 Pulse 70-80 -Current dietary habits: watches sodium and purchases no salt added products. Mainly cooks at home.  -Current exercise habits: no formal exercise currently. Has joined the [facility] and hopes to begin pool exercises.  -Educated on Benefits of medications for managing symptoms and prolonging life Importance of blood pressure control -Counseled on diet and exercise extensively Recommended to continue current medication Recommend continuing to monitor heart rate and blood pressure to avoid hypotension.   COPD (Goal: control symptoms and prevent exacerbations) -Controlled -Current treatment  Albuterol inhaler 2 puffs into the lungs eery 6 hours prn wheezing or shortness of breath Stiolto 2 puffs daily  -Medications previously tried:  tudorza, symbicort  -Gold Grade: Gold 3 -Pulmonary function testing: 2015 fev1 54% -Exacerbations requiring treatment in last 6 months: none reported -Patient reports consistent use of maintenance inhaler -Frequency of rescue inhaler use: rarely but has one in case needed -Counseled on Benefits of consistent maintenance inhaler use Differences between maintenance and rescue inhalers -Recommended to continue current medication  Depression/Anxiety (Goal: manage symptoms of anxiety) -Controlled -Current treatment: clonazepam 0.5 mg bid prn anxiety (Uses 2-3x/3 -6 months) Bupropion XL 300 mg daily  -Medications previously tried/failed: none reported -PHQ9:  Depression screen [facility] 2/9 10/15/2020 07/09/2020 01/13/2020  Decreased Interest 0 0 2  Down, Depressed, Hopeless 0 0 2  PHQ - 2 Score 0 0 4  Altered sleeping 0 0 3  Tired, decreased energy 0 2 2  Change in appetite 0 2 2  Feeling bad or failure about yourself  0 1 3  Trouble concentrating '2 1 3   ' Moving slowly or fidgety/restless 0 0 0  Suicidal thoughts 0 0 0  PHQ-9 Score '2 6 17  ' Difficult doing work/chores Not difficult at all Somewhat difficult Somewhat difficult  -GAD7:  No flowsheet data found.  -Discussed benefits of exercise, cognitive behavior therapy, meditation or journaling for mental health support -Educated on Benefits of medication for symptom control Benefits of cognitive-behavioral therapy with or without medication -Counseled on diet and exercise extensively Recommended to continue current medication Educated on benefits of symptom management for overall health.   Chronic Back Pain (Goal: manage symptoms) -Hardware in back since 1981 Pain Scale: 10/28/20 With Meds: 4/10 (Content on therapy and she's able to "do things around the house"_ Without Meds: 5-6/10 -Controlled -Current treatment  tizanidine 4 mg every 6 hours prn  Tramadol 50 mg bid prn  Gabapentin 400 mg 2 capsules am and 2 capsules pm  -Medications previously tried: none reported  -Recommended to continue current medication Counseled on benefits of pool exercise.   Health Maintenance -Vaccine gaps: fourth COVID recommended when available -Current therapy:  multivitamin daily  Vitamin d 50,000 weekly  Coenzyme q10 200 mg daily  Lutein 20  mg daily  -Educated on Cost vs benefit of each product must be carefully weighed by individual consumer -Patient is satisfied with current therapy and denies issues -Recommended to continue current medication   Patient Goals/Self-Care Activities Patient will:  - take medications as prescribed focus on medication adherence by using pill box check glucose daily, document, and provide at future appointments check blood pressure daily, document, and provide at future appointments target a minimum of 150 minutes of moderate intensity exercise weekly  Follow Up Plan: Telephone follow up appointment with care management team member scheduled for: March  2023      Medication Assistance:  Trulicity, Farxigaobtained through [place] and [place] and [place]. medication assistance program.  Enrollment ends 02/13/2021  Patient's preferred pharmacy is:  [facility] - [place], [place] [place] [address] Phone: [phone] Fax: [phone]  [id number] [id number]: [phone]: [phone] Uses pill box? Yes - prepared 3 weeks of pill organizers at a time Pt endorses excellent compliance  We discussed: Current pharmacy is preferred with insurance plan and patient is satisfied with pharmacy services Patient decided to: Continue current medication management strategy  Care Plan and Follow Up Patient Decision:  Patient agrees to Care Plan and Follow-up.  Plan: Telephone follow up appointment with care management team member scheduled for:  March 2023

## 2020-10-30 DIAGNOSIS — G4733 Obstructive sleep apnea (adult) (pediatric): Secondary | ICD-10-CM | POA: Diagnosis not present

## 2020-11-01 ENCOUNTER — Other Ambulatory Visit: Payer: Self-pay | Admitting: Family Medicine

## 2020-11-01 DIAGNOSIS — E7849 Other hyperlipidemia: Secondary | ICD-10-CM

## 2020-11-01 DIAGNOSIS — G4733 Obstructive sleep apnea (adult) (pediatric): Secondary | ICD-10-CM | POA: Diagnosis not present

## 2020-11-09 DIAGNOSIS — J4520 Mild intermittent asthma, uncomplicated: Secondary | ICD-10-CM | POA: Diagnosis not present

## 2020-11-09 DIAGNOSIS — J449 Chronic obstructive pulmonary disease, unspecified: Secondary | ICD-10-CM | POA: Diagnosis not present

## 2020-11-10 ENCOUNTER — Other Ambulatory Visit: Payer: Self-pay

## 2020-11-10 ENCOUNTER — Encounter: Payer: Self-pay | Admitting: Family Medicine

## 2020-11-10 ENCOUNTER — Ambulatory Visit: Payer: Medicare HMO

## 2020-11-10 ENCOUNTER — Ambulatory Visit (INDEPENDENT_AMBULATORY_CARE_PROVIDER_SITE_OTHER): Payer: Medicare HMO | Admitting: Family Medicine

## 2020-11-10 VITALS — BP 124/70 | HR 84 | Temp 97.4°F | Resp 18 | Wt 276.0 lb

## 2020-11-10 DIAGNOSIS — M25831 Other specified joint disorders, right wrist: Secondary | ICD-10-CM | POA: Insufficient documentation

## 2020-11-10 DIAGNOSIS — M542 Cervicalgia: Secondary | ICD-10-CM | POA: Diagnosis not present

## 2020-11-10 DIAGNOSIS — Z23 Encounter for immunization: Secondary | ICD-10-CM

## 2020-11-10 MED ORDER — MELOXICAM 15 MG PO TABS: 15.0000 mg | ORAL_TABLET | Freq: Every day | ORAL | 0 refills | Status: DC

## 2020-11-10 NOTE — Patient Instructions (Addendum)
Start meloxicam 15 mg once daily.  May use tylenol or tramadol.  If not improving with medicine and gliding/flossing exercises, call back and will order prednisone.  Cervical neck xray order given.  Cubital Tunnel Syndrome Cubital tunnel syndrome is a condition that causes pain and weakness of the forearm and hand. It happens when one of the nerves that runs along the inside of the elbow joint (ulnar nerve) becomes irritated. This condition is usually caused by repeated arm motions that are done during sports or work-related activities. What are the causes? This condition may be caused by: Increased pressure on the ulnar nerve at the elbow, arm, or forearm. This can result from: Irritation caused by repeated elbow bending. Poorly healed elbow fractures. Tumors in the elbow. These are usually noncancerous (benign). Scar tissue that develops in the elbow after an injury. Bony growths (spurs) near the ulnar nerve. Stretching of the nerve due to loose elbow ligaments. Trauma to the nerve at the elbow. What increases the risk? The following factors may make you more likely to develop this condition: Doing manual labor that requires frequent bending of the elbow. Playing sports that include repeated or strenuous throwing motions, such as baseball. Playing contact sports, such as football or lacrosse. Not warming up properly before activities. Having diabetes. Having an underactive thyroid (hypothyroidism). What are the signs or symptoms? Symptoms of this condition include: Clumsiness or weakness of the hand. Tenderness of the inner elbow. Aching or soreness of the inner elbow, forearm, or fingers, especially the little finger or the ring finger. Increased pain when forcing the elbow to bend. Reduced control when throwing objects. Tingling, numbness, or a burning feeling inside the forearm or in part of the hand or fingers, especially the little finger or the ring finger. Sharp pains that  shoot from the elbow down to the wrist and hand. The inability to grip or pinch hard. How is this diagnosed? This condition is diagnosed based on: Your symptoms and medical history. Your health care provider will also ask for details about any injury. A physical exam. You may also have tests, including: Electromyogram (EMG). This test measures electrical signals sent by your nerves into the muscles. Nerve conduction study. This test measures how well electrical signals pass through your nerves. Imaging tests, such as X-rays, ultrasound, and MRI. These tests check for possible causes of your condition. How is this treated? This condition may be treated by: Stopping the activities that are causing your symptoms to get worse. Icing and taking medicines to reduce pain and swelling. Wearing a splint to prevent your elbow from bending, or wearing an elbow pad where the ulnar nerve is closest to the skin. Working with a physical therapist in less severe cases. This may help to: Decrease your symptoms. Improve the strength and range of motion of your elbow, forearm, and hand. If these treatments do not help, surgery may be needed. Follow these instructions at home: If you have a splint: Wear the splint as told by your health care provider. Remove it only as told by your health care provider. Loosen the splint if your fingers tingle, become numb, or turn cold and blue. Keep the splint clean. If the splint is not waterproof: Do not let it get wet. Cover it with a watertight covering when you take a bath or shower. Managing pain, stiffness, and swelling  If directed, put ice on the injured area: Put ice in a plastic bag. Place a towel between your skin and the  bag. Leave the ice on for 20 minutes, 2-3 times a day. Move your fingers often to avoid stiffness and to lessen swelling. Raise (elevate) the injured area above the level of your heart while you are sitting or lying down. General  instructions Take over-the-counter and prescription medicines only as told by your health care provider. Do any exercise or physical therapy as told by your health care provider. Do not drive or use heavy machinery while taking prescription pain medicine. If you were given an elbow pad, wear it as told by your health care provider. Keep all follow-up visits as told by your health care provider. This is important. Contact a health care provider if: Your symptoms get worse. Your symptoms do not get better with treatment. You have new pain. Your hand on the injured side feels numb or cold. Summary Cubital tunnel syndrome is a condition that causes pain and weakness of the forearm and hand. You are more likely to develop this condition if you do work or play sports that involve repeated arm movements. This condition is often treated by stopping repetitive activities, applying ice, and using anti-inflammatory medicines. In rare cases, surgery may be needed. This information is not intended to replace advice given to you by your health care provider. Make sure you discuss any questions you have with your health care provider. Document Revised: 03/25/2020 Document Reviewed: 03/25/2020 Elsevier Patient Education  2022 [employer].

## 2020-11-10 NOTE — Assessment & Plan Note (Signed)
Start meloxicam 15 mg once daily.  May use tylenol or tramadol.  If not improving with medicine and gliding/flossing exercises, call back and will order prednisone.  Cervical neck xray order given. 

## 2020-11-10 NOTE — Progress Notes (Signed)
Acute Office Visit  Subjective:    Patient ID: Judy [name], female    DOB: [birth year], 60 y.o.   MRN: [id number]  Chief Complaint  Patient presents with   Neck Pain   Numbness    Right hand    HPI Patient is in today for right arm numbness along the ulnar nerve distribution.  This is been going on since Friday.  It tingles and aches and is now.  Patient is not dropping anything.  She has no muscle weakness.  Patient does have neck pain that she has had for a long time however she does have a spasm medial to her right scapula.  Patient has chronic back pain.  Past Medical History:  Diagnosis Date   Anxiety    Back pain    Bilateral swelling of feet    Chronic combined systolic and diastolic CHF (congestive heart failure) ([place]) 10/07/2014   COPD (chronic obstructive pulmonary disease) ([facility])    DCM (dilated cardiomyopathy) ([facility]) 04/25/2014   EF 28% by MRI despite maximum medical therapy   Depression    Diabetes mellitus without complication ([place])    Epilepsy ([place])    as a child   Fatty liver    Former tobacco use    Hyperlipidemia    Hypertension    Hypothyroidism    Joint pain    Leg swelling    Morbid obesity ([facility])    NICM (nonischemic cardiomyopathy) (H[facility])    OSA (obstructive sleep apnea)    moderate with AHI 26/hr with oxygen desaturations as low as 70%   Scoliosis    Sleep apnea    SOB (shortness of breath)     Past Surgical History:  Procedure Laterality Date   ABDOMINAL HYSTERECTOMY     ankle artery involved cyst removal     CARDIAC CATHETERIZATION  05/14/2014   normal coronary arteries   CARPAL TUNNEL RELEASE     FASCIOTOMY     1993 for platar fasciitis   harrington rod scoliosis     1981   LEFT HEART CATHETERIZATION WITH CORONARY ANGIOGRAM N/A 05/14/2014   Procedure: LEFT HEART CATHETERIZATION WITH CORONARY ANGIOGRAM;  Surgeon: Wellington Hampshire, MD;  Location: [facility];  Service: Cardiovascular;  Laterality: N/A;   ROTATOR CUFF REPAIR      UMBILICAL HERNIA REPAIR     VESICOVAGINAL FISTULA CLOSURE W/ TAH      Family History  Problem Relation Age of Onset   Emphysema Father    Allergies Father    Heart failure Father    Heart disease Father 77       MI   Diabetes Father    Hyperlipidemia Father    Hypertension Father    Thyroid disease Father    Alcohol abuse Father    Obesity Father    Diabetes Mother    Hyperlipidemia Mother    Obesity Mother     Social History   Socioeconomic History   Marital status: Married    Spouse name: Not on file   Number of children: Not on file   Years of education: Not on file   Highest education level: Not on file  Occupational History   Occupation: disabled  Tobacco Use   Smoking status: Former    Packs/day: 1.00    Years: 15.00    Pack years: 15.00    Types: Cigarettes    Quit date: 02/14/2005    Years since quitting: 15.7   Smokeless tobacco: Never  Vaping  Use   Vaping Use: Never used  Substance and Sexual Activity   Alcohol use: Yes    Comment: occ   Drug use: No   Sexual activity: Not on file  Other Topics Concern   Not on file  Social History Narrative   Lives in [place] with spouse and son.   Previously worked as a Engineer, manufacturing         Social Determinants of Radio broadcast assistant Strain: High Risk   Difficulty of Paying Living Expenses: Hard  Food Insecurity: No Food Insecurity   Worried About Charity fundraiser in the Last Year: Never true   Arboriculturist in the Last Year: Never true  Transportation Needs: No Transportation Needs   Lack of Transportation (Medical): No   Lack of Transportation (Non-Medical): No  Physical Activity: Inactive   Days of Exercise per Week: 0 days   Minutes of Exercise per Session: 0 min  Stress: Not on file  Social Connections: Not on file  Intimate Partner Violence: Not on file    Outpatient Medications Prior to Visit  Medication Sig Dispense Refill   albuterol (VENTOLIN HFA) 108 (90  Base) MCG/ACT inhaler Inhale 2 puffs into the lungs every 6 (six) hours as needed for wheezing or shortness of breath. 8 g 2   aspirin 81 MG tablet Take 81 mg by mouth at bedtime.      atorvastatin (LIPITOR) 20 MG tablet TAKE 1 TABLET EVERY DAY 90 tablet 0   buPROPion (WELLBUTRIN XL) 300 MG 24 hr tablet TAKE 1 TABLET EVERY DAY 90 tablet 3   carvedilol (COREG) 6.25 MG tablet Take 1 tablet (6.25 mg total) by mouth 2 (two) times daily. 180 tablet 3   clonazePAM (KLONOPIN) 0.5 MG tablet Take 1 tablet (0.5 mg total) by mouth 2 (two) times daily as needed for anxiety (panic attacks). 180 tablet 0   Coenzyme Q10 (CO Q 10) 100 MG CAPS Take 200 mg by mouth daily.     diphenhydrAMINE (BENADRYL) 25 MG tablet Take 50 mg by mouth at bedtime as needed.     Dulaglutide (TRULICITY) 3 HY/0.7PX SOPN Inject 3 mg as directed once a week. 2 mL 0   FARXIGA 10 MG TABS tablet Take 1 tablet (10 mg total) by mouth daily. 90 tablet 3   gabapentin (NEURONTIN) 400 MG capsule TAKE 2 CAPSULES IN THE MORNING AND TAKE 2 CAPSULES IN THE EVENING 360 capsule 3   levothyroxine (SYNTHROID) 75 MCG tablet TAKE 1 TABLET (75 MCG TOTAL) BY MOUTH DAILY. 90 tablet 1   Lutein 20 MG TABS Take 1 tablet by mouth daily.     Multiple Vitamin (MULTIVITAMIN WITH MINERALS) TABS tablet Take 1 tablet by mouth daily.     OXYGEN 2lpm 2 lpm as needed     sacubitril-valsartan (ENTRESTO) 24-26 MG Take 1 tablet by mouth 2 (two) times daily. 180 tablet 3   Tiotropium Bromide-Olodaterol (STIOLTO RESPIMAT) 2.5-2.5 MCG/ACT AERS INHALE 2 PUFFS INTO THE LUNGS DAILY. 12 g 5   tiZANidine (ZANAFLEX) 4 MG tablet TAKE 1 TABLET EVERY 6 HOURS AS NEEDED FOR MUSCLE SPASM(S) 360 tablet 1   traMADol (ULTRAM) 50 MG tablet Take 1 tablet (50 mg total) by mouth 2 (two) times daily. 60 tablet 2   UNABLE TO FIND CPAP with o2 2lpm  DME- [facility]     VASCEPA 1 g capsule TAKE 2 CAPSULES BY MOUTH 2 TIMES DAILY. 360 capsule 1   Vitamin D, Ergocalciferol, (  DRISDOL) 1.25 MG (50000 UNIT)  CAPS capsule TAKE 1 CAPSULE EVERY 7 DAYS 12 capsule 3   No facility-administered medications prior to visit.    Allergies  Allergen Reactions   Demerol [Meperidine] Nausea And Vomiting    Review of Systems  Constitutional:  Negative for fever.  Respiratory:  Positive for shortness of breath. Negative for cough.   Cardiovascular:  Negative for chest pain.  Musculoskeletal:  Positive for arthralgias (right elbow) and neck pain.  Neurological:  Positive for numbness (right hand).      Objective:    Physical Exam Vitals reviewed.  Constitutional:      Appearance: Normal appearance. She is obese.  Musculoskeletal:        General: Tenderness (medial rt scapula) present.  Neurological:     Mental Status: She is alert.     Sensory: Sensory deficit (rt medial elbow to forearm into 4th and 5th digit.) present.    BP 124/70   Pulse 84   Temp (!) 97.4 F (36.3 C)   Resp 18   Wt 276 lb (125.2 kg)   BMI 45.93 kg/m  Wt Readings from Last 3 Encounters:  11/10/20 276 lb (125.2 kg)  10/15/20 276 lb (125.2 kg)  09/09/20 280 lb (127 kg)    Health Maintenance Due  Topic Date Due   HIV Screening  Never done   Hepatitis C Screening  Never done   Zoster Vaccines- Shingrix (1 of 2) Never done   COVID-19 Vaccine (4 - Booster for Pfizer series) 02/06/2020   MAMMOGRAM  09/16/2020    There are no preventive care reminders to display for this patient.   Lab Results  Component Value Date   TSH 3.070 04/06/2020   Lab Results  Component Value Date   WBC 6.9 10/13/2020   HGB 15.1 10/13/2020   HCT 45.6 10/13/2020   MCV 92 10/13/2020   PLT 259 10/13/2020   Lab Results  Component Value Date   NA 145 (H) 10/13/2020   K 4.6 10/13/2020   CO2 24 10/13/2020   GLUCOSE 88 10/13/2020   BUN 12 10/13/2020   CREATININE 0.81 10/13/2020   BILITOT 0.9 10/13/2020   ALKPHOS 187 (H) 10/13/2020   AST 19 10/13/2020   ALT 25 10/13/2020   PROT 6.4 10/13/2020   ALBUMIN 4.1 10/13/2020    CALCIUM 9.4 10/13/2020   ANIONGAP 13 09/24/2018   EGFR 83 10/13/2020   GFR 100.87 10/01/2014   Lab Results  Component Value Date   CHOL 135 10/13/2020   Lab Results  Component Value Date   HDL 36 (L) 10/13/2020   Lab Results  Component Value Date   LDLCALC 69 10/13/2020   Lab Results  Component Value Date   TRIG 176 (H) 10/13/2020   Lab Results  Component Value Date   CHOLHDL 3.8 10/13/2020   Lab Results  Component Value Date   HGBA1C 6.1 (H) 10/13/2020       Assessment & Plan:   Problem List Items Addressed This Visit       Other   Neck pain - Primary   Relevant Medications   meloxicam (MOBIC) 15 MG tablet   Other Relevant Orders   DG Cervical Spine 2 or 3 views   Other Visit Diagnoses     Ulnar impingement syndrome of right upper extremity       Relevant Medications   meloxicam (MOBIC) 15 MG tablet        Follow-up: No follow-ups on file.  An  After Visit Summary was printed and given to the patient.  Rochel Brome, MD [facility] [phone]

## 2020-11-10 NOTE — Assessment & Plan Note (Signed)
Start meloxicam 15 mg once daily.  May use tylenol or tramadol.  If not improving with medicine and gliding/flossing exercises, call back and will order prednisone.  Cervical neck xray order given.

## 2020-11-11 ENCOUNTER — Encounter: Payer: Self-pay | Admitting: Family Medicine

## 2020-11-11 NOTE — Telephone Encounter (Signed)
Husband called concerned about pt. Please advise.   Judy [name] 11/11/20 2:39 PM

## 2020-11-12 ENCOUNTER — Ambulatory Visit
Admission: RE | Admit: 2020-11-12 | Discharge: 2020-11-12 | Disposition: A | Discharge status: Home / Self Care | Payer: Medicare HMO | Source: Ambulatory Visit | Attending: Family Medicine | Admitting: Family Medicine

## 2020-11-12 DIAGNOSIS — M542 Cervicalgia: Secondary | ICD-10-CM | POA: Diagnosis not present

## 2020-11-13 DIAGNOSIS — E038 Other specified hypothyroidism: Secondary | ICD-10-CM

## 2020-11-13 DIAGNOSIS — E1165 Type 2 diabetes mellitus with hyperglycemia: Secondary | ICD-10-CM

## 2020-11-13 DIAGNOSIS — E1169 Type 2 diabetes mellitus with other specified complication: Secondary | ICD-10-CM | POA: Diagnosis not present

## 2020-11-13 DIAGNOSIS — I10 Essential (primary) hypertension: Secondary | ICD-10-CM | POA: Diagnosis not present

## 2020-11-13 DIAGNOSIS — E785 Hyperlipidemia, unspecified: Secondary | ICD-10-CM

## 2020-11-17 ENCOUNTER — Other Ambulatory Visit: Payer: Self-pay

## 2020-11-17 DIAGNOSIS — M5412 Radiculopathy, cervical region: Secondary | ICD-10-CM

## 2020-11-18 ENCOUNTER — Other Ambulatory Visit: Payer: Self-pay | Admitting: Family Medicine

## 2020-11-18 DIAGNOSIS — M5412 Radiculopathy, cervical region: Secondary | ICD-10-CM

## 2020-11-21 DIAGNOSIS — R1110 Vomiting, unspecified: Secondary | ICD-10-CM | POA: Diagnosis not present

## 2020-11-21 DIAGNOSIS — Z20828 Contact with and (suspected) exposure to other viral communicable diseases: Secondary | ICD-10-CM | POA: Diagnosis not present

## 2020-11-21 DIAGNOSIS — K591 Functional diarrhea: Secondary | ICD-10-CM | POA: Diagnosis not present

## 2020-11-21 DIAGNOSIS — K529 Noninfective gastroenteritis and colitis, unspecified: Secondary | ICD-10-CM | POA: Diagnosis not present

## 2020-11-21 DIAGNOSIS — R197 Diarrhea, unspecified: Secondary | ICD-10-CM | POA: Diagnosis not present

## 2020-11-21 DIAGNOSIS — R051 Acute cough: Secondary | ICD-10-CM | POA: Diagnosis not present

## 2020-11-21 DIAGNOSIS — E119 Type 2 diabetes mellitus without complications: Secondary | ICD-10-CM | POA: Diagnosis not present

## 2020-11-26 ENCOUNTER — Telehealth: Payer: Self-pay

## 2020-11-26 ENCOUNTER — Encounter: Payer: Self-pay | Admitting: Family Medicine

## 2020-11-26 NOTE — Chronic Care Management (AMB) (Signed)
Chronic Care Management Pharmacy Assistant   Name: Judy [name]  MRN: [id number] DOB: Jan 14, 1961  Reason for Encounter: Disease State call for Diabetes    Recent office visits:  11/11/20 Patient Message. Cox, Kirsten MD. Was instructed to hold the meloxicam for a few days and restart 1/2 pill daily with food.  11/10/20 Blane Ohara MD. Seen for Neck Pain. Xray ordered. Started Meloxicam 15 mg daily.  Recent consult visits:  None since 10/28/20  Hospital visits:  None since 10/28/20  Medications: Outpatient Encounter Medications as of 11/26/2020  Medication Sig   albuterol (VENTOLIN HFA) 108 (90 Base) MCG/ACT inhaler Inhale 2 puffs into the lungs every 6 (six) hours as needed for wheezing or shortness of breath.   aspirin 81 MG tablet Take 81 mg by mouth at bedtime.    atorvastatin (LIPITOR) 20 MG tablet TAKE 1 TABLET EVERY DAY   buPROPion (WELLBUTRIN XL) 300 MG 24 hr tablet TAKE 1 TABLET EVERY DAY   carvedilol (COREG) 6.25 MG tablet Take 1 tablet (6.25 mg total) by mouth 2 (two) times daily.   clonazePAM (KLONOPIN) 0.5 MG tablet Take 1 tablet (0.5 mg total) by mouth 2 (two) times daily as needed for anxiety (panic attacks).   Coenzyme Q10 (CO Q 10) 100 MG CAPS Take 200 mg by mouth daily.   diphenhydrAMINE (BENADRYL) 25 MG tablet Take 50 mg by mouth at bedtime as needed.   Dulaglutide (TRULICITY) 3 MG/0.5ML SOPN Inject 3 mg as directed once a week.   FARXIGA 10 MG TABS tablet Take 1 tablet (10 mg total) by mouth daily.   gabapentin (NEURONTIN) 400 MG capsule TAKE 2 CAPSULES IN THE MORNING AND TAKE 2 CAPSULES IN THE EVENING   levothyroxine (SYNTHROID) 75 MCG tablet TAKE 1 TABLET (75 MCG TOTAL) BY MOUTH DAILY.   Lutein 20 MG TABS Take 1 tablet by mouth daily.   meloxicam (MOBIC) 15 MG tablet Take 1 tablet (15 mg total) by mouth daily.   Multiple Vitamin (MULTIVITAMIN WITH MINERALS) TABS tablet Take 1 tablet by mouth daily.   OXYGEN 2lpm 2 lpm as needed   sacubitril-valsartan  (ENTRESTO) 24-26 MG Take 1 tablet by mouth 2 (two) times daily.   Tiotropium Bromide-Olodaterol (STIOLTO RESPIMAT) 2.5-2.5 MCG/ACT AERS INHALE 2 PUFFS INTO THE LUNGS DAILY.   tiZANidine (ZANAFLEX) 4 MG tablet TAKE 1 TABLET EVERY 6 HOURS AS NEEDED FOR MUSCLE SPASM(S)   traMADol (ULTRAM) 50 MG tablet Take 1 tablet (50 mg total) by mouth 2 (two) times daily.   UNABLE TO FIND CPAP with o2 2lpm  DME- [facility]   VASCEPA 1 g capsule TAKE 2 CAPSULES BY MOUTH 2 TIMES DAILY.   Vitamin D, Ergocalciferol, (DRISDOL) 1.25 MG (50000 UNIT) CAPS capsule TAKE 1 CAPSULE EVERY 7 DAYS   No facility-administered encounter medications on file as of 11/26/2020.    Recent Relevant Labs: Lab Results  Component Value Date/Time   HGBA1C 6.1 (H) 10/13/2020 11:28 AM   HGBA1C 6.7 (H) 07/10/2020 11:29 AM   MICROALBUR Negative 09/03/2019 02:33 PM    Kidney Function Lab Results  Component Value Date/Time   CREATININE 0.81 10/13/2020 11:28 AM   CREATININE 0.91 07/10/2020 11:29 AM   CREATININE 0.75 12/09/2015 10:57 AM   CREATININE 0.69 10/23/2015 02:37 PM   GFR 100.87 10/01/2014 10:54 AM   GFRNONAA 75 04/06/2020 02:50 PM   GFRAA 87 04/06/2020 02:50 PM     Current antihyperglycemic regimen: Getting through Patient Assistance  Trulicity 3mg /0.73ml Inject 3 mg once a  week  Farxiga 10 mg daily   Patient verbally confirms she is taking the above medications as directed. Yes  What recent interventions/DTPs have been made to improve glycemic control:  Pt stated no changes   Have there been any recent hospitalizations or ED visits since last visit with CPP? No  Patient denies hypoglycemic symptoms, including None  Patient denies hyperglycemic symptoms, including none  How often are you checking your blood sugar? 2-3 times a week   What are your blood sugars ranging? 98-106  On insulin? No How many units:  During the week, how often does your blood glucose drop below 70? Never  Are you checking your feet  daily/regularly? Yes  Adherence Review: Is the patient currently on a STATIN medication? Yes Is the patient currently on ACE/ARB medication? Yes Does the patient have >5 day gap between last estimated fill dates? Patient is getting meds through Patient Assistance  Care Gaps: Last eye exam / Retinopathy Screening? Done on 02/24/20 Last Annual Wellness Visit? Scheduled on 01/26/21 Last Diabetic Foot Exam? Done on 07/09/20   Star Rating Drugs: Patient Assistance  Medication:  Last Fill: Day Supply Trulicity Judy [name], [facility] Clinical Pharmacist Assistant  [phone]

## 2020-12-01 ENCOUNTER — Ambulatory Visit
Admission: RE | Admit: 2020-12-01 | Discharge: 2020-12-01 | Disposition: A | Discharge status: Home / Self Care | Payer: Medicare HMO | Source: Ambulatory Visit | Attending: Family Medicine | Admitting: Family Medicine

## 2020-12-01 ENCOUNTER — Other Ambulatory Visit: Payer: Self-pay

## 2020-12-01 DIAGNOSIS — M5412 Radiculopathy, cervical region: Secondary | ICD-10-CM

## 2020-12-01 DIAGNOSIS — M4312 Spondylolisthesis, cervical region: Secondary | ICD-10-CM | POA: Diagnosis not present

## 2020-12-01 DIAGNOSIS — G4733 Obstructive sleep apnea (adult) (pediatric): Secondary | ICD-10-CM | POA: Diagnosis not present

## 2020-12-01 DIAGNOSIS — M4722 Other spondylosis with radiculopathy, cervical region: Secondary | ICD-10-CM | POA: Diagnosis not present

## 2020-12-01 DIAGNOSIS — M4803 Spinal stenosis, cervicothoracic region: Secondary | ICD-10-CM | POA: Diagnosis not present

## 2020-12-01 DIAGNOSIS — M4802 Spinal stenosis, cervical region: Secondary | ICD-10-CM | POA: Diagnosis not present

## 2020-12-01 MED ORDER — GADOBENATE DIMEGLUMINE 529 MG/ML IV SOLN
20.0000 mL | Freq: Once | INTRAVENOUS | Status: AC | PRN
  Administered 2020-12-01: 20 mL via INTRAVENOUS

## 2020-12-07 ENCOUNTER — Other Ambulatory Visit: Payer: Self-pay | Admitting: Family Medicine

## 2020-12-07 DIAGNOSIS — M25831 Other specified joint disorders, right wrist: Secondary | ICD-10-CM

## 2020-12-07 DIAGNOSIS — M542 Cervicalgia: Secondary | ICD-10-CM

## 2020-12-07 NOTE — Telephone Encounter (Signed)
Refill sent to pharmacy.   

## 2020-12-09 DIAGNOSIS — J4520 Mild intermittent asthma, uncomplicated: Secondary | ICD-10-CM | POA: Diagnosis not present

## 2020-12-09 DIAGNOSIS — J449 Chronic obstructive pulmonary disease, unspecified: Secondary | ICD-10-CM | POA: Diagnosis not present

## 2020-12-30 ENCOUNTER — Telehealth: Payer: Self-pay

## 2020-12-30 NOTE — Chronic Care Management (AMB) (Signed)
Chronic Care Management Pharmacy Assistant   Name: Judy [name]  MRN: [id number] DOB: 1960-06-02   Reason for Encounter: Disease State call for HTN   Recent office visits:  None since 11/26/20  Recent consult visits:  None since 11/26/20  Hospital visits:  None since 11/26/20  Medications: Outpatient Encounter Medications as of 12/30/2020  Medication Sig   albuterol (VENTOLIN HFA) 108 (90 Base) MCG/ACT inhaler Inhale 2 puffs into the lungs every 6 (six) hours as needed for wheezing or shortness of breath.   aspirin 81 MG tablet Take 81 mg by mouth at bedtime.    atorvastatin (LIPITOR) 20 MG tablet TAKE 1 TABLET EVERY DAY   buPROPion (WELLBUTRIN XL) 300 MG 24 hr tablet TAKE 1 TABLET EVERY DAY   carvedilol (COREG) 6.25 MG tablet Take 1 tablet (6.25 mg total) by mouth 2 (two) times daily.   clonazePAM (KLONOPIN) 0.5 MG tablet Take 1 tablet (0.5 mg total) by mouth 2 (two) times daily as needed for anxiety (panic attacks).   Coenzyme Q10 (CO Q 10) 100 MG CAPS Take 200 mg by mouth daily.   diphenhydrAMINE (BENADRYL) 25 MG tablet Take 50 mg by mouth at bedtime as needed.   Dulaglutide (TRULICITY) 3 MG/0.5ML SOPN Inject 3 mg as directed once a week.   FARXIGA 10 MG TABS tablet Take 1 tablet (10 mg total) by mouth daily.   gabapentin (NEURONTIN) 400 MG capsule TAKE 2 CAPSULES IN THE MORNING AND TAKE 2 CAPSULES IN THE EVENING   levothyroxine (SYNTHROID) 75 MCG tablet TAKE 1 TABLET (75 MCG TOTAL) BY MOUTH DAILY.   Lutein 20 MG TABS Take 1 tablet by mouth daily.   meloxicam (MOBIC) 15 MG tablet TAKE 1 TABLET (15 MG TOTAL) BY MOUTH DAILY.   Multiple Vitamin (MULTIVITAMIN WITH MINERALS) TABS tablet Take 1 tablet by mouth daily.   OXYGEN 2lpm 2 lpm as needed   sacubitril-valsartan (ENTRESTO) 24-26 MG Take 1 tablet by mouth 2 (two) times daily.   Tiotropium Bromide-Olodaterol (STIOLTO RESPIMAT) 2.5-2.5 MCG/ACT AERS INHALE 2 PUFFS INTO THE LUNGS DAILY.   tiZANidine (ZANAFLEX) 4 MG tablet  TAKE 1 TABLET EVERY 6 HOURS AS NEEDED FOR MUSCLE SPASM(S)   traMADol (ULTRAM) 50 MG tablet Take 1 tablet (50 mg total) by mouth 2 (two) times daily.   UNABLE TO FIND CPAP with o2 2lpm  DME- [facility]   VASCEPA 1 g capsule TAKE 2 CAPSULES BY MOUTH 2 TIMES DAILY.   Vitamin D, Ergocalciferol, (DRISDOL) 1.25 MG (50000 UNIT) CAPS capsule TAKE 1 CAPSULE EVERY 7 DAYS   No facility-administered encounter medications on file as of 12/30/2020.    Recent Office Vitals: BP Readings from Last 3 Encounters:  11/10/20 124/70  10/15/20 106/70  09/09/20 138/78   Pulse Readings from Last 3 Encounters:  11/10/20 84  10/15/20 80  09/09/20 73    Wt Readings from Last 3 Encounters:  11/10/20 276 lb (125.2 kg)  10/15/20 276 lb (125.2 kg)  09/09/20 280 lb (127 kg)     Kidney Function Lab Results  Component Value Date/Time   CREATININE 0.81 10/13/2020 11:28 AM   CREATININE 0.91 07/10/2020 11:29 AM   CREATININE 0.75 12/09/2015 10:57 AM   CREATININE 0.69 10/23/2015 02:37 PM   GFR 100.87 10/01/2014 10:54 AM   GFRNONAA 75 04/06/2020 02:50 PM   GFRAA 87 04/06/2020 02:50 PM    BMP Latest Ref Rng & Units 10/13/2020 07/10/2020 05/19/2020  Glucose 65 - 99 mg/dL 88 409(W) 119(J)  BUN 8 -  27 mg/dL 12 7 10   Creatinine 0.57 - 1.00 mg/dL 3.26 7.12  BUN/Creat Ratio 12 - 28 15 8(L) 14  Sodium 134 - 144 mmol/L 145(H) 143 142  Potassium 3.5 - 5.2 mmol/L 4.6 4.4 4.2  Chloride 96 - 106 mmol/L 107(H) 106 104  CO2 20 - 29 mmol/L 24 23 25   Calcium 8.7 - 10.3 mg/dL 9.4 9.1 9.4     Current antihypertensive regimen:  Carvedilol 6.25 mg two times daily  Patient verbally confirms she is taking the above medications as directed. Yes  How often are you checking your Blood Pressure? infrequently  she checks her blood pressure in the afternoon after taking her medication.  Current home BP readings: 120/78 132/80   Wrist or arm cuff:Arm  Caffeine intake:No caffeine  Salt intake:Limited  OTC medications including  pseudoephedrine or NSAIDs? Tylenol   Any readings above 180/120? No   What recent interventions/DTPs have been made by any provider to improve Blood Pressure control since last CPP Visit: Pt is not aware of any changes   Any recent hospitalizations or ED visits since last visit with CPP? No recent visits   What diet changes have been made to improve Blood Pressure Control?  Pt stated no changes have been made   What exercise is being done to improve your Blood Pressure Control?  Pt is not currently getting exercise   Adherence Review: Is the patient currently on ACE/ARB medication? Yes Does the patient have >5 day gap between last estimated fill dates? CPP to review  Care Gaps: Last annual wellness visit? Scheduled 01/26/21  Star Rating Drugs:  Medication:  Last Fill: Day Supply Atorvastatin   09/02/20 90ds- Pt has an oversupply due to sending in 20 mg and 40 mg so she just cuts the 40 in half.  Trulicity   (PAP) 01/28/21   (PAP)   09/04/20, CMA Clinical Pharmacist Assistant  [phone]

## 2021-01-03 ENCOUNTER — Other Ambulatory Visit: Payer: Self-pay | Admitting: Family Medicine

## 2021-01-03 DIAGNOSIS — M542 Cervicalgia: Secondary | ICD-10-CM

## 2021-01-03 DIAGNOSIS — E559 Vitamin D deficiency, unspecified: Secondary | ICD-10-CM

## 2021-01-03 DIAGNOSIS — M25831 Other specified joint disorders, right wrist: Secondary | ICD-10-CM

## 2021-01-15 ENCOUNTER — Other Ambulatory Visit: Payer: Self-pay | Admitting: Family Medicine

## 2021-01-15 ENCOUNTER — Telehealth: Payer: Self-pay

## 2021-01-15 ENCOUNTER — Ambulatory Visit: Payer: Medicare HMO | Admitting: Family Medicine

## 2021-01-15 ENCOUNTER — Other Ambulatory Visit: Payer: Medicare HMO

## 2021-01-15 DIAGNOSIS — E1169 Type 2 diabetes mellitus with other specified complication: Secondary | ICD-10-CM | POA: Diagnosis not present

## 2021-01-15 DIAGNOSIS — E785 Hyperlipidemia, unspecified: Secondary | ICD-10-CM

## 2021-01-15 NOTE — Chronic Care Management (AMB) (Signed)
    Chronic Care Management Pharmacy Assistant   Name: Judy [name]  MRN: [id number] DOB: [birth year]  Reason for Encounter: 2023 PAP   Medications: Outpatient Encounter Medications as of 01/15/2021  Medication Sig   albuterol (VENTOLIN HFA) 108 (90 Base) MCG/ACT inhaler Inhale 2 puffs into the lungs every 6 (six) hours as needed for wheezing or shortness of breath.   aspirin 81 MG tablet Take 81 mg by mouth at bedtime.    atorvastatin (LIPITOR) 20 MG tablet TAKE 1 TABLET EVERY DAY   buPROPion (WELLBUTRIN XL) 300 MG 24 hr tablet TAKE 1 TABLET EVERY DAY   carvedilol (COREG) 6.25 MG tablet Take 1 tablet (6.25 mg total) by mouth 2 (two) times daily.   clonazePAM (KLONOPIN) 0.5 MG tablet Take 1 tablet (0.5 mg total) by mouth 2 (two) times daily as needed for anxiety (panic attacks).   Coenzyme Q10 (CO Q 10) 100 MG CAPS Take 200 mg by mouth daily.   diphenhydrAMINE (BENADRYL) 25 MG tablet Take 50 mg by mouth at bedtime as needed.   Dulaglutide (TRULICITY) 3 MG/0.5ML SOPN Inject 3 mg as directed once a week.   FARXIGA 10 MG TABS tablet Take 1 tablet (10 mg total) by mouth daily.   gabapentin (NEURONTIN) 400 MG capsule TAKE 2 CAPSULES IN THE MORNING AND TAKE 2 CAPSULES IN THE EVENING   levothyroxine (SYNTHROID) 75 MCG tablet TAKE 1 TABLET (75 MCG TOTAL) BY MOUTH DAILY.   Lutein 20 MG TABS Take 1 tablet by mouth daily.   meloxicam (MOBIC) 15 MG tablet TAKE 1 TABLET (15 MG TOTAL) BY MOUTH DAILY.   Multiple Vitamin (MULTIVITAMIN WITH MINERALS) TABS tablet Take 1 tablet by mouth daily.   OXYGEN 2lpm 2 lpm as needed   sacubitril-valsartan (ENTRESTO) 24-26 MG Take 1 tablet by mouth 2 (two) times daily.   Tiotropium Bromide-Olodaterol (STIOLTO RESPIMAT) 2.5-2.5 MCG/ACT AERS INHALE 2 PUFFS INTO THE LUNGS DAILY.   tiZANidine (ZANAFLEX) 4 MG tablet TAKE 1 TABLET EVERY 6 HOURS AS NEEDED FOR MUSCLE SPASM(S)   traMADol (ULTRAM) 50 MG tablet Take 1 tablet (50 mg total) by mouth 2 (two) times daily.    UNABLE TO FIND CPAP with o2 2lpm  DME- [facility]   VASCEPA 1 g capsule TAKE 2 CAPSULES BY MOUTH 2 TIMES DAILY.   Vitamin D, Ergocalciferol, (DRISDOL) 1.25 MG (50000 UNIT) CAPS capsule TAKE 1 CAPSULE EVERY 7 DAYS   No facility-administered encounter medications on file as of 01/15/2021.   01-15-2021: Completed 2023 application for Stiolto. Patient aware application will be mailed to sign and return to office.  Huey Romans [facility] Clinical Pharmacist Assistant [phone]

## 2021-01-16 LAB — LIPID PANEL
Chol/HDL Ratio: 3.7 ratio (ref 0.0–4.4)
Cholesterol, Total: 142 mg/dL (ref 100–199)
HDL: 38 mg/dL — ABNORMAL LOW (ref >39)
LDL Chol Calc (NIH): 69 mg/dL (ref 0–99)
Triglycerides: 214 mg/dL — ABNORMAL HIGH (ref 0–149)
VLDL Cholesterol Cal: 35 mg/dL (ref 5–40)

## 2021-01-16 LAB — CBC WITH DIFFERENTIAL/PLATELET
Basophils Absolute: 0.1 10*3/uL (ref 0.0–0.2)
Basos: 1 %
EOS (ABSOLUTE): 0.4 10*3/uL (ref 0.0–0.4)
Eos: 6 %
Hematocrit: 43.9 % (ref 34.0–46.6)
Hemoglobin: 14.5 g/dL (ref 11.1–15.9)
Immature Grans (Abs): 0 10*3/uL (ref 0.0–0.1)
Immature Granulocytes: 0 %
Lymphocytes Absolute: 1.7 10*3/uL (ref 0.7–3.1)
Lymphs: 26 %
MCH: 31 pg (ref 26.6–33.0)
MCHC: 33 g/dL (ref 31.5–35.7)
MCV: 94 fL (ref 79–97)
Monocytes Absolute: 0.6 10*3/uL (ref 0.1–0.9)
Monocytes: 9 %
Neutrophils Absolute: 3.8 10*3/uL (ref 1.4–7.0)
Neutrophils: 58 %
Platelets: 244 10*3/uL (ref 150–450)
RBC: 4.68 x10E6/uL (ref 3.77–5.28)
RDW: 12.8 % (ref 11.7–15.4)
WBC: 6.4 10*3/uL (ref 3.4–10.8)

## 2021-01-16 LAB — COMPREHENSIVE METABOLIC PANEL
ALT: 23 IU/L (ref 0–32)
AST: 20 IU/L (ref 0–40)
Albumin/Globulin Ratio: 2 (ref 1.2–2.2)
Albumin: 4.3 g/dL (ref 3.8–4.9)
Alkaline Phosphatase: 172 IU/L — ABNORMAL HIGH (ref 44–121)
BUN/Creatinine Ratio: 18 (ref 12–28)
BUN: 14 mg/dL (ref 8–27)
Bilirubin Total: 1 mg/dL (ref 0.0–1.2)
CO2: 25 mmol/L (ref 20–29)
Calcium: 9.8 mg/dL (ref 8.7–10.3)
Chloride: 104 mmol/L (ref 96–106)
Creatinine, Ser: 0.8 mg/dL (ref 0.57–1.00)
Globulin, Total: 2.2 g/dL (ref 1.5–4.5)
Glucose: 110 mg/dL — ABNORMAL HIGH (ref 70–99)
Potassium: 4.8 mmol/L (ref 3.5–5.2)
Sodium: 143 mmol/L (ref 134–144)
Total Protein: 6.5 g/dL (ref 6.0–8.5)
eGFR: 84 mL/min/{1.73_m2} (ref >59)

## 2021-01-16 LAB — CARDIOVASCULAR RISK ASSESSMENT

## 2021-01-16 LAB — HEMOGLOBIN A1C
Est. average glucose Bld gHb Est-mCnc: 123 mg/dL
Hgb A1c MFr Bld: 5.9 % — ABNORMAL HIGH (ref 4.8–5.6)

## 2021-01-17 ENCOUNTER — Encounter: Payer: Self-pay | Admitting: Family Medicine

## 2021-01-17 NOTE — Progress Notes (Signed)
Blood count normal.  Liver function normal.  Kidney function normal.  Cholesterol: trigs too high. LDL great.  HBA1C: Great!

## 2021-01-18 ENCOUNTER — Other Ambulatory Visit: Payer: Self-pay

## 2021-01-18 DIAGNOSIS — G894 Chronic pain syndrome: Secondary | ICD-10-CM

## 2021-01-19 ENCOUNTER — Ambulatory Visit (INDEPENDENT_AMBULATORY_CARE_PROVIDER_SITE_OTHER): Payer: Medicare HMO | Admitting: Family Medicine

## 2021-01-19 ENCOUNTER — Other Ambulatory Visit: Payer: Self-pay

## 2021-01-19 VITALS — BP 120/70 | HR 88 | Temp 97.4°F | Resp 20 | Ht 65.0 in | Wt 275.0 lb

## 2021-01-19 DIAGNOSIS — E1169 Type 2 diabetes mellitus with other specified complication: Secondary | ICD-10-CM | POA: Diagnosis not present

## 2021-01-19 DIAGNOSIS — E1159 Type 2 diabetes mellitus with other circulatory complications: Secondary | ICD-10-CM

## 2021-01-19 DIAGNOSIS — I10 Essential (primary) hypertension: Secondary | ICD-10-CM

## 2021-01-19 DIAGNOSIS — J9611 Chronic respiratory failure with hypoxia: Secondary | ICD-10-CM | POA: Diagnosis not present

## 2021-01-19 DIAGNOSIS — I110 Hypertensive heart disease with heart failure: Secondary | ICD-10-CM | POA: Diagnosis not present

## 2021-01-19 DIAGNOSIS — M5412 Radiculopathy, cervical region: Secondary | ICD-10-CM

## 2021-01-19 DIAGNOSIS — E785 Hyperlipidemia, unspecified: Secondary | ICD-10-CM

## 2021-01-19 DIAGNOSIS — E038 Other specified hypothyroidism: Secondary | ICD-10-CM | POA: Diagnosis not present

## 2021-01-19 DIAGNOSIS — E782 Mixed hyperlipidemia: Secondary | ICD-10-CM | POA: Diagnosis not present

## 2021-01-19 DIAGNOSIS — G4733 Obstructive sleep apnea (adult) (pediatric): Secondary | ICD-10-CM | POA: Diagnosis not present

## 2021-01-19 DIAGNOSIS — I152 Hypertension secondary to endocrine disorders: Secondary | ICD-10-CM

## 2021-01-19 DIAGNOSIS — M4030 Flatback syndrome, site unspecified: Secondary | ICD-10-CM

## 2021-01-19 DIAGNOSIS — I5042 Chronic combined systolic (congestive) and diastolic (congestive) heart failure: Secondary | ICD-10-CM

## 2021-01-19 NOTE — Progress Notes (Signed)
Subjective:  Patient ID: Judy [name], female    DOB: 02/18/1960  Age: 60 y.o. MRN: [id number]  Chief Complaint  Patient presents with   Diabetes   Hyperlipidemia   Hypertension    HPI Hypertensive CHFsecondary to NICM: Patient takes aspirin 81 mg daily, carvedilol 6.25 mg BID, Entresto 24-26 mg 1 tablet BID. Pt sees Dr. Armanda Magic. NYHA class 2B. EF has significantly improved with entresto from 35% in 08/2014 to 55% in 06/2017. LHC done in 2016 was normal.  OSA: uses CPAP religiously and can tell a significant difference in her sleep. She does use oxygen at night also.   COPD/Chronic respiratory failure with hypoxia: Pt sees Dr. Sherene Sires. Currently on stiolto. ON nocturnal oxygen 2 L. Does not require during the day, but has DOE at baseline.   Diabetes: She is currently taking Farxiga 10 mg daily, Trulicity Inject 3 mg into skin weekly. She had not been checking her sugars, but recently applied a sample of a cgm (free style libre2) and her sugars have been 88-103.  She has had 4 low sugars. . She is eating healthy. She checks feet daily and has an annual eye exam. Difficulty exercising due to copd.  Hyperlipidemia: Takes atorvastatin 20 mg 1 tablet daily, Vascepa 2 g BID, Coq10.  Hypothyroidism: Levothyroxine 75 mcg daily.  Chronic back pain and neck pain with radicular symptoms: Pt had severe scoliosis and had harrington rods placed as a young adult. She has had an increase in neck pain and radicular sxs in her rt arm. MRI of her neck done in 12/2020 showed evidence of cervical stenosis as well as foraminal stenosis. She has been working with PT on neural flossing (Noeli used to [employer], so has a lot of history around PT and her friend, who ia very experienced PT came and reviewed some exercises for her to do.) She has been doing these numerous times per day, and it is not improving. She is on  Gabapentin 800 mg BID. She has tramadol written tid prn, but she tries very hard  to not use this. She usually takes twice a day, but she is in constant pain between her neck and back. She also has tizanidine 4 mg qid prn.   GAD: She is taking Wellbutrin 300 mg daily, clonazepam 0.5mg  BID PRN. Denies depression.  Morbid obesity: pt had lost a large amount of weight around 2006. Her BMI was at goal. She then developed the above issues with NICM and COPD and gained the weight back. She has been unable to get the weight off again.  Current Outpatient Medications on File Prior to Visit  Medication Sig Dispense Refill   albuterol (VENTOLIN HFA) 108 (90 Base) MCG/ACT inhaler Inhale 2 puffs into the lungs every 6 (six) hours as needed for wheezing or shortness of breath. 8 g 2   aspirin 81 MG tablet Take 81 mg by mouth at bedtime.      buPROPion (WELLBUTRIN XL) 300 MG 24 hr tablet TAKE 1 TABLET EVERY DAY 90 tablet 3   carvedilol (COREG) 6.25 MG tablet Take 1 tablet (6.25 mg total) by mouth 2 (two) times daily. 180 tablet 3   clonazePAM (KLONOPIN) 0.5 MG tablet Take 1 tablet (0.5 mg total) by mouth 2 (two) times daily as needed for anxiety (panic attacks). 180 tablet 0   Coenzyme Q10 (CO Q 10) 100 MG CAPS Take 200 mg by mouth daily.     diphenhydrAMINE (BENADRYL) 25 MG tablet Take  50 mg by mouth at bedtime as needed.     Dulaglutide (TRULICITY) 3 MG/0.5ML SOPN Inject 3 mg as directed once a week. 2 mL 0   FARXIGA 10 MG TABS tablet Take 1 tablet (10 mg total) by mouth daily. 90 tablet 3   levothyroxine (SYNTHROID) 75 MCG tablet TAKE 1 TABLET (75 MCG TOTAL) BY MOUTH DAILY. 90 tablet 1   Lutein 20 MG TABS Take 1 tablet by mouth daily.     meloxicam (MOBIC) 15 MG tablet TAKE 1 TABLET (15 MG TOTAL) BY MOUTH DAILY. 90 tablet 3   Multiple Vitamin (MULTIVITAMIN WITH MINERALS) TABS tablet Take 1 tablet by mouth daily.     OXYGEN 2lpm 2 lpm as needed     sacubitril-valsartan (ENTRESTO) 24-26 MG Take 1 tablet by mouth 2 (two) times daily. 180 tablet 3   Tiotropium Bromide-Olodaterol (STIOLTO  RESPIMAT) 2.5-2.5 MCG/ACT AERS INHALE 2 PUFFS INTO THE LUNGS DAILY. 12 g 5   tiZANidine (ZANAFLEX) 4 MG tablet TAKE 1 TABLET EVERY 6 HOURS AS NEEDED FOR MUSCLE SPASM(S) 360 tablet 1   traMADol (ULTRAM) 50 MG tablet Take 1 tablet (50 mg total) by mouth every 6 (six) hours as needed for severe pain. (Patient taking differently: Take 50 mg by mouth. Take one four times a day as needed for pain) 120 tablet 5   UNABLE TO FIND CPAP with o2 2lpm  DME- [facility]     VASCEPA 1 g capsule TAKE 2 CAPSULES BY MOUTH 2 TIMES DAILY. 360 capsule 1   Vitamin D, Ergocalciferol, (DRISDOL) 1.25 MG (50000 UNIT) CAPS capsule TAKE 1 CAPSULE EVERY 7 DAYS 12 capsule 3   No current facility-administered medications on file prior to visit.   Past Medical History:  Diagnosis Date   Anxiety    Back pain    Bilateral swelling of feet    Chronic combined systolic and diastolic CHF (congestive heart failure) (HCC) 10/07/2014   COPD (chronic obstructive pulmonary disease) (HCC)    DCM (dilated cardiomyopathy) (HCC) 04/25/2014   EF 28% by MRI despite maximum medical therapy   Depression    Diabetes mellitus without complication (HCC)    Epilepsy (HCC)    as a child   Fatty liver    Former tobacco use    Hyperlipidemia    Hypertension    Hypothyroidism    Joint pain    Leg swelling    Morbid obesity (HCC)    NICM (nonischemic cardiomyopathy) (HCC)    OSA (obstructive sleep apnea)    moderate with AHI 26/hr with oxygen desaturations as low as 70%   Scoliosis    Sleep apnea    SOB (shortness of breath)    Past Surgical History:  Procedure Laterality Date   ABDOMINAL HYSTERECTOMY     ankle artery involved cyst removal     CARDIAC CATHETERIZATION  05/14/2014   normal coronary arteries   CARPAL TUNNEL RELEASE     FASCIOTOMY     1993 for platar fasciitis   harrington rod scoliosis     1981   LEFT HEART CATHETERIZATION WITH CORONARY ANGIOGRAM N/A 05/14/2014   Procedure: LEFT HEART CATHETERIZATION WITH CORONARY  ANGIOGRAM;  Surgeon: Iran Ouch, MD;  Location: [facility] CATH [facility];  Service: Cardiovascular;  Laterality: N/A;   ROTATOR CUFF REPAIR     UMBILICAL HERNIA REPAIR     VESICOVAGINAL FISTULA CLOSURE W/ TAH      Family History  Problem Relation Age of Onset   Emphysema Father  Allergies Father    Heart failure Father    Heart disease Father 46       MI   Diabetes Father    Hyperlipidemia Father    Hypertension Father    Thyroid disease Father    Alcohol abuse Father    Obesity Father    Diabetes Mother    Hyperlipidemia Mother    Obesity Mother    Social History   Socioeconomic History   Marital status: Married    Spouse name: Not on file   Number of children: Not on file   Years of education: Not on file   Highest education level: Not on file  Occupational History   Occupation: disabled  Tobacco Use   Smoking status: Former    Packs/day: 1.00    Years: 15.00    Pack years: 15.00    Types: Cigarettes    Quit date: 02/14/2005    Years since quitting: 15.9   Smokeless tobacco: Never  Vaping Use   Vaping Use: Never used  Substance and Sexual Activity   Alcohol use: Yes    Comment: occ   Drug use: No   Sexual activity: Not on file  Other Topics Concern   Not on file  Social History Narrative   Lives in [place] with spouse and son.   Previously worked as a Comptroller         Social Determinants of Corporate investment banker Strain: High Risk   Difficulty of Paying Living Expenses: Hard  Food Insecurity: No Food Insecurity   Worried About Programme researcher, broadcasting/film/video in the Last Year: Never true   Barista in the Last Year: Never true  Transportation Needs: No Transportation Needs   Lack of Transportation (Medical): No   Lack of Transportation (Non-Medical): No  Physical Activity: Inactive   Days of Exercise per Week: 0 days   Minutes of Exercise per Session: 0 min  Stress: Not on file  Social Connections: Not on file    Review of  Systems  Constitutional:  Negative for chills, fatigue and fever.  HENT:  Negative for congestion, rhinorrhea and sore throat.   Respiratory:  Positive for shortness of breath. Negative for cough.   Cardiovascular:  Negative for chest pain.  Gastrointestinal:  Positive for diarrhea. Negative for abdominal pain, constipation, nausea and vomiting.  Genitourinary:  Negative for dysuria and urgency.  Musculoskeletal:  Positive for arthralgias, back pain and myalgias.  Neurological:  Positive for weakness. Negative for dizziness, light-headedness and headaches.  Psychiatric/Behavioral:  Negative for dysphoric mood. The patient is not nervous/anxious.     Objective:  BP 120/70   Pulse 88   Temp (!) 97.4 F (36.3 C)   Resp 20   Ht  (1.651 m)   Wt 275 lb (124.7 kg)   BMI 45.76 kg/m   BP/Weight 01/26/2021 01/19/2021 11/10/2020  Systolic BP 118 120 124  Diastolic BP 72 70 70  Wt. (Lbs) 279 275 276  BMI 46.43 45.76 45.93    Physical Exam Vitals reviewed.  Constitutional:      Appearance: Normal appearance. She is obese.  Neck:     Vascular: No carotid bruit.  Cardiovascular:     Rate and Rhythm: Normal rate and regular rhythm.     Pulses: Normal pulses.     Heart sounds: Normal heart sounds.  Pulmonary:     Effort: Pulmonary effort is normal.     Breath sounds: Normal breath  sounds.  Abdominal:     General: Bowel sounds are normal.     Palpations: Abdomen is soft.     Tenderness: There is no abdominal tenderness.  Musculoskeletal:        General: Tenderness (lumbar and neck) present.     Comments: Walks with constant flexion at waist due to lumbar back pain and hardware  Neurological:     Mental Status: She is alert and oriented to person, place, and time.  Psychiatric:        Mood and Affect: Mood normal.        Behavior: Behavior normal.    Diabetic Foot Exam - Simple   Simple Foot Form  01/19/2021  1:53 PM  Visual Inspection No deformities, no ulcerations, no  other skin breakdown bilaterally: Yes Sensation Testing Intact to touch and monofilament testing bilaterally: Yes Pulse Check Posterior Tibialis and Dorsalis pulse intact bilaterally: Yes Comments      Lab Results  Component Value Date   WBC 6.4 01/15/2021   HGB 14.5 01/15/2021   HCT 43.9 01/15/2021   PLT 244 01/15/2021   GLUCOSE 110 (H) 01/15/2021   CHOL 142 01/15/2021   TRIG 214 (H) 01/15/2021   HDL 38 (L) 01/15/2021   LDLCALC 69 01/15/2021   ALT 23 01/15/2021   AST 20 01/15/2021   NA 143 01/15/2021   K 4.8 01/15/2021   CL 104 01/15/2021   CREATININE 0.80 01/15/2021   BUN 14 01/15/2021   CO2 25 01/15/2021   TSH 3.070 04/06/2020   INR 1.0 05/13/2014   HGBA1C 5.9 (H) 01/15/2021   MICROALBUR Negative 09/03/2019      Assessment & Plan:   Problem List Items Addressed This Visit       Cardiovascular and Mediastinum   Hypertensive heart disease with CHF (congestive heart failure) (HCC)    The current medical regimen is effective;  continue present plan and medications. Management per specialist. Carolanne Grumbling, MD       Relevant Medications   atorvastatin (LIPITOR) 20 MG tablet   Hypertension associated with diabetes (HCC)    Well controlled.  No medication changes recommended. Continue healthy diet and exercise.       Relevant Medications   atorvastatin (LIPITOR) 20 MG tablet     Respiratory   OSA (obstructive sleep apnea)    Continue cpap with nocturnal oxygen.  Follow up with Dr. Sherene Sires.      Chronic respiratory failure with hypoxia (HCC)    The current regimen is effective; continue present plan and medications. Patient is in oxygen 2 liters.        Endocrine   Other specified hypothyroidism    The current medical regimen is effective;  continue present plan and medications.        Nervous and Auditory   Cervical radiculopathy - Primary    Increase gabapentin 400 mg 2 pills three times a day.  Increase tramadol 50 mg once four times a day as  needed.  Start meloxicam 15 mg daily.  Refer to [place] [facility]      Relevant Orders   Ambulatory referral to Neurosurgery     Musculoskeletal and Integument   Flatback syndrome    Abnormal gait.          Other   Mixed hyperlipidemia    Trigs not at goal. LDL good Continue vascepa 1 gm 2 capsules twice a day  Decrease lipitor 20 mg 1/2 daily. Start fenofibrate 160 mg once daily  Relevant Medications   atorvastatin (LIPITOR) 20 MG tablet   Morbid obesity due to excess calories (HCC)    Recommend continue to work on eating healthy diet and exercise.      .  Meds ordered this encounter  Medications   atorvastatin (LIPITOR) 20 MG tablet    Sig: Take 0.5 tablets (10 mg total) by mouth daily. Take half tablet daily (10 mg total daily)    Dispense:  1 tablet    Refill:  0    Marla [employer], acting as a Neurosurgeon for Blane Ohara, MD.,has helped documente all relevant documentation on the behalf of Blane Ohara, MD,as directed by  Blane Ohara, MD while in the presence of Blane Ohara, MD.   Orders Placed This Encounter  Procedures   Ambulatory referral to Neurosurgery      Follow-up: Return in about 3 months (around 04/19/2021) for chronic fasting.  An After Visit Summary was printed and given to the patient.  Blane Ohara, MD [facility] [phone]

## 2021-01-19 NOTE — Patient Instructions (Signed)
Recommend  Continue vascepa 1 gm 2 capsules twice a day  Decrease lipitor 20 mg 1/2 daily. Start fenofibrate 160 mg once daily  Increase gabapentin 400 mg 2 pills three times a day.   Increase tramadol 50 mg once four times a day as needed.   Refer to [facility].

## 2021-01-20 ENCOUNTER — Ambulatory Visit: Payer: Medicare HMO

## 2021-01-20 ENCOUNTER — Ambulatory Visit: Payer: Medicare HMO | Admitting: Cardiology

## 2021-01-20 MED ORDER — TRAMADOL HCL 50 MG PO TABS: 50.0000 mg | ORAL_TABLET | Freq: Four times a day (QID) | ORAL | 5 refills | Status: DC | PRN

## 2021-01-20 NOTE — Telephone Encounter (Signed)
[employer] calling for update on script. Asking for script be sent.   Lorita Officer, CCMA 01/20/21 2:26 PM

## 2021-01-22 DIAGNOSIS — H903 Sensorineural hearing loss, bilateral: Secondary | ICD-10-CM | POA: Diagnosis not present

## 2021-01-24 NOTE — Assessment & Plan Note (Signed)
The current medical regimen is effective;  continue present plan and medications.  

## 2021-01-24 NOTE — Assessment & Plan Note (Addendum)
The current regimen is effective; continue present plan and medications. Patient is in oxygen 2 liters.

## 2021-01-24 NOTE — Assessment & Plan Note (Addendum)
Trigs not at goal. LDL good Continue vascepa 1 gm 2 capsules twice a day  Decrease lipitor 20 mg 1/2 daily. Start fenofibrate 160 mg once daily

## 2021-01-24 NOTE — Assessment & Plan Note (Signed)
Well controlled.  No medication changes recommended. Continue healthy diet and exercise.  

## 2021-01-26 ENCOUNTER — Ambulatory Visit (INDEPENDENT_AMBULATORY_CARE_PROVIDER_SITE_OTHER): Payer: Medicare HMO

## 2021-01-26 ENCOUNTER — Other Ambulatory Visit: Payer: Self-pay

## 2021-01-26 VITALS — BP 118/72 | HR 73 | Resp 20 | Ht 65.0 in | Wt 279.0 lb

## 2021-01-26 DIAGNOSIS — Z0000 Encounter for general adult medical examination without abnormal findings: Secondary | ICD-10-CM | POA: Diagnosis not present

## 2021-01-27 NOTE — Progress Notes (Signed)
Subjective:   Judy [name] is a 60 y.o. female who presents for Medicare Annual (Subsequent) preventive examination.  This wellness visit is conducted by a nurse.  The patient's medications were reviewed and reconciled since the patient's last visit.  History details were provided by the patient.  The history appears to be reliable.    Patient's last AWV was more than one year ago.   Medical History: Patient history and Family history was reviewed  Medications, Allergies, and preventative health maintenance was reviewed and updated.   Review of Systems    Review of Systems  Constitutional: Negative.   HENT: Negative.    Eyes: Negative.   Respiratory:  Positive for shortness of breath.   Cardiovascular: Negative.  Negative for chest pain and palpitations.  Musculoskeletal:  Positive for arthralgias and back pain.   Cardiac Risk Factors include: diabetes mellitus;dyslipidemia;obesity (BMI >30kg/m2)     Objective:    Today's Vitals   01/26/21 1100 01/26/21 1440  BP: 118/72   Pulse: 73   Resp: 20   SpO2: 95%   Weight: 279 lb (126.6 kg)   Height:  (1.651 m)   PainSc: 6  6    Body mass index is 46.43 kg/m.  Advanced Directives 01/26/2021 09/24/2018 01/29/2015 11/17/2014 05/14/2014 04/25/2014  Does Patient Have a Medical Advance Directive? Yes No No No No No  Type of Estate agent of [name];Living will - - - - -  Does patient want to make changes to medical advance directive? No - Patient declined - - - - -  Copy of Healthcare Power of Attorney in Chart? No - copy requested - - - - -  Would patient like information on creating a medical advance directive? - No - Patient declined No - patient declined information Yes - Educational materials given No - patient declined information -    Current Medications (verified) Outpatient Encounter Medications as of 01/26/2021  Medication Sig   albuterol (VENTOLIN HFA) 108 (90 Base) MCG/ACT inhaler Inhale 2  puffs into the lungs every 6 (six) hours as needed for wheezing or shortness of breath.   aspirin 81 MG tablet Take 81 mg by mouth at bedtime.    atorvastatin (LIPITOR) 20 MG tablet TAKE 1 TABLET EVERY DAY (Patient taking differently: Take 20 mg by mouth daily. Take half tablet daily (10 mg total daily))   buPROPion (WELLBUTRIN XL) 300 MG 24 hr tablet TAKE 1 TABLET EVERY DAY   carvedilol (COREG) 6.25 MG tablet Take 1 tablet (6.25 mg total) by mouth 2 (two) times daily.   clonazePAM (KLONOPIN) 0.5 MG tablet Take 1 tablet (0.5 mg total) by mouth 2 (two) times daily as needed for anxiety (panic attacks).   Coenzyme Q10 (CO Q 10) 100 MG CAPS Take 200 mg by mouth daily.   diphenhydrAMINE (BENADRYL) 25 MG tablet Take 50 mg by mouth at bedtime as needed.   Dulaglutide (TRULICITY) 3 MG/0.5ML SOPN Inject 3 mg as directed once a week.   FARXIGA 10 MG TABS tablet Take 1 tablet (10 mg total) by mouth daily.   fenofibrate 160 MG tablet Take 160 mg by mouth daily.   gabapentin (NEURONTIN) 400 MG capsule TAKE 2 CAPSULES IN THE MORNING AND TAKE 2 CAPSULES IN THE EVENING (Patient taking differently: Take 400 mg by mouth 3 (three) times daily. Take two capsules three times daily)YURITZY ZEHRINGine (SYNTHROID) 75 MCG tablet TAKE 1 TABLET (75 MCG TOTAL) BY MOUTH DAILY.   Lutein 20  MG TABS Take 1 tablet by mouth daily.   meloxicam (MOBIC) 15 MG tablet TAKE 1 TABLET (15 MG TOTAL) BY MOUTH DAILY.   Multiple Vitamin (MULTIVITAMIN WITH MINERALS) TABS tablet Take 1 tablet by mouth daily.   OXYGEN 2lpm 2 lpm as needed   sacubitril-valsartan (ENTRESTO) 24-26 MG Take 1 tablet by mouth 2 (two) times daily.   Tiotropium Bromide-Olodaterol (STIOLTO RESPIMAT) 2.5-2.5 MCG/ACT AERS INHALE 2 PUFFS INTO THE LUNGS DAILY.   tiZANidine (ZANAFLEX) 4 MG tablet TAKE 1 TABLET EVERY 6 HOURS AS NEEDED FOR MUSCLE SPASM(S)   traMADol (ULTRAM) 50 MG tablet Take 1 tablet (50 mg total) by mouth every 6 (six) hours as needed for severe pain.  (Patient taking differently: Take 50 mg by mouth. Take one four times a day as needed for pain)   UNABLE TO FIND CPAP with o2 2lpm  DME- [facility]   VASCEPA 1 g capsule TAKE 2 CAPSULES BY MOUTH 2 TIMES DAILY.   Vitamin D, Ergocalciferol, (DRISDOL) 1.25 MG (50000 UNIT) CAPS capsule TAKE 1 CAPSULE EVERY 7 DAYS   [DISCONTINUED] azithromycin (ZITHROMAX) 500 MG tablet  (Patient not taking: Reported on 01/26/2021)   No facility-administered encounter medications on file as of 01/26/2021.    Allergies (verified) Demerol [meperidine]   History: Past Medical History:  Diagnosis Date   Anxiety    Back pain    Bilateral swelling of feet    Chronic combined systolic and diastolic CHF (congestive heart failure) (HCC) 10/07/2014   COPD (chronic obstructive pulmonary disease) (HCC)    DCM (dilated cardiomyopathy) (HCC) 04/25/2014   EF 28% by MRI despite maximum medical therapy   Depression    Diabetes mellitus without complication (HCC)    Epilepsy (HCC)    as a child   Fatty liver    Former tobacco use    Hyperlipidemia    Hypertension    Hypothyroidism    Joint pain    Leg swelling    Morbid obesity (HCC)    NICM (nonischemic cardiomyopathy) (HCC)    OSA (obstructive sleep apnea)    moderate with AHI 26/hr with oxygen desaturations as low as 70%   Scoliosis    Sleep apnea    SOB (shortness of breath)    Past Surgical History:  Procedure Laterality Date   ABDOMINAL HYSTERECTOMY     ankle artery involved cyst removal     CARDIAC CATHETERIZATION  05/14/2014   normal coronary arteries   CARPAL TUNNEL RELEASE     FASCIOTOMY     1993 for platar fasciitis   harrington rod scoliosis     1981   LEFT HEART CATHETERIZATION WITH CORONARY ANGIOGRAM N/A 05/14/2014   Procedure: LEFT HEART CATHETERIZATION WITH CORONARY ANGIOGRAM;  Surgeon: Iran Ouch, MD;  Location: [facility];  Service: Cardiovascular;  Laterality: N/A;   ROTATOR CUFF REPAIR     UMBILICAL HERNIA REPAIR     VESICOVAGINAL  FISTULA CLOSURE W/ TAH     Family History  Problem Relation Age of Onset   Emphysema Father    Allergies Father    Heart failure Father    Heart disease Father 60       MI   Diabetes Father    Hyperlipidemia Father    Hypertension Father    Thyroid disease Father    Alcohol abuse Father    Obesity Father    Diabetes Mother    Hyperlipidemia Mother    Obesity Mother    Social History   Socioeconomic  History   Marital status: Married    Spouse name: Richard  Occupational History   Occupation: disabled  Tobacco Use   Smoking status: Former    Packs/day: 1.00    Years: 15.00    Pack years: 15.00    Types: Cigarettes    Quit date: 02/14/2005    Years since quitting: 15.9   Smokeless tobacco: Never  Vaping Use   Vaping Use: Never used  Substance and Sexual Activity   Alcohol use: Yes    Comment: occ   Drug use: No   Sexual activity: Not on file  Other Topics Concern   Not on file  Social History Narrative   Lives in [place] with spouse and son.   Previously worked as a Comptroller         Social Determinants of Corporate investment banker Strain: High Risk   Difficulty of Paying Living Expenses: Hard  Food Insecurity: No Food Insecurity   Worried About Programme researcher, broadcasting/film/video in the Last Year: Never true   Barista in the Last Year: Never true  Transportation Needs: No Transportation Needs   Lack of Transportation (Medical): No   Lack of Transportation (Non-Medical): No  Physical Activity: Inactive   Days of Exercise per Week: 0 days   Minutes of Exercise per Session: 0 min  Stress: Not on file  Social Connections: Not on file    Tobacco Counseling Counseling given: Patient does not currently use tobacco products   Clinical Intake:  Pre-visit preparation completed: Yes  Pain : 0-10 Pain Score: 6  Pain Type: Chronic pain Pain Relieving Factors: Tramadol, Gabapentin Effect of Pain on Daily Activities: Moderate Pain Relieving  Factors: Tramadol, Gabapentin BMI - recorded: 46.43 Nutritional Status: BMI > 30  Obese Nutritional Risks: None Diabetes: Yes CBG done?: No Did pt. bring in CBG monitor from home?: No How often do you need to have someone help you when you read instructions, pamphlets, or other written materials from your doctor or pharmacy?: 1 - Never Interpreter Needed?: No   Activities of Daily Living In your present state of health, do you have any difficulty performing the following activities: 01/26/2021 07/09/2020  Hearing? N N  Vision? N N  Difficulty concentrating or making decisions? N N  Walking or climbing stairs? Y N  Dressing or bathing? N N  Doing errands, shopping? Y N  Preparing Food and eating ? N -  Using the Toilet? N -  In the past six months, have you accidently leaked urine? N -  Do you have problems with loss of bowel control? N -  Managing your Medications? N -  Managing your Finances? N -  Housekeeping or managing your Housekeeping? Y -  Some recent data might be hidden    Patient Care Team: Blane Ohara, MD as PCP - General (Family Medicine) Quintella Reichert, MD as PCP - Cardiology (Cardiology)     Assessment:   This is a routine wellness examination for [place].  Hearing/Vision screen No results found.  Dietary issues and exercise activities discussed: Current Exercise Habits: The patient does not participate in regular exercise at present, Exercise limited by: respiratory conditions(s);orthopedic condition(s)  Depression Screen PHQ 2/9 Scores 01/26/2021 10/15/2020 07/09/2020 01/13/2020 12/18/2019 07/17/2019  PHQ - 2 Score 0 0 0 4 2 3   PHQ- 9 Score - 2 6 17  - 18    Fall Risk Fall Risk  01/26/2021 07/09/2020  Falls in the past year?  0 0  Number falls in past yr: 0 0  Injury with Fall? 0 0  Risk for fall due to : Impaired balance/gait Impaired balance/gait  Follow up Falls evaluation completed;Education provided Falls evaluation completed   ASSISTIVE DEVICES  UTILIZED TO PREVENT FALLS:  Life alert? No  Use of a cane, walker or w/c? Yes  Gait slow and steady with assistive device  Cognitive Function:     6CIT Screen 01/26/2021  What Year? 0 points  What month? 0 points  What time? 0 points  Count back from 20 0 points  Months in reverse 0 points  Repeat phrase 0 points  Total Score 0    Immunizations Immunization History  Administered Date(s) Administered   Influenza Inj Mdck Quad Pf 01/13/2020, 10/15/2020   Influenza Split 10/15/2013, 11/24/2014   Influenza-Unspecified 11/14/2012, 11/04/2018   PFIZER(Purple Top)SARS-COV-2 Vaccination 05/09/2019, 06/03/2019, 11/14/2019   Pfizer Covid-19 Vaccine Bivalent Booster 43yrs & up 11/10/2020   Pneumococcal Polysaccharide-23 12/05/2013, 11/28/2014   Pneumococcal-Unspecified 10/15/2013   Tdap 09/30/2015    TDAP status: Up to date  Flu Vaccine status: Up to date  Pneumococcal vaccine status: Up to date  Covid-19 vaccine status: Completed vaccines  Screening Tests Health Maintenance  Topic Date Due   HIV Screening  Never done   Hepatitis C Screening  Never done   Zoster Vaccines- Shingrix (1 of 2) Never done   Pneumococcal Vaccine 70-65 Years old (3 - PCV) 11/28/2015   MAMMOGRAM  09/16/2020   OPHTHALMOLOGY EXAM  02/23/2021   FOOT EXAM  07/09/2021   HEMOGLOBIN A1C  07/16/2021   COLONOSCOPY (Pts 45-59yrs Insurance coverage will need to be confirmed)  12/14/2021   TETANUS/TDAP  09/29/2025   INFLUENZA VACCINE  Completed   COVID-19 Vaccine  Completed   HPV VACCINES  Aged Out   PAP SMEAR-Modifier  Discontinued    Health Maintenance  Health Maintenance Due  Topic Date Due   HIV Screening  Never done   Hepatitis C Screening  Never done   Zoster Vaccines- Shingrix (1 of 2) Never done   Pneumococcal Vaccine 58-27 Years old (3 - PCV) 11/28/2015   MAMMOGRAM  09/16/2020    Colorectal cancer screening: Type of screening: Colonoscopy. Completed 2013. Repeat every 10  years  Mammogram status: Completed 09/2019. Patient has mammogram scheduled  Lung Cancer Screening: (Low Dose CT Chest recommended if Age 65-80 years, 30 pack-year currently smoking OR have quit w/in 15years.) does not qualify.    Additional Screening:  Vision Screening: Recommended annual ophthalmology exams for early detection of glaucoma and other disorders of the eye. Is the patient up to date with their annual eye exam?  Yes  Who is the provider or what is the name of the office in which the patient attends annual eye exams? [facility]  Dental Screening: Recommended annual dental exams for proper oral hygiene    Plan:    1- Colonoscopy will be due in upcoming year 2- Mammogram scheduled for March 2023 3- Diabetic Eye Exam is scheduled for January 2023 4- Follow up with Dr Sedalia Muta about medication changes and referral to [facility]  5- Bring in advance directive so we can add a copy to the chart 6- QHS Oxygen supplied from [employer]  I have personally reviewed and noted the following in the patients chart:   Medical and social history Use of alcohol, tobacco or illicit drugs  Current medications and supplements including opioid prescriptions.  Functional ability and status Nutritional status  Physical activity Advanced directives List of other physicians Hospitalizations, surgeries, and ER visits in previous 12 months Vitals Screenings to include cognitive, depression, and falls Referrals and appointments  In addition, I have reviewed and discussed with patient certain preventive protocols, quality metrics, and best practice recommendations. A written personalized care plan for preventive services as well as general preventive health recommendations were provided to patient.     Jacklynn Bue, LPN   [id number]

## 2021-01-27 NOTE — Patient Instructions (Signed)
Diabetes Mellitus and Foot Care °Foot care is an important part of your health, especially when you have diabetes. Diabetes may cause you to have problems because of poor blood flow (circulation) to your feet and legs, which can cause your skin to: °Become thinner and drier. °Break more easily. °Heal more slowly. °Peel and crack. °You may also have nerve damage (neuropathy) in your legs and feet, causing decreased feeling in them. This means that you may not notice minor injuries to your feet that could lead to more serious problems. Noticing and addressing any potential problems early is the best way to prevent future foot problems. °How to care for your feet °Foot hygiene ° °Wash your feet daily with warm water and mild soap. Do not use hot water. Then, pat your feet and the areas between your toes until they are completely dry. Do not soak your feet as this can dry your skin. °Trim your toenails straight across. Do not dig under them or around the cuticle. File the edges of your nails with an emery board or nail file. °Apply a moisturizing lotion or petroleum jelly to the skin on your feet and to dry, brittle toenails. Use lotion that does not contain alcohol and is unscented. Do not apply lotion between your toes. °Shoes and socks °Wear clean socks or stockings every day. Make sure they are not too tight. Do not wear knee-high stockings since they may decrease blood flow to your legs. °Wear shoes that fit properly and have enough cushioning. Always look in your shoes before you put them on to be sure there are no objects inside. °To break in new shoes, wear them for just a few hours a day. This prevents injuries on your feet. °Wounds, scrapes, corns, and calluses ° °Check your feet daily for blisters, cuts, bruises, sores, and redness. If you cannot see the bottom of your feet, use a mirror or ask someone for help. °Do not cut corns or calluses or try to remove them with medicine. °If you find a minor scrape,  cut, or break in the skin on your feet, keep it and the skin around it clean and dry. You may clean these areas with mild soap and water. Do not clean the area with peroxide, alcohol, or iodine. °If you have a wound, scrape, corn, or callus on your foot, look at it several times a day to make sure it is healing and not infected. Check for: °Redness, swelling, or pain. °Fluid or blood. °Warmth. °Pus or a bad smell. °General tips °Do not cross your legs. This may decrease blood flow to your feet. °Do not use heating pads or hot water bottles on your feet. They may burn your skin. If you have lost feeling in your feet or legs, you may not know this is happening until it is too late. °Protect your feet from hot and cold by wearing shoes, such as at the beach or on hot pavement. °Schedule a complete foot exam at least once a year (annually) or more often if you have foot problems. Report any cuts, sores, or bruises to your health care provider immediately. °Where to find more information °American Diabetes Association: www.diabetes.org °Association of Diabetes Care & Education Specialists: www.diabeteseducator.org °Contact a health care provider if: °You have a medical condition that increases your risk of infection and you have any cuts, sores, or bruises on your feet. °You have an injury that is not healing. °You have redness on your legs or feet. °You   feel burning or tingling in your legs or feet. °You have pain or cramps in your legs and feet. °Your legs or feet are numb. °Your feet always feel cold. °You have pain around any toenails. °Get help right away if: °You have a wound, scrape, corn, or callus on your foot and: °You have pain, swelling, or redness that gets worse. °You have fluid or blood coming from the wound, scrape, corn, or callus. °Your wound, scrape, corn, or callus feels warm to the touch. °You have pus or a bad smell coming from the wound, scrape, corn, or callus. °You have a fever. °You have a red  line going up your leg. °Summary °Check your feet every day for blisters, cuts, bruises, sores, and redness. °Apply a moisturizing lotion or petroleum jelly to the skin on your feet and to dry, brittle toenails. °Wear shoes that fit properly and have enough cushioning. °If you have foot problems, report any cuts, sores, or bruises to your health care provider immediately. °Schedule a complete foot exam at least once a year (annually) or more often if you have foot problems. °This information is not intended to replace advice given to you by your health care provider. Make sure you discuss any questions you have with your health care provider. °Document Revised: 08/22/2019 Document Reviewed: 08/22/2019 °Elsevier Patient Education © 2022 Elsevier Inc. ° °Health Maintenance, Female °Adopting a healthy lifestyle and getting preventive care are important in promoting health and wellness. Ask your health care provider about: °The right schedule for you to have regular tests and exams. °Things you can do on your own to prevent diseases and keep yourself healthy. °What should I know about diet, weight, and exercise? °Eat a healthy diet ° °Eat a diet that includes plenty of vegetables, fruits, low-fat dairy products, and lean protein. °Do not eat a lot of foods that are high in solid fats, added sugars, or sodium. °Maintain a healthy weight °Body mass index (BMI) is used to identify weight problems. It estimates body fat based on height and weight. Your health care provider can help determine your BMI and help you achieve or maintain a healthy weight. °Get regular exercise °Get regular exercise. This is one of the most important things you can do for your health. Most adults should: °Exercise for at least 150 minutes each week. The exercise should increase your heart rate and make you sweat (moderate-intensity exercise). °Do strengthening exercises at least twice a week. This is in addition to the moderate-intensity  exercise. °Spend less time sitting. Even light physical activity can be beneficial. °Watch cholesterol and blood lipids °Have your blood tested for lipids and cholesterol at 60 years of age, then have this test every 5 years. °Have your cholesterol levels checked more often if: °Your lipid or cholesterol levels are high. °You are older than 60 years of age. °You are at high risk for heart disease. °What should I know about cancer screening? °Depending on your health history and family history, you may need to have cancer screening at various ages. This may include screening for: °Breast cancer. °Cervical cancer. °Colorectal cancer. °Skin cancer. °Lung cancer. °What should I know about heart disease, diabetes, and high blood pressure? °Blood pressure and heart disease °High blood pressure causes heart disease and increases the risk of stroke. This is more likely to develop in people who have high blood pressure readings or are overweight. °Have your blood pressure checked: °Every 3-5 years if you are 18-39 years of age. °Every   year if you are 40 years old or older. °Diabetes °Have regular diabetes screenings. This checks your fasting blood sugar level. Have the screening done: °Once every three years after age 40 if you are at a normal weight and have a low risk for diabetes. °More often and at a younger age if you are overweight or have a high risk for diabetes. °What should I know about preventing infection? °Hepatitis B °If you have a higher risk for hepatitis B, you should be screened for this virus. Talk with your health care provider to find out if you are at risk for hepatitis B infection. °Hepatitis C °Testing is recommended for: °Everyone born from 1945 through 1965. °Anyone with known risk factors for hepatitis C. °Sexually transmitted infections (STIs) °Get screened for STIs, including gonorrhea and chlamydia, if: °You are sexually active and are younger than 60 years of age. °You are older than 60 years  of age and your health care provider tells you that you are at risk for this type of infection. °Your sexual activity has changed since you were last screened, and you are at increased risk for chlamydia or gonorrhea. Ask your health care provider if you are at risk. °Ask your health care provider about whether you are at high risk for HIV. Your health care provider may recommend a prescription medicine to help prevent HIV infection. If you choose to take medicine to prevent HIV, you should first get tested for HIV. You should then be tested every 3 months for as long as you are taking the medicine. °Pregnancy °If you are about to stop having your period (premenopausal) and you may become pregnant, seek counseling before you get pregnant. °Take 400 to 800 micrograms (mcg) of folic acid every day if you become pregnant. °Ask for birth control (contraception) if you want to prevent pregnancy. °Osteoporosis and menopause °Osteoporosis is a disease in which the bones lose minerals and strength with aging. This can result in bone fractures. If you are 65 years old or older, or if you are at risk for osteoporosis and fractures, ask your health care provider if you should: °Be screened for bone loss. °Take a calcium or vitamin D supplement to lower your risk of fractures. °Be given hormone replacement therapy (HRT) to treat symptoms of menopause. °Follow these instructions at home: °Alcohol use °Do not drink alcohol if: °Your health care provider tells you not to drink. °You are pregnant, may be pregnant, or are planning to become pregnant. °If you drink alcohol: °Limit how much you have to: °0-1 drink a day. °Know how much alcohol is in your drink. In the [place] one drink equals one 12 oz bottle of beer (355 mL), one 5 oz glass of wine (148 mL), or one 1½ oz glass of hard liquor (44 mL). °Lifestyle °Do not use any products that contain nicotine or tobacco. These products include cigarettes, chewing tobacco, and vaping  devices, such as e-cigarettes. If you need help quitting, ask your health care provider. °Do not use street drugs. °Do not share needles. °Ask your health care provider for help if you need support or information about quitting drugs. °General instructions °Schedule regular health, dental, and eye exams. °Stay current with your vaccines. °Tell your health care provider if: °You often feel depressed. °You have ever been abused or do not feel safe at home. °Summary °Adopting a healthy lifestyle and getting preventive care are important in promoting health and wellness. °Follow your health care provider's instructions about   healthy diet, exercising, and getting tested or screened for diseases. °Follow your health care provider's instructions on monitoring your cholesterol and blood pressure. °This information is not intended to replace advice given to you by your health care provider. Make sure you discuss any questions you have with your health care provider. °Document Revised: 06/22/2020 Document Reviewed: 06/22/2020 °Elsevier Patient Education © 2022 Elsevier Inc. ° °

## 2021-01-28 ENCOUNTER — Encounter: Payer: Self-pay | Admitting: Family Medicine

## 2021-01-28 DIAGNOSIS — M5412 Radiculopathy, cervical region: Secondary | ICD-10-CM | POA: Insufficient documentation

## 2021-01-28 MED ORDER — ATORVASTATIN CALCIUM 20 MG PO TABS: 10.0000 mg | ORAL_TABLET | Freq: Every day | ORAL | 0 refills | Status: DC

## 2021-01-28 NOTE — Assessment & Plan Note (Signed)
Recommend continue to work on eating healthy diet and exercise.  

## 2021-01-28 NOTE — Assessment & Plan Note (Addendum)
Increase gabapentin 400 mg 2 pills three times a day.  Increase tramadol 50 mg once four times a day as needed.  Start meloxicam 15 mg daily.  Refer to [facility]

## 2021-01-29 ENCOUNTER — Other Ambulatory Visit: Payer: Self-pay | Admitting: Family Medicine

## 2021-01-29 MED ORDER — GABAPENTIN 800 MG PO TABS: 800.0000 mg | ORAL_TABLET | Freq: Three times a day (TID) | ORAL | 1 refills | Status: DC

## 2021-01-30 ENCOUNTER — Encounter: Payer: Self-pay | Admitting: Family Medicine

## 2021-01-30 NOTE — Assessment & Plan Note (Signed)
The current medical regimen is effective;  continue present plan and medications. Management per specialist. Carolanne Grumbling, MD

## 2021-01-30 NOTE — Assessment & Plan Note (Signed)
Abnormal gait.

## 2021-01-30 NOTE — Assessment & Plan Note (Signed)
Continue cpap with nocturnal oxygen.  Follow up with Dr. Sherene Sires.

## 2021-02-02 DIAGNOSIS — H903 Sensorineural hearing loss, bilateral: Secondary | ICD-10-CM | POA: Diagnosis not present

## 2021-02-24 ENCOUNTER — Ambulatory Visit: Payer: Medicare HMO | Admitting: Dermatology

## 2021-02-26 ENCOUNTER — Encounter: Payer: Self-pay | Admitting: Cardiology

## 2021-02-26 ENCOUNTER — Other Ambulatory Visit: Payer: Self-pay

## 2021-02-26 ENCOUNTER — Telehealth (INDEPENDENT_AMBULATORY_CARE_PROVIDER_SITE_OTHER): Payer: Medicare HMO | Admitting: Cardiology

## 2021-02-26 VITALS — BP 116/74 | HR 78 | Ht 65.0 in | Wt 272.0 lb

## 2021-02-26 DIAGNOSIS — I110 Hypertensive heart disease with heart failure: Secondary | ICD-10-CM | POA: Diagnosis not present

## 2021-02-26 DIAGNOSIS — G4733 Obstructive sleep apnea (adult) (pediatric): Secondary | ICD-10-CM

## 2021-02-26 DIAGNOSIS — I420 Dilated cardiomyopathy: Secondary | ICD-10-CM

## 2021-02-26 DIAGNOSIS — I5032 Chronic diastolic (congestive) heart failure: Secondary | ICD-10-CM | POA: Diagnosis not present

## 2021-02-26 NOTE — Patient Instructions (Signed)

## 2021-02-26 NOTE — Progress Notes (Signed)
Virtual Visit via Video Note   This visit type was conducted due to national recommendations for restrictions regarding the COVID-19 Pandemic (e.g. social distancing) in an effort to limit this patient's exposure and mitigate transmission in our community.  Due to her co-morbid illnesses, this patient is at least at moderate risk for complications without adequate follow up.  This format is felt to be most appropriate for this patient at this time.  All issues noted in this document were discussed and addressed.  A limited physical exam was performed with this format.  Please refer to the patient's chart for her consent to telehealth for [facility].    Date:  02/26/2021   ID:  Judy [name], DOB 24-Feb-1960, MRN [id number] The patient was identified using 2 identifiers.  Patient Location: Home Provider Location: Home Office   PCP:  Cox, Elnita Maxwell, MD   [facility] Providers Cardiologist:  Fransico Him, MD     Evaluation Performed:  Follow-Up Visit  Chief Complaint:  CHF, DCM, HTN, OSA  History of Present Illness:    Judy [name] is a 61 y.o. female with a hx of  NICM, chronic combined CHF, OSA on PAP, COPD, former tobacco abuse, HTN, HLD, morbid obesity. Prior echo 12/2013 showed EF 45-50%. She had abnormal stress test 04/2014 resulting in cardiac cath 05/14/14 showing no significant CAD, EF 30%.    Subsequent echo 08/2014 (poor quality) with EF approximately 35%. She was treated with medical therapy and subsequent cMRI 09/2014 showed diffuse HK, EF 28%, no infiltrative disease.  AICD recommended but she had been hesitant to pursue. F/U echo was obtained 07/29/16 showed EF approximately 45-50%, mild LVH, grade 2 DD, mild MR, mildly reduced RV function, PASP 52mmHg.  Repeat echo 06/19/2017 showed normalization of LV function with EF 55%.  She is here today for followup and is doing well.  She has chronic shortness of breath related to COPD which she thinks is stable.  She also has  chronic lower extremity edema related to morbid obesity which is controlled on diuretics.  She denies any chest pain or pressure, PND, orthopnea, dizziness, palpitations or syncope. SHe is compliant with her meds and is tolerating meds with no SE.     She is doing well with her CPAP device and thinks that she has gotten used to it.  She tolerates the mask and feels the pressure is adequate.  Since going on CPAP she feels rested in the am and has no significant daytime sleepiness.  She denies any significant  nasal dryness or nasal congestion. She has problems with mouth dryness even with the chin strap.   She does not think that he snores.     The patient does not have symptoms concerning for COVID-19 infection (fever, chills, cough, or new shortness of breath).    Past Medical History:  Diagnosis Date   Anxiety    Back pain    Bilateral swelling of feet    Chronic combined systolic and diastolic CHF (congestive heart failure) ([place]) 10/07/2014   COPD (chronic obstructive pulmonary disease) ([facility])    DCM (dilated cardiomyopathy) ([facility]) 04/25/2014   EF 28% by MRI despite maximum medical therapy   Depression    Diabetes mellitus without complication ([place])    Epilepsy ([place])    as a child   Fatty liver    Former tobacco use    Hyperlipidemia    Hypertension    Hypothyroidism    Joint pain    Leg  swelling    Morbid obesity ([facility])    NICM (nonischemic cardiomyopathy) (H[facility])    OSA (obstructive sleep apnea)    moderate with AHI 26/hr with oxygen desaturations as low as 70%   Scoliosis    Sleep apnea    SOB (shortness of breath)    Past Surgical History:  Procedure Laterality Date   ABDOMINAL HYSTERECTOMY     ankle artery involved cyst removal     CARDIAC CATHETERIZATION  05/14/2014   normal coronary arteries   CARPAL TUNNEL RELEASE     FASCIOTOMY     1993 for platar fasciitis   harrington rod scoliosis     1981   LEFT HEART CATHETERIZATION WITH CORONARY ANGIOGRAM N/A 05/14/2014    Procedure: LEFT HEART CATHETERIZATION WITH CORONARY ANGIOGRAM;  Surgeon: Iran Ouch, MD;  Location: [facility] CATH LAB;  Service: Cardiovascular;  Laterality: N/A;   ROTATOR CUFF REPAIR     UMBILICAL HERNIA REPAIR     VESICOVAGINAL FISTULA CLOSURE W/ TAH       Current Meds  Medication Sig   albuterol (VENTOLIN HFA) 108 (90 Base) MCG/ACT inhaler Inhale 2 puffs into the lungs every 6 (six) hours as needed for wheezing or shortness of breath.   aspirin 81 MG tablet Take 81 mg by mouth at bedtime.    atorvastatin (LIPITOR) 20 MG tablet Take 0.5 tablets (10 mg total) by mouth daily. Take half tablet daily (10 mg total daily)   buPROPion (WELLBUTRIN XL) 300 MG 24 hr tablet TAKE 1 TABLET EVERY DAY   carvedilol (COREG) 6.25 MG tablet Take 1 tablet (6.25 mg total) by mouth 2 (two) times daily.   clonazePAM (KLONOPIN) 0.5 MG tablet Take 1 tablet (0.5 mg total) by mouth 2 (two) times daily as needed for anxiety (panic attacks).   Coenzyme Q10 (CO Q 10) 100 MG CAPS Take 200 mg by mouth daily.   diphenhydrAMINE (BENADRYL) 25 MG tablet Take 50 mg by mouth at bedtime as needed.   Dulaglutide (TRULICITY) 3 MG/0.5ML SOPN Inject 3 mg as directed once a week.   FARXIGA 10 MG TABS tablet Take 1 tablet (10 mg total) by mouth daily.   gabapentin (NEURONTIN) 800 MG tablet Take 1 tablet (800 mg total) by mouth 3 (three) times daily.   levothyroxine (SYNTHROID) 75 MCG tablet TAKE 1 TABLET (75 MCG TOTAL) BY MOUTH DAILY.   Lutein 20 MG TABS Take 1 tablet by mouth daily.   meloxicam (MOBIC) 15 MG tablet TAKE 1 TABLET (15 MG TOTAL) BY MOUTH DAILY.   Multiple Vitamin (MULTIVITAMIN WITH MINERALS) TABS tablet Take 1 tablet by mouth daily.   OXYGEN 2lpm 2 lpm as needed   sacubitril-valsartan (ENTRESTO) 24-26 MG Take 1 tablet by mouth 2 (two) times daily.   Tiotropium Bromide-Olodaterol (STIOLTO RESPIMAT) 2.5-2.5 MCG/ACT AERS INHALE 2 PUFFS INTO THE LUNGS DAILY.   tiZANidine (ZANAFLEX) 4 MG tablet TAKE 1 TABLET EVERY 6 HOURS  AS NEEDED FOR MUSCLE SPASM(S)   traMADol (ULTRAM) 50 MG tablet Take 1 tablet (50 mg total) by mouth every 6 (six) hours as needed for severe pain. (Patient taking differently: Take 50 mg by mouth. Take one four times a day as needed for pain)   UNABLE TO FIND CPAP with o2 2lpm  DME- [facility]   VASCEPA 1 g capsule TAKE 2 CAPSULES BY MOUTH 2 TIMES DAILY.   Vitamin D, Ergocalciferol, (DRISDOL) 1.25 MG (50000 UNIT) CAPS capsule TAKE 1 CAPSULE EVERY 7 DAYS     Allergies:  Demerol [meperidine]   Social History   Tobacco Use   Smoking status: Former    Packs/day: 1.00    Years: 15.00    Pack years: 15.00    Types: Cigarettes    Quit date: 02/14/2005    Years since quitting: 16.0   Smokeless tobacco: Never  Vaping Use   Vaping Use: Never used  Substance Use Topics   Alcohol use: Yes    Comment: occ   Drug use: No     Family Hx: The patient's family history includes Alcohol abuse in her father; Allergies in her father; Diabetes in her father and mother; Emphysema in her father; Heart disease (age of onset: 68) in her father; Heart failure in her father; Hyperlipidemia in her father and mother; Hypertension in her father; Obesity in her father and mother; Thyroid disease in her father.  ROS:   Please see the history of present illness.     All other systems reviewed and are negative.   Prior CV studies:   The following studies were reviewed today:  PAP compliance download  Labs/Other Tests and Data Reviewed:    EKG:  No ECG reviewed.  Recent Labs: 04/06/2020: TSH 3.070 07/22/2020: Pro B Natriuretic peptide (BNP) 13.0 01/15/2021: ALT 23; BUN 14; Creatinine, Ser 0.80; Hemoglobin 14.5; Platelets 244; Potassium 4.8; Sodium 143   Recent Lipid Panel Lab Results  Component Value Date/Time   CHOL 142 01/15/2021 09:48 AM   TRIG 214 (H) 01/15/2021 09:48 AM   HDL 38 (L) 01/15/2021 09:48 AM   CHOLHDL 3.7 01/15/2021 09:48 AM   CHOLHDL 3.6 07/12/2016 11:15 AM   LDLCALC 69 01/15/2021 09:48  AM    Wt Readings from Last 3 Encounters:  02/26/21 272 lb (123.4 kg)  01/26/21 279 lb (126.6 kg)  01/19/21 275 lb (124.7 kg)     Risk Assessment/Calculations:          Objective:    Vital Signs:  BP 116/74    Pulse 78    Ht 5\' 5"  (1.651 m)    Wt 272 lb (123.4 kg)    SpO2 96%    BMI 45.26 kg/m    VITAL SIGNS:  reviewed GEN:  no acute distress EYES:  sclerae anicteric, EOMI - Extraocular Movements Intact RESPIRATORY:  normal respiratory effort, symmetric expansion CARDIOVASCULAR:  no peripheral edema SKIN:  no rash, lesions or ulcers. MUSCULOSKELETAL:  no obvious deformities. NEURO:  alert and oriented x 3, no obvious focal deficit PSYCH:  normal affect  ASSESSMENT & PLAN:    1.  Chronic diastolic CHF -she is currently NYHA class 2b -she has chronic DOE which is multifactorial from COPD, CHF as well as morbid obesity -She thinks her lower extremity edema is stable and her weight is down  -She has chronic shortness of breath with COPD and is followed by pulmonary -She will continue prescription drug management with Farxiga 10 mg daily, carvedilol 6.25 mg twice daily, Entresto 24-26 mg twice daily and as needed Lasix with as needed refills -I have personally reviewed and interpreted outside labs performed by patient's PCP which showed serum creatinine 0.8 and potassium 4.8 on 01/15/2021   2.  Nonischemic DCM -essentially normal coronary arteries by cath 2016 -DCM resolved with EF 55% on echo 2018 -carvedilol was stopped due to hypotension but then restarted at a lower dose due to tachycardia -Continue prescription drug management with carvedilol, Delene Loll, Farxiga and as needed Lasix    3.  HTN -BP is well controlled on exam  today -Continue Carvedilol and Entresto   4. OSA - The patient is tolerating PAP therapy well without any problems. The PAP download performed by his DME was personally reviewed and interpreted by me today and showed an AHI of 0.7 /hr on 13 cm H2O with  97 % compliance in using more than 4 hours nightly.  The patient has been using and benefiting from PAP use and will continue to benefit from therapy.      COVID-19 Education: The signs and symptoms of COVID-19 were discussed with the patient and how to seek care for testing (follow up with PCP or arrange E-visit).  The importance of social distancing was discussed today.  Time:   Today, I have spent 20 minutes with the patient with telehealth technology discussing the above problems.     Medication Adjustments/Labs and Tests Ordered: Current medicines are reviewed at length with the patient today.  Concerns regarding medicines are outlined above.   Tests Ordered: No orders of the defined types were placed in this encounter.   Medication Changes: No orders of the defined types were placed in this encounter.   Follow Up:  In Person in 1 year(s)  Signed, Fransico Him, MD  02/26/2021 10:58 AM    [place]

## 2021-02-27 ENCOUNTER — Emergency Department (HOSPITAL_COMMUNITY): Payer: Medicare HMO

## 2021-02-27 ENCOUNTER — Encounter (HOSPITAL_COMMUNITY): Payer: Self-pay | Admitting: Emergency Medicine

## 2021-02-27 ENCOUNTER — Emergency Department (HOSPITAL_COMMUNITY)
Admission: EM | Admit: 2021-02-27 | Discharge: 2021-02-27 | Disposition: A | Discharge status: Home / Self Care | Payer: Medicare HMO | Attending: Emergency Medicine | Admitting: Emergency Medicine

## 2021-02-27 ENCOUNTER — Other Ambulatory Visit: Payer: Self-pay

## 2021-02-27 DIAGNOSIS — Z7982 Long term (current) use of aspirin: Secondary | ICD-10-CM | POA: Diagnosis not present

## 2021-02-27 DIAGNOSIS — I5022 Chronic systolic (congestive) heart failure: Secondary | ICD-10-CM | POA: Insufficient documentation

## 2021-02-27 DIAGNOSIS — R1084 Generalized abdominal pain: Secondary | ICD-10-CM | POA: Insufficient documentation

## 2021-02-27 DIAGNOSIS — J449 Chronic obstructive pulmonary disease, unspecified: Secondary | ICD-10-CM | POA: Diagnosis not present

## 2021-02-27 DIAGNOSIS — R1110 Vomiting, unspecified: Secondary | ICD-10-CM | POA: Diagnosis not present

## 2021-02-27 DIAGNOSIS — I10 Essential (primary) hypertension: Secondary | ICD-10-CM | POA: Diagnosis not present

## 2021-02-27 DIAGNOSIS — R197 Diarrhea, unspecified: Secondary | ICD-10-CM | POA: Insufficient documentation

## 2021-02-27 DIAGNOSIS — I700 Atherosclerosis of aorta: Secondary | ICD-10-CM | POA: Diagnosis not present

## 2021-02-27 DIAGNOSIS — I110 Hypertensive heart disease with heart failure: Secondary | ICD-10-CM | POA: Insufficient documentation

## 2021-02-27 DIAGNOSIS — E119 Type 2 diabetes mellitus without complications: Secondary | ICD-10-CM | POA: Insufficient documentation

## 2021-02-27 DIAGNOSIS — Z20822 Contact with and (suspected) exposure to covid-19: Secondary | ICD-10-CM | POA: Insufficient documentation

## 2021-02-27 DIAGNOSIS — R109 Unspecified abdominal pain: Secondary | ICD-10-CM

## 2021-02-27 DIAGNOSIS — Z79899 Other long term (current) drug therapy: Secondary | ICD-10-CM | POA: Insufficient documentation

## 2021-02-27 DIAGNOSIS — K5711 Diverticulosis of small intestine without perforation or abscess with bleeding: Secondary | ICD-10-CM | POA: Diagnosis not present

## 2021-02-27 DIAGNOSIS — R112 Nausea with vomiting, unspecified: Secondary | ICD-10-CM | POA: Diagnosis not present

## 2021-02-27 LAB — URINALYSIS, ROUTINE W REFLEX MICROSCOPIC
Bilirubin Urine: NEGATIVE
Glucose, UA: >=500 mg/dL — AB
Hgb urine dipstick: NEGATIVE
Ketones, ur: NEGATIVE mg/dL
Leukocytes,Ua: NEGATIVE
Nitrite: NEGATIVE
Protein, ur: NEGATIVE mg/dL
Specific Gravity, Urine: >1.03 — ABNORMAL HIGH (ref 1.005–1.030)
pH: 5 (ref 5.0–8.0)

## 2021-02-27 LAB — RESP PANEL BY RT-PCR (FLU A&B, COVID) ARPGX2
Influenza A by PCR: NEGATIVE
Influenza B by PCR: NEGATIVE
SARS Coronavirus 2 by RT PCR: NEGATIVE

## 2021-02-27 LAB — CBC
HCT: 54.1 % — ABNORMAL HIGH (ref 36.0–46.0)
Hemoglobin: 17.5 g/dL — ABNORMAL HIGH (ref 12.0–15.0)
MCH: 31 pg (ref 26.0–34.0)
MCHC: 32.3 g/dL (ref 30.0–36.0)
MCV: 95.9 fL (ref 80.0–100.0)
Platelets: 293 10*3/uL (ref 150–400)
RBC: 5.64 MIL/uL — ABNORMAL HIGH (ref 3.87–5.11)
RDW: 12.9 % (ref 11.5–15.5)
WBC: 15.6 10*3/uL — ABNORMAL HIGH (ref 4.0–10.5)
nRBC: 0 % (ref 0.0–0.2)

## 2021-02-27 LAB — COMPREHENSIVE METABOLIC PANEL
ALT: 30 U/L (ref 0–44)
AST: 19 U/L (ref 15–41)
Albumin: 3.5 g/dL (ref 3.5–5.0)
Alkaline Phosphatase: 137 U/L — ABNORMAL HIGH (ref 38–126)
Anion gap: 11 (ref 5–15)
BUN: 13 mg/dL (ref 6–20)
CO2: 25 mmol/L (ref 22–32)
Calcium: 9 mg/dL (ref 8.9–10.3)
Chloride: 105 mmol/L (ref 98–111)
Creatinine, Ser: 0.86 mg/dL (ref 0.44–1.00)
GFR, Estimated: >60 mL/min (ref >60)
Glucose, Bld: 150 mg/dL — ABNORMAL HIGH (ref 70–99)
Potassium: 4.1 mmol/L (ref 3.5–5.1)
Sodium: 141 mmol/L (ref 135–145)
Total Bilirubin: 1.1 mg/dL (ref 0.3–1.2)
Total Protein: 6.3 g/dL — ABNORMAL LOW (ref 6.5–8.1)

## 2021-02-27 LAB — URINALYSIS, MICROSCOPIC (REFLEX)

## 2021-02-27 LAB — LIPASE, BLOOD: Lipase: 30 U/L (ref 11–51)

## 2021-02-27 MED ORDER — SODIUM CHLORIDE 0.9 % IV BOLUS
1000.0000 mL | Freq: Once | INTRAVENOUS | Status: AC
  Administered 2021-02-27: 1000 mL via INTRAVENOUS

## 2021-02-27 MED ORDER — SUCRALFATE 1 G PO TABS: 1.0000 g | ORAL_TABLET | Freq: Three times a day (TID) | ORAL | 0 refills | Status: DC

## 2021-02-27 MED ORDER — IOHEXOL 300 MG/ML  SOLN
100.0000 mL | Freq: Once | INTRAMUSCULAR | Status: AC | PRN
  Administered 2021-02-27: 100 mL via INTRAVENOUS

## 2021-02-27 MED ORDER — FAMOTIDINE IN NACL 20-0.9 MG/50ML-% IV SOLN
20.0000 mg | Freq: Once | INTRAVENOUS | Status: AC
  Administered 2021-02-27: 20 mg via INTRAVENOUS
  Filled 2021-02-27: qty 50

## 2021-02-27 MED ORDER — ONDANSETRON HCL 4 MG/2ML IJ SOLN
4.0000 mg | Freq: Once | INTRAMUSCULAR | Status: AC
Start: 2021-02-27 — End: 2021-02-27
  Administered 2021-02-27: 4 mg via INTRAVENOUS
  Filled 2021-02-27: qty 2

## 2021-02-27 MED ORDER — ONDANSETRON HCL 4 MG PO TABS: 4.0000 mg | ORAL_TABLET | ORAL | 0 refills | Status: DC | PRN

## 2021-02-27 NOTE — ED Provider Triage Note (Signed)
Emergency Medicine Provider Triage Evaluation Note  MADELENE KAATZ , a 61 y.o. female  was evaluated in triage.  Pt complains of upper abdominal pain, vomiting, diarrhea.  Symptoms began 2 days ago, somewhat improved and then recurred earlier this morning.  She is concerned that it may be caused by eating food on a cruise ship which she returned from yesterday.  No sick contacts with similar symptoms that she is aware of.  Review of Systems  Positive: Abdominal pain, vomiting, diarrhea Negative: Urinary symptoms  Physical Exam  BP 97/75    Pulse (!) 103    Temp 98.6 F (37 C) (Oral)    Resp 18    SpO2 96%  Gen:   Awake, no distress   Resp:  Normal effort  MSK:   Moves extremities without difficulty  Other:  Abdomen soft and not distended, reports some tenderness in the upper abdominal area  Medical Decision Making  Medically screening exam initiated at 8:56 AM.  Appropriate orders placed.  ANGILA WOMBLES was informed that the remainder of the evaluation will be completed by another provider, this initial triage assessment does not replace that evaluation, and the importance of remaining in the ED until their evaluation is complete.  Lab work and EKG ordered due to location of pain.   Dietrich Pates, PA-C 02/27/21 [phone]

## 2021-02-27 NOTE — ED Provider Notes (Addendum)
[facility] EMERGENCY DEPARTMENT Provider Note   CSN: [id number] Arrival date & time: 02/27/21  4008     History  Chief Complaint  Patient presents with   Abdominal Pain    Judy [name] is a 61 y.o. female.  This is a 61 y.o. female with significant medical history as below, including COPD, obesity, chronic pain syndrome who presents to the ED with complaint of nausea, vomiting, diarrhea.  Patient ports he recently turned from vacation on a cruise ship.  Upon returning she experienced sudden onset of nausea, vomiting, diarrhea, abdominal pain.  Reduced oral intake associated with nausea.  Symptom onset Tuesday.  Symptoms gradually improved however returned again this morning and similar quality.  Multiple episodes of nausea and vomiting, diarrhea prior to arrival to the ER today.  Has not eaten much in the past 24 hours.  Not taking home medications due to nausea.  No fevers or chills.  No chest pain or dyspnea.  No black stool or blood per rectum.  Does report increased urinary frequency but no urgency or dysuria.  Spouse that accompanied her has not been symptomatic.  No dyspnea or chest pain.  No rashes.  No IV drug use.  No recent alcohol use.  The history is provided by the patient. No language interpreter was used.  Abdominal Pain Pain location:  Generalized Pain quality: aching and cramping   Pain radiates to:  Does not radiate Pain severity:  Mild Onset quality:  Sudden Timing:  Intermittent Progression:  Waxing and waning Context: recent travel   Context: not alcohol use   Associated symptoms: no chest pain, no chills, no cough, no fever, no hematuria, no nausea, no shortness of breath and no vomiting      Patient Active Problem List   Diagnosis Date Noted   Cervical radiculopathy 01/28/2021   Ulnar impingement syndrome of right upper extremity 11/10/2020   Anxiety 09/03/2019   Arthritis 09/03/2019   Depression 09/03/2019   H/O scoliosis  09/03/2019   History of migraine headaches 09/03/2019   Neuropathy 09/03/2019   Post-menopausal 09/03/2019   Seasonal allergies 09/03/2019   Vitamin D deficiency 09/03/2019   Hypertensive heart disease with CHF (congestive heart failure) (HCC) 09/03/2019   Other specified hypothyroidism 09/03/2019   Mixed hyperlipidemia 07/30/2019   OSA (obstructive sleep apnea)    Leg swelling 10/09/2014   Chronic systolic CHF (congestive heart failure), NYHA class 2 (HCC) 10/07/2014   Morbid obesity due to excess calories (HCC) 08/23/2014   DCM (dilated cardiomyopathy) (HCC) 04/25/2014   COPD (chronic obstructive pulmonary disease) (HCC)    Chronic respiratory failure with hypoxia (HCC) 04/10/2014   CAP (community acquired pneumonia) 02/21/2014   Hypertension associated with diabetes (HCC) 10/26/2013   COPD GOLD  III  10/09/2013   Pulmonary infiltrates only seen on CT chest 10/07/13  10/09/2013   Ganglion cyst 10/11/2012   Other ganglion and cyst of synovium, tendon, and bursa(727.49) 09/20/2012   Degenerative arthritis of hip 07/26/2012   Foot pain, bilateral 05/31/2012   Hip pain, right 05/31/2012   Facet arthropathy, lumbar 03/08/2012   Flatback syndrome 03/08/2012   Long term current use of opiate analgesic 03/08/2012   Pain syndrome, chronic 03/08/2012   Knee pain 01/06/2010   Mid back pain 01/06/2010   Neck pain 01/06/2010   Transfusion history 11/14/2006     Home Medications Prior to Admission medications   Medication Sig Start Date End Date Taking? Authorizing Provider  ondansetron (ZOFRAN) 4 MG tablet  Take 1 tablet (4 mg total) by mouth every 4 (four) hours as needed for nausea or vomiting. 02/27/21  Yes Wynona Dove A, DO  sucralfate (CARAFATE) 1 g tablet Take 1 tablet (1 g total) by mouth 4 (four) times daily -  with meals and at bedtime for 7 days. 02/27/21 03/06/21 Yes Wynona Dove A, DO  albuterol (VENTOLIN HFA) 108 (90 Base) MCG/ACT inhaler Inhale 2 puffs into the lungs every 6  (six) hours as needed for wheezing or shortness of breath. 04/08/20   Rochel Brome, MD  aspirin 81 MG tablet Take 81 mg by mouth at bedtime.     [provider]  atorvastatin (LIPITOR) 20 MG tablet Take 0.5 tablets (10 mg total) by mouth daily. Take half tablet daily (10 mg total daily) 01/28/21   Cox, Kirsten, MD  buPROPion (WELLBUTRIN XL) 300 MG 24 hr tablet TAKE 1 TABLET EVERY DAY 05/02/20   Cox, Elnita Maxwell, MD  carvedilol (COREG) 6.25 MG tablet Take 1 tablet (6.25 mg total) by mouth 2 (two) times daily. 07/20/20   Sueanne Margarita, MD  clonazePAM (KLONOPIN) 0.5 MG tablet Take 1 tablet (0.5 mg total) by mouth 2 (two) times daily as needed for anxiety (panic attacks). 04/09/20   Cox, Elnita Maxwell, MD  Coenzyme Q10 (CO Q 10) 100 MG CAPS Take 200 mg by mouth daily.    [provider]  diphenhydrAMINE (BENADRYL) 25 MG tablet Take 50 mg by mouth at bedtime as needed.    [provider]  Dulaglutide (TRULICITY) 3 0000000 SOPN Inject 3 mg as directed once a week. 04/08/20   Cox, Elnita Maxwell, MD  FARXIGA 10 MG TABS tablet Take 1 tablet (10 mg total) by mouth daily. 01/29/20   CoxElnita Maxwell, MD  fenofibrate 160 MG tablet Take 160 mg by mouth daily. Patient not taking: Reported on 02/26/2021    [provider]  gabapentin (NEURONTIN) 800 MG tablet Take 1 tablet (800 mg total) by mouth 3 (three) times daily. 01/29/21   Cox, Elnita Maxwell, MD  levothyroxine (SYNTHROID) 75 MCG tablet TAKE 1 TABLET (75 MCG TOTAL) BY MOUTH DAILY. 07/14/20   Marge Duncans, PA-C  Lutein 20 MG TABS Take 1 tablet by mouth daily.    [provider]  meloxicam (MOBIC) 15 MG tablet TAKE 1 TABLET (15 MG TOTAL) BY MOUTH DAILY. 01/03/21   CoxElnita Maxwell, MD  Multiple Vitamin (MULTIVITAMIN WITH MINERALS) TABS tablet Take 1 tablet by mouth daily.    [provider]  OXYGEN 2lpm 2 lpm as needed    [provider]  sacubitril-valsartan (ENTRESTO) 24-26 MG Take 1 tablet by mouth 2 (two) times daily. 07/20/20    Sueanne Margarita, MD  Tiotropium Bromide-Olodaterol (STIOLTO RESPIMAT) 2.5-2.5 MCG/ACT AERS INHALE 2 PUFFS INTO THE LUNGS DAILY. 03/18/20   Tanda Rockers, MD  tiZANidine (ZANAFLEX) 4 MG tablet TAKE 1 TABLET EVERY 6 HOURS AS NEEDED FOR MUSCLE SPASM(S) 07/14/20   CoxElnita Maxwell, MD  traMADol (ULTRAM) 50 MG tablet Take 1 tablet (50 mg total) by mouth every 6 (six) hours as needed for severe pain. Patient taking differently: Take 50 mg by mouth. Take one four times a day as needed for pain 01/20/21   Cox, Elnita Maxwell, MD  UNABLE TO FIND CPAP with o2 2lpm  DME- [facility]    [provider]  VASCEPA 1 g capsule TAKE 2 CAPSULES BY MOUTH 2 TIMES DAILY. 11/02/20   CoxElnita Maxwell, MD  Vitamin D, Ergocalciferol, (DRISDOL) 1.25 MG (50000 UNIT) CAPS capsule  TAKE 1 CAPSULE EVERY 7 DAYS 01/04/21   CoxElnita Maxwell, MD      Allergies    Demerol [meperidine]    Review of Systems   Review of Systems  Constitutional:  Negative for chills and fever.  HENT:  Negative for facial swelling and trouble swallowing.   Eyes:  Negative for photophobia and visual disturbance.  Respiratory:  Negative for cough and shortness of breath.   Cardiovascular:  Negative for chest pain and palpitations.  Gastrointestinal:  Positive for abdominal pain. Negative for nausea and vomiting.  Endocrine: Negative for polydipsia and polyuria.  Genitourinary:  Negative for difficulty urinating and hematuria.  Musculoskeletal:  Negative for gait problem and joint swelling.  Skin:  Negative for pallor and rash.  Neurological:  Negative for syncope and headaches.  Psychiatric/Behavioral:  Negative for agitation and confusion.    Physical Exam Updated Vital Signs BP 105/77    Pulse 95    Temp 98.6 F (37 C) (Oral)    Resp 14    Ht 5\' 5"  (1.651 m)    Wt 123.4 kg    SpO2 94%    BMI 45.26 kg/m  Physical Exam Vitals and nursing note reviewed.  Constitutional:      General: She is not in acute distress.    Appearance: Normal appearance. She is  well-developed. She is obese. She is not ill-appearing, toxic-appearing or diaphoretic.  HENT:     Head: Normocephalic and atraumatic.     Right Ear: External ear normal.     Left Ear: External ear normal.     Nose: Nose normal.     Mouth/Throat:     Mouth: Mucous membranes are moist.  Eyes:     General: No scleral icterus.       Right eye: No discharge.        Left eye: No discharge.  Cardiovascular:     Rate and Rhythm: Normal rate and regular rhythm.     Pulses: Normal pulses.     Heart sounds: Normal heart sounds.  Pulmonary:     Effort: Pulmonary effort is normal. No respiratory distress.     Breath sounds: Normal breath sounds.  Abdominal:     General: Abdomen is flat.     Palpations: Abdomen is soft.     Tenderness: There is no abdominal tenderness. There is no guarding or rebound.  Musculoskeletal:        General: Normal range of motion.     Cervical back: Normal range of motion.     Right lower leg: No edema.     Left lower leg: No edema.  Skin:    General: Skin is warm and dry.     Capillary Refill: Capillary refill takes less than 2 seconds.  Neurological:     Mental Status: She is alert.  Psychiatric:        Mood and Affect: Mood normal.        Behavior: Behavior normal.    ED Results / Procedures / Treatments   Labs (all labs ordered are listed, but only abnormal results are displayed) Labs Reviewed  COMPREHENSIVE METABOLIC PANEL - Abnormal; Notable for the following components:      Result Value   Glucose, Bld 150 (*)    Total Protein 6.3 (*)    Alkaline Phosphatase 137 (*)    All other components within normal limits  CBC - Abnormal; Notable for the following components:   WBC 15.6 (*)    RBC 5.64 (*)  Hemoglobin 17.5 (*)    HCT 54.1 (*)    All other components within normal limits  URINALYSIS, ROUTINE W REFLEX MICROSCOPIC - Abnormal; Notable for the following components:   Specific Gravity, Urine >1.030 (*)    Glucose, UA >=500 (*)    All  other components within normal limits  URINALYSIS, MICROSCOPIC (REFLEX) - Abnormal; Notable for the following components:   Bacteria, UA RARE (*)    All other components within normal limits  RESP PANEL BY RT-PCR (FLU A&B, COVID) ARPGX2  LIPASE, BLOOD    EKG EKG Interpretation  Date/Time:  Saturday February 27 2021 11:19:36 EST Ventricular Rate:  95 PR Interval:  199 QRS Duration: 100 QT Interval:  361 QTC Calculation: 454 R Axis:   268 Text Interpretation: Sinus rhythm Borderline prolonged PR interval Consider left atrial enlargement similar to prior tracing 3/16 Confirmed by Wynona Dove ([id number]) on 02/27/2021 12:31:14 PM  Radiology CT ABDOMEN PELVIS W CONTRAST  Result Date: 02/27/2021 CLINICAL DATA:  61 year old female with history of acute onset of nonlocalized abdominal pain with intermittent vomiting and diarrhea since Wednesday. EXAM: CT ABDOMEN AND PELVIS WITH CONTRAST TECHNIQUE: Multidetector CT imaging of the abdomen and pelvis was performed using the standard protocol following bolus administration of intravenous contrast. RADIATION DOSE REDUCTION: This exam was performed according to the departmental dose-optimization program which includes automated exposure control, adjustment of the mA and/or kV according to patient size and/or use of iterative reconstruction technique. CONTRAST:  131mL OMNIPAQUE IOHEXOL 300 MG/ML  SOLN COMPARISON:  No priors. FINDINGS: Lower chest: Mild scarring in the medial aspect of the right lower lobe. Hepatobiliary: No suspicious cystic or solid hepatic lesions. No intra or extrahepatic biliary ductal dilatation. Gallbladder is normal in appearance. Pancreas: No pancreatic mass. No pancreatic ductal dilatation. No pancreatic or peripancreatic fluid collections or inflammatory changes. Spleen: Unremarkable. Adrenals/Urinary Tract: Bilateral kidneys and bilateral adrenal glands are normal in appearance. No hydroureteronephrosis. Urinary bladder is normal in  appearance. Stomach/Bowel: The appearance of the stomach is normal. Small duodenal diverticulum extending off the posteromedial aspect of the second portion of the duodenum, without surrounding inflammatory changes to indicate an associated diverticulitis at this time. No pathologic dilatation of small bowel or colon. Normal appendix. Vascular/Lymphatic: Aortic atherosclerosis, without evidence of aneurysm or dissection in the abdominal or pelvic vasculature. No lymphadenopathy noted in the abdomen or pelvis. Reproductive: Status post hysterectomy. Ovaries are not confidently identified may be surgically absent or atrophic. Other: No significant volume of ascites.  No pneumoperitoneum. Musculoskeletal: Orthopedic fixation rod in the lower thoracic and lumbar spine. There are no aggressive appearing lytic or blastic lesions noted in the visualized portions of the skeleton. IMPRESSION: 1. No acute findings are noted in the abdomen or pelvis to account for the patient's symptoms. 2. Aortic atherosclerosis. 3. Postoperative changes and incidental findings, as above. Electronically Signed   By: Vinnie Langton M.D.   On: 02/27/2021 12:38    Procedures Procedures    Medications Ordered in ED Medications  sodium chloride 0.9 % bolus 1,000 mL (1,000 mLs Intravenous New Bag/Given 02/27/21 1130)  ondansetron (ZOFRAN) injection 4 mg (4 mg Intravenous Given 02/27/21 1130)  famotidine (PEPCID) IVPB 20 mg premix (0 mg Intravenous Stopped 02/27/21 1218)  iohexol (OMNIPAQUE) 300 MG/ML solution 100 mL (100 mLs Intravenous Contrast Given 02/27/21 1215)    ED Course/ Medical Decision Making/ A&P  Medical Decision Making   CC: Abdominal pain, nausea, vomiting, diarrhea  This patient complains of above; this involves an extensive number of treatment options and is a complaint that carries with it a high risk of complications and morbidity. Vital signs were reviewed. Serious etiologies  considered.  Record review:  Previous records obtained and reviewed   Additional history obtained from spouse  Work up as above, notable for:  Labs & imaging results that were available during my care of the patient were reviewed by me and considered in my medical decision making.   I ordered imaging studies which included CT abdomen pelvis IV contrast and I independently visualized and interpreted imaging which showed no acute process  Labs reviewed and are stable, there is elevation to white blood cell count likely reactive/secondary to vomiting   Management: IV fluids, Zofran, Pepcid  Reassessment:  Patient reports she is feeling significantly better following intervention in the ER.  She is able tolerate oral intake without difficulty.  Symptoms likely secondary to viral gastroenteritis.  Will discharge patient with dietary precautions, antiemetics, outpatient follow-up PCP.  She may require stool cultures if diarrhea does not resolve spontaneously.  Social determinants of health, tobacco use, reduced mobility  The patient's overall condition has improved, the patient presents with abdominal pain without signs of peritonitis, or other life-threatening serious etiology.  Detailed discussions were had with the patient regarding current findings, and need for close f/u with PCP or on call doctor. The patient appears stable for discharge and has been instructed to return immediately if the symptoms worsen in any way or if not improved for re-evaluation. Patient verbalized understanding and is in agreement with current care plan.  All questions answered prior to discharge.   Cardiac monitoring reviewed by myself demonstrates normal sinus rhythm       This chart was dictated using voice recognition software.  Despite best efforts to proofread,  errors can occur which can change the documentation meaning.  Final Clinical Impression(s) / ED Diagnoses Final diagnoses:  Abdominal pain,  unspecified abdominal location  Nausea and vomiting, unspecified vomiting type  Diarrhea, unspecified type    Rx / DC Orders ED Discharge Orders          Ordered    ondansetron (ZOFRAN) 4 MG tablet  Every 4 hours PRN        02/27/21 1336    sucralfate (CARAFATE) 1 g tablet  3 times daily with meals & bedtime        02/27/21 1336              Jeanell Sparrow, DO 02/27/21 1335    Jeanell Sparrow, DO 02/27/21 1339

## 2021-02-27 NOTE — Discharge Instructions (Addendum)

## 2021-02-27 NOTE — ED Triage Notes (Signed)
Pt on a cruise to [place] 1/7-1/12.  Reports intermittent vomiting and diarrhea that started on Wednesday while on cruise.  Had felt better and symptoms started again last night.  Also reports LUQ pain.

## 2021-03-01 DIAGNOSIS — H524 Presbyopia: Secondary | ICD-10-CM | POA: Diagnosis not present

## 2021-03-01 DIAGNOSIS — H35033 Hypertensive retinopathy, bilateral: Secondary | ICD-10-CM | POA: Diagnosis not present

## 2021-03-01 DIAGNOSIS — Z7985 Long-term (current) use of injectable non-insulin antidiabetic drugs: Secondary | ICD-10-CM | POA: Diagnosis not present

## 2021-03-01 DIAGNOSIS — H43813 Vitreous degeneration, bilateral: Secondary | ICD-10-CM | POA: Diagnosis not present

## 2021-03-01 DIAGNOSIS — E119 Type 2 diabetes mellitus without complications: Secondary | ICD-10-CM | POA: Diagnosis not present

## 2021-03-01 LAB — HM DIABETES EYE EXAM

## 2021-03-02 ENCOUNTER — Telehealth: Payer: Self-pay

## 2021-03-02 NOTE — Chronic Care Management (AMB) (Signed)
Chronic Care Management Pharmacy Assistant   Name: Judy [name]  MRN: [id number] DOB: [birth year]  Reason for Encounter: Disease State call for DM    Recent office visits:  01/29/21 Blane Ohara MD.Orders Only. Increased Gabapentin from 400 mg to 800 mg 3 times daily.   01/26/21 Caro Hight LPN. Seen for Medicare Annual Wellness. No med changes.   01/19/21 Blane Ohara MD. Seen for DM, HLD and HTN. Increase gabapentin 400 mg 2 pills three times a day. Increase tramadol 50 mg once four times a day as needed. Start meloxicam 15 mg daily. Decrease lipitor 20 mg 1/2 daily. Start fenofibrate 160 mg once daily. Referral to Neurosurgery.   Recent consult visits:  02/26/21 (Cardiology) Armanda Magic MD. Seen for Sleep Apnea, CHF and HTN. No med changes.  Hospital visits:  Medication Reconciliation was completed by comparing discharge summary, patients EMR and Pharmacy list, and upon discussion with patient.  Admitted to the hospital on 02/27/21 due to Abdominal Pain . Discharge date was 02/27/21. Discharged from [facility].    New?Medications Started at [facility] Discharge:?? -started Zofran HCI 4 mg every 4 hours prn   -Sucralfate 1g 3 times daily for 7 days   -All other medications will remain the same.    Medications: Outpatient Encounter Medications as of 03/02/2021  Medication Sig   albuterol (VENTOLIN HFA) 108 (90 Base) MCG/ACT inhaler Inhale 2 puffs into the lungs every 6 (six) hours as needed for wheezing or shortness of breath.   aspirin 81 MG tablet Take 81 mg by mouth at bedtime.    atorvastatin (LIPITOR) 20 MG tablet Take 0.5 tablets (10 mg total) by mouth daily. Take half tablet daily (10 mg total daily)   buPROPion (WELLBUTRIN XL) 300 MG 24 hr tablet TAKE 1 TABLET EVERY DAY   carvedilol (COREG) 6.25 MG tablet Take 1 tablet (6.25 mg total) by mouth 2 (two) times daily.   clonazePAM (KLONOPIN) 0.5 MG tablet Take 1 tablet (0.5 mg total) by mouth 2 (two)  times daily as needed for anxiety (panic attacks).   Coenzyme Q10 (CO Q 10) 100 MG CAPS Take 200 mg by mouth daily.   diphenhydrAMINE (BENADRYL) 25 MG tablet Take 50 mg by mouth at bedtime as needed.   Dulaglutide (TRULICITY) 3 MG/0.5ML SOPN Inject 3 mg as directed once a week.   FARXIGA 10 MG TABS tablet Take 1 tablet (10 mg total) by mouth daily.   fenofibrate 160 MG tablet Take 160 mg by mouth daily. (Patient not taking: Reported on 02/26/2021)   gabapentin (NEURONTIN) 800 MG tablet Take 1 tablet (800 mg total) by mouth 3 (three) times daily.   levothyroxine (SYNTHROID) 75 MCG tablet TAKE 1 TABLET (75 MCG TOTAL) BY MOUTH DAILY.   Lutein 20 MG TABS Take 1 tablet by mouth daily.   meloxicam (MOBIC) 15 MG tablet TAKE 1 TABLET (15 MG TOTAL) BY MOUTH DAILY.   Multiple Vitamin (MULTIVITAMIN WITH MINERALS) TABS tablet Take 1 tablet by mouth daily.   ondansetron (ZOFRAN) 4 MG tablet Take 1 tablet (4 mg total) by mouth every 4 (four) hours as needed for nausea or vomiting.   OXYGEN 2lpm 2 lpm as needed   sacubitril-valsartan (ENTRESTO) 24-26 MG Take 1 tablet by mouth 2 (two) times daily.   sucralfate (CARAFATE) 1 g tablet Take 1 tablet (1 g total) by mouth 4 (four) times daily -  with meals and at bedtime for 7 days.   Tiotropium Bromide-Olodaterol (STIOLTO RESPIMAT)  2.5-2.5 MCG/ACT AERS INHALE 2 PUFFS INTO THE LUNGS DAILY.   tiZANidine (ZANAFLEX) 4 MG tablet TAKE 1 TABLET EVERY 6 HOURS AS NEEDED FOR MUSCLE SPASM(S)   traMADol (ULTRAM) 50 MG tablet Take 1 tablet (50 mg total) by mouth every 6 (six) hours as needed for severe pain. (Patient taking differently: Take 50 mg by mouth. Take one four times a day as needed for pain)   UNABLE TO FIND CPAP with o2 2lpm  DME- [facility]   VASCEPA 1 g capsule TAKE 2 CAPSULES BY MOUTH 2 TIMES DAILY.   Vitamin D, Ergocalciferol, (DRISDOL) 1.25 MG (50000 UNIT) CAPS capsule TAKE 1 CAPSULE EVERY 7 DAYS   No facility-administered encounter medications on file as of  03/02/2021.   Recent Relevant Labs: Lab Results  Component Value Date/Time   HGBA1C 5.9 (H) 01/15/2021 09:48 AM   HGBA1C 6.1 (H) 10/13/2020 11:28 AM   MICROALBUR Negative 09/03/2019 02:33 PM    Kidney Function Lab Results  Component Value Date/Time   CREATININE 0.86 02/27/2021 08:51 AM   CREATININE 0.80 01/15/2021 09:48 AM   CREATININE 0.75 12/09/2015 10:57 AM   CREATININE 0.69 10/23/2015 02:37 PM   GFR 100.87 10/01/2014 10:54 AM   GFRNONAA >60 02/27/2021 08:51 AM   GFRAA 87 04/06/2020 02:50 PM     Current antihyperglycemic regimen:  Trulicity 3mg /0.1ml Inject 3 mg once a week (PAP) Farxiga 10 mg daily (PAP) Patient verbally confirms she is taking the above medications as directed. Yes  What recent interventions/DTPs have been made to improve glycemic control:  Pt denies any changes   Have there been any recent hospitalizations or ED visits since last visit with CPP? Yes   Patient denies hypoglycemic symptoms  Patient denies hyperglycemic symptoms  How often are you checking your blood sugar? Pt has not checked in the last few weeks due to being sick with a stomach bug and then cold symptoms but stated they run good and denies any abnormal symptoms.  On insulin? No  During the week, how often does your blood glucose drop below 70? Never  Are you checking your feet daily/regularly? Yes  Adherence Review: Is the patient currently on a STATIN medication? Yes Is the patient currently on ACE/ARB medication? Yes Does the patient have >5 day gap between last estimated fill dates? CPP to review  Care Gaps: Last eye exam / Retinopathy Screening? 02/24/20 Last Annual Wellness Visit? 01/26/21 Last Diabetic Foot Exam? 07/09/20   Star Rating Drugs:  Medication:  Last Fill: Day Supply Trulicity       (PAP) 07/11/20       (PAP)  Marcelline Deist, CMA Clinical Pharmacist Assistant  [phone]

## 2021-03-04 DIAGNOSIS — M25511 Pain in right shoulder: Secondary | ICD-10-CM | POA: Diagnosis not present

## 2021-03-04 DIAGNOSIS — R202 Paresthesia of skin: Secondary | ICD-10-CM | POA: Diagnosis not present

## 2021-03-04 DIAGNOSIS — R200 Anesthesia of skin: Secondary | ICD-10-CM | POA: Diagnosis not present

## 2021-03-05 ENCOUNTER — Other Ambulatory Visit: Payer: Self-pay | Admitting: Student

## 2021-03-05 DIAGNOSIS — M25511 Pain in right shoulder: Secondary | ICD-10-CM

## 2021-03-08 DIAGNOSIS — G5621 Lesion of ulnar nerve, right upper limb: Secondary | ICD-10-CM | POA: Diagnosis not present

## 2021-03-08 DIAGNOSIS — M5412 Radiculopathy, cervical region: Secondary | ICD-10-CM | POA: Diagnosis not present

## 2021-03-09 ENCOUNTER — Encounter: Payer: Self-pay | Admitting: Family Medicine

## 2021-03-11 DIAGNOSIS — J4520 Mild intermittent asthma, uncomplicated: Secondary | ICD-10-CM | POA: Diagnosis not present

## 2021-03-11 DIAGNOSIS — J449 Chronic obstructive pulmonary disease, unspecified: Secondary | ICD-10-CM | POA: Diagnosis not present

## 2021-03-27 ENCOUNTER — Ambulatory Visit
Admission: RE | Admit: 2021-03-27 | Discharge: 2021-03-27 | Disposition: A | Discharge status: Home / Self Care | Payer: Medicare HMO | Source: Ambulatory Visit | Attending: Student | Admitting: Student

## 2021-03-27 ENCOUNTER — Other Ambulatory Visit: Payer: Self-pay

## 2021-03-27 DIAGNOSIS — M25511 Pain in right shoulder: Secondary | ICD-10-CM | POA: Diagnosis not present

## 2021-03-30 DIAGNOSIS — Z6841 Body Mass Index (BMI) 40.0 and over, adult: Secondary | ICD-10-CM | POA: Diagnosis not present

## 2021-03-30 DIAGNOSIS — M25511 Pain in right shoulder: Secondary | ICD-10-CM | POA: Diagnosis not present

## 2021-03-31 DIAGNOSIS — G5621 Lesion of ulnar nerve, right upper limb: Secondary | ICD-10-CM | POA: Diagnosis not present

## 2021-03-31 DIAGNOSIS — M65341 Trigger finger, right ring finger: Secondary | ICD-10-CM | POA: Diagnosis not present

## 2021-04-06 ENCOUNTER — Telehealth: Payer: Self-pay

## 2021-04-06 NOTE — Progress Notes (Signed)
Chronic Care Management Pharmacy Assistant   Name: Judy [name]  MRN: [id number] DOB: [birth year]   Reason for Encounter: Disease State call for HTN    Recent office visits:  None  Recent consult visits:  None  Hospital visits:  None  Medications: Outpatient Encounter Medications as of 04/06/2021  Medication Sig   albuterol (VENTOLIN HFA) 108 (90 Base) MCG/ACT inhaler Inhale 2 puffs into the lungs every 6 (six) hours as needed for wheezing or shortness of breath.   aspirin 81 MG tablet Take 81 mg by mouth at bedtime.    atorvastatin (LIPITOR) 20 MG tablet Take 0.5 tablets (10 mg total) by mouth daily. Take half tablet daily (10 mg total daily)   buPROPion (WELLBUTRIN XL) 300 MG 24 hr tablet TAKE 1 TABLET EVERY DAY   carvedilol (COREG) 6.25 MG tablet Take 1 tablet (6.25 mg total) by mouth 2 (two) times daily.   clonazePAM (KLONOPIN) 0.5 MG tablet Take 1 tablet (0.5 mg total) by mouth 2 (two) times daily as needed for anxiety (panic attacks).   Coenzyme Q10 (CO Q 10) 100 MG CAPS Take 200 mg by mouth daily.   diphenhydrAMINE (BENADRYL) 25 MG tablet Take 50 mg by mouth at bedtime as needed.   Dulaglutide (TRULICITY) 3 MG/0.5ML SOPN Inject 3 mg as directed once a week.   FARXIGA 10 MG TABS tablet Take 1 tablet (10 mg total) by mouth daily.   fenofibrate 160 MG tablet Take 160 mg by mouth daily. (Patient not taking: Reported on 02/26/2021)   gabapentin (NEURONTIN) 800 MG tablet Take 1 tablet (800 mg total) by mouth 3 (three) times daily.   levothyroxine (SYNTHROID) 75 MCG tablet TAKE 1 TABLET (75 MCG TOTAL) BY MOUTH DAILY.   Lutein 20 MG TABS Take 1 tablet by mouth daily.   meloxicam (MOBIC) 15 MG tablet TAKE 1 TABLET (15 MG TOTAL) BY MOUTH DAILY.   Multiple Vitamin (MULTIVITAMIN WITH MINERALS) TABS tablet Take 1 tablet by mouth daily.   ondansetron (ZOFRAN) 4 MG tablet Take 1 tablet (4 mg total) by mouth every 4 (four) hours as needed for nausea or vomiting.   OXYGEN 2lpm 2 lpm  as needed   sacubitril-valsartan (ENTRESTO) 24-26 MG Take 1 tablet by mouth 2 (two) times daily.   sucralfate (CARAFATE) 1 g tablet Take 1 tablet (1 g total) by mouth 4 (four) times daily -  with meals and at bedtime for 7 days.   Tiotropium Bromide-Olodaterol (STIOLTO RESPIMAT) 2.5-2.5 MCG/ACT AERS INHALE 2 PUFFS INTO THE LUNGS DAILY.   tiZANidine (ZANAFLEX) 4 MG tablet TAKE 1 TABLET EVERY 6 HOURS AS NEEDED FOR MUSCLE SPASM(S)   traMADol (ULTRAM) 50 MG tablet Take 1 tablet (50 mg total) by mouth every 6 (six) hours as needed for severe pain. (Patient taking differently: Take 50 mg by mouth. Take one four times a day as needed for pain)   UNABLE TO FIND CPAP with o2 2lpm  DME- [facility]   VASCEPA 1 g capsule TAKE 2 CAPSULES BY MOUTH 2 TIMES DAILY.   Vitamin D, Ergocalciferol, (DRISDOL) 1.25 MG (50000 UNIT) CAPS capsule TAKE 1 CAPSULE EVERY 7 DAYS   No facility-administered encounter medications on file as of 04/06/2021.    Recent Office Vitals: BP Readings from Last 3 Encounters:  02/27/21 114/69  02/26/21 116/74  01/26/21 118/72   Pulse Readings from Last 3 Encounters:  02/27/21 98  02/26/21 78  01/26/21 73    Wt Readings from Last 3 Encounters:  02/27/21  272 lb (123.4 kg)  02/26/21 272 lb (123.4 kg)  01/26/21 279 lb (126.6 kg)     Kidney Function Lab Results  Component Value Date/Time   CREATININE 0.86 02/27/2021 08:51 AM   CREATININE 0.80 01/15/2021 09:48 AM   CREATININE 0.75 12/09/2015 10:57 AM   CREATININE 0.69 10/23/2015 02:37 PM   GFR 100.87 10/01/2014 10:54 AM   GFRNONAA >60 02/27/2021 08:51 AM   GFRAA 87 04/06/2020 02:50 PM    BMP Latest Ref Rng & Units 02/27/2021 01/15/2021 10/13/2020  Glucose 70 - 99 mg/dL 269(S) 854(O) 88  BUN 6 - 20 mg/dL 13 14 12   Creatinine 0.44 - 1.00 mg/dL 2.70 3.50  BUN/Creat Ratio 12 - 28 - 18 15  Sodium 135 - 145 mmol/L 141 143 145(H)  Potassium 3.5 - 5.1 mmol/L 4.1 4.8 4.6  Chloride 98 - 111 mmol/L 105 104 107(H)  CO2 22 - 32 mmol/L  25 25 24   Calcium 8.9 - 10.3 mg/dL 9.0 9.8 9.4     Current antihypertensive regimen:  Carvedilol 6.25 mg  Patient verbally confirms she is taking the above medications as directed. Yes  How often are you checking your Blood Pressure? infrequently  Current home BP readings: Pt has not checked recently. Only checks if something feels off   Wrist or arm cuff:Arm  Caffeine intake:1 cup of tea and 1 soda  Salt intake:Limited  OTC medications including pseudoephedrine or NSAIDs? None  Any readings above 180/120? No  What recent interventions/DTPs have been made by any provider to improve Blood Pressure control since last CPP Visit: Pt denies any changes  Any recent hospitalizations or ED visits since last visit with CPP? No  What diet changes have been made to improve Blood Pressure Control?  Pt denies any changes   What exercise is being done to improve your Blood Pressure Control?  Pt walks daily with an up walker   Adherence Review: Is the patient currently on ACE/ARB medication? Yes Does the patient have >5 day gap between last estimated fill dates? CPP to review  Care Gaps: Last annual wellness visit?01/26/21  Star Rating Drugs:  Medication:  Last Fill: Day Supply None noted   , [facility] Clinical Pharmacist Assistant  [phone]

## 2021-04-11 DIAGNOSIS — J4520 Mild intermittent asthma, uncomplicated: Secondary | ICD-10-CM | POA: Diagnosis not present

## 2021-04-11 DIAGNOSIS — J449 Chronic obstructive pulmonary disease, unspecified: Secondary | ICD-10-CM | POA: Diagnosis not present

## 2021-04-13 ENCOUNTER — Other Ambulatory Visit: Payer: Self-pay | Admitting: Family Medicine

## 2021-04-13 DIAGNOSIS — M542 Cervicalgia: Secondary | ICD-10-CM

## 2021-04-13 DIAGNOSIS — M25831 Other specified joint disorders, right wrist: Secondary | ICD-10-CM

## 2021-04-13 MED ORDER — MELOXICAM 15 MG PO TABS: 15.0000 mg | ORAL_TABLET | Freq: Every day | ORAL | 3 refills | Status: DC

## 2021-04-19 ENCOUNTER — Other Ambulatory Visit: Payer: Self-pay

## 2021-04-19 ENCOUNTER — Telehealth: Payer: Self-pay

## 2021-04-19 MED ORDER — FARXIGA 10 MG PO TABS: 10.0000 mg | ORAL_TABLET | Freq: Every day | ORAL | 3 refills | Status: DC

## 2021-04-19 NOTE — Progress Notes (Signed)
? ? ?Chronic Care Management ?Pharmacy Assistant  ? ?Name: Judy [name]  MRN: [id number] DOB: Feb 10, 1961 ? ?Reason for Encounter: Disease State call for HTN  ?  ?Recent office visits:  ?None ? ?Recent consult visits:  ?None ? ?Hospital visits:  ?None ? ?Medications: ?Outpatient Encounter Medications as of 04/19/2021  ?Medication Sig  ? albuterol (VENTOLIN HFA) 108 (90 Base) MCG/ACT inhaler Inhale 2 puffs into the lungs every 6 (six) hours as needed for wheezing or shortness of breath.  ? aspirin 81 MG tablet Take 81 mg by mouth at bedtime.   ? atorvastatin (LIPITOR) 20 MG tablet Take 0.5 tablets (10 mg total) by mouth daily. Take half tablet daily (10 mg total daily)  ? buPROPion (WELLBUTRIN XL) 300 MG 24 hr tablet TAKE 1 TABLET EVERY DAY  ? carvedilol (COREG) 6.25 MG tablet Take 1 tablet (6.25 mg total) by mouth 2 (two) times daily.  ? clonazePAM (KLONOPIN) 0.5 MG tablet Take 1 tablet (0.5 mg total) by mouth 2 (two) times daily as needed for anxiety (panic attacks).  ? Coenzyme Q10 (CO Q 10) 100 MG CAPS Take 200 mg by mouth daily.  ? diphenhydrAMINE (BENADRYL) 25 MG tablet Take 50 mg by mouth at bedtime as needed.  ? Dulaglutide (TRULICITY) 3 0000000 SOPN Inject 3 mg as directed once a week.  ? FARXIGA 10 MG TABS tablet Take 1 tablet (10 mg total) by mouth daily.  ? fenofibrate 160 MG tablet Take 160 mg by mouth daily. (Patient not taking: Reported on 02/26/2021)  ? gabapentin (NEURONTIN) 800 MG tablet Take 1 tablet (800 mg total) by mouth 3 (three) times daily.  ? levothyroxine (SYNTHROID) 75 MCG tablet TAKE 1 TABLET (75 MCG TOTAL) BY MOUTH DAILY.  ? Lutein 20 MG TABS Take 1 tablet by mouth daily.  ? meloxicam (MOBIC) 15 MG tablet Take 1 tablet (15 mg total) by mouth daily.  ? Multiple Vitamin (MULTIVITAMIN WITH MINERALS) TABS tablet Take 1 tablet by mouth daily.  ? ondansetron (ZOFRAN) 4 MG tablet Take 1 tablet (4 mg total) by mouth every 4 (four) hours as needed for nausea or vomiting.  ? OXYGEN 2lpm 2 lpm as  needed  ? sacubitril-valsartan (ENTRESTO) 24-26 MG Take 1 tablet by mouth 2 (two) times daily.  ? sucralfate (CARAFATE) 1 g tablet Take 1 tablet (1 g total) by mouth 4 (four) times daily -  with meals and at bedtime for 7 days.  ? Tiotropium Bromide-Olodaterol (STIOLTO RESPIMAT) 2.5-2.5 MCG/ACT AERS INHALE 2 PUFFS INTO THE LUNGS DAILY.  ? tiZANidine (ZANAFLEX) 4 MG tablet TAKE 1 TABLET EVERY 6 HOURS AS NEEDED FOR MUSCLE SPASM(S)  ? traMADol (ULTRAM) 50 MG tablet Take 1 tablet (50 mg total) by mouth every 6 (six) hours as needed for severe pain. (Patient taking differently: Take 50 mg by mouth. Take one four times a day as needed for pain)  ? UNABLE TO FIND CPAP with o2 2lpm  ?DME- [facility]  ? VASCEPA 1 g capsule TAKE 2 CAPSULES BY MOUTH 2 TIMES DAILY.  ? Vitamin D, Ergocalciferol, (DRISDOL) 1.25 MG (50000 UNIT) CAPS capsule TAKE 1 CAPSULE EVERY 7 DAYS  ? ?No facility-administered encounter medications on file as of 04/19/2021.  ? ? ? ?Recent Office Vitals: ?BP Readings from Last 3 Encounters:  ?02/27/21 114/69  ?02/26/21 116/74  ?01/26/21 118/72  ? ?Pulse Readings from Last 3 Encounters:  ?02/27/21 98  ?02/26/21 78  ?01/26/21 73  ?  ?Wt Readings from Last 3 Encounters:  ?02/27/21  272 lb (123.4 kg)  ?02/26/21 272 lb (123.4 kg)  ?01/26/21 279 lb (126.6 kg)  ?  ? ?Kidney Function ?Lab Results  ?Component Value Date/Time  ? CREATININE 0.86 02/27/2021 08:51 AM  ? CREATININE 0.80 01/15/2021 09:48 AM  ? CREATININE 0.75 12/09/2015 10:57 AM  ? CREATININE 0.69 10/23/2015 02:37 PM  ? GFR 100.87 10/01/2014 10:54 AM  ? GFRNONAA >60 02/27/2021 08:51 AM  ? GFRAA 87 04/06/2020 02:50 PM  ? ? ?BMP Latest Ref Rng & Units 02/27/2021 01/15/2021 10/13/2020  ?Glucose 70 - 99 mg/dL 150(H) 110(H) 88  ?BUN 6 - 20 mg/dL 13 14 12   ?Creatinine 0.44 - 1.00 mg/dL 0.86 0.80 0.81  ?BUN/Creat Ratio 12 - 28 - 18 15  ?Sodium 135 - 145 mmol/L 141 143 145(H)  ?Potassium 3.5 - 5.1 mmol/L 4.1 4.8 4.6  ?Chloride 98 - 111 mmol/L 105 104 107(H)  ?CO2 22 - 32 mmol/L 25  25 24   ?Calcium 8.9 - 10.3 mg/dL 9.0 9.8 9.4  ? ? ? ?Current antihypertensive regimen:  ?Carvedilol 6.25mg   ?Patient verbally confirms she is taking the above medications as directed. Yes ? ?How often are [name] checking your Blood Pressure? infrequently, only checks when something feels off ? ?Wrist or arm cuff:Arm  ?Caffeine intake:1 cup of tea and 1 soda  ?Salt intake:Limited  ?OTC medications including pseudoephedrine or NSAIDs? None ?  ?Any readings above 180/120? No ? ?What recent interventions/DTPs have been made by any provider to improve Blood Pressure control since last CPP Visit: No changes  ? ?Any recent hospitalizations or ED visits since last visit with CPP? No ? ?What diet changes have been made to improve Blood Pressure Control?  ?Pt denies any changes  ? ?What exercise is being done to improve your Blood Pressure Control?  ?Pt walks daily with an up walker  ? ?Adherence Review: ?Is the patient currently on ACE/ARB medication? Yes ?Does the patient have >5 day gap between last estimated fill dates? CPP to review ? ?Care Gaps: ?Last annual wellness visit?01/26/21 ? ?Star Rating Drugs:  ?Medication:  Last Fill: Day Supply  ?None noted  ? ?Judy [name], CMA ?Clinical Pharmacist Assistant  ?[phone]  ?

## 2021-04-20 ENCOUNTER — Other Ambulatory Visit: Payer: Self-pay

## 2021-04-20 ENCOUNTER — Other Ambulatory Visit: Payer: Medicare HMO

## 2021-04-20 DIAGNOSIS — E1169 Type 2 diabetes mellitus with other specified complication: Secondary | ICD-10-CM

## 2021-04-20 DIAGNOSIS — E785 Hyperlipidemia, unspecified: Secondary | ICD-10-CM | POA: Diagnosis not present

## 2021-04-21 LAB — LIPID PANEL
Chol/HDL Ratio: 3.6 ratio (ref 0.0–4.4)
Cholesterol, Total: 132 mg/dL (ref 100–199)
HDL: 37 mg/dL — ABNORMAL LOW (ref >39)
LDL Chol Calc (NIH): 67 mg/dL (ref 0–99)
Triglycerides: 163 mg/dL — ABNORMAL HIGH (ref 0–149)
VLDL Cholesterol Cal: 28 mg/dL (ref 5–40)

## 2021-04-21 LAB — COMPREHENSIVE METABOLIC PANEL
ALT: 29 IU/L (ref 0–32)
AST: 19 IU/L (ref 0–40)
Albumin/Globulin Ratio: 2.3 — ABNORMAL HIGH (ref 1.2–2.2)
Albumin: 4.1 g/dL (ref 3.8–4.9)
Alkaline Phosphatase: 158 IU/L — ABNORMAL HIGH (ref 44–121)
BUN/Creatinine Ratio: 18 (ref 12–28)
BUN: 17 mg/dL (ref 8–27)
Bilirubin Total: 1 mg/dL (ref 0.0–1.2)
CO2: 24 mmol/L (ref 20–29)
Calcium: 9.1 mg/dL (ref 8.7–10.3)
Chloride: 108 mmol/L — ABNORMAL HIGH (ref 96–106)
Creatinine, Ser: 0.93 mg/dL (ref 0.57–1.00)
Globulin, Total: 1.8 g/dL (ref 1.5–4.5)
Glucose: 86 mg/dL (ref 70–99)
Potassium: 4.5 mmol/L (ref 3.5–5.2)
Sodium: 144 mmol/L (ref 134–144)
Total Protein: 5.9 g/dL — ABNORMAL LOW (ref 6.0–8.5)
eGFR: 70 mL/min/{1.73_m2} (ref >59)

## 2021-04-21 LAB — CBC WITH DIFFERENTIAL/PLATELET
Basophils Absolute: 0.1 10*3/uL (ref 0.0–0.2)
Basos: 1 %
EOS (ABSOLUTE): 0.4 10*3/uL (ref 0.0–0.4)
Eos: 6 %
Hematocrit: 42.7 % (ref 34.0–46.6)
Hemoglobin: 14.4 g/dL (ref 11.1–15.9)
Immature Grans (Abs): 0 10*3/uL (ref 0.0–0.1)
Immature Granulocytes: 0 %
Lymphocytes Absolute: 1.7 10*3/uL (ref 0.7–3.1)
Lymphs: 27 %
MCH: 31.1 pg (ref 26.6–33.0)
MCHC: 33.7 g/dL (ref 31.5–35.7)
MCV: 92 fL (ref 79–97)
Monocytes Absolute: 0.5 10*3/uL (ref 0.1–0.9)
Monocytes: 9 %
Neutrophils Absolute: 3.7 10*3/uL (ref 1.4–7.0)
Neutrophils: 57 %
Platelets: 235 10*3/uL (ref 150–450)
RBC: 4.63 x10E6/uL (ref 3.77–5.28)
RDW: 12.5 % (ref 11.7–15.4)
WBC: 6.3 10*3/uL (ref 3.4–10.8)

## 2021-04-21 LAB — HEMOGLOBIN A1C
Est. average glucose Bld gHb Est-mCnc: 120 mg/dL
Hgb A1c MFr Bld: 5.8 % — ABNORMAL HIGH (ref 4.8–5.6)

## 2021-04-21 LAB — CARDIOVASCULAR RISK ASSESSMENT

## 2021-04-21 NOTE — Progress Notes (Signed)
Blood count normal.  ?Liver function normal.  ?Kidney function normal.  ?Thyroid function normal.  ?Cholesterol: triglycerides great improved. Down from 214 to 163! LDL good.  ?HBA1C: 5.8! Diabetes is awesome! ?

## 2021-04-23 ENCOUNTER — Ambulatory Visit (INDEPENDENT_AMBULATORY_CARE_PROVIDER_SITE_OTHER): Payer: Medicare HMO | Admitting: Family Medicine

## 2021-04-23 ENCOUNTER — Other Ambulatory Visit: Payer: Self-pay

## 2021-04-23 VITALS — BP 110/70 | HR 92 | Temp 97.0°F | Resp 18 | Ht 65.0 in | Wt 274.0 lb

## 2021-04-23 DIAGNOSIS — E1169 Type 2 diabetes mellitus with other specified complication: Secondary | ICD-10-CM | POA: Diagnosis not present

## 2021-04-23 DIAGNOSIS — E038 Other specified hypothyroidism: Secondary | ICD-10-CM

## 2021-04-23 DIAGNOSIS — G4733 Obstructive sleep apnea (adult) (pediatric): Secondary | ICD-10-CM

## 2021-04-23 DIAGNOSIS — F411 Generalized anxiety disorder: Secondary | ICD-10-CM

## 2021-04-23 DIAGNOSIS — E1159 Type 2 diabetes mellitus with other circulatory complications: Secondary | ICD-10-CM

## 2021-04-23 DIAGNOSIS — M5412 Radiculopathy, cervical region: Secondary | ICD-10-CM

## 2021-04-23 DIAGNOSIS — I110 Hypertensive heart disease with heart failure: Secondary | ICD-10-CM | POA: Diagnosis not present

## 2021-04-23 DIAGNOSIS — M65321 Trigger finger, right index finger: Secondary | ICD-10-CM

## 2021-04-23 DIAGNOSIS — Z1231 Encounter for screening mammogram for malignant neoplasm of breast: Secondary | ICD-10-CM

## 2021-04-23 DIAGNOSIS — E785 Hyperlipidemia, unspecified: Secondary | ICD-10-CM

## 2021-04-23 DIAGNOSIS — I5042 Chronic combined systolic (congestive) and diastolic (congestive) heart failure: Secondary | ICD-10-CM

## 2021-04-23 DIAGNOSIS — M4030 Flatback syndrome, site unspecified: Secondary | ICD-10-CM | POA: Diagnosis not present

## 2021-04-23 DIAGNOSIS — G5621 Lesion of ulnar nerve, right upper limb: Secondary | ICD-10-CM

## 2021-04-23 DIAGNOSIS — M25511 Pain in right shoulder: Secondary | ICD-10-CM

## 2021-04-23 DIAGNOSIS — J9611 Chronic respiratory failure with hypoxia: Secondary | ICD-10-CM | POA: Diagnosis not present

## 2021-04-23 DIAGNOSIS — F419 Anxiety disorder, unspecified: Secondary | ICD-10-CM

## 2021-04-23 DIAGNOSIS — G8929 Other chronic pain: Secondary | ICD-10-CM

## 2021-04-23 DIAGNOSIS — I152 Hypertension secondary to endocrine disorders: Secondary | ICD-10-CM

## 2021-04-23 DIAGNOSIS — E782 Mixed hyperlipidemia: Secondary | ICD-10-CM

## 2021-04-23 MED ORDER — TRIAMCINOLONE ACETONIDE 40 MG/ML IJ SUSP
20.0000 mg | Freq: Once | INTRAMUSCULAR | Status: AC
  Administered 2021-04-23: 20 mg via INTRA_ARTICULAR

## 2021-04-23 NOTE — Progress Notes (Unsigned)
Subjective:  Patient ID: Judy [name], female    DOB: 12/18/1960  Age: 61 y.o. MRN: [id number]  Chief Complaint  Patient presents with   Diabetes   Hyperlipidemia   Hypertension    Diabetes:  Complications: Glucose checking: Glucose logs: Hypoglycemia: Most recent A1C: Current medications:  currently taking Farxiga 10 mg daily, Trulicity Inject 3 mg into skin weekly Last Eye Exam: Foot checks: Checks feet daily  Hyperlipidemia: Current medications: atorvastatin 20 mg 1 tablet daily, Vascepa 2 g BID, Coq10  Hypertensive CHF secondary to NICM: Complications: Current medications: Patient is taking aspirin 81 mg daily, carvedilol 6.25 mg BID, Entresto 24-26 mg 1 tablet BID. Pt sees Dr. Armanda Magic. NYHA class 2B. EF has significantly improved with entresto from 35% in 08/2014 to 55% in 06/2017. LHC done in 2016 was normal.  Hypothyroidism:  Patient is currently taking Levothyroxine 75 mcg take 1 tablet daily.  Generalized Anxiety disorder: Patient is currently taking Wellbutrin 300 mg daily, Clonazepam 0.5 mg  BID PRN. Patient denies depression.  COPD/Chronic respiratory failure with hypoxia: Pt sees Dr. Sherene Sires. Currently on stiolto. ON nocturnal oxygen 2 L. Does not require during the day, but has DOE at baseline.   Diet: Exercise:    Current Outpatient Medications on File Prior to Visit  Medication Sig Dispense Refill   albuterol (VENTOLIN HFA) 108 (90 Base) MCG/ACT inhaler Inhale 2 puffs into the lungs every 6 (six) hours as needed for wheezing or shortness of breath. 8 g 2   aspirin 81 MG tablet Take 81 mg by mouth at bedtime.      atorvastatin (LIPITOR) 20 MG tablet Take 0.5 tablets (10 mg total) by mouth daily. Take half tablet daily (10 mg total daily) 1 tablet 0   buPROPion (WELLBUTRIN XL) 300 MG 24 hr tablet TAKE 1 TABLET EVERY DAY 90 tablet 3   carvedilol (COREG) 6.25 MG tablet Take 1 tablet (6.25 mg total) by mouth 2 (two) times daily. 180 tablet 3   clonazePAM  (KLONOPIN) 0.5 MG tablet Take 1 tablet (0.5 mg total) by mouth 2 (two) times daily as needed for anxiety (panic attacks). 180 tablet 0   Coenzyme Q10 (CO Q 10) 100 MG CAPS Take 200 mg by mouth daily.     diphenhydrAMINE (BENADRYL) 25 MG tablet Take 50 mg by mouth at bedtime as needed.     Dulaglutide (TRULICITY) 3 MG/0.5ML SOPN Inject 3 mg as directed once a week. 2 mL 0   FARXIGA 10 MG TABS tablet Take 1 tablet (10 mg total) by mouth daily. 90 tablet 3   fenofibrate 160 MG tablet Take 160 mg by mouth daily. (Patient not taking: Reported on 02/26/2021)     gabapentin (NEURONTIN) 800 MG tablet Take 1 tablet (800 mg total) by mouth 3 (three) times daily. 270 tablet 1   levothyroxine (SYNTHROID) 75 MCG tablet TAKE 1 TABLET (75 MCG TOTAL) BY MOUTH DAILY. 90 tablet 1   Lutein 20 MG TABS Take 1 tablet by mouth daily.     meloxicam (MOBIC) 15 MG tablet Take 1 tablet (15 mg total) by mouth daily. 90 tablet 3   Multiple Vitamin (MULTIVITAMIN WITH MINERALS) TABS tablet Take 1 tablet by mouth daily.     ondansetron (ZOFRAN) 4 MG tablet Take 1 tablet (4 mg total) by mouth every 4 (four) hours as needed for nausea or vomiting. 12 tablet 0   OXYGEN 2lpm 2 lpm as needed     sacubitril-valsartan (ENTRESTO) 24-26 MG Take 1  tablet by mouth 2 (two) times daily. 180 tablet 3   sucralfate (CARAFATE) 1 g tablet Take 1 tablet (1 g total) by mouth 4 (four) times daily -  with meals and at bedtime for 7 days. 28 tablet 0   Tiotropium Bromide-Olodaterol (STIOLTO RESPIMAT) 2.5-2.5 MCG/ACT AERS INHALE 2 PUFFS INTO THE LUNGS DAILY. 12 g 5   tiZANidine (ZANAFLEX) 4 MG tablet TAKE 1 TABLET EVERY 6 HOURS AS NEEDED FOR MUSCLE SPASM(S) 360 tablet 1   traMADol (ULTRAM) 50 MG tablet Take 1 tablet (50 mg total) by mouth every 6 (six) hours as needed for severe pain. (Patient taking differently: Take 50 mg by mouth. Take one four times a day as needed for pain) 120 tablet 5   UNABLE TO FIND CPAP with o2 2lpm  DME- [facility]     VASCEPA 1 g  capsule TAKE 2 CAPSULES BY MOUTH 2 TIMES DAILY. 360 capsule 1   Vitamin D, Ergocalciferol, (DRISDOL) 1.25 MG (50000 UNIT) CAPS capsule TAKE 1 CAPSULE EVERY 7 DAYS 12 capsule 3   No current facility-administered medications on file prior to visit.   Past Medical History:  Diagnosis Date   Anxiety    Back pain    Bilateral swelling of feet    Chronic combined systolic and diastolic CHF (congestive heart failure) (HCC) 10/07/2014   COPD (chronic obstructive pulmonary disease) (HCC)    DCM (dilated cardiomyopathy) (HCC) 04/25/2014   EF 28% by MRI despite maximum medical therapy   Depression    Diabetes mellitus without complication (HCC)    Epilepsy (HCC)    as a child   Fatty liver    Former tobacco use    Hyperlipidemia    Hypertension    Hypothyroidism    Joint pain    Leg swelling    Morbid obesity (HCC)    NICM (nonischemic cardiomyopathy) (HCC)    OSA (obstructive sleep apnea)    moderate with AHI 26/hr with oxygen desaturations as low as 70%   Scoliosis    Sleep apnea    SOB (shortness of breath)    Past Surgical History:  Procedure Laterality Date   ABDOMINAL HYSTERECTOMY     ankle artery involved cyst removal     CARDIAC CATHETERIZATION  05/14/2014   normal coronary arteries   CARPAL TUNNEL RELEASE     FASCIOTOMY     1993 for platar fasciitis   harrington rod scoliosis     1981   LEFT HEART CATHETERIZATION WITH CORONARY ANGIOGRAM N/A 05/14/2014   Procedure: LEFT HEART CATHETERIZATION WITH CORONARY ANGIOGRAM;  Surgeon: Iran Ouch, MD;  Location: [facility] CATH LAB;  Service: Cardiovascular;  Laterality: N/A;   ROTATOR CUFF REPAIR     UMBILICAL HERNIA REPAIR     VESICOVAGINAL FISTULA CLOSURE W/ TAH      Family History  Problem Relation Age of Onset   Emphysema Father    Allergies Father    Heart failure Father    Heart disease Father 86       MI   Diabetes Father    Hyperlipidemia Father    Hypertension Father    Thyroid disease Father    Alcohol abuse  Father    Obesity Father    Diabetes Mother    Hyperlipidemia Mother    Obesity Mother    Social History   Socioeconomic History   Marital status: Married    Spouse name: Not on file   Number of children: Not on file   Years  of education: Not on file   Highest education level: Not on file  Occupational History   Occupation: disabled  Tobacco Use   Smoking status: Former    Packs/day: 1.00    Years: 15.00    Pack years: 15.00    Types: Cigarettes    Quit date: 02/14/2005    Years since quitting: 16.1   Smokeless tobacco: Never  Vaping Use   Vaping Use: Never used  Substance and Sexual Activity   Alcohol use: Yes    Comment: occ   Drug use: No   Sexual activity: Not on file  Other Topics Concern   Not on file  Social History Narrative   Lives in [place] with spouse and son.   Previously worked as a Comptroller         Social Determinants of Corporate investment banker Strain: High Risk   Difficulty of Paying Living Expenses: Hard  Food Insecurity: No Food Insecurity   Worried About Programme researcher, broadcasting/film/video in the Last Year: Never true   Barista in the Last Year: Never true  Transportation Needs: No Transportation Needs   Lack of Transportation (Medical): No   Lack of Transportation (Non-Medical): No  Physical Activity: Inactive   Days of Exercise per Week: 0 days   Minutes of Exercise per Session: 0 min  Stress: Not on file  Social Connections: Not on file    Review of Systems  Constitutional:  Negative for appetite change, fatigue and fever.  HENT:  Positive for rhinorrhea. Negative for congestion, ear pain, sinus pressure and sore throat.   Eyes:  Negative for pain.  Respiratory:  Positive for shortness of breath. Negative for cough, chest tightness and wheezing.   Cardiovascular:  Negative for chest pain and palpitations.  Gastrointestinal:  Negative for abdominal pain, constipation, diarrhea, nausea and vomiting.  Endocrine:  Negative for polydipsia and polyphagia.  Genitourinary:  Negative for dysuria and hematuria.  Musculoskeletal:  Positive for arthralgias (right shoulder pain) and back pain. Negative for joint swelling and myalgias.  Skin:  Negative for rash.  Neurological:  Negative for dizziness, weakness and headaches.       Balance issues  Psychiatric/Behavioral:  Negative for dysphoric mood. The patient is not nervous/anxious.     Objective:  There were no vitals taken for this visit.  BP/Weight 02/27/2021 02/26/2021 01/26/2021  Systolic BP 114 116 118  Diastolic BP 69 74 72  Wt. (Lbs) 272 272 279  BMI 45.26 45.26 46.43    Physical Exam Vitals reviewed.  Constitutional:      Appearance: Normal appearance. She is normal weight.  Neck:     Vascular: No carotid bruit.  Cardiovascular:     Rate and Rhythm: Normal rate and regular rhythm.     Heart sounds: Normal heart sounds.  Pulmonary:     Effort: Pulmonary effort is normal. No respiratory distress.     Breath sounds: Normal breath sounds.  Abdominal:     General: Abdomen is flat. Bowel sounds are normal.     Palpations: Abdomen is soft.     Tenderness: There is no abdominal tenderness.  Neurological:     Mental Status: She is alert and oriented to person, place, and time.  Psychiatric:        Mood and Affect: Mood normal.        Behavior: Behavior normal.    Diabetic Foot Exam - Simple   No data filed  Lab Results  Component Value Date   WBC 6.3 04/20/2021   HGB 14.4 04/20/2021   HCT 42.7 04/20/2021   PLT 235 04/20/2021   GLUCOSE 86 04/20/2021   CHOL 132 04/20/2021   TRIG 163 (H) 04/20/2021   HDL 37 (L) 04/20/2021   LDLCALC 67 04/20/2021   ALT 29 04/20/2021   AST 19 04/20/2021   NA 144 04/20/2021   K 4.5 04/20/2021   CL 108 (H) 04/20/2021   CREATININE 0.93 04/20/2021   BUN 17 04/20/2021   CO2 24 04/20/2021   TSH 3.070 04/06/2020   INR 1.0 05/13/2014   HGBA1C 5.8 (H) 04/20/2021   MICROALBUR Negative 09/03/2019       Assessment & Plan:   Problem List Items Addressed This Visit       Cardiovascular and Mediastinum   Hypertension associated with diabetes (HCC)   Hypertensive heart disease with CHF (congestive heart failure) (HCC)     Respiratory   Chronic respiratory failure with hypoxia (HCC)   OSA (obstructive sleep apnea)     Endocrine   Other specified hypothyroidism     Nervous and Auditory   Cervical radiculopathy - Primary     Musculoskeletal and Integument   Flatback syndrome     Other   Morbid obesity due to excess calories (HCC)   Mixed hyperlipidemia   Other Visit Diagnoses     Hyperlipidemia associated with type 2 diabetes mellitus (HCC)         .  No orders of the defined types were placed in this encounter.   No orders of the defined types were placed in this encounter.    Follow-up: No follow-ups on file.  An After Visit Summary was printed and given to the patient.  Blane Ohara, MD [facility] [phone]

## 2021-04-24 ENCOUNTER — Encounter: Payer: Self-pay | Admitting: Cardiology

## 2021-04-27 ENCOUNTER — Telehealth: Payer: Medicare HMO

## 2021-04-29 ENCOUNTER — Other Ambulatory Visit: Payer: Self-pay | Admitting: Family Medicine

## 2021-05-03 ENCOUNTER — Ambulatory Visit (INDEPENDENT_AMBULATORY_CARE_PROVIDER_SITE_OTHER): Payer: Medicare HMO | Admitting: Nurse Practitioner

## 2021-05-03 ENCOUNTER — Encounter: Payer: Self-pay | Admitting: Nurse Practitioner

## 2021-05-03 VITALS — BP 136/78 | HR 95 | Temp 97.3°F | Ht 65.0 in | Wt 270.0 lb

## 2021-05-03 DIAGNOSIS — R1033 Periumbilical pain: Secondary | ICD-10-CM

## 2021-05-03 DIAGNOSIS — R112 Nausea with vomiting, unspecified: Secondary | ICD-10-CM | POA: Diagnosis not present

## 2021-05-03 DIAGNOSIS — J3089 Other allergic rhinitis: Secondary | ICD-10-CM | POA: Diagnosis not present

## 2021-05-03 DIAGNOSIS — K529 Noninfective gastroenteritis and colitis, unspecified: Secondary | ICD-10-CM | POA: Diagnosis not present

## 2021-05-03 LAB — POCT URINALYSIS DIP (CLINITEK)
Blood, UA: NEGATIVE
Glucose, UA: >=1000 mg/dL — AB
Ketones, POC UA: NEGATIVE mg/dL
Leukocytes, UA: NEGATIVE
Nitrite, UA: NEGATIVE
POC PROTEIN,UA: NEGATIVE
Spec Grav, UA: 1.025 (ref 1.010–1.025)
Urobilinogen, UA: 0.2 E.U./dL
pH, UA: 5 (ref 5.0–8.0)

## 2021-05-03 MED ORDER — FLUTICASONE PROPIONATE 50 MCG/ACT NA SUSP: 2.0000 | Freq: Every day | NASAL | 6 refills | Status: DC

## 2021-05-03 MED ORDER — ONDANSETRON 4 MG PO TBDP: 4.0000 mg | ORAL_TABLET | Freq: Three times a day (TID) | ORAL | 0 refills | Status: DC | PRN

## 2021-05-03 NOTE — Progress Notes (Signed)
? ?Acute Office Visit ? ?Subjective:  ? ? Patient ID: Judy [name], female    DOB: 1961/01/17, 61 y.o.   MRN: [id number] ? ?CC: ?Abdominal pain ? ? ?HPI: ?Patient is in today for Abdominal Pain ? ?She reports new onset abdominal pain, nausea, and vomiting. The most recent episode started this morning and is improving. Treatment has included a heating pad with some relief. The abdominal pain is located in the periumbilical area with radiation to the back. It is described as burning and sharp, is 5/10 in intensity, occurring intermittently. ? ?Associated symptoms: ?No anorexia  No belching  ?No bloody stool No blood in urine   ?No constipation No diarrhea  ?No dysuria No fever  ?No flatus No headaches  ?No headaches No joint pains  ?No myalgias Yes nausea  ?Yes vomiting No weight loss  ? ?  ?Recent GI studies: none ? ?Previous labs ?Lab Results  ?Component Value Date  ? WBC 6.3 04/20/2021  ? HGB 14.4 04/20/2021  ? HCT 42.7 04/20/2021  ? MCV 92 04/20/2021  ? MCH 31.1 04/20/2021  ? RDW 12.5 04/20/2021  ? PLT 235 04/20/2021  ? ?Lab Results  ?Component Value Date  ? GLUCOSE 86 04/20/2021  ? NA 144 04/20/2021  ? K 4.5 04/20/2021  ? CL 108 (H) 04/20/2021  ? CO2 24 04/20/2021  ? BUN 17 04/20/2021  ? CREATININE 0.93 04/20/2021  ? GFRNONAA >60 02/27/2021  ? GFRAA 87 04/06/2020  ? CALCIUM 9.1 04/20/2021  ? PROT 5.9 (L) 04/20/2021  ? ALBUMIN 4.1 04/20/2021  ? LABGLOB 1.8 04/20/2021  ? AGRATIO 2.3 (H) 04/20/2021  ? BILITOT 1.0 04/20/2021  ? ALKPHOS 158 (H) 04/20/2021  ? AST 19 04/20/2021  ? ALT 29 04/20/2021  ? ANIONGAP 11 02/27/2021  ? ? ? ?Past Medical History:  ?Diagnosis Date  ? Anxiety   ? Back pain   ? Bilateral swelling of feet   ? Chronic combined systolic and diastolic CHF (congestive heart failure) ([place]) 10/07/2014  ? COPD (chronic obstructive pulmonary disease) ([place])   ? DCM (dilated cardiomyopathy) ([place]) 04/25/2014  ? EF 28% by MRI despite maximum medical therapy  ? Depression   ? Diabetes mellitus without  complication ([place])   ? Epilepsy ([place])   ? as a child  ? Fatty liver   ? Former tobacco use   ? Hyperlipidemia   ? Hypertension   ? Hypothyroidism   ? Joint pain   ? Leg swelling   ? Morbid obesity ([place])   ? NICM (nonischemic cardiomyopathy) ([place])   ? OSA (obstructive sleep apnea)   ? moderate with AHI 26/hr with oxygen desaturations as low as 70%  ? Scoliosis   ? Sleep apnea   ? SOB (shortness of breath)   ? ? ?Past Surgical History:  ?Procedure Laterality Date  ? ABDOMINAL HYSTERECTOMY    ? ankle artery involved cyst removal    ? CARDIAC CATHETERIZATION  05/14/2014  ? normal coronary arteries  ? CARPAL TUNNEL RELEASE    ? FASCIOTOMY    ? 1993 for platar fasciitis  ? harrington rod scoliosis    ? 1981  ? LEFT HEART CATHETERIZATION WITH CORONARY ANGIOGRAM N/A 05/14/2014  ? Procedure: LEFT HEART CATHETERIZATION WITH CORONARY ANGIOGRAM;  Surgeon: Wellington Hampshire, MD;  Location: [facility];  Service: Cardiovascular;  Laterality: N/A;  ? ROTATOR CUFF REPAIR    ? UMBILICAL HERNIA REPAIR    ? VESICOVAGINAL FISTULA CLOSURE W/ TAH    ? ? ?  Family History  ?Problem Relation Age of Onset  ? Emphysema Father   ? Allergies Father   ? Heart failure Father   ? Heart disease Father 33  ?     MI  ? Diabetes Father   ? Hyperlipidemia Father   ? Hypertension Father   ? Thyroid disease Father   ? Alcohol abuse Father   ? Obesity Father   ? Diabetes Mother   ? Hyperlipidemia Mother   ? Obesity Mother   ? ? ?Social History  ? ?Socioeconomic History  ? Marital status: Married  ?  Spouse name: Not on file  ? Number of children: Not on file  ? Years of education: Not on file  ? Highest education level: Not on file  ?Occupational History  ? Occupation: disabled  ?Tobacco Use  ? Smoking status: Former  ?  Packs/day: 1.00  ?  Years: 15.00  ?  Pack years: 15.00  ?  Types: Cigarettes  ?  Quit date: 02/14/2005  ?  Years since quitting: 16.2  ? Smokeless tobacco: Never  ?Vaping Use  ? Vaping Use: Never used  ?Substance and Sexual Activity  ? Alcohol  use: Yes  ?  Comment: occ  ? Drug use: No  ? Sexual activity: Not on file  ?Other Topics Concern  ? Not on file  ?Social History Narrative  ? Lives in [place] with spouse and son.  ? Previously worked as a Engineer, manufacturing  ?   ?   ? ?Social Determinants of Health  ? ?Financial Resource Strain: High Risk  ? Difficulty of Paying Living Expenses: Hard  ?Food Insecurity: No Food Insecurity  ? Worried About Charity fundraiser in the Last Year: Never true  ? Ran Out of Food in the Last Year: Never true  ?Transportation Needs: No Transportation Needs  ? Lack of Transportation (Medical): No  ? Lack of Transportation (Non-Medical): No  ?Physical Activity: Inactive  ? Days of Exercise per Week: 0 days  ? Minutes of Exercise per Session: 0 min  ?Stress: Not on file  ?Social Connections: Not on file  ?Intimate Partner Violence: Not At Risk  ? Fear of Current or Ex-Partner: No  ? Emotionally Abused: No  ? Physically Abused: No  ? Sexually Abused: No  ? ? ?Outpatient Medications Prior to Visit  ?Medication Sig Dispense Refill  ? albuterol (VENTOLIN HFA) 108 (90 Base) MCG/ACT inhaler Inhale 2 puffs into the lungs every 6 (six) hours as needed for wheezing or shortness of breath. 8 g 2  ? aspirin 81 MG tablet Take 81 mg by mouth at bedtime.     ? atorvastatin (LIPITOR) 20 MG tablet Take 0.5 tablets (10 mg total) by mouth daily. Take half tablet daily (10 mg total daily) 1 tablet 0  ? buPROPion (WELLBUTRIN XL) 300 MG 24 hr tablet TAKE 1 TABLET EVERY DAY 90 tablet 3  ? carvedilol (COREG) 6.25 MG tablet Take 1 tablet (6.25 mg total) by mouth 2 (two) times daily. 180 tablet 3  ? clonazePAM (KLONOPIN) 0.5 MG tablet Take 1 tablet (0.5 mg total) by mouth 2 (two) times daily as needed for anxiety (panic attacks). 180 tablet 0  ? Coenzyme Q10 (CO Q 10) 100 MG CAPS Take 200 mg by mouth daily.    ? diphenhydrAMINE (BENADRYL) 25 MG tablet Take 50 mg by mouth at bedtime as needed.    ? Dulaglutide (TRULICITY) 3 0000000 SOPN  Inject 3 mg as directed once a  week. 2 mL 0  ? FARXIGA 10 MG TABS tablet Take 1 tablet (10 mg total) by mouth daily. 90 tablet 3  ? fenofibrate 160 MG tablet Take 160 mg by mouth daily.    ? gabapentin (NEURONTIN) 800 MG tablet Take 1 tablet (800 mg total) by mouth 3 (three) times daily. 270 tablet 1  ? levothyroxine (SYNTHROID) 75 MCG tablet TAKE 1 TABLET (75 MCG TOTAL) BY MOUTH DAILY. 90 tablet 1  ? Lutein 20 MG TABS Take 1 tablet by mouth daily.    ? meloxicam (MOBIC) 15 MG tablet Take 1 tablet (15 mg total) by mouth daily. 90 tablet 3  ? Multiple Vitamin (MULTIVITAMIN WITH MINERALS) TABS tablet Take 1 tablet by mouth daily.    ? ondansetron (ZOFRAN) 4 MG tablet Take 1 tablet (4 mg total) by mouth every 4 (four) hours as needed for nausea or vomiting. 12 tablet 0  ? OXYGEN 2lpm 2 lpm as needed    ? sacubitril-valsartan (ENTRESTO) 24-26 MG Take 1 tablet by mouth 2 (two) times daily. 180 tablet 3  ? Tiotropium Bromide-Olodaterol (STIOLTO RESPIMAT) 2.5-2.5 MCG/ACT AERS INHALE 2 PUFFS INTO THE LUNGS DAILY. 12 g 5  ? tiZANidine (ZANAFLEX) 4 MG tablet TAKE 1 TABLET EVERY 6 HOURS AS NEEDED FOR MUSCLE SPASM(S) 360 tablet 1  ? traMADol (ULTRAM) 50 MG tablet Take 1 tablet (50 mg total) by mouth every 6 (six) hours as needed for severe pain. (Patient taking differently: Take 50 mg by mouth. Take one four times a day as needed for pain) 120 tablet 5  ? UNABLE TO FIND CPAP with o2 2lpm  ?DME- [facility]    ? VASCEPA 1 g capsule TAKE 2 CAPSULES BY MOUTH 2 TIMES DAILY. 360 capsule 1  ? Vitamin D, Ergocalciferol, (DRISDOL) 1.25 MG (50000 UNIT) CAPS capsule TAKE 1 CAPSULE EVERY 7 DAYS 12 capsule 3  ? ?No facility-administered medications prior to visit.  ? ? ?Allergies  ?Allergen Reactions  ? Demerol [Meperidine] Nausea And Vomiting  ? ? ?Review of Systems  ?Constitutional:  Positive for chills and fatigue. Negative for fever.  ?HENT:  Negative for congestion and sore throat.   ?Gastrointestinal:  Positive for abdominal pain,  constipation, nausea and vomiting. Negative for anal bleeding, blood in stool, diarrhea and rectal pain.  ?Genitourinary:  Negative for dysuria, flank pain, frequency and urgency.  ?Musculoskeletal:  Positive for back pain

## 2021-05-03 NOTE — Patient Instructions (Addendum)
Rest and push fluids ?Take Zofran 4 mg as needed for nausea and vomiting ?Push fluids: popsicles, jello, broth, water  ?Follow-up if symptoms return ? ? ?Viral Gastroenteritis, Adult ?Viral gastroenteritis is also known as the stomach flu. This condition may affect your stomach, small intestine, and large intestine. It can cause sudden watery diarrhea, fever, and vomiting. This condition is caused by many different viruses. These viruses can be passed from person to person very easily (are contagious). ?Diarrhea and vomiting can make you feel weak and cause you to become dehydrated. You may not be able to keep fluids down. Dehydration can make you tired and thirsty, cause you to have a dry mouth, and decrease how often you urinate. It is important to replace the fluids that you lose from diarrhea and vomiting. ?What are the causes? ?Gastroenteritis is caused by many viruses, including rotavirus and norovirus. Norovirus is the most common cause in adults. You can get sick after being exposed to the viruses from other people. You can also get sick by: ?Eating food, drinking water, or touching a surface contaminated with one of these viruses. ?Sharing utensils or other personal items with an infected person. ?What increases the risk? ?You are more likely to develop this condition if you: ?Have a weak body defense system (immune system). ?Live with one or more children who are younger than 26 years old. ?Live in a nursing home. ?Travel on cruise ships. ?What are the signs or symptoms? ?Symptoms of this condition start suddenly 1-3 days after exposure to a virus. Symptoms may last for a few days or for as long as a week. Common symptoms include watery diarrhea and vomiting. Other symptoms include: ?Fever. ?Headache. ?Fatigue. ?Pain in the abdomen. ?Chills. ?Weakness. ?Nausea. ?Muscle aches. ?Loss of appetite. ?How is this diagnosed? ?This condition is diagnosed with a medical history and physical exam. You may also have a  stool test to check for viruses or other infections. ?How is this treated? ?This condition typically goes away on its own. The focus of treatment is to prevent dehydration and restore lost fluids (rehydration). This condition may be treated with: ?An oral rehydration solution (ORS) to replace important salts and minerals (electrolytes) in your body. Take this if told by your health care provider. This is a drink that is sold at pharmacies and retail stores. ?Medicines to help with your symptoms. ?Probiotic supplements to reduce symptoms of diarrhea. ?Fluids given through an IV, if dehydration is severe. ?Older adults and people with other diseases or a weak immune system are at higher risk for dehydration. ?Follow these instructions at home: ?Eating and drinking ? ?Take an ORS as told by your health care provider. ?Drink clear fluids in small amounts as you are able. Clear fluids include: ?Water. ?Ice chips. ?Diluted fruit juice. ?Low-calorie sports drinks. ?Drink enough fluid to keep your urine pale yellow. ?Eat small amounts of healthy foods every 3-4 hours as you are able. This may include whole grains, fruits, vegetables, lean meats, and yogurt. ?Avoid fluids that contain a lot of sugar or caffeine, such as energy drinks, sports drinks, and soda. ?Avoid spicy or fatty foods. ?Avoid alcohol. ?General instructions ?Wash your hands often, especially after having diarrhea or vomiting. If soap and water are not available, use hand sanitizer. ?Make sure that all people in your household wash their hands well and often. ?Take over-the-counter and prescription medicines only as told by your health care provider. ?Rest at home while you recover. ?Watch your condition for any  changes. ?Take a warm bath to relieve any burning or pain from frequent diarrhea episodes. ?Keep all follow-up visits as told by your health care provider. This is important. ?Contact a health care provider if you: ?Cannot keep fluids down. ?Have  symptoms that get worse. ?Have new symptoms. ?Feel light-headed or dizzy. ?Have muscle cramps. ?Get help right away if you: ?Have chest pain. ?Feel extremely weak or you faint. ?See blood in your vomit. ?Have vomit that looks like coffee grounds. ?Have bloody or black stools or stools that look like tar. ?Have a severe headache, a stiff neck, or both. ?Have a rash. ?Have severe pain, cramping, or bloating in your abdomen. ?Have trouble breathing or you are breathing very quickly. ?Have a fast heartbeat. ?Have skin that feels cold and clammy. ?Feel confused. ?Have pain when you urinate. ?Have signs of dehydration, such as: ?Dark urine, very little urine, or no urine. ?Cracked lips. ?Dry mouth. ?Sunken eyes. ?Sleepiness. ?Weakness. ?Summary ?Viral gastroenteritis is also known as the stomach flu. It can cause sudden watery diarrhea, fever, and vomiting. ?This condition can be passed from person to person very easily (is contagious). ?Take an ORS if told by your health care provider. This is a drink that is sold at pharmacies and retail stores. ?Wash your hands often, especially after having diarrhea or vomiting. If soap and water are not available, use hand sanitizer. ?This information is not intended to replace advice given to you by your health care provider. Make sure you discuss any questions you have with your health care provider. ?Document Revised: 07/20/2018 Document Reviewed: 12/06/2017 ?Elsevier Patient Education ? 2022 [employer] Inc[employer] ?/ ?

## 2021-05-09 DIAGNOSIS — J4520 Mild intermittent asthma, uncomplicated: Secondary | ICD-10-CM | POA: Diagnosis not present

## 2021-05-09 DIAGNOSIS — J449 Chronic obstructive pulmonary disease, unspecified: Secondary | ICD-10-CM | POA: Diagnosis not present

## 2021-05-12 ENCOUNTER — Other Ambulatory Visit: Payer: Self-pay

## 2021-05-12 ENCOUNTER — Ambulatory Visit
Admission: RE | Admit: 2021-05-12 | Discharge: 2021-05-12 | Disposition: A | Discharge status: Home / Self Care | Payer: Medicare HMO | Source: Ambulatory Visit | Attending: Family Medicine | Admitting: Family Medicine

## 2021-05-12 DIAGNOSIS — Z1231 Encounter for screening mammogram for malignant neoplasm of breast: Secondary | ICD-10-CM | POA: Diagnosis not present

## 2021-05-12 DIAGNOSIS — S46811A Strain of other muscles, fascia and tendons at shoulder and upper arm level, right arm, initial encounter: Secondary | ICD-10-CM | POA: Diagnosis not present

## 2021-05-12 DIAGNOSIS — M75101 Unspecified rotator cuff tear or rupture of right shoulder, not specified as traumatic: Secondary | ICD-10-CM | POA: Diagnosis not present

## 2021-05-12 DIAGNOSIS — M25511 Pain in right shoulder: Secondary | ICD-10-CM | POA: Diagnosis not present

## 2021-05-15 ENCOUNTER — Encounter: Payer: Self-pay | Admitting: Family Medicine

## 2021-05-15 DIAGNOSIS — G8929 Other chronic pain: Secondary | ICD-10-CM | POA: Insufficient documentation

## 2021-05-15 DIAGNOSIS — Z23 Encounter for immunization: Secondary | ICD-10-CM | POA: Insufficient documentation

## 2021-05-15 DIAGNOSIS — Z1231 Encounter for screening mammogram for malignant neoplasm of breast: Secondary | ICD-10-CM | POA: Insufficient documentation

## 2021-05-15 DIAGNOSIS — G5621 Lesion of ulnar nerve, right upper limb: Secondary | ICD-10-CM | POA: Insufficient documentation

## 2021-05-15 DIAGNOSIS — M65321 Trigger finger, right index finger: Secondary | ICD-10-CM | POA: Insufficient documentation

## 2021-05-15 NOTE — Assessment & Plan Note (Signed)
Wear nocturnal oxygen at 2 L.  ?

## 2021-05-15 NOTE — Assessment & Plan Note (Signed)
Continue physical therapy (home) ?

## 2021-05-15 NOTE — Assessment & Plan Note (Addendum)
Well controlled.  ?No changes to medicines. Continue aspirin 81 mg daily, carvedilol 6.25 mg BID, Entresto 24-26 mg 1 tablet BID. ?Continue to work on eating a healthy diet and exercise.  ?Labs reviewed.  ? ?

## 2021-05-15 NOTE — Assessment & Plan Note (Signed)
Continue cpap with oxygen °

## 2021-05-15 NOTE — Assessment & Plan Note (Signed)
Continue gabapentin, tramadol, mobic, and tizanidine.  ?

## 2021-05-15 NOTE — Assessment & Plan Note (Signed)
The current medical regimen is effective;  continue present plan and medications. Continue synthroid 75 mcg once daily in am.  

## 2021-05-15 NOTE — Assessment & Plan Note (Signed)
Well controlled. Continue wellbutrin 300 mg in am and clonazepam.  ?

## 2021-05-15 NOTE — Assessment & Plan Note (Signed)
Well controlled.  ?No changes to medicines. Continue aspirin 81 mg daily, carvedilol 6.25 mg BID, Entresto 24-26 mg 1 tablet BID. ?Continue to work on eating a healthy diet and exercise.  ?Labs reviewed.  ? ?

## 2021-05-15 NOTE — Assessment & Plan Note (Addendum)
Defer management to Dr. Samule Dry. Likely will need a shoulder replacement. Patient is postponing. ?

## 2021-05-15 NOTE — Assessment & Plan Note (Signed)
Recommend continue to work on eating healthy diet and exercise.  

## 2021-05-15 NOTE — Assessment & Plan Note (Signed)
Risks were discussed including bleeding, infection, increase in sugars if diabetic, atrophy at site of injection, and increased pain.  After consent was obtained, using sterile technique the trigger finger (right index finger) was prepped with alcohol.  The palm was entered at the base of the trigger finger and kenalog 20 mg and 0.5 ml plain Lidocaine was then injected and the needle withdrawn.  The procedure was well tolerated.   ?It may be more painful for the first 1-2 days.  Watch for fever, or increased swelling or persistent pain in the joint.  ?Call or return to clinic prn if such symptoms occur or there is failure to improve as anticipated. ?

## 2021-05-15 NOTE — Assessment & Plan Note (Signed)
Control: good. ?Recommend check feet daily. ?Recommend annual eye exams. ?Medicines: Continue farxiga and trulicity ?Continue to work on eating a healthy diet and exercise.  ?Labs reviewedd. ?

## 2021-05-20 ENCOUNTER — Telehealth: Payer: Self-pay

## 2021-05-20 ENCOUNTER — Telehealth: Payer: Self-pay | Admitting: *Deleted

## 2021-05-20 NOTE — Telephone Encounter (Signed)
? ?  Patient Name: Judy [name]  ?DOB: 1960/09/14 ?MRN: [id number] ? ?Primary Cardiologist: Armanda Magic, MD ? ?Chart reviewed as part of pre-operative protocol coverage.  ? ?Preoperative team, please contact this patient and set up a phone call appointment for further cardiac evaluation.   ? ?Thank you for your help. ? ?Of note, patient takes aspirin 81 mg daily, however, she does not have a history of significant CAD and it appears this was not prescribed by her cardiologist. Therefore, there are no contraindications to holding aspirin prior to surgery from a cardiac standpoint. ? ?Joylene Grapes, NP ?05/20/2021, 4:34 PM ? ? ?

## 2021-05-20 NOTE — Telephone Encounter (Signed)
Pt agreeable to plan of care for tele pre op appt 05/24/21 @ 9 am. Med rec and consent are done.  ? ?  ?Patient Consent for Virtual Visit  ? ? ?   ? ?Judy [name] has provided verbal consent on 05/20/2021 for a virtual visit (video or telephone). ? ? ?CONSENT FOR VIRTUAL VISIT FOR:  Judy [name]  ?By participating in this virtual visit I agree to the following: ? ?I hereby voluntarily request, consent and authorize [facility] and its employed or contracted physicians, physician assistants, nurse practitioners or other licensed health care professionals (the Practitioner), to provide me with telemedicine health care services (the ?Services") as deemed necessary by the treating Practitioner. I acknowledge and consent to receive the Services by the Practitioner via telemedicine. I understand that the telemedicine visit will involve communicating with the Practitioner through live audiovisual communication technology and the disclosure of certain medical information by electronic transmission. I acknowledge that I have been given the opportunity to request an in-person assessment or other available alternative prior to the telemedicine visit and am voluntarily participating in the telemedicine visit. ? ?I understand that I have the right to withhold or withdraw my consent to the use of telemedicine in the course of my care at any time, without affecting my right to future care or treatment, and that the Practitioner or I may terminate the telemedicine visit at any time. I understand that I have the right to inspect all information obtained and/or recorded in the course of the telemedicine visit and may receive copies of available information for a reasonable fee.  I understand that some of the potential risks of receiving the Services via telemedicine include:  ?Delay or interruption in medical evaluation due to technological equipment failure or disruption; ?Information transmitted may not be sufficient (e.g.  poor resolution of images) to allow for appropriate medical decision making by the Practitioner; and/or  ?In rare instances, security protocols could fail, causing a breach of personal health information. ? ?Furthermore, I acknowledge that it is my responsibility to provide information about my medical history, conditions and care that is complete and accurate to the best of my ability. I acknowledge that Practitioner's advice, recommendations, and/or decision may be based on factors not within their control, such as incomplete or inaccurate data provided by me or distortions of diagnostic images or specimens that may result from electronic transmissions. I understand that the practice of medicine is not an exact science and that Practitioner makes no warranties or guarantees regarding treatment outcomes. I acknowledge that a copy of this consent can be made available to me via my patient portal [facility] MyChart), or I can request a printed copy by calling the office of [facility].   ? ?I understand that my insurance will be billed for this visit.  ? ?I have read or had this consent read to me. ?I understand the contents of this consent, which adequately explains the benefits and risks of the Services being provided via telemedicine.  ?I have been provided ample opportunity to ask questions regarding this consent and the Services and have had my questions answered to my satisfaction. ?I give my informed consent for the services to be provided through the use of telemedicine in my medical care ? ? ? ?

## 2021-05-20 NOTE — Telephone Encounter (Signed)
Pt agreeable to plan of care for tele pre op appt 05/24/21 @ 9 am. Med rec and consent are done ?

## 2021-05-20 NOTE — Telephone Encounter (Signed)
? ?  Pre-operative Risk Assessment  ?  ?Patient Name: Judy [name]  ?DOB: 10-11-1960 ?MRN: [id number]  ? ?  ? ?Request for Surgical Clearance   ? ?Procedure:   Right shoulder attempted arthroscopic rotator cuff repair  ? ?Date of Surgery:  Clearance TBD                              ?   ?Surgeon:  Dr. Joya Salm ?Surgeon's Group or Practice Name:  [facility] ?Phone number:  [phone] ?Fax number:  [phone] ?  ?Type of Clearance Requested:   ?- Medical  ?  ?Type of Anesthesia:  General  ?  ?Additional requests/questions:   Pt takes ASA 81mg  daily ? ?Signed, ?Dametra Whetsel   ?05/20/2021, 9:49 AM  ? ?

## 2021-05-23 NOTE — Progress Notes (Signed)
? ?Virtual Visit via Telephone Note  ? ?This visit type was conducted due to national recommendations for restrictions regarding the COVID-19 Pandemic (e.g. social distancing) in an effort to limit this patient's exposure and mitigate transmission in our community.  Due to her co-morbid illnesses, this patient is at least at moderate risk for complications without adequate follow up.  This format is felt to be most appropriate for this patient at this time.  The patient did not have access to video technology/had technical difficulties with video requiring transitioning to audio format only (telephone).  All issues noted in this document were discussed and addressed.  No physical exam could be performed with this format.  Please refer to the patient's chart for her  consent to telehealth for [facility]. ? ?Evaluation Performed:  Preoperative cardiovascular risk assessment ?_____________  ? ?Date:  05/23/2021  ? ?Patient ID:  Judy [name], Judy [name] 03-05-1960, MRN [id number] ?Patient Location:  ?Home ?Provider location:   ?Office ? ?Primary Care Provider:  Blane Ohara, MD ?Primary Cardiologist:  Armanda Magic, MD ? ?Chief Complaint  ?  ?61 y.o. y/o female with a h/o NICM, chronic combined CHF, OSA on PAP, COPD, former tobacco abuse, hypertension, hyperlipidemia, and morbid obesity, who is pending Right shoulder attempted arthroscopic rotator cuff, and presents today for telephonic preoperative cardiovascular risk assessment. ? ?Past Medical History  ?  ?Past Medical History:  ?Diagnosis Date  ? Anxiety   ? Back pain   ? Bilateral swelling of feet   ? Chronic combined systolic and diastolic CHF (congestive heart failure) (HCC) 10/07/2014  ? COPD (chronic obstructive pulmonary disease) (HCC)   ? DCM (dilated cardiomyopathy) (HCC) 04/25/2014  ? EF 28% by MRI despite maximum medical therapy  ? Depression   ? Diabetes mellitus without complication (HCC)   ? Epilepsy (HCC)   ? as a child  ? Fatty liver   ? Former tobacco  use   ? Hyperlipidemia   ? Hypertension   ? Hypothyroidism   ? Joint pain   ? Leg swelling   ? Morbid obesity (HCC)   ? NICM (nonischemic cardiomyopathy) (HCC)   ? OSA (obstructive sleep apnea)   ? moderate with AHI 26/hr with oxygen desaturations as low as 70%  ? Scoliosis   ? Sleep apnea   ? SOB (shortness of breath)   ? ?Past Surgical History:  ?Procedure Laterality Date  ? ABDOMINAL HYSTERECTOMY    ? ankle artery involved cyst removal    ? CARDIAC CATHETERIZATION  05/14/2014  ? normal coronary arteries  ? CARPAL TUNNEL RELEASE    ? FASCIOTOMY    ? 1993 for platar fasciitis  ? harrington rod scoliosis    ? 1981  ? LEFT HEART CATHETERIZATION WITH CORONARY ANGIOGRAM N/A 05/14/2014  ? Procedure: LEFT HEART CATHETERIZATION WITH CORONARY ANGIOGRAM;  Surgeon: Iran Ouch, MD;  Location: [facility] CATH [facility];  Service: Cardiovascular;  Laterality: N/A;  ? ROTATOR CUFF REPAIR    ? UMBILICAL HERNIA REPAIR    ? VESICOVAGINAL FISTULA CLOSURE W/ TAH    ? ? ?Allergies ? ?Allergies  ?Allergen Reactions  ? Demerol [Meperidine] Nausea And Vomiting  ? ? ?History of Present Illness  ?  ?Judy [name] is a 61 y.o. female who presents via audio/video conferencing for a telehealth visit today.  Pt was last seen in cardiology clinic on 02/26/21 by Dr. Mayford Knife.  At that time Judy [name] was doing well.  The patient is now pending right shoulder arthroscopy.  Since her last visit, she is doing well. According to the Revised Cardiac Risk Index (RCRI), her Perioperative Risk of Major Cardiac Event is (%): 0.9 ?Her Functional Capacity in METs is: 4.64 according to the Duke Activity Status Index (DASI). ?Due to mobility issues related to chronic back pain, she uses an upright walker for ambulation. Without this device, she becomes short of breath.  ? ? ?Home Medications  ?  ?Prior to Admission medications   ?Medication Sig Start Date End Date Taking? Authorizing Provider  ?albuterol (VENTOLIN HFA) 108 (90 Base) MCG/ACT inhaler Inhale 2  puffs into the lungs every 6 (six) hours as needed for wheezing or shortness of breath. 04/08/20   Blane Ohara, MD  ?aspirin 81 MG tablet Take 81 mg by mouth at bedtime.     [provider]  ?atorvastatin (LIPITOR) 20 MG tablet Take 0.5 tablets (10 mg total) by mouth daily. Take half tablet daily (10 mg total daily) 01/28/21   Cox, Fritzi Mandes, MD  ?buPROPion (WELLBUTRIN XL) 300 MG 24 hr tablet TAKE 1 TABLET EVERY DAY 04/13/21   Cox, Fritzi Mandes, MD  ?carvedilol (COREG) 6.25 MG tablet Take 1 tablet (6.25 mg total) by mouth 2 (two) times daily. 07/20/20   Quintella Reichert, MD  ?clonazePAM (KLONOPIN) 0.5 MG tablet Take 1 tablet (0.5 mg total) by mouth 2 (two) times daily as needed for anxiety (panic attacks). 04/09/20   CoxFritzi Mandes, MD  ?Coenzyme Q10 (CO Q 10) 100 MG CAPS Take 200 mg by mouth daily.    [provider]  ?diphenhydrAMINE (BENADRYL) 25 MG tablet Take 50 mg by mouth at bedtime as needed.    [provider]  ?Dulaglutide (TRULICITY) 3 MG/0.5ML SOPN Inject 3 mg as directed once a week. 04/08/20   CoxFritzi Mandes, MD  ?Marcelline Deist 10 MG TABS tablet Take 1 tablet (10 mg total) by mouth daily. 04/19/21   CoxFritzi Mandes, MD  ?fenofibrate 160 MG tablet Take 160 mg by mouth daily. ?Patient not taking: Reported on 05/20/2021    [provider]  ?fluticasone (FLONASE) 50 MCG/ACT nasal spray Place 2 sprays into both nostrils daily. 05/03/21   Janie Morning, NP  ?gabapentin (NEURONTIN) 800 MG tablet Take 1 tablet (800 mg total) by mouth 3 (three) times daily. 01/29/21   CoxFritzi Mandes, MD  ?levothyroxine (SYNTHROID) 75 MCG tablet TAKE 1 TABLET (75 MCG TOTAL) BY MOUTH DAILY. 07/14/20   Marianne Sofia, PA-C  ?Lutein 20 MG TABS Take 1 tablet by mouth daily.    [provider]  ?meloxicam (MOBIC) 15 MG tablet Take 1 tablet (15 mg total) by mouth daily. 04/13/21   CoxFritzi Mandes, MD  ?Multiple Vitamin (MULTIVITAMIN WITH MINERALS) TABS tablet Take 1 tablet by mouth daily.    [provider]   ?ondansetron (ZOFRAN-ODT) 4 MG disintegrating tablet Take 1 tablet (4 mg total) by mouth every 8 (eight) hours as needed for nausea or vomiting. ?Patient not taking: Reported on 05/20/2021 05/03/21   Janie Morning, NP  ?OXYGEN 2lpm 2 lpm as needed    [provider]  ?sacubitril-valsartan (ENTRESTO) 24-26 MG Take 1 tablet by mouth 2 (two) times daily. 07/20/20   Quintella Reichert, MD  ?Tiotropium Bromide-Olodaterol (STIOLTO RESPIMAT) 2.5-2.5 MCG/ACT AERS INHALE 2 PUFFS INTO THE LUNGS DAILY. 03/18/20   Nyoka Cowden, MD  ?tiZANidine (ZANAFLEX) 4 MG tablet TAKE 1 TABLET EVERY 6 HOURS AS NEEDED FOR MUSCLE SPASM(S) 07/14/20   Blane Ohara, MD  ?traMADol (ULTRAM) 50 MG tablet Take  1 tablet (50 mg total) by mouth every 6 (six) hours as needed for severe pain. ?Patient taking differently: Take 50 mg by mouth. Take one four times a day as needed for pain 01/20/21   Cox, Fritzi Mandes, MD  ?UNABLE TO FIND CPAP with o2 2lpm  ?DME- [facility]    [provider]  ?VASCEPA 1 g capsule TAKE 2 CAPSULES BY MOUTH 2 TIMES DAILY. 11/02/20   Cox, Fritzi Mandes, MD  ?Vitamin D, Ergocalciferol, (DRISDOL) 1.25 MG (50000 UNIT) CAPS capsule TAKE 1 CAPSULE EVERY 7 DAYS 01/04/21   Blane Ohara, MD  ? ? ?Physical Exam  ?  ?Vital Signs:  HURLEY BOSEN does not have vital signs available for review today. ? ?Given telephonic nature of communication, physical exam is limited. ?AAOx3. NAD. Normal affect.  Speech and respirations are unlabored. ? ?Accessory Clinical Findings  ?  ?None ? ?Assessment & Plan  ?  ?1.  Preoperative Cardiovascular Risk Assessment: She may proceed with surgery from a cardiac perspective without further cardiac testing. She may hold aspirin for 5-7 days prior to surgery per surgeon's request and should resume as soon as she is hemodynamically stable.  ? ? ?A copy of this note will be routed to requesting surgeon. ? ?Time:   ?Today, I have spent 10 minutes with the patient with telehealth technology discussing medical history,  symptoms, and management plan.   ? ? ?Abbygail Willhoite, Zachary George, NP ? ?05/23/2021, 7:01 PM ? ? ?

## 2021-05-24 ENCOUNTER — Ambulatory Visit (INDEPENDENT_AMBULATORY_CARE_PROVIDER_SITE_OTHER): Payer: Medicare HMO | Admitting: Nurse Practitioner

## 2021-05-24 DIAGNOSIS — Z01810 Encounter for preprocedural cardiovascular examination: Secondary | ICD-10-CM

## 2021-05-26 ENCOUNTER — Other Ambulatory Visit: Payer: Self-pay

## 2021-05-26 MED ORDER — ENTRESTO 24-26 MG PO TABS: 1.0000 | ORAL_TABLET | Freq: Two times a day (BID) | ORAL | 3 refills | Status: AC

## 2021-05-27 ENCOUNTER — Telehealth: Payer: Self-pay | Admitting: Internal Medicine

## 2021-05-27 NOTE — Telephone Encounter (Signed)
Will address at ov

## 2021-05-27 NOTE — Telephone Encounter (Signed)
Fax received from Dr. Joya Salm to perform a Right shoulder attempted arthroscopic rotator cuff repair on patient.  Patient needs surgery clearance. Patient was seen on 09/09/2020. Patient has been scheduled for 06/07/2021 with Dr. Melvyn Novas. Office protocol is a risk assessment can be sent to surgeon if patient has been seen in 60 days or less.  ? ?Sending to Dr. Melvyn Novas for risk assessment or recommendations if patient needs to be seen in office prior to surgical procedure.   ?

## 2021-05-29 ENCOUNTER — Other Ambulatory Visit: Payer: Self-pay | Admitting: Family Medicine

## 2021-05-29 DIAGNOSIS — E7849 Other hyperlipidemia: Secondary | ICD-10-CM

## 2021-05-31 ENCOUNTER — Ambulatory Visit: Payer: Medicare HMO | Admitting: Family Medicine

## 2021-05-31 NOTE — Telephone Encounter (Signed)
Refill sent to pharmacy.   

## 2021-06-07 ENCOUNTER — Other Ambulatory Visit: Payer: Self-pay | Admitting: Family Medicine

## 2021-06-07 ENCOUNTER — Encounter: Payer: Self-pay | Admitting: Internal Medicine

## 2021-06-07 ENCOUNTER — Ambulatory Visit: Payer: Medicare HMO | Admitting: Internal Medicine

## 2021-06-07 ENCOUNTER — Ambulatory Visit (INDEPENDENT_AMBULATORY_CARE_PROVIDER_SITE_OTHER): Payer: Medicare HMO

## 2021-06-07 DIAGNOSIS — E7849 Other hyperlipidemia: Secondary | ICD-10-CM

## 2021-06-07 DIAGNOSIS — J449 Chronic obstructive pulmonary disease, unspecified: Secondary | ICD-10-CM

## 2021-06-07 DIAGNOSIS — J9611 Chronic respiratory failure with hypoxia: Secondary | ICD-10-CM | POA: Diagnosis not present

## 2021-06-07 DIAGNOSIS — R0600 Dyspnea, unspecified: Secondary | ICD-10-CM | POA: Diagnosis not present

## 2021-06-07 NOTE — Progress Notes (Signed)
Subjective:  ?  ? Patient ID: Judy [name], female   DOB: [birth year],  MRN: [id number] ? ?  ?Brief patient profile:  ?60 yowf MM/quit smoking 2007  @  150 lb  with cough that resolved and no resp problems until winter 2015 with doe x walking the dog s much in terms of cough then 10/04/13 sudden sense she couldn't get a breath and coughed up blood x one tsp plus slt green mucus  > better with saba and started on inhalers/ abx  >  better and pred taper and dx of GOLD II copd was made 11/2013 and non ischemic cardiomyopathy 05/14/14. ? ? ? ? ?History of Present Illness  ?10/08/2013 1st [facility] office visit/ Judy [name] ?Chief Complaint  ?Patient presents with  ? Pulmonary Consult  ?  Referred by Delynn Flavin Cox-sob with exertion x 6-8 mths.,worse since 4 days ago,occass. cough-tsp. of blood 4 days ago,usually unprod.,no wheezing,midchest tightness,no fcs,Had neb. since yesterday(used 2x yesterday)  ? baseline = 19ft   to mailbox and sob when gets there.  ?rec ?Clonidine 0.1 mg twice daily until you return  ?Plan A = automatic = symbiocort 160 Take 2 puffs first thing in am and then another 2 puffs about 12 hours later.  ?Plan B = Only use your albuterol (proair)as a rescue medication    ?Plan C = nebulizer albuterol, ok to use up to every 4 hours if can't get relief from Plan B ?GERD diet  ?  ? ?LHC 05/14/14  ?1. No significant coronary artery disease ? 2. Moderate to severely reduced LV systolic function due to nonischemic cardiomyopathy.   ? 3. Moderately elevated left ventricular end-diastolic pressure. ? ? ? ?08/29/2017  f/u ov/Judy [name] re:  GOLD III/ 02 dep hs with cpap and  ? With exertion  ?Chief Complaint  ?Patient presents with  ? Follow-up  ?  Breathing is some better but not back to baseline.  She has had sore throat and increased cough for the past few days. Cough has been non prod.   ? Dyspnea:  MMRC3 = some better but still  can't walk 100 yards even at a slow pace at a flat grade s stopping due to sob but  admits not good about checking sats when walking    ?Cough: dry day > noct new x sev months assoc with hoarseness not post nasal drip/ wheeze  ?SABA use: none ?02: 2lpm at hs and prn daytime  ?rec ?Try prilosec otc 20mg   Take 30-60 min before first meal of the day and Pepcid ac (famotidine) 20 mg one @  bedtime until cough is completely gone for at least a week  ? Adjust 02 to sats > 90% at all times especially while walking to help you burn fat and get into negative balance ?  ? ? ?09/09/2020  f/u ov/Judy [name] re: doe p covid/ GOLD III maint on stiolto and noct plus prn 02  ?Chief Complaint  ?Patient presents with  ? COPD  ?Dyspnea:  50 ft with standing walker not using 02 ?Cough: very little  ?Sleeping: cpap and 2pm flat 1 pillow  ?SABA use: none  ?02: 2lpm   ?Covid status:   vax x 3 and omicron may 2022 ?Rec ?Make sure you check your oxygen saturation  at your highest level of activity  to be sure it stays over 90%   ?To get the most out of exercise, you need to be continuously aware that you are short of breath  ?  No change in medications  ? ?  ? ? ? ?06/07/2021  f/u ov/Judy [name] re: GOLD 3    maint on stiolto/ needs surgical clearance   ?Chief Complaint  ?Patient presents with  ? Follow-up  ?  Needing right shoulder surgery- Dr. Samule Dry- needs pulmonary clearance. She has already received clearance from PCP and Cardiology.   ?Dyspnea: up walker at mall s 02  ?Cough: none  ?Sleeping:   sleeps flat bed with one pillow ?SABA use: none  ?02: 2lpm hs - none daytime "I can tell when it's low and it's not when I walk"  ?Covid status:   vax all but the covalent  ? ? ?No obvious day to day or daytime variability or assoc excess/ purulent sputum or mucus plugs or hemoptysis or cp or chest tightness, subjective wheeze or overt sinus or hb symptoms.  ? ?Sleeping  without nocturnal  or early am exacerbation  of respiratory  c/o's or need for noct saba. Also denies any obvious fluctuation of symptoms with weather or environmental changes  or other aggravating or alleviating factors except as outlined above  ? ?No unusual exposure hx or h/o childhood pna/ asthma or knowledge of premature birth. ? ?Current Allergies, Complete Past Medical History, Past Surgical History, Family History, and Social History were reviewed in [employer] record. ? ?ROS  The following are not active complaints unless bolded ?Hoarseness, sore throat, dysphagia, dental problems, itching, sneezing,  nasal congestion or discharge of excess mucus or purulent secretions, ear ache,   fever, chills, sweats, unintended wt loss or wt gain, classically pleuritic or exertional cp,  orthopnea pnd or arm/hand swelling  or leg swelling, presyncope, palpitations, abdominal pain, anorexia, nausea, vomiting, diarrhea  or change in bowel habits or change in bladder habits, change in stools or change in urine, dysuria, hematuria,  rash, arthralgias, visual complaints, headache, numbness, weakness or ataxia or problems with walking or coordination,  change in mood or  memory. ?      ? ?Current Meds  ?Medication Sig  ? albuterol (VENTOLIN HFA) 108 (90 Base) MCG/ACT inhaler Inhale 2 puffs into the lungs every 6 (six) hours as needed for wheezing or shortness of breath.  ? aspirin 81 MG tablet Take 81 mg by mouth at bedtime.   ? atorvastatin (LIPITOR) 20 MG tablet Take 0.5 tablets (10 mg total) by mouth daily. Take half tablet daily (10 mg total daily)  ? buPROPion (WELLBUTRIN XL) 300 MG 24 hr tablet TAKE 1 TABLET EVERY DAY  ? carvedilol (COREG) 6.25 MG tablet Take 1 tablet (6.25 mg total) by mouth 2 (two) times daily.  ? clonazePAM (KLONOPIN) 0.5 MG tablet Take 1 tablet (0.5 mg total) by mouth 2 (two) times daily as needed for anxiety (panic attacks).  ? Coenzyme Q10 (CO Q 10) 100 MG CAPS Take 200 mg by mouth daily.  ? diphenhydrAMINE (BENADRYL) 25 MG tablet Take 50 mg by mouth at bedtime as needed.  ? Dulaglutide (TRULICITY) 3 0000000 SOPN Inject 3 mg as directed once a  week.  ? FARXIGA 10 MG TABS tablet Take 1 tablet (10 mg total) by mouth daily.  ? fenofibrate 160 MG tablet Take 160 mg by mouth daily.  ? fluticasone (FLONASE) 50 MCG/ACT nasal spray Place 2 sprays into both nostrils daily.  ? gabapentin (NEURONTIN) 800 MG tablet TAKE 1 TABLET THREE TIMES DAILY  ? levothyroxine (SYNTHROID) 75 MCG tablet TAKE 1 TABLET (75 MCG TOTAL) BY MOUTH DAILY.  ? Lutein 20  MG TABS Take 1 tablet by mouth daily.  ? meloxicam (MOBIC) 15 MG tablet Take 1 tablet (15 mg total) by mouth daily.  ? Multiple Vitamin (MULTIVITAMIN WITH MINERALS) TABS tablet Take 1 tablet by mouth daily.  ? ondansetron (ZOFRAN-ODT) 4 MG disintegrating tablet Take 1 tablet (4 mg total) by mouth every 8 (eight) hours as needed for nausea or vomiting.  ? OXYGEN 2lpm 2 lpm as needed  ? sacubitril-valsartan (ENTRESTO) 24-26 MG Take 1 tablet by mouth 2 (two) times daily.  ? Tiotropium Bromide-Olodaterol (STIOLTO RESPIMAT) 2.5-2.5 MCG/ACT AERS INHALE 2 PUFFS INTO THE LUNGS DAILY.  ? tiZANidine (ZANAFLEX) 4 MG tablet TAKE 1 TABLET EVERY 6 HOURS AS NEEDED FOR MUSCLE SPASM(S)  ? traMADol (ULTRAM) 50 MG tablet Take 1 tablet (50 mg total) by mouth every 6 (six) hours as needed for severe pain. (Patient taking differently: Take 50 mg by mouth. Take one four times a day as needed for pain)  ? UNABLE TO FIND CPAP with o2 2lpm  ?DME- [facility]  ? VASCEPA 1 g capsule TAKE 2 CAPSULES BY MOUTH TWICE A DAY  ? Vitamin D, Ergocalciferol, (DRISDOL) 1.25 MG (50000 UNIT) CAPS capsule TAKE 1 CAPSULE EVERY 7 DAYS  ?    ?  ?  ?  ?  ?     ?   ?Objective:  ? Physical Exam ? ?Wts ? ?06/07/2021     271  ?09/09/2020     280  ?07/22/2020       284 ?07/08/2019     295  ?12/31/2018   293  ?11/27/2013     237 >   01/08/2014  236 > 01/21/2014 241 >235 02/21/2014 > 02/26/2014  232 > 03/27/2014  236 >236 04/10/2014 > 05/22/2014   237 >  08/21/2014 245 > 10/09/2014 257 >  01/12/2015   268 > 07/14/2015  278 > 01/14/2016  292  > 07/18/2017   301  > 08/29/2017  303 > 12/04/2017  298 >  06/26/2018  291  ? ? ?Vital signs reviewed  06/07/2021  - Note at rest 02 sats  96% on RA  ? ?General appearance:    amb obese wf nad  ? ? ? ?HEENT : pt wearing mask not removed for exam due to covid - 19 concerns.

## 2021-06-07 NOTE — Patient Instructions (Addendum)
We walk you today to clear you for surgery  ? ?Please remember to go to the  x-ray department  for your tests - we will call you with the results when they are available   ? ?I will clear you for surgery once I have reviewed your cxr.  ? ? If you are satisfied with your treatment plan,  let your doctor know and he/she can either refill your medications or you can return here when your prescription runs out.   ? ? If in any way you are not 100% satisfied,  please tell us.  If 100% better, tell your friends! ? ?Pulmonary follow up is as needed   ? ? ?

## 2021-06-08 ENCOUNTER — Encounter: Payer: Self-pay | Admitting: Internal Medicine

## 2021-06-08 NOTE — Telephone Encounter (Signed)
Also let her know cxr ok ?

## 2021-06-08 NOTE — Assessment & Plan Note (Signed)
Quit smoking around 2007  ?- 11/27/2013  PFTs   FEV1  1.51 (54%) ratio 52 and and no better p B2 and DLCO  71 ?- 01/08/2014  p extensive coaching HFA effectiveness =    90% > rec resume symbicort 160 2bid  ?- 03/27/14 trial of dulera 200/tudorza samples only to assure adherence  ?04/10/2014   Alpha 1 MM , nml level  ?- trial off spiriva 05/22/14 / off all resp rx 08/22/14 ?- PFT's  10/09/2014  FEV1 1.64 (59 % ) ratio 54  p 11 % improvement from saba with DLCO  69 % corrects to 73  % for alv volume   ?- 07/14/2015  A  > try spiriva 2 puffs each am > no better so pt d/c'd  ?- PFT's  07/18/2017  FEV1 1.10 (41 % ) ratio 46   p 19 % improvement from saba p nothing prior to study with DLCO  70 % corrects to 75  % for alv volume  On coreg  ?- 07/18/2017    try stiolto  ?- Spirometry 12/04/2017  FEV1 1.3 (49%)  Ratio 49 p am stiolto x 2 puffs  w classic curvature ?- 06/26/2018  After extensive coaching inhaler device,  effectiveness =   90%  ? ?Pt is Group B in terms of symptom/risk and laba/lama therefore appropriate rx at this point >>>  Continue stiolto  And approp saba/ no recent flares so no problem with clearing for shoulder surgery  ? ?  ?

## 2021-06-08 NOTE — Telephone Encounter (Signed)
Just finished note / cleared for surgery  ?

## 2021-06-08 NOTE — Telephone Encounter (Signed)
Please advise if patient is cleared for surgery ?

## 2021-06-08 NOTE — Assessment & Plan Note (Addendum)
Walk with desats 88% 04/10/2014 >begin O2 w/ act 2 l/m  ?-07/14/2015  Walked RA x 3 laps @ 185 ft each stopped due to end of study, nl pace, no significant desat or sob. > hs 02 only  ?- ono on 2lpm/cpap  07/20/2017   desats < 89% x 2.4 min   ?- 06/07/2021   Walked on RA  x  2  lap(s) =  approx 500  ft  @relatively  fast  Pace with uplift walker, stopped due to desats to 85%  with lowest 02 sats   ? ?F/u can be yearly or refills/ 02 cert by Dr if she agrees.  ? ?This is a chronic problem that does not limit or restrict her form shoulder surgery but advised to use min sedation, max mobilization post op and: Make sure you check your oxygen saturation at your highest level of activity to be sure it stays over 90% and keep track of it at least once a week, more often if breathing getting worse, and let me know if losing ground.  ? ? ?Each maintenance medication was reviewed in detail including emphasizing most importantly the difference between maintenance and prns and under what circumstances the prns are to be triggered using an action plan format where appropriate. ? ?Total time for H and P, chart review, counseling,  directly observing portions of ambulatory 02 saturation study/ and generating customized AVS unique to this office visit / same day charting = 36 min pre op eval   ?     ?  ?       ?

## 2021-06-09 DIAGNOSIS — J4520 Mild intermittent asthma, uncomplicated: Secondary | ICD-10-CM | POA: Diagnosis not present

## 2021-06-09 DIAGNOSIS — J449 Chronic obstructive pulmonary disease, unspecified: Secondary | ICD-10-CM | POA: Diagnosis not present

## 2021-06-14 HISTORY — PX: ROTATOR CUFF REPAIR: SHX139

## 2021-06-17 ENCOUNTER — Other Ambulatory Visit: Payer: Self-pay

## 2021-06-17 DIAGNOSIS — E7849 Other hyperlipidemia: Secondary | ICD-10-CM

## 2021-06-17 MED ORDER — ICOSAPENT ETHYL 1 G PO CAPS: 2.0000 g | ORAL_CAPSULE | Freq: Two times a day (BID) | ORAL | 2 refills | Status: DC

## 2021-06-17 NOTE — Telephone Encounter (Signed)
OV notes and clearance forms have been faxed back to [employer] and sports [facility]. Nothing further needed at this time. ?

## 2021-06-25 DIAGNOSIS — M25511 Pain in right shoulder: Secondary | ICD-10-CM | POA: Diagnosis not present

## 2021-07-01 DIAGNOSIS — E119 Type 2 diabetes mellitus without complications: Secondary | ICD-10-CM | POA: Diagnosis not present

## 2021-07-01 DIAGNOSIS — M7551 Bursitis of right shoulder: Secondary | ICD-10-CM | POA: Diagnosis not present

## 2021-07-01 DIAGNOSIS — Z9889 Other specified postprocedural states: Secondary | ICD-10-CM | POA: Diagnosis not present

## 2021-07-01 DIAGNOSIS — M7531 Calcific tendinitis of right shoulder: Secondary | ICD-10-CM | POA: Diagnosis not present

## 2021-07-01 DIAGNOSIS — M75121 Complete rotator cuff tear or rupture of right shoulder, not specified as traumatic: Secondary | ICD-10-CM | POA: Diagnosis not present

## 2021-07-01 DIAGNOSIS — M67811 Other specified disorders of synovium, right shoulder: Secondary | ICD-10-CM | POA: Diagnosis not present

## 2021-07-01 DIAGNOSIS — Z7985 Long-term (current) use of injectable non-insulin antidiabetic drugs: Secondary | ICD-10-CM | POA: Diagnosis not present

## 2021-07-01 DIAGNOSIS — M7541 Impingement syndrome of right shoulder: Secondary | ICD-10-CM | POA: Diagnosis not present

## 2021-07-01 DIAGNOSIS — M75101 Unspecified rotator cuff tear or rupture of right shoulder, not specified as traumatic: Secondary | ICD-10-CM | POA: Diagnosis not present

## 2021-07-01 DIAGNOSIS — Z7982 Long term (current) use of aspirin: Secondary | ICD-10-CM | POA: Diagnosis not present

## 2021-07-01 DIAGNOSIS — J449 Chronic obstructive pulmonary disease, unspecified: Secondary | ICD-10-CM | POA: Diagnosis not present

## 2021-07-01 DIAGNOSIS — S43431A Superior glenoid labrum lesion of right shoulder, initial encounter: Secondary | ICD-10-CM | POA: Diagnosis not present

## 2021-07-01 DIAGNOSIS — G8918 Other acute postprocedural pain: Secondary | ICD-10-CM | POA: Diagnosis not present

## 2021-07-01 DIAGNOSIS — I10 Essential (primary) hypertension: Secondary | ICD-10-CM | POA: Diagnosis not present

## 2021-07-06 DIAGNOSIS — M25611 Stiffness of right shoulder, not elsewhere classified: Secondary | ICD-10-CM | POA: Diagnosis not present

## 2021-07-06 DIAGNOSIS — M25511 Pain in right shoulder: Secondary | ICD-10-CM | POA: Diagnosis not present

## 2021-07-08 DIAGNOSIS — M25611 Stiffness of right shoulder, not elsewhere classified: Secondary | ICD-10-CM | POA: Diagnosis not present

## 2021-07-08 DIAGNOSIS — M25511 Pain in right shoulder: Secondary | ICD-10-CM | POA: Diagnosis not present

## 2021-07-09 DIAGNOSIS — J4520 Mild intermittent asthma, uncomplicated: Secondary | ICD-10-CM | POA: Diagnosis not present

## 2021-07-09 DIAGNOSIS — J449 Chronic obstructive pulmonary disease, unspecified: Secondary | ICD-10-CM | POA: Diagnosis not present

## 2021-07-13 DIAGNOSIS — M25611 Stiffness of right shoulder, not elsewhere classified: Secondary | ICD-10-CM | POA: Diagnosis not present

## 2021-07-13 DIAGNOSIS — M25511 Pain in right shoulder: Secondary | ICD-10-CM | POA: Diagnosis not present

## 2021-07-15 DIAGNOSIS — M25511 Pain in right shoulder: Secondary | ICD-10-CM | POA: Diagnosis not present

## 2021-07-15 DIAGNOSIS — M25611 Stiffness of right shoulder, not elsewhere classified: Secondary | ICD-10-CM | POA: Diagnosis not present

## 2021-07-18 ENCOUNTER — Encounter: Payer: Self-pay | Admitting: Cardiology

## 2021-07-18 DIAGNOSIS — G4733 Obstructive sleep apnea (adult) (pediatric): Secondary | ICD-10-CM

## 2021-07-20 DIAGNOSIS — M25611 Stiffness of right shoulder, not elsewhere classified: Secondary | ICD-10-CM | POA: Diagnosis not present

## 2021-07-20 DIAGNOSIS — M25511 Pain in right shoulder: Secondary | ICD-10-CM | POA: Diagnosis not present

## 2021-07-21 ENCOUNTER — Ambulatory Visit (INDEPENDENT_AMBULATORY_CARE_PROVIDER_SITE_OTHER): Payer: Medicare HMO

## 2021-07-21 DIAGNOSIS — F411 Generalized anxiety disorder: Secondary | ICD-10-CM

## 2021-07-21 DIAGNOSIS — E1159 Type 2 diabetes mellitus with other circulatory complications: Secondary | ICD-10-CM

## 2021-07-21 DIAGNOSIS — E038 Other specified hypothyroidism: Secondary | ICD-10-CM

## 2021-07-21 NOTE — Patient Instructions (Signed)
Visit Information   Goals Addressed   None    Patient Care Plan: CCM Pharmacy Care Plan     Problem Identified: dm, hld, chf   Priority: High  Onset Date: 05/14/2020     Long-Range Goal: Patient-Specific Goal   Start Date: 05/14/2020  Expected End Date: 05/14/2021  Recent Progress: On track  Priority: High  Note:    Current Barriers:  Unable to independently afford treatment regimen  Pharmacist Clinical Goal(s):  Patient will verbalize ability to afford treatment regimen through collaboration with PharmD and provider.   Interventions: 1:1 collaboration with Cox, Elnita Maxwell, MD regarding development and update of comprehensive plan of care as evidenced by provider attestation and co-signature Inter-disciplinary care team collaboration (see longitudinal plan of care) Comprehensive medication review performed; medication list updated in electronic medical record  Hyperlipidemia: (LDL goal < 70) Lab Results  Component Value Date   CHOL 132 04/20/2021   CHOL 142 01/15/2021   CHOL 135 10/13/2020   Lab Results  Component Value Date   HDL 37 (L) 04/20/2021   HDL 38 (L) 01/15/2021   HDL 36 (L) 10/13/2020   Lab Results  Component Value Date   LDLCALC 67 04/20/2021   [place] 69 01/15/2021   [place] 69 10/13/2020   Lab Results  Component Value Date   TRIG 163 (H) 04/20/2021   TRIG 214 (H) 01/15/2021   TRIG 176 (H) 10/13/2020   Lab Results  Component Value Date   CHOLHDL 3.6 04/20/2021   CHOLHDL 3.7 01/15/2021   CHOLHDL 3.8 10/13/2020  No results found for: LDLDIRECT -Not ideally controlled -Current treatment: vascepa 1 gram 2 capsules twice daily ([place] grant) Appropriate, Effective, Safe, Accessible Atorvastatin 20 mg daily Query Appropriate, Query effective, Safe, Accessible Aspirin 81 mg daily at bedtime Appropriate, Effective, Safe, Accessible -Medications previously tried: none reported  -Current dietary patterns: cooks at home and watches salt  intake -Current exercise habits: no formal exercise but has joined [employer] and hopes to begin working out in the pool -Educated on Cholesterol goals;  Benefits of statin for ASCVD risk reduction; Importance of limiting foods high in cholesterol; Exercise goal of 150 minutes per week; -Counseled on diet and exercise extensively Recommended to continue current medication Assessed cost of Vascepa. Coordinated [employer]. [employer] cannot secondary bill Vascepa to [place] so pharmacist coordinating fill for Vascepa through [facility].  June 2023: Candidate for high intensity statin  Diabetes (A1c goal <7%) Lab Results  Component Value Date   HGBA1C 5.8 (H) 04/20/2021   HGBA1C 5.9 (H) 01/15/2021   HGBA1C 6.1 (H) 10/13/2020   Lab Results  Component Value Date   MICROALBUR Negative 09/03/2019   LDLCALC 67 04/20/2021   CREATININE 0.93 04/20/2021   Lab Results  Component Value Date   NA 144 04/20/2021   K 4.5 04/20/2021   CREATININE 0.93 04/20/2021   GFRNONAA >60 02/27/2021   GFRAA 87 04/06/2020   GLUCOSE 86 04/20/2021   Lab Results  Component Value Date   WBC 6.3 04/20/2021   HGB 14.4 04/20/2021   HCT 42.7 04/20/2021   MCV 92 04/20/2021   PLT 235 04/20/2021  -controlled -Current medications: Trulicity 3 mg weekly (PAP) Appropriate, Effective, Safe, Accessible Farxiga 10 mg daily (PAP) Appropriate, Effective, Safe, Accessible -Medications previously tried: none reported  -Current home glucose readings fasting glucose:  June 2023: 90's-100's but patient rarely checks post prandial glucose: not checking  -Denies hypoglycemic/hyperglycemic symptoms -Current meal patterns:  Patient reports cooking at home mainly. Works to avoid  sodium and buys no salt added options. States that she probably doesn't eat as well as she should.  -Current exercise: no formal exercise currently but has joined the [employer] for water aerobics.  -Educated on A1c and blood sugar goals; Complications of  diabetes including kidney damage, retinal damage, and cardiovascular disease; Exercise goal of 150 minutes per week; Benefits of routine self-monitoring of blood sugar; -Counseled to check feet daily and get yearly eye exams -Counseled on diet and exercise extensively Recommended to continue current medication Collaborated with [employer] patient assistance to request Trulicity 3 mg weekly dose shipped to patient. Patient has had poor success with Libre 2 sample sticking to her arm. Pharmacist provided patch to assist with this.  Assessed patient's eligibility for [employer] patient assistance program.  [place]. Discussed benefit for heart failure, kidney and blood sugar management.   June 2023: Patient rarely checks sugars. Counseled at length on importance  Hypothyroid (Goal: manage TSH and symptoms of thyroid disease) Lab Results  Component Value Date   TSH 3.070 04/06/2020  -Controlled -Current treatment  Levothyroxine 75 mcg daily Appropriate, Effective, Safe, Accessible -Medications previously tried: none reported  -Recommended to continue current medication  Heart Failure (Goal: manage symptoms and prevent exacerbations) -Controlled -Home BP Readings:   June 2023: Doesn't test, counseled on testing slightly more often, especially after Sx -Last ejection fraction: 55% (Date: 06/2017) -HF type: Systolic -NYHA Class: II (slight limitation of activity) -Current treatment: Entresto 24-26 mg bid (PAP) Appropriate, Effective, Safe, Accessible Carvedilol 6.25 mg bid Appropriate, Effective, Safe, Accessible -Medications previously tried: Entresto higher doses -Current home BP/HR readings: 120-130/75-80 Pulse 70-80 -Current dietary habits: watches sodium and purchases no salt added products. Mainly cooks at home.  -Current exercise habits: no formal exercise currently. Has joined the [facility] and hopes to begin pool exercises.  -Educated on Benefits of  medications for managing symptoms and prolonging life Importance of blood pressure control -Counseled on diet and exercise extensively Recommended to continue current medication Recommend continuing to monitor heart rate and blood pressure to avoid hypotension.   COPD (Goal: control symptoms and prevent exacerbations) -Controlled -Current treatment  Albuterol inhaler 2 puffs into the lungs eery 6 hours prn wheezing or shortness of breath Appropriate, Effective, Safe, Accessible Stiolto 2 puffs daily (PAP) Appropriate, Effective, Safe, Accessible -Medications previously tried:  tudorza, symbicort  -Gold Grade: Gold IIB -Pulmonary function testing:  PF Readings from Last 3 Encounters:  No data found for PF  - 11/27/2013  PFTs   FEV1  1.51 (54%) ratio 52 and and no better p B2 and DLCO  71 - 01/08/2014  p extensive coaching HFA effectiveness =    90% > rec resume symbicort 160 2bid  - 03/27/14 trial of dulera 200/tudorza samples only to assure adherence  04/10/2014   Alpha 1 MM , nml level  - trial off spiriva 05/22/14 / off all resp rx 08/22/14 - PFT's  10/09/2014  FEV1 1.64 (59 % ) ratio 54  p 11 % improvement from saba with DLCO  69 % corrects to 73  % for alv volume   - 07/14/2015  A  > try spiriva 2 puffs each am > no better so pt d/c'd  - PFT's  07/18/2017  FEV1 1.10 (41 % ) ratio 46   p 19 % improvement from saba p nothing prior to study with DLCO  70 % corrects to 75  % for alv volume  On coreg  -  07/18/2017    try stiolto  - Spirometry 12/04/2017  FEV1 1.3 (49%)  Ratio 49 p am stiolto x 2 puffs  w classic curvature - 06/26/2018  After extensive coaching inhaler device,  effectiveness =   90%  -Exacerbations requiring treatment in last 6 months: none reported -Patient reports consistent use of maintenance inhaler -Frequency of rescue inhaler use: rarely but has one in case needed -Counseled on Benefits of consistent maintenance inhaler use Differences between maintenance and rescue  inhalers -Recommended to continue current medication  Depression/Anxiety (Goal: manage symptoms of anxiety) -Controlled -Current treatment: clonazepam 0.5 mg bid prn anxiety (Uses 2-3x/3 -6 months) Appropriate, Effective, Safe, Accessible Bupropion XL 300 mg daily Appropriate, Effective, Safe, Accessible -Medications previously tried/failed: none reported -PHQ9:     04/23/2021   11:07 AM 01/26/2021    2:43 PM 10/15/2020    2:58 PM  Depression screen PHQ 2/9  Decreased Interest 0 0 0  Down, Depressed, Hopeless 0 0 0  PHQ - 2 Score 0 0 0  Altered sleeping   0  Tired, decreased energy   0  Change in appetite   0  Feeling bad or failure about yourself    0  Trouble concentrating   2  Moving slowly or fidgety/restless   0  Suicidal thoughts   0  PHQ-9 Score   2  Difficult doing work/chores   Not difficult at all  -GAD7:      View : No data to display.         -Discussed benefits of exercise, cognitive behavior therapy, meditation or journaling for mental health support -Educated on Benefits of medication for symptom control Benefits of cognitive-behavioral therapy with or without medication -Counseled on diet and exercise extensively Recommended to continue current medication Educated on benefits of symptom management for overall health.   Chronic Back Pain (Goal: manage symptoms) -Hardware in back since 1981 Pain Scale: 10/28/20 With Meds: 4/10 (Content on therapy and she's able to "do things around the house"_ Without Meds: 5-6/10 -Controlled -Current treatment  tizanidine 4 mg every 6 hours prn Appropriate, Effective, Safe, Accessible Tramadol 50 mg bid prn Appropriate, Effective, Safe, Accessible Gabapentin 400 mg 2 capsules am and 2 capsules pm Appropriate, Effective, Safe, Accessible -Medications previously tried: none reported  -Recommended to continue current medication Counseled on benefits of pool exercise.   Patient Goals/Self-Care Activities Patient will:   - take medications as prescribed focus on medication adherence by using pill box check glucose daily, document, and provide at future appointments check blood pressure daily, document, and provide at future appointments target a minimum of 150 minutes of moderate intensity exercise weekly  Follow Up Plan: Telephone follow up appointment with care management team member scheduled for: November 2023  [place] Constable, Sherian Rein.D. [phone]      Judy [name] was given information about Chronic Care Management services today including:  CCM service includes personalized support from designated clinical staff supervised by her physician, including individualized plan of care and coordination with other care providers 24/7 contact phone numbers for assistance for urgent and routine care needs. Standard insurance, coinsurance, copays and deductibles apply for chronic care management only during months in which we provide at least 20 minutes of these services. Most insurances cover these services at 100%, however patients may be responsible for any copay, coinsurance and/or deductible if applicable. This service may help you avoid the need for more expensive face-to-face services. Only one practitioner may furnish and bill the service in  a calendar month. The patient may stop CCM services at any time (effective at the end of the month) by phone call to the office staff.  Patient agreed to services and verbal consent obtained.   The patient verbalized understanding of instructions, educational materials, and care plan provided today and DECLINED offer to receive copy of patient instructions, educational materials, and care plan.  The pharmacy team will reach out to the patient again over the next 60 days.   Lane Hacker, [place]

## 2021-07-21 NOTE — Progress Notes (Signed)
Chronic Care Management Pharmacy Note  07/21/2021 Name:  Judy [name] MRN:  [id number] DOB:  03/16/1960   Plan Updates:  Will renew PAP's in December  Subjective: Judy [name] is an 61 y.o. year old female who is a primary patient of Cox, Kirsten, MD.  The CCM team was consulted for assistance with disease management and care coordination needs.    Engaged with patient face to face for initial visit in response to provider referral for pharmacy case management and/or care coordination services.   Consent to Services:  The patient was given the following information about Chronic Care Management services today, agreed to services, and gave verbal consent: 1. CCM service includes personalized support from designated clinical staff supervised by the primary care provider, including individualized plan of care and coordination with other care providers 2. 24/7 contact phone numbers for assistance for urgent and routine care needs. 3. Service will only be billed when office clinical staff spend 20 minutes or more in a month to coordinate care. 4. Only one practitioner may furnish and bill the service in a calendar month. 5.The patient may stop CCM services at any time (effective at the end of the month) by phone call to the office staff. 6. The patient will be responsible for cost sharing (co-pay) of up to 20% of the service fee (after annual deductible is met). Patient agreed to services and consent obtained.  Patient Care Team: Cox, Elnita Maxwell, MD as PCP - General (Family Medicine) Sueanne Margarita, MD as PCP - Cardiology (Cardiology) Lane Hacker, [facility] (Pharmacist)  Recent office visits:  None   Recent consult visits:  None   Hospital visits:  None  Objective:  Lab Results  Component Value Date   CREATININE 0.93 04/20/2021   BUN 17 04/20/2021   GFR 100.87 10/01/2014   GFRNONAA >60 02/27/2021   GFRAA 87 04/06/2020   NA 144 04/20/2021   K 4.5 04/20/2021   CALCIUM 9.1  04/20/2021   CO2 24 04/20/2021   GLUCOSE 86 04/20/2021    Lab Results  Component Value Date/Time   HGBA1C 5.8 (H) 04/20/2021 09:09 AM   HGBA1C 5.9 (H) 01/15/2021 09:48 AM   GFR 100.87 10/01/2014 10:54 AM   GFR 84.34 05/13/2014 02:32 PM   MICROALBUR Negative 09/03/2019 02:33 PM    Last diabetic Eye exam:  Lab Results  Component Value Date/Time   HMDIABEYEEXA No Retinopathy 03/01/2021 12:00 AM    Last diabetic Foot exam: No results found for: HMDIABFOOTEX   Lab Results  Component Value Date   CHOL 132 04/20/2021   HDL 37 (L) 04/20/2021   LDLCALC 67 04/20/2021   TRIG 163 (H) 04/20/2021   CHOLHDL 3.6 04/20/2021       Latest Ref Rng & Units 04/20/2021    9:09 AM 02/27/2021    8:51 AM 01/15/2021    9:48 AM  Hepatic Function  Total Protein 6.0 - 8.5 g/dL 5.9   6.3   6.5    Albumin 3.8 - 4.9 g/dL 4.1   3.5   4.3    AST 0 - 40 IU/L '19   19   20    ' ALT 0 - 32 IU/L '29   30   23    ' Alk Phosphatase 44 - 121 IU/L 158   137   172    Total Bilirubin 0.0 - 1.2 mg/dL 1.0   1.1   1.0      Lab Results  Component Value Date/Time  TSH 3.070 04/06/2020 02:50 PM   TSH 2.020 07/17/2019 01:06 PM   FREET4 1.19 07/17/2019 01:06 PM       Latest Ref Rng & Units 04/20/2021    9:09 AM 02/27/2021    8:51 AM 01/15/2021    9:48 AM  CBC  WBC 3.4 - 10.8 x10E3/uL 6.3   15.6   6.4    Hemoglobin 11.1 - 15.9 g/dL 14.4   17.5   14.5    Hematocrit 34.0 - 46.6 % 42.7   54.1   43.9    Platelets 150 - 450 x10E3/uL 235   293   244      Lab Results  Component Value Date/Time   VD25OH 37.5 07/17/2019 01:06 PM    Clinical ASCVD: No  The 10-year ASCVD risk score (Arnett DK, et al., 2019) is: 8.3%   Values used to calculate the score:     Age: 76 years     Sex: Female     Is Non-Hispanic African American: No     Diabetic: Yes     Tobacco smoker: No     Systolic Blood Pressure: 638 mmHg     Is BP treated: Yes     HDL Cholesterol: 37 mg/dL     Total Cholesterol: 132 mg/dL       04/23/2021   11:07  AM 01/26/2021    2:43 PM 10/15/2020    2:58 PM  Depression screen PHQ 2/9  Decreased Interest 0 0 0  Down, Depressed, Hopeless 0 0 0  PHQ - 2 Score 0 0 0  Altered sleeping   0  Tired, decreased energy   0  Change in appetite   0  Feeling bad or failure about yourself    0  Trouble concentrating   2  Moving slowly or fidgety/restless   0  Suicidal thoughts   0  PHQ-9 Score   2  Difficult doing work/chores   Not difficult at all     Social History   Tobacco Use  Smoking Status Former   Packs/day: 1.00   Years: 15.00   Pack years: 15.00   Types: Cigarettes   Quit date: 02/14/2005   Years since quitting: 16.4  Smokeless Tobacco Never   BP Readings from Last 3 Encounters:  06/07/21 124/74  05/03/21 136/78  04/23/21 110/70   Pulse Readings from Last 3 Encounters:  06/07/21 90  05/03/21 95  04/23/21 92   Wt Readings from Last 3 Encounters:  06/07/21 271 lb (122.9 kg)  05/03/21 270 lb (122.5 kg)  04/23/21 274 lb (124.3 kg)   BMI Readings from Last 3 Encounters:  06/07/21 45.10 kg/m  05/03/21 44.93 kg/m  04/23/21 45.60 kg/m    Assessment/Interventions: Review of patient past medical history, allergies, medications, health status, including review of consultants reports, laboratory and other test data, was performed as part of comprehensive evaluation and provision of chronic care management services.   SDOH:  (Social Determinants of Health) assessments and interventions performed: Yes SDOH Interventions    Flowsheet Row Most Recent Value  SDOH Interventions   Financial Strain Interventions Other (Comment)  [PAP (See CP)]  Transportation Interventions Intervention Not Indicated      SDOH Screenings   Alcohol Screen: Not on file  Depression (PHQ2-9): Low Risk    PHQ-2 Score: 0  Financial Resource Strain: High Risk   Difficulty of Paying Living Expenses: Hard  Food Insecurity: Not on file  Housing: Not on file  Physical Activity: Inactive  Days of Exercise  per Week: 0 days   Minutes of Exercise per Session: 0 min  Social Connections: Not on file  Stress: Not on file  Tobacco Use: Medium Risk   Smoking Tobacco Use: Former   Smokeless Tobacco Use: Never   Passive Exposure: Not on file  Transportation Needs: No Transportation Needs   Lack of Transportation (Medical): No   Lack of Transportation (Non-Medical): No    CCM Care Plan  Allergies  Allergen Reactions   Demerol [Meperidine] Nausea And Vomiting    Medications Reviewed Today     Reviewed by Lane Hacker, [facility] (Pharmacist) on 07/21/21 at [place] List Status: <None>   Medication Order Taking? Sig Documenting Provider Last Dose Status Informant  albuterol (VENTOLIN HFA) 108 (90 Base) MCG/ACT inhaler [id number] No Inhale 2 puffs into the lungs every 6 (six) hours as needed for wheezing or shortness of breath. CoxElnita Maxwell, MD Taking Active   aspirin 81 MG tablet [id number] No Take 81 mg by mouth at bedtime.  [provider] Taking Active Self  atorvastatin (LIPITOR) 20 MG tablet [id number] No Take 0.5 tablets (10 mg total) by mouth daily. Take half tablet daily (10 mg total daily) Cox, Kirsten, MD Taking Active   buPROPion (WELLBUTRIN XL) 300 MG 24 hr tablet [id number] No TAKE 1 TABLET EVERY DAY Cox, Kirsten, MD Taking Active   carvedilol (COREG) 6.25 MG tablet [id number] No Take 1 tablet (6.25 mg total) by mouth 2 (two) times daily. Sueanne Margarita, MD Taking Active   clonazePAM (KLONOPIN) 0.5 MG tablet [id number] No Take 1 tablet (0.5 mg total) by mouth 2 (two) times daily as needed for anxiety (panic attacks). Cox, Kirsten, MD Taking Active   Coenzyme Q10 (CO Q 10) 100 MG CAPS [id number] No Take 200 mg by mouth daily. [provider] Taking Active   diphenhydrAMINE (BENADRYL) 25 MG tablet [id number] No Take 50 mg by mouth at bedtime as needed. [provider] Taking Active   Dulaglutide (TRULICITY) 3 JH/4.1DE SOPN [id number] No Inject 3 mg as directed once a  week. Cox, Kirsten, MD Taking Active   FARXIGA 10 MG TABS tablet [id number] No Take 1 tablet (10 mg total) by mouth daily. Rochel Brome, MD Taking Active   fenofibrate 160 MG tablet [id number] No Take 160 mg by mouth daily. [provider] Taking Active   fluticasone (FLONASE) 50 MCG/ACT nasal spray [id number] No Place 2 sprays into both nostrils daily. Rip Harbour, NP Taking Active   gabapentin (NEURONTIN) 800 MG tablet [id number] No TAKE 1 TABLET THREE TIMES DAILY Cox, Kirsten, MD Taking Active   icosapent Ethyl (VASCEPA) 1 g capsule [id number]  Take 2 capsules (2 g total) by mouth 2 (two) times daily. Cox, Kirsten, MD  Active   levothyroxine (SYNTHROID) 75 MCG tablet [id number] No TAKE 1 TABLET (75 MCG TOTAL) BY MOUTH DAILY. Marge Duncans, PA-C Taking Active   Lutein 20 MG TABS [id number] No Take 1 tablet by mouth daily. [provider] Taking Active   meloxicam (MOBIC) 15 MG tablet [id number] No Take 1 tablet (15 mg total) by mouth daily. Cox, Kirsten, MD Taking Active   Multiple Vitamin (MULTIVITAMIN WITH MINERALS) TABS tablet [id number] No Take 1 tablet by mouth daily. [provider] Taking Active Self  ondansetron (ZOFRAN-ODT) 4 MG disintegrating tablet [id number] No Take 1 tablet (4 mg total) by mouth every 8 (eight) hours as needed for nausea or vomiting. Rip Harbour, NP Taking Active   OXYGEN  [id number] No 2lpm 2 lpm as needed [provider] Taking Active   sacubitril-valsartan (ENTRESTO) 24-26 MG [id number] No Take 1 tablet by mouth 2 (two) times daily. Cox, Kirsten, MD Taking Active   Tiotropium Bromide-Olodaterol (STIOLTO RESPIMAT) 2.5-2.5 MCG/ACT AERS [id number] No INHALE 2 PUFFS INTO THE LUNGS DAILY. Tanda Rockers, MD Taking Active   tiZANidine (ZANAFLEX) 4 MG tablet [id number] No TAKE 1 TABLET EVERY 6 HOURS AS NEEDED FOR MUSCLE SPASM(S) Cox, Kirsten, MD Taking Active   traMADol (ULTRAM) 50 MG tablet [id number] No Take 1 tablet (50 mg total) by mouth  every 6 (six) hours as needed for severe pain.  Patient taking differently: Take 50 mg by mouth. Take one four times a day as needed for pain   Cox, Elnita Maxwell, MD Taking Active   UNABLE TO FIND [id number] No CPAP with o2 2lpm  DME- [facility] [provider] Taking Active   Vitamin D, Ergocalciferol, (DRISDOL) 1.25 MG (50000 UNIT) CAPS capsule [id number] No TAKE 1 CAPSULE EVERY 7 DAYS Cox, Kirsten, MD Taking Active             Patient Active Problem List   Diagnosis Date Noted   Visit for screening mammogram 05/15/2021   Trigger index finger of right hand 05/15/2021   Chronic right shoulder pain 05/15/2021   Ulnar neuropathy at elbow of right upper extremity 05/15/2021   Cervical radiculopathy 01/28/2021   Ulnar impingement syndrome of right upper extremity 11/10/2020   GAD (generalized anxiety disorder) 09/03/2019   Arthritis 09/03/2019   Depression 09/03/2019   H/O scoliosis 09/03/2019   History of migraine headaches 09/03/2019   Neuropathy 09/03/2019   Post-menopausal 09/03/2019   Seasonal allergies 09/03/2019   Vitamin D deficiency 09/03/2019   Hypertensive heart disease with CHF (congestive heart failure) ([place]) 09/03/2019   Other specified hypothyroidism 09/03/2019   Hyperlipidemia associated with type 2 diabetes mellitus ([place]) 07/30/2019   OSA (obstructive sleep apnea)    Leg swelling 56/25/6389   Chronic systolic CHF (congestive heart failure), NYHA class 2 ([place]) 10/07/2014   Morbid obesity due to excess calories ([place]) 08/23/2014   DCM (dilated cardiomyopathy) ([place]) 04/25/2014   COPD (chronic obstructive pulmonary disease) ([place])    Chronic respiratory failure with hypoxia ([place]) 04/10/2014   Hypertension associated with diabetes ([place]) 10/26/2013   COPD GOLD  III  10/09/2013   Ganglion cyst 10/11/2012   Other ganglion and cyst of synovium, tendon, and bursa(727.49) 09/20/2012   Degenerative arthritis of hip 07/26/2012   Foot pain, bilateral 05/31/2012   Hip pain,  right 05/31/2012   Facet arthropathy, lumbar 03/08/2012   Flatback syndrome 03/08/2012   Long term current use of opiate analgesic 03/08/2012   Pain syndrome, chronic 03/08/2012   Knee pain 01/06/2010   Mid back pain 01/06/2010   Neck pain 01/06/2010   Transfusion history 11/14/2006    Immunization History  Administered Date(s) Administered   Influenza Inj Mdck Quad Pf 01/13/2020, 10/15/2020   Influenza Split 10/15/2013, 11/24/2014   Influenza-Unspecified 11/14/2012, 11/04/2018   PFIZER(Purple Top)SARS-COV-2 Vaccination 05/09/2019, 06/03/2019, 11/14/2019   Pfizer Covid-19 Vaccine Bivalent Booster 45yr & up 11/10/2020   Pneumococcal Polysaccharide-23 12/05/2013, 11/28/2014   Pneumococcal-Unspecified 10/15/2013   Tdap 09/30/2015    Conditions to be addressed/monitored:  Hypertension, Hyperlipidemia, Diabetes, COPD, Hypothyroidism, Depression, Anxiety, Osteoarthritis and vitamin d deficiency.   Care Plan : [place] Updates made by KLane Hacker RXeniasince 07/21/2021 12:00 AM     Problem: dm, hld, chf   Priority:  High  Onset Date: 05/14/2020     Long-Range Goal: Patient-Specific Goal   Start Date: 05/14/2020  Expected End Date: 05/14/2021  Recent Progress: On track  Priority: High  Note:    Current Barriers:  Unable to independently afford treatment regimen  Pharmacist Clinical Goal(s):  Patient will verbalize ability to afford treatment regimen through collaboration with PharmD and provider.   Interventions: 1:1 collaboration with Cox, Elnita Maxwell, MD regarding development and update of comprehensive plan of care as evidenced by provider attestation and co-signature Inter-disciplinary care team collaboration (see longitudinal plan of care) Comprehensive medication review performed; medication list updated in electronic medical record  Hyperlipidemia: (LDL goal < 70) Lab Results  Component Value Date   CHOL 132 04/20/2021   CHOL 142 01/15/2021   CHOL  135 10/13/2020   Lab Results  Component Value Date   HDL 37 (L) 04/20/2021   HDL 38 (L) 01/15/2021   HDL 36 (L) 10/13/2020   Lab Results  Component Value Date   LDLCALC 67 04/20/2021   [place] 69 01/15/2021   [place] 69 10/13/2020   Lab Results  Component Value Date   TRIG 163 (H) 04/20/2021   TRIG 214 (H) 01/15/2021   TRIG 176 (H) 10/13/2020   Lab Results  Component Value Date   CHOLHDL 3.6 04/20/2021   CHOLHDL 3.7 01/15/2021   CHOLHDL 3.8 10/13/2020  No results found for: LDLDIRECT -Not ideally controlled -Current treatment: vascepa 1 gram 2 capsules twice daily ([place] grant) Appropriate, Effective, Safe, Accessible Atorvastatin 20 mg daily Query Appropriate, Query effective, Safe, Accessible Aspirin 81 mg daily at bedtime Appropriate, Effective, Safe, Accessible -Medications previously tried: none reported  -Current dietary patterns: cooks at home and watches salt intake -Current exercise habits: no formal exercise but has joined [employer] and hopes to begin working out in the pool -Educated on Cholesterol goals;  Benefits of statin for ASCVD risk reduction; Importance of limiting foods high in cholesterol; Exercise goal of 150 minutes per week; -Counseled on diet and exercise extensively Recommended to continue current medication Assessed cost of Vascepa. Coordinated [employer]. [employer] cannot secondary bill Vascepa to [place] so pharmacist coordinating [facility].  June 2023: Candidate for high intensity statin  Diabetes (A1c goal <7%) Lab Results  Component Value Date   HGBA1C 5.8 (H) 04/20/2021   HGBA1C 5.9 (H) 01/15/2021   HGBA1C 6.1 (H) 10/13/2020   Lab Results  Component Value Date   MICROALBUR Negative 09/03/2019   LDLCALC 67 04/20/2021   CREATININE 0.93 04/20/2021   Lab Results  Component Value Date   NA 144 04/20/2021   K 4.5 04/20/2021   CREATININE 0.93 04/20/2021   GFRNONAA >60 02/27/2021   GFRAA 87 04/06/2020    GLUCOSE 86 04/20/2021   Lab Results  Component Value Date   WBC 6.3 04/20/2021   HGB 14.4 04/20/2021   HCT 42.7 04/20/2021   MCV 92 04/20/2021   PLT 235 04/20/2021  -controlled -Current medications: Trulicity 3 mg weekly (PAP) Appropriate, Effective, Safe, Accessible Farxiga 10 mg daily (PAP) Appropriate, Effective, Safe, Accessible -Medications previously tried: none reported  -Current home glucose readings fasting glucose:  June 2023: 90's-100's but patient rarely checks post prandial glucose: not checking  -Denies hypoglycemic/hyperglycemic symptoms -Current meal patterns:  Patient reports cooking at home mainly. Works to avoid sodium and buys no salt added options. States that she probably doesn't eat as well as she should.  -Current exercise: no formal exercise currently but has joined the [employer]  for water aerobics.  -Educated on A1c and blood sugar goals; Complications of diabetes including kidney damage, retinal damage, and cardiovascular disease; Exercise goal of 150 minutes per week; Benefits of routine self-monitoring of blood sugar; -Counseled to check feet daily and get yearly eye exams -Counseled on diet and exercise extensively Recommended to continue current medication Collaborated with [employer] patient assistance to request Trulicity 3 mg weekly dose shipped to patient. Patient has had poor success with Libre 2 sample sticking to her arm. Pharmacist provided patch to assist with this.  Assessed patient's eligibility for [employer] patient assistance program.  Patient currently receives [place] from [place] and [place]. Discussed benefit for heart failure, kidney and blood sugar management.   June 2023: Patient rarely checks sugars. Counseled at length on importance  Hypothyroid (Goal: manage TSH and symptoms of thyroid disease) Lab Results  Component Value Date   TSH 3.070 04/06/2020  -Controlled -Current treatment  Levothyroxine 75 mcg daily Appropriate, Effective, Safe,  Accessible -Medications previously tried: none reported  -Recommended to continue current medication  Heart Failure (Goal: manage symptoms and prevent exacerbations) -Controlled -Home BP Readings:   June 2023: Doesn't test, counseled on testing slightly more often, especially after Sx -Last ejection fraction: 55% (Date: 06/2017) -HF type: Systolic -NYHA Class: II (slight limitation of activity) -Current treatment: Entresto 24-26 mg bid (PAP) Appropriate, Effective, Safe, Accessible Carvedilol 6.25 mg bid Appropriate, Effective, Safe, Accessible -Medications previously tried: Entresto higher doses -Current home BP/HR readings: 120-130/75-80 Pulse 70-80 -Current dietary habits: watches sodium and purchases no salt added products. Mainly cooks at home.  -Current exercise habits: no formal exercise currently. Has joined the [facility] and hopes to begin pool exercises.  -Educated on Benefits of medications for managing symptoms and prolonging life Importance of blood pressure control -Counseled on diet and exercise extensively Recommended to continue current medication Recommend continuing to monitor heart rate and blood pressure to avoid hypotension.   COPD (Goal: control symptoms and prevent exacerbations) -Controlled -Current treatment  Albuterol inhaler 2 puffs into the lungs eery 6 hours prn wheezing or shortness of breath Appropriate, Effective, Safe, Accessible Stiolto 2 puffs daily (PAP) Appropriate, Effective, Safe, Accessible -Medications previously tried:  tudorza, symbicort  -Gold Grade: Gold IIB -Pulmonary function testing:  PF Readings from Last 3 Encounters:  No data found for PF  - 11/27/2013  PFTs   FEV1  1.51 (54%) ratio 52 and and no better p B2 and DLCO  71 - 01/08/2014  p extensive coaching HFA effectiveness =    90% > rec resume symbicort 160 2bid  - 03/27/14 trial of dulera 200/tudorza samples only to assure adherence  04/10/2014   Alpha 1 MM , nml level  - trial off  spiriva 05/22/14 / off all resp rx 08/22/14 - PFT's  10/09/2014  FEV1 1.64 (59 % ) ratio 54  p 11 % improvement from saba with DLCO  69 % corrects to 73  % for alv volume   - 07/14/2015  A  > try spiriva 2 puffs each am > no better so pt d/c'd  - PFT's  07/18/2017  FEV1 1.10 (41 % ) ratio 46   p 19 % improvement from saba p nothing prior to study with DLCO  70 % corrects to 75  % for alv volume  On coreg  - 07/18/2017    try stiolto  - Spirometry 12/04/2017  FEV1 1.3 (49%)  Ratio 49 p am stiolto x 2 puffs  w classic curvature - 06/26/2018  After extensive coaching inhaler device,  effectiveness =   90%  -Exacerbations requiring treatment in last 6 months: none reported -Patient reports consistent use of maintenance inhaler -Frequency of rescue inhaler use: rarely but has one in case needed -Counseled on Benefits of consistent maintenance inhaler use Differences between maintenance and rescue inhalers -Recommended to continue current medication  Depression/Anxiety (Goal: manage symptoms of anxiety) -Controlled -Current treatment: clonazepam 0.5 mg bid prn anxiety (Uses 2-3x/3 -6 months) Appropriate, Effective, Safe, Accessible Bupropion XL 300 mg daily Appropriate, Effective, Safe, Accessible -Medications previously tried/failed: none reported -PHQ9:     04/23/2021   11:07 AM 01/26/2021    2:43 PM 10/15/2020    2:58 PM  Depression screen PHQ 2/9  Decreased Interest 0 0 0  Down, Depressed, Hopeless 0 0 0  PHQ - 2 Score 0 0 0  Altered sleeping   0  Tired, decreased energy   0  Change in appetite   0  Feeling bad or failure about yourself    0  Trouble concentrating   2  Moving slowly or fidgety/restless   0  Suicidal thoughts   0  PHQ-9 Score   2  Difficult doing work/chores   Not difficult at all  -GAD7:      View : No data to display.         -Discussed benefits of exercise, cognitive behavior therapy, meditation or journaling for mental health support -Educated on Benefits of  medication for symptom control Benefits of cognitive-behavioral therapy with or without medication -Counseled on diet and exercise extensively Recommended to continue current medication Educated on benefits of symptom management for overall health.   Chronic Back Pain (Goal: manage symptoms) -Hardware in back since 1981 Pain Scale: 10/28/20 With Meds: 4/10 (Content on therapy and she's able to "do things around the house"_ Without Meds: 5-6/10 -Controlled -Current treatment  tizanidine 4 mg every 6 hours prn Appropriate, Effective, Safe, Accessible Tramadol 50 mg bid prn Appropriate, Effective, Safe, Accessible Gabapentin 400 mg 2 capsules am and 2 capsules pm Appropriate, Effective, Safe, Accessible -Medications previously tried: none reported  -Recommended to continue current medication Counseled on benefits of pool exercise.   Patient Goals/Self-Care Activities Patient will:  - take medications as prescribed focus on medication adherence by using pill box check glucose daily, document, and provide at future appointments check blood pressure daily, document, and provide at future appointments target a minimum of 150 minutes of moderate intensity exercise weekly  Follow Up Plan: Telephone follow up appointment with care management team member scheduled for: November 2023  [place] Constable, Sherian Rein.D. - [phone]       Medication Assistance:  Zollie Beckers, Judithann Sauger, [place] ([place] grant)obtained through [place] and [place]. medication assistance program.  Enrollment ends 02/13/2022  Patient's preferred pharmacy is:  [facility], [place] [address] Phone: [phone] Fax: [phone]  [id number] [place], [place]Phone: [phone]: [phone] [id number] [place] [place] [id number]: [phone]: [phone] Uses pill box? Yes - prepared 3 weeks of pill organizers at a time Pt endorses excellent compliance  We discussed: Current pharmacy is preferred with insurance plan and patient is satisfied with pharmacy services Patient decided to: Continue current medication management strategy  Care Plan and Follow Up  Patient Decision:  Patient agrees to Care Plan and Follow-up.  Plan: Telephone follow up appointment with care management team member scheduled for:  November 2023  [place] Constable, [place].D. - [phone]

## 2021-07-22 DIAGNOSIS — M25611 Stiffness of right shoulder, not elsewhere classified: Secondary | ICD-10-CM | POA: Diagnosis not present

## 2021-07-22 DIAGNOSIS — M25511 Pain in right shoulder: Secondary | ICD-10-CM | POA: Diagnosis not present

## 2021-07-26 DIAGNOSIS — M25511 Pain in right shoulder: Secondary | ICD-10-CM | POA: Diagnosis not present

## 2021-07-26 DIAGNOSIS — M25611 Stiffness of right shoulder, not elsewhere classified: Secondary | ICD-10-CM | POA: Diagnosis not present

## 2021-07-29 DIAGNOSIS — M25511 Pain in right shoulder: Secondary | ICD-10-CM | POA: Diagnosis not present

## 2021-07-29 DIAGNOSIS — M25611 Stiffness of right shoulder, not elsewhere classified: Secondary | ICD-10-CM | POA: Diagnosis not present

## 2021-08-02 DIAGNOSIS — M25511 Pain in right shoulder: Secondary | ICD-10-CM | POA: Diagnosis not present

## 2021-08-02 DIAGNOSIS — M25611 Stiffness of right shoulder, not elsewhere classified: Secondary | ICD-10-CM | POA: Diagnosis not present

## 2021-08-05 DIAGNOSIS — M25611 Stiffness of right shoulder, not elsewhere classified: Secondary | ICD-10-CM | POA: Diagnosis not present

## 2021-08-05 DIAGNOSIS — M25511 Pain in right shoulder: Secondary | ICD-10-CM | POA: Diagnosis not present

## 2021-08-09 DIAGNOSIS — M25611 Stiffness of right shoulder, not elsewhere classified: Secondary | ICD-10-CM | POA: Diagnosis not present

## 2021-08-09 DIAGNOSIS — J449 Chronic obstructive pulmonary disease, unspecified: Secondary | ICD-10-CM | POA: Diagnosis not present

## 2021-08-09 DIAGNOSIS — M25511 Pain in right shoulder: Secondary | ICD-10-CM | POA: Diagnosis not present

## 2021-08-09 DIAGNOSIS — J4520 Mild intermittent asthma, uncomplicated: Secondary | ICD-10-CM | POA: Diagnosis not present

## 2021-08-11 ENCOUNTER — Other Ambulatory Visit: Payer: Self-pay | Admitting: Family Medicine

## 2021-08-11 DIAGNOSIS — G894 Chronic pain syndrome: Secondary | ICD-10-CM

## 2021-08-12 DIAGNOSIS — M25511 Pain in right shoulder: Secondary | ICD-10-CM | POA: Diagnosis not present

## 2021-08-12 DIAGNOSIS — M25611 Stiffness of right shoulder, not elsewhere classified: Secondary | ICD-10-CM | POA: Diagnosis not present

## 2021-08-13 DIAGNOSIS — E038 Other specified hypothyroidism: Secondary | ICD-10-CM | POA: Diagnosis not present

## 2021-08-13 DIAGNOSIS — F32A Depression, unspecified: Secondary | ICD-10-CM | POA: Diagnosis not present

## 2021-08-13 DIAGNOSIS — J449 Chronic obstructive pulmonary disease, unspecified: Secondary | ICD-10-CM

## 2021-08-13 DIAGNOSIS — I10 Essential (primary) hypertension: Secondary | ICD-10-CM | POA: Diagnosis not present

## 2021-08-13 DIAGNOSIS — E1159 Type 2 diabetes mellitus with other circulatory complications: Secondary | ICD-10-CM

## 2021-08-18 DIAGNOSIS — M25611 Stiffness of right shoulder, not elsewhere classified: Secondary | ICD-10-CM | POA: Diagnosis not present

## 2021-08-18 DIAGNOSIS — M25511 Pain in right shoulder: Secondary | ICD-10-CM | POA: Diagnosis not present

## 2021-08-27 ENCOUNTER — Other Ambulatory Visit: Payer: Self-pay | Admitting: Physician Assistant

## 2021-08-27 ENCOUNTER — Other Ambulatory Visit: Payer: Self-pay | Admitting: Cardiology

## 2021-08-27 DIAGNOSIS — M25611 Stiffness of right shoulder, not elsewhere classified: Secondary | ICD-10-CM | POA: Diagnosis not present

## 2021-08-27 DIAGNOSIS — M25511 Pain in right shoulder: Secondary | ICD-10-CM | POA: Diagnosis not present

## 2021-09-02 DIAGNOSIS — M25611 Stiffness of right shoulder, not elsewhere classified: Secondary | ICD-10-CM | POA: Diagnosis not present

## 2021-09-02 DIAGNOSIS — M25511 Pain in right shoulder: Secondary | ICD-10-CM | POA: Diagnosis not present

## 2021-09-03 ENCOUNTER — Other Ambulatory Visit: Payer: Medicare HMO

## 2021-09-03 ENCOUNTER — Other Ambulatory Visit: Payer: Self-pay

## 2021-09-03 DIAGNOSIS — E1169 Type 2 diabetes mellitus with other specified complication: Secondary | ICD-10-CM | POA: Diagnosis not present

## 2021-09-03 DIAGNOSIS — E559 Vitamin D deficiency, unspecified: Secondary | ICD-10-CM | POA: Diagnosis not present

## 2021-09-03 DIAGNOSIS — E1165 Type 2 diabetes mellitus with hyperglycemia: Secondary | ICD-10-CM | POA: Diagnosis not present

## 2021-09-03 DIAGNOSIS — E785 Hyperlipidemia, unspecified: Secondary | ICD-10-CM | POA: Diagnosis not present

## 2021-09-03 DIAGNOSIS — E1159 Type 2 diabetes mellitus with other circulatory complications: Secondary | ICD-10-CM

## 2021-09-03 DIAGNOSIS — I5022 Chronic systolic (congestive) heart failure: Secondary | ICD-10-CM

## 2021-09-03 DIAGNOSIS — I152 Hypertension secondary to endocrine disorders: Secondary | ICD-10-CM | POA: Diagnosis not present

## 2021-09-03 DIAGNOSIS — E782 Mixed hyperlipidemia: Secondary | ICD-10-CM | POA: Insufficient documentation

## 2021-09-03 DIAGNOSIS — E038 Other specified hypothyroidism: Secondary | ICD-10-CM

## 2021-09-04 LAB — CBC WITH DIFFERENTIAL/PLATELET
Basophils Absolute: 0.1 10*3/uL (ref 0.0–0.2)
Basos: 1 %
EOS (ABSOLUTE): 0.3 10*3/uL (ref 0.0–0.4)
Eos: 4 %
Hematocrit: 42.3 % (ref 34.0–46.6)
Hemoglobin: 14.4 g/dL (ref 11.1–15.9)
Immature Grans (Abs): 0 10*3/uL (ref 0.0–0.1)
Immature Granulocytes: 0 %
Lymphocytes Absolute: 1.7 10*3/uL (ref 0.7–3.1)
Lymphs: 21 %
MCH: 31.3 pg (ref 26.6–33.0)
MCHC: 34 g/dL (ref 31.5–35.7)
MCV: 92 fL (ref 79–97)
Monocytes Absolute: 0.6 10*3/uL (ref 0.1–0.9)
Monocytes: 8 %
Neutrophils Absolute: 5.3 10*3/uL (ref 1.4–7.0)
Neutrophils: 66 %
Platelets: 234 10*3/uL (ref 150–450)
RBC: 4.6 x10E6/uL (ref 3.77–5.28)
RDW: 12.5 % (ref 11.7–15.4)
WBC: 8 10*3/uL (ref 3.4–10.8)

## 2021-09-04 LAB — LIPID PANEL
Chol/HDL Ratio: 3.3 ratio (ref 0.0–4.4)
Cholesterol, Total: 131 mg/dL (ref 100–199)
HDL: 40 mg/dL (ref >39)
LDL Chol Calc (NIH): 64 mg/dL (ref 0–99)
Triglycerides: 154 mg/dL — ABNORMAL HIGH (ref 0–149)
VLDL Cholesterol Cal: 27 mg/dL (ref 5–40)

## 2021-09-04 LAB — COMPREHENSIVE METABOLIC PANEL
ALT: 23 IU/L (ref 0–32)
AST: 18 IU/L (ref 0–40)
Albumin/Globulin Ratio: 1.8 (ref 1.2–2.2)
Albumin: 4 g/dL (ref 3.9–4.9)
Alkaline Phosphatase: 172 IU/L — ABNORMAL HIGH (ref 44–121)
BUN/Creatinine Ratio: 23 (ref 12–28)
BUN: 18 mg/dL (ref 8–27)
Bilirubin Total: 0.9 mg/dL (ref 0.0–1.2)
CO2: 23 mmol/L (ref 20–29)
Calcium: 9.6 mg/dL (ref 8.7–10.3)
Chloride: 105 mmol/L (ref 96–106)
Creatinine, Ser: 0.78 mg/dL (ref 0.57–1.00)
Globulin, Total: 2.2 g/dL (ref 1.5–4.5)
Glucose: 132 mg/dL — ABNORMAL HIGH (ref 70–99)
Potassium: 4.3 mmol/L (ref 3.5–5.2)
Sodium: 142 mmol/L (ref 134–144)
Total Protein: 6.2 g/dL (ref 6.0–8.5)
eGFR: 86 mL/min/{1.73_m2} (ref >59)

## 2021-09-04 LAB — HEMOGLOBIN A1C
Est. average glucose Bld gHb Est-mCnc: 120 mg/dL
Hgb A1c MFr Bld: 5.8 % — ABNORMAL HIGH (ref 4.8–5.6)

## 2021-09-04 LAB — CARDIOVASCULAR RISK ASSESSMENT

## 2021-09-07 ENCOUNTER — Ambulatory Visit (INDEPENDENT_AMBULATORY_CARE_PROVIDER_SITE_OTHER): Payer: Medicare HMO | Admitting: Family Medicine

## 2021-09-07 VITALS — BP 92/64 | HR 84 | Temp 97.5°F | Resp 18 | Ht 65.0 in | Wt 275.0 lb

## 2021-09-07 DIAGNOSIS — J418 Mixed simple and mucopurulent chronic bronchitis: Secondary | ICD-10-CM | POA: Diagnosis not present

## 2021-09-07 DIAGNOSIS — G8929 Other chronic pain: Secondary | ICD-10-CM

## 2021-09-07 DIAGNOSIS — I152 Hypertension secondary to endocrine disorders: Secondary | ICD-10-CM

## 2021-09-07 DIAGNOSIS — G4733 Obstructive sleep apnea (adult) (pediatric): Secondary | ICD-10-CM | POA: Diagnosis not present

## 2021-09-07 DIAGNOSIS — I5022 Chronic systolic (congestive) heart failure: Secondary | ICD-10-CM

## 2021-09-07 DIAGNOSIS — J9611 Chronic respiratory failure with hypoxia: Secondary | ICD-10-CM | POA: Diagnosis not present

## 2021-09-07 DIAGNOSIS — M25511 Pain in right shoulder: Secondary | ICD-10-CM | POA: Diagnosis not present

## 2021-09-07 DIAGNOSIS — E1159 Type 2 diabetes mellitus with other circulatory complications: Secondary | ICD-10-CM | POA: Diagnosis not present

## 2021-09-07 DIAGNOSIS — E782 Mixed hyperlipidemia: Secondary | ICD-10-CM

## 2021-09-07 DIAGNOSIS — F330 Major depressive disorder, recurrent, mild: Secondary | ICD-10-CM | POA: Diagnosis not present

## 2021-09-07 LAB — VITAMIN D 25 HYDROXY (VIT D DEFICIENCY, FRACTURES)

## 2021-09-07 LAB — TSH: TSH: 2.06 u[IU]/mL (ref 0.450–4.500)

## 2021-09-07 NOTE — Progress Notes (Signed)
Thyroid function therapeutic. Continue current dose of synthroid.

## 2021-09-07 NOTE — Progress Notes (Unsigned)
s  Subjective:  Patient ID: Judy [name], female    DOB: January 31, 1961  Age: 61 y.o. MRN: [id number]  Chief Complaint  Patient presents with   Diabetes   COPD   HPI Diabetes:  Complications: hyperlipidemia Glucose checking: noot checking.  Most recent A1C: 5.9 Current medications: Currently taking Farxiga 10 mg daily, Trulicity Inject 3 mg into skin weekly Last Eye Exam:03/01/2021 Foot checks: Checks feet daily  Hyperlipidemia: Current medications: atorvastatin 20 mg 1 tablet daily, Vascepa 2 g BID, Coq10  Hypertensive CHF secondary to NICM: Current medications: Patient is taking aspirin 81 mg daily, carvedilol 6.25 mg BID, Entresto 24-26 mg 1 tablet BID. Pt sees Dr. Armanda Magic. NYHA class 2B. EF has significantly improved with entresto from 35% in 08/2014 to 55% in 06/2017. LHC done in 2016 was normal.  Hypothyroidism:  Patient is currently taking Levothyroxine 75 mcg take 1 tablet daily.  Generalized Anxiety disorder: Patient is currently taking Wellbutrin 300 mg daily, Clonazepam 0.5 mg  BID PRN. Patient denies depression.     09/07/2021    3:48 PM 04/23/2021   11:07 AM 01/26/2021    2:43 PM  PHQ9 SCORE ONLY  PHQ-9 Total Score 0 0 0     COPD/Chronic respiratory failure with hypoxia/OSA: Pt sees Dr. Sherene Sires. Currently on stiolto. ON nocturnal oxygen 2 L. Using oxygen when going outside and with exertion.Wears CPAP.  Diet: healthy Exercise: Patient has been exercising more. She is walking with a special cane that forces her to stand straight. Her breathing is much better with this special cane.  Chronic back pain: Flatback syndrome. ON tizanidine 4 mg every 6 hours as needed for back spasm. Meloxicam 15 mg daily. On tramadol 50 mg q6 hours as needed. Taking about tramadol 1-2 per day at the most. On gabapentin 800 mg three times a day. S/P shoulder surgery.  Patient is having right arm pain feels like a tooth ache.   Current Outpatient Medications on File Prior to Visit   Medication Sig Dispense Refill   albuterol (VENTOLIN HFA) 108 (90 Base) MCG/ACT inhaler Inhale 2 puffs into the lungs every 6 (six) hours as needed for wheezing or shortness of breath. 8 g 2   aspirin 81 MG tablet Take 81 mg by mouth at bedtime.      atorvastatin (LIPITOR) 20 MG tablet TAKE 1 TABLET EVERY DAY 90 tablet 0   buPROPion (WELLBUTRIN XL) 300 MG 24 hr tablet TAKE 1 TABLET EVERY DAY 90 tablet 3   carvedilol (COREG) 6.25 MG tablet TAKE 1 TABLET TWICE DAILY 180 tablet 2   clonazePAM (KLONOPIN) 0.5 MG tablet Take 1 tablet (0.5 mg total) by mouth 2 (two) times daily as needed for anxiety (panic attacks). 180 tablet 0   Coenzyme Q10 (CO Q 10) 100 MG CAPS Take 200 mg by mouth daily.     diphenhydrAMINE (BENADRYL) 25 MG tablet Take 50 mg by mouth at bedtime as needed.     Dulaglutide (TRULICITY) 3 MG/0.5ML SOPN Inject 3 mg as directed once a week. 2 mL 0   FARXIGA 10 MG TABS tablet Take 1 tablet (10 mg total) by mouth daily. 90 tablet 3   fluticasone (FLONASE) 50 MCG/ACT nasal spray Place 2 sprays into both nostrils daily. 16 g 6   gabapentin (NEURONTIN) 800 MG tablet TAKE 1 TABLET THREE TIMES DAILY 270 tablet 1   icosapent Ethyl (VASCEPA) 1 g capsule Take 2 capsules (2 g total) by mouth 2 (two) times daily. 120 capsule 2  levothyroxine (SYNTHROID) 75 MCG tablet TAKE 1 TABLET (75 MCG TOTAL) BY MOUTH DAILY. 90 tablet 1   Lutein 20 MG TABS Take 1 tablet by mouth daily.     meloxicam (MOBIC) 15 MG tablet Take 1 tablet (15 mg total) by mouth daily. 90 tablet 3   Multiple Vitamin (MULTIVITAMIN WITH MINERALS) TABS tablet Take 1 tablet by mouth daily.     ondansetron (ZOFRAN-ODT) 4 MG disintegrating tablet Take 1 tablet (4 mg total) by mouth every 8 (eight) hours as needed for nausea or vomiting. 20 tablet 0   OXYGEN 2lpm 2 lpm as needed     sacubitril-valsartan (ENTRESTO) 24-26 MG Take 1 tablet by mouth 2 (two) times daily. 180 tablet 3   Tiotropium Bromide-Olodaterol (STIOLTO RESPIMAT) 2.5-2.5  MCG/ACT AERS INHALE 2 PUFFS INTO THE LUNGS DAILY. 12 g 5   tiZANidine (ZANAFLEX) 4 MG tablet TAKE 1 TABLET EVERY 6 HOURS AS NEEDED FOR MUSCLE SPASM(S) 360 tablet 1   traMADol (ULTRAM) 50 MG tablet Take 1 tablet (50 mg total) by mouth every 6 (six) hours as needed for severe pain. Take one four times a day as needed for pain 360 tablet 0   UNABLE TO FIND CPAP with o2 2lpm  DME- [facility]     Vitamin D, Ergocalciferol, (DRISDOL) 1.25 MG (50000 UNIT) CAPS capsule TAKE 1 CAPSULE EVERY 7 DAYS 12 capsule 3   No current facility-administered medications on file prior to visit.   Past Medical History:  Diagnosis Date   Anxiety    Back pain    Bilateral swelling of feet    Chronic combined systolic and diastolic CHF (congestive heart failure) (HCC) 10/07/2014   COPD (chronic obstructive pulmonary disease) (HCC)    DCM (dilated cardiomyopathy) (HCC) 04/25/2014   EF 28% by MRI despite maximum medical therapy   Depression    Diabetes mellitus without complication (HCC)    Epilepsy (HCC)    as a child   Fatty liver    Former tobacco use    Hyperlipidemia    Hypertension    Hypothyroidism    Joint pain    Leg swelling    Morbid obesity (HCC)    NICM (nonischemic cardiomyopathy) (HCC)    OSA (obstructive sleep apnea)    moderate with AHI 26/hr with oxygen desaturations as low as 70%   Scoliosis    Sleep apnea    SOB (shortness of breath)    Past Surgical History:  Procedure Laterality Date   ABDOMINAL HYSTERECTOMY     ankle artery involved cyst removal     CARDIAC CATHETERIZATION  05/14/2014   normal coronary arteries   CARPAL TUNNEL RELEASE     FASCIOTOMY     1993 for platar fasciitis   harrington rod scoliosis     1981   LEFT HEART CATHETERIZATION WITH CORONARY ANGIOGRAM N/A 05/14/2014   Procedure: LEFT HEART CATHETERIZATION WITH CORONARY ANGIOGRAM;  Surgeon: Iran Ouch, MD;  Location: [facility]ATH [facility];  Service: Cardiovascular;  Laterality: N/A;   ROTATOR CUFF REPAIR     UMBILICAL  HERNIA REPAIR     VESICOVAGINAL FISTULA CLOSURE W/ TAH      Family History  Problem Relation Age of Onset   Diabetes Mother    Hyperlipidemia Mother    Obesity Mother    Emphysema Father    Allergies Father    Heart failure Father    Heart disease Father 25       MI   Diabetes Father  Hyperlipidemia Father    Hypertension Father    Thyroid disease Father    Alcohol abuse Father    Obesity Father    Breast cancer Neg Hx    Social History   Socioeconomic History   Marital status: Married    Spouse name: Not on file   Number of children: Not on file   Years of education: Not on file   Highest education level: Not on file  Occupational History   Occupation: disabled  Tobacco Use   Smoking status: Former    Packs/day: 1.00    Years: 15.00    Total pack years: 15.00    Types: Cigarettes    Quit date: 02/14/2005    Years since quitting: 16.5   Smokeless tobacco: Never  Vaping Use   Vaping Use: Never used  Substance and Sexual Activity   Alcohol use: Yes    Comment: occ   Drug use: No   Sexual activity: Not on file  Other Topics Concern   Not on file  Social History Narrative   Lives in [place] with spouse and son.   Previously worked as a Comptroller         Social Determinants of Corporate investment banker Strain: High Risk (07/21/2021)   Overall Financial Resource Strain (CARDIA)    Difficulty of Paying Living Expenses: Hard  Food Insecurity: No Food Insecurity (05/14/2020)   Hunger Vital Sign    Worried About Running Out of Food in the Last Year: Never true    Ran Out of Food in the Last Year: Never true  Transportation Needs: No Transportation Needs (07/21/2021)   PRAPARE - Administrator, Civil Service (Medical): No    Lack of Transportation (Non-Medical): No  Physical Activity: Inactive (01/26/2021)   Exercise Vital Sign    Days of Exercise per Week: 0 days    Minutes of Exercise per Session: 0 min  Stress: Not on file   Social Connections: Not on file    Review of Systems  Constitutional:  Negative for chills, fatigue and fever.  HENT:  Negative for congestion, rhinorrhea and sore throat.   Respiratory:  Positive for shortness of breath. Negative for cough.   Cardiovascular:  Positive for chest pain and palpitations.  Gastrointestinal:  Positive for nausea. Negative for abdominal pain, constipation, diarrhea and vomiting.  Genitourinary:  Negative for dysuria and urgency.  Musculoskeletal:  Positive for arthralgias (hips and right shoulder) and back pain. Negative for myalgias.  Neurological:  Negative for dizziness, weakness, light-headedness and headaches.  Psychiatric/Behavioral:  Negative for agitation and dysphoric mood. The patient is not nervous/anxious.      Objective:  BP 92/64   Pulse 84   Temp (!) 97.5 F (36.4 C)   Resp 18   Ht 5\' 5"  (1.651 m)   Wt 275 lb (124.7 kg)   BMI 45.76 kg/m      09/07/2021    3:40 PM 06/07/2021    3:23 PM 05/03/2021    1:27 PM  BP/Weight  Systolic BP 92 124 136  Diastolic BP 64 74 78  Wt. (Lbs) 275 271 270  BMI 45.76 kg/m2 45.1 kg/m2 44.93 kg/m2    Physical Exam Vitals reviewed.  Constitutional:      Appearance: Normal appearance. She is obese.  Neck:     Vascular: No carotid bruit.  Cardiovascular:     Rate and Rhythm: Normal rate and regular rhythm.     Heart sounds: Normal  heart sounds.  Pulmonary:     Effort: Pulmonary effort is normal. No respiratory distress.     Breath sounds: Normal breath sounds.  Abdominal:     General: Abdomen is flat. Bowel sounds are normal.     Palpations: Abdomen is soft.     Tenderness: There is no abdominal tenderness.  Neurological:     Mental Status: She is alert and oriented to person, place, and time.  Psychiatric:        Mood and Affect: Mood normal.        Behavior: Behavior normal.     Diabetic Foot Exam - Simple   No data filed      Lab Results  Component Value Date   WBC 8.0  09/03/2021   HGB 14.4 09/03/2021   HCT 42.3 09/03/2021   PLT 234 09/03/2021   GLUCOSE 132 (H) 09/03/2021   CHOL 131 09/03/2021   TRIG 154 (H) 09/03/2021   HDL 40 09/03/2021   LDLCALC 64 09/03/2021   ALT 23 09/03/2021   AST 18 09/03/2021   NA 142 09/03/2021   K 4.3 09/03/2021   CL 105 09/03/2021   CREATININE 0.78 09/03/2021   BUN 18 09/03/2021   CO2 23 09/03/2021   TSH 2.060 09/03/2021   INR 1.0 05/13/2014   HGBA1C 5.8 (H) 09/03/2021   MICROALBUR Negative 09/03/2019      Assessment & Plan:   Problem List Items Addressed This Visit   None .  No orders of the defined types were placed in this encounter.   No orders of the defined types were placed in this encounter.    Follow-up: No follow-ups on file.  An After Visit Summary was printed and given to the patient.  Blane Ohara, MD [facility] [phone]

## 2021-09-08 ENCOUNTER — Other Ambulatory Visit: Payer: Self-pay | Admitting: Physician Assistant

## 2021-09-08 ENCOUNTER — Encounter: Payer: Self-pay | Admitting: Family Medicine

## 2021-09-08 ENCOUNTER — Telehealth: Payer: Self-pay

## 2021-09-08 DIAGNOSIS — J449 Chronic obstructive pulmonary disease, unspecified: Secondary | ICD-10-CM | POA: Diagnosis not present

## 2021-09-08 DIAGNOSIS — J4520 Mild intermittent asthma, uncomplicated: Secondary | ICD-10-CM | POA: Diagnosis not present

## 2021-09-08 NOTE — Assessment & Plan Note (Signed)
Improved since surgery.  Continues to do home physical therapy exercises.  Recommend try tizanidine twice daily as has muscle pain medial to right scapula.

## 2021-09-08 NOTE — Assessment & Plan Note (Signed)
Recommend continue to work on eating healthy diet and exercise.  

## 2021-09-08 NOTE — Chronic Care Management (AMB) (Signed)
AZ&Me Notification:  Judy [name] 10 mg was shipped on 08/23/2021, allow 1-2 business days for shipment to arrive. Shipment was sent via [employer], Tracking number [id number].  Billee Cashing, CMA Clinical Pharmacist Assistant [phone]

## 2021-09-08 NOTE — Assessment & Plan Note (Signed)
Well controlled.  No changes to medicines.  Continue to work on eating a healthy diet and exercise.  Labs reviewed today 

## 2021-09-08 NOTE — Assessment & Plan Note (Signed)
BP is low normal. Patient feels fine.  Requested patient check bp twice daily.  May need to adjust entresto or carvedilol.  May discuss with cardiology prior to making changes.

## 2021-09-08 NOTE — Assessment & Plan Note (Signed)
Control: great Does not need to check sugars daily Recommend check feet daily. Recommend annual eye exams. Medicines: no changes.  Continue to work on eating a healthy diet and exercise.

## 2021-09-08 NOTE — Assessment & Plan Note (Signed)
The current medical regimen is effective;  continue present plan and medications.  

## 2021-09-08 NOTE — Assessment & Plan Note (Signed)
Continue to wear cpap with oxygen bled in at night.

## 2021-09-08 NOTE — Assessment & Plan Note (Signed)
The current medical regimen is effective;  continue present plan and medications. Worsened since smoke from [place].  Using oxygen more.

## 2021-09-08 NOTE — Assessment & Plan Note (Signed)
Continue oxygen at night and as needed during the day. Needing much more.  Has a portable concentrator.

## 2021-09-09 ENCOUNTER — Ambulatory Visit: Payer: Medicare HMO | Admitting: Internal Medicine

## 2021-09-22 ENCOUNTER — Encounter (INDEPENDENT_AMBULATORY_CARE_PROVIDER_SITE_OTHER): Payer: Self-pay

## 2021-09-27 ENCOUNTER — Telehealth: Payer: Self-pay

## 2021-09-27 NOTE — Progress Notes (Unsigned)
Chronic Care Management Pharmacy Assistant   Name: Judy [name]  MRN: [id number] DOB: [birth year]   Reason for Encounter: Medication Coordination for [employer]stream    Recent office visits:  09/07/21 Blane Ohara MD. Seen for OSA. D/C Fenofibrate 160mg  and Ondansetron 4mg .   Recent consult visits:  None  Hospital visits:  None  Medications: Outpatient Encounter Medications as of 09/27/2021  Medication Sig   albuterol (VENTOLIN HFA) 108 (90 Base) MCG/ACT inhaler Inhale 2 puffs into the lungs every 6 (six) hours as needed for wheezing or shortness of breath.   aspirin 81 MG tablet Take 81 mg by mouth at bedtime.    atorvastatin (LIPITOR) 20 MG tablet TAKE 1 TABLET EVERY DAY   buPROPion (WELLBUTRIN XL) 300 MG 24 hr tablet TAKE 1 TABLET EVERY DAY   carvedilol (COREG) 6.25 MG tablet TAKE 1 TABLET TWICE DAILY   clonazePAM (KLONOPIN) 0.5 MG tablet Take 1 tablet (0.5 mg total) by mouth 2 (two) times daily as needed for anxiety (panic attacks).   Coenzyme Q10 (CO Q 10) 100 MG CAPS Take 200 mg by mouth daily.   diphenhydrAMINE (BENADRYL) 25 MG tablet Take 50 mg by mouth at bedtime as needed.   Dulaglutide (TRULICITY) 3 MG/0.5ML SOPN Inject 3 mg as directed once a week.   FARXIGA 10 MG TABS tablet Take 1 tablet (10 mg total) by mouth daily.   fluticasone (FLONASE) 50 MCG/ACT nasal spray Place 2 sprays into both nostrils daily.   gabapentin (NEURONTIN) 800 MG tablet TAKE 1 TABLET THREE TIMES DAILY   icosapent Ethyl (VASCEPA) 1 g capsule Take 2 capsules (2 g total) by mouth 2 (two) times daily.   levothyroxine (SYNTHROID) 75 MCG tablet TAKE 1 TABLET (75 MCG TOTAL) BY MOUTH DAILY.   Lutein 20 MG TABS Take 1 tablet by mouth daily.   meloxicam (MOBIC) 15 MG tablet Take 1 tablet (15 mg total) by mouth daily.   Multiple Vitamin (MULTIVITAMIN WITH MINERALS) TABS tablet Take 1 tablet by mouth daily.   OXYGEN 2lpm 2 lpm as needed   sacubitril-valsartan (ENTRESTO) 24-26 MG Take 1 tablet by mouth 2  (two) times daily.   Tiotropium Bromide-Olodaterol (STIOLTO RESPIMAT) 2.5-2.5 MCG/ACT AERS INHALE 2 PUFFS INTO THE LUNGS DAILY.   tiZANidine (ZANAFLEX) 4 MG tablet TAKE 1 TABLET EVERY 6 HOURS AS NEEDED FOR MUSCLE SPASM(S)   traMADol (ULTRAM) 50 MG tablet Take 1 tablet (50 mg total) by mouth every 6 (six) hours as needed for severe pain. Take one four times a day as needed for pain   UNABLE TO FIND CPAP with o2 2lpm  DME- [facility]   Vitamin D, Ergocalciferol, (DRISDOL) 1.25 MG (50000 UNIT) CAPS capsule TAKE 1 CAPSULE EVERY 7 DAYS   No facility-administered encounter medications on file as of 09/27/2021.    Recent Relevant Labs: Lab Results  Component Value Date/Time   HGBA1C 5.8 (H) 09/03/2021 08:36 AM   HGBA1C 5.8 (H) 04/20/2021 09:09 AM   MICROALBUR Negative 09/03/2019 02:33 PM    Kidney Function Lab Results  Component Value Date/Time   CREATININE 0.78 09/03/2021 08:36 AM   CREATININE 0.93 04/20/2021 09:09 AM   CREATININE 0.75 12/09/2015 10:57 AM   CREATININE 0.69 10/23/2015 02:37 PM   GFR 100.87 10/01/2014 10:54 AM   GFRNONAA >60 02/27/2021 08:51 AM   GFRAA 87 04/06/2020 02:50 PM     Current antihyperglycemic regimen:  Trulicity 3 mg weekly (PAP) Farxiga 10 mg daily (PAP)  Patient verbally confirms she is taking the  above medications as directed. {yes/no:20286}  What recent interventions/DTPs have been made to improve glycemic control:  ***  Have there been any recent hospitalizations or ED visits since last visit with CPP? {yes/no:20286}  Patient {reports/denies:24182} hypoglycemic symptoms, including {Hypoglycemic Symptoms:3049003}  Patient {reports/denies:24182} hyperglycemic symptoms, including {symptoms; hyperglycemia:17903}  How often are you checking your blood sugar? {BG Testing frequency:23922}  What are your blood sugars ranging?  Fasting: *** Before meals: *** After meals: *** Bedtime: ***  On insulin? No  During the week, how often does your blood  glucose drop below 70? {LowBGfrequency:24142}  Are you checking your feet daily/regularly? {yes/no:20286}  Adherence Review: Is the patient currently on a STATIN medication? Yes Is the patient currently on ACE/ARB medication? Yes Does the patient have >5 day gap between last estimated fill dates? CPP to review  Care Gaps: Last eye exam / Retinopathy Screening? 03/01/21 Last Annual Wellness Visit? 01/26/21 Last Diabetic Foot Exam? 07/09/20   Star Rating Drugs:  Medication:  Last Fill: Day Supply Trulicity (PAP) Marcelline Deist (PAP)   Roxana Hires, CMA Clinical Pharmacist Assistant  [phone]

## 2021-10-09 DIAGNOSIS — J449 Chronic obstructive pulmonary disease, unspecified: Secondary | ICD-10-CM | POA: Diagnosis not present

## 2021-10-09 DIAGNOSIS — J4520 Mild intermittent asthma, uncomplicated: Secondary | ICD-10-CM | POA: Diagnosis not present

## 2021-10-11 ENCOUNTER — Other Ambulatory Visit: Payer: Self-pay | Admitting: Family Medicine

## 2021-10-11 ENCOUNTER — Telehealth (INDEPENDENT_AMBULATORY_CARE_PROVIDER_SITE_OTHER): Payer: Medicare HMO | Admitting: Family Medicine

## 2021-10-11 ENCOUNTER — Encounter: Payer: Self-pay | Admitting: Family Medicine

## 2021-10-11 VITALS — Ht 65.0 in | Wt 275.0 lb

## 2021-10-11 DIAGNOSIS — J9611 Chronic respiratory failure with hypoxia: Secondary | ICD-10-CM | POA: Diagnosis not present

## 2021-10-11 DIAGNOSIS — J410 Simple chronic bronchitis: Secondary | ICD-10-CM

## 2021-10-11 DIAGNOSIS — U071 COVID-19: Secondary | ICD-10-CM | POA: Diagnosis not present

## 2021-10-11 DIAGNOSIS — J208 Acute bronchitis due to other specified organisms: Secondary | ICD-10-CM | POA: Diagnosis not present

## 2021-10-11 MED ORDER — IPRATROPIUM-ALBUTEROL 0.5-2.5 (3) MG/3ML IN SOLN
3.0000 mL | RESPIRATORY_TRACT | 0 refills | Status: DC | PRN
Start: 2021-10-11 — End: 2023-01-31

## 2021-10-11 MED ORDER — NIRMATRELVIR/RITONAVIR (PAXLOVID)TABLET: 3.0000 | ORAL_TABLET | Freq: Two times a day (BID) | ORAL | 0 refills | Status: AC

## 2021-10-11 MED ORDER — ALBUTEROL SULFATE HFA 108 (90 BASE) MCG/ACT IN AERS: 2.0000 | INHALATION_SPRAY | Freq: Four times a day (QID) | RESPIRATORY_TRACT | 2 refills | Status: DC | PRN

## 2021-10-11 NOTE — Assessment & Plan Note (Signed)
Worsened. Wear oxygen 3 L while sick. Your COVID test is positive. You should remain isolated and quarantine for at least 5 days from start of symptoms. You must be feeling better and be fever free without any fever reducers for at least 24 hours as well. You should wear a mask at all times when out of your home or around others for 5 days after leaving isolation.  Your household contacts should be tested as well as work contacts. If you feel worse or have increasing shortness of breath, you should be seen in person at urgent care or the emergency room.

## 2021-10-11 NOTE — Assessment & Plan Note (Addendum)
Paxlovid , albuterol HFA Q6h PRN, Duoneb Q6h PRN were sent. Continue with Oxygen at 3 L.

## 2021-10-11 NOTE — Patient Instructions (Signed)
Your COVID test is positive. You should remain isolated and quarantine for at least 5 days from start of symptoms. You must be feeling better and be fever free without any fever reducers for at least 24 hours as well. You should wear a mask at all times when out of your home or around others for 5 days after leaving isolation.  Your household contacts should be tested as well as work contacts. If you feel worse or have increasing shortness of breath, you should be seen in person at urgent care or the emergency room.    Sent paxlovid. Use duoneb four times a day for 2 days then go back to as needed.   Call back if worsens and I will send steroids.

## 2021-10-11 NOTE — Progress Notes (Signed)
Virtual Visit via Video Note   This visit type was conducted due to national recommendations for restrictions regarding the COVID-19 Pandemic (e.g. social distancing) in an effort to limit this patient's exposure and mitigate transmission in our community.  Due to her co-morbid illnesses, this patient is at least at moderate risk for complications without adequate follow up.  This format is felt to be most appropriate for this patient at this time.  All issues noted in this document were discussed and addressed.  A limited physical exam was performed with this format.  A verbal consent was obtained for the virtual visit.   Date:  10/11/2021   ID:  Judy [name], DOB January 18, 1961, MRN [id number]  Patient Location: Home Provider Location: Office/Clinic  PCP:  Blane Ohara, MD   Evaluation Performed:  acute  Chief Complaint: Shortness of breath and cough.  History of Present Illness:    Judy [name] is a 61 y.o. female with shortness of breath, cough, sore throat and ear pain since 2 days ago. Patient went in a cruise 1 1/2 week ago when she got sick and test positive for Influenza A. They gave her Fluvir 75 mg twice daily, Zpack and cefdinir 300 mg BID.  Today, she did rapid covid test and it was positive. Her Oxygen went down to 86% without oxygen and 93% with Oxygen 2 L. She has a nebulizer at home, but is out of duoneb. She does not have a rescue inhaler. She is still taking her Stiolto and QVAR.  The patient does have symptoms concerning for COVID-19 infection (fever, chills, cough, or new shortness of breath).    Past Medical History:  Diagnosis Date   Anxiety    Back pain    Bilateral swelling of feet    Chronic combined systolic and diastolic CHF (congestive heart failure) (HCC) 10/07/2014   COPD (chronic obstructive pulmonary disease) (HCC)    DCM (dilated cardiomyopathy) (HCC) 04/25/2014   EF 28% by MRI despite maximum medical therapy   Depression    Diabetes mellitus  without complication (HCC)    Epilepsy (HCC)    as a child   Fatty liver    Former tobacco use    Hyperlipidemia    Hypertension    Hypothyroidism    Joint pain    Leg swelling    Morbid obesity (HCC)    NICM (nonischemic cardiomyopathy) (HCC)    OSA (obstructive sleep apnea)    moderate with AHI 26/hr with oxygen desaturations as low as 70%   Scoliosis    Sleep apnea    SOB (shortness of breath)     Past Surgical History:  Procedure Laterality Date   ABDOMINAL HYSTERECTOMY     ankle artery involved cyst removal     CARDIAC CATHETERIZATION  05/14/2014   normal coronary arteries   CARPAL TUNNEL RELEASE     FASCIOTOMY     1993 for platar fasciitis   harrington rod scoliosis     1981   LEFT HEART CATHETERIZATION WITH CORONARY ANGIOGRAM N/A 05/14/2014   Procedure: LEFT HEART CATHETERIZATION WITH CORONARY ANGIOGRAM;  Surgeon: Iran Ouch, MD;  Location: [facility];  Service: Cardiovascular;  Laterality: N/A;   ROTATOR CUFF REPAIR     UMBILICAL HERNIA REPAIR     VESICOVAGINAL FISTULA CLOSURE W/ TAH      Family History  Problem Relation Age of Onset   Diabetes Mother    Hyperlipidemia Mother    Obesity Mother  Emphysema Father    Allergies Father    Heart failure Father    Heart disease Father 52       MI   Diabetes Father    Hyperlipidemia Father    Hypertension Father    Thyroid disease Father    Alcohol abuse Father    Obesity Father    Breast cancer Neg Hx     Social History   Socioeconomic History   Marital status: Married    Spouse name: Not on file   Number of children: Not on file   Years of education: Not on file   Highest education level: Not on file  Occupational History   Occupation: disabled  Tobacco Use   Smoking status: Former    Packs/day: 1.00    Years: 15.00    Total pack years: 15.00    Types: Cigarettes    Quit date: 02/14/2005    Years since quitting: 16.6   Smokeless tobacco: Never  Vaping Use   Vaping Use: Never used   Substance and Sexual Activity   Alcohol use: Yes    Comment: occ   Drug use: No   Sexual activity: Not on file  Other Topics Concern   Not on file  Social History Narrative   Lives in [place] with spouse and son.   Previously worked as a Engineer, manufacturing         Social Determinants of Radio broadcast assistant Strain: High Risk (07/21/2021)   Overall Financial Resource Strain (CARDIA)    Difficulty of Paying Living Expenses: Hard  Food Insecurity: No Food Insecurity (05/14/2020)   Hunger Vital Sign    Worried About Running Out of Food in the Last Year: Never true    [place] in the Last Year: Never true  Transportation Needs: No Transportation Needs (07/21/2021)   PRAPARE - Hydrologist (Medical): No    Lack of Transportation (Non-Medical): No  Physical Activity: Inactive (01/26/2021)   Exercise Vital Sign    Days of Exercise per Week: 0 days    Minutes of Exercise per Session: 0 min  Stress: Not on file  Social Connections: Not on file  Intimate Partner Violence: Not At Risk (01/26/2021)   Humiliation, Afraid, Rape, and Kick questionnaire    Fear of Current or Ex-Partner: No    Emotionally Abused: No    Physically Abused: No    Sexually Abused: No    Outpatient Medications Prior to Visit  Medication Sig Dispense Refill   albuterol (VENTOLIN HFA) 108 (90 Base) MCG/ACT inhaler Inhale 2 puffs into the lungs every 6 (six) hours as needed for wheezing or shortness of breath. 8 g 2   aspirin 81 MG tablet Take 81 mg by mouth at bedtime.      atorvastatin (LIPITOR) 20 MG tablet TAKE 1 TABLET EVERY DAY 90 tablet 0   buPROPion (WELLBUTRIN XL) 300 MG 24 hr tablet TAKE 1 TABLET EVERY DAY 90 tablet 3   carvedilol (COREG) 6.25 MG tablet TAKE 1 TABLET TWICE DAILY 180 tablet 2   clonazePAM (KLONOPIN) 0.5 MG tablet Take 1 tablet (0.5 mg total) by mouth 2 (two) times daily as needed for anxiety (panic attacks). 180 tablet 0   Coenzyme Q10  (CO Q 10) 100 MG CAPS Take 200 mg by mouth daily.     diphenhydrAMINE (BENADRYL) 25 MG tablet Take 50 mg by mouth at bedtime as needed.     Dulaglutide (TRULICITY)  3 MG/0.5ML SOPN Inject 3 mg as directed once a week. 2 mL 0   FARXIGA 10 MG TABS tablet Take 1 tablet (10 mg total) by mouth daily. 90 tablet 3   fluticasone (FLONASE) 50 MCG/ACT nasal spray Place 2 sprays into both nostrils daily. 16 g 6   gabapentin (NEURONTIN) 800 MG tablet TAKE 1 TABLET THREE TIMES DAILY 270 tablet 1   icosapent Ethyl (VASCEPA) 1 g capsule Take 2 capsules (2 g total) by mouth 2 (two) times daily. 120 capsule 2   ipratropium-albuterol (DUONEB) 0.5-2.5 (3) MG/3ML SOLN Take 3 mLs by nebulization every 4 (four) hours as needed. 120 mL 0   levothyroxine (SYNTHROID) 75 MCG tablet TAKE 1 TABLET (75 MCG TOTAL) BY MOUTH DAILY. 90 tablet 3   Lutein 20 MG TABS Take 1 tablet by mouth daily.     meloxicam (MOBIC) 15 MG tablet Take 1 tablet (15 mg total) by mouth daily. 90 tablet 3   Multiple Vitamin (MULTIVITAMIN WITH MINERALS) TABS tablet Take 1 tablet by mouth daily.     nirmatrelvir/ritonavir EUA (PAXLOVID) 20 x 150 MG & 10 x 100MG  TABS Take 3 tablets by mouth 2 (two) times daily for 5 days. (Take nirmatrelvir 150 mg two tablets twice daily for 5 days and ritonavir 100 mg one tablet twice daily for 5 days) Patient GFR is 86 30 tablet 0   OXYGEN 2lpm 2 lpm as needed     sacubitril-valsartan (ENTRESTO) 24-26 MG Take 1 tablet by mouth 2 (two) times daily. 180 tablet 3   Tiotropium Bromide-Olodaterol (STIOLTO RESPIMAT) 2.5-2.5 MCG/ACT AERS INHALE 2 PUFFS INTO THE LUNGS DAILY. 12 g 5   tiZANidine (ZANAFLEX) 4 MG tablet TAKE 1 TABLET EVERY 6 HOURS AS NEEDED FOR MUSCLE SPASM(S) 360 tablet 1   traMADol (ULTRAM) 50 MG tablet Take 1 tablet (50 mg total) by mouth every 6 (six) hours as needed for severe pain. Take one four times a day as needed for pain 360 tablet 0   UNABLE TO FIND CPAP with o2 2lpm  DME- [facility]     Vitamin D,  Ergocalciferol, (DRISDOL) 1.25 MG (50000 UNIT) CAPS capsule TAKE 1 CAPSULE EVERY 7 DAYS 12 capsule 3   No facility-administered medications prior to visit.    Allergies:   Demerol [meperidine]   Social History   Tobacco Use   Smoking status: Former    Packs/day: 1.00    Years: 15.00    Total pack years: 15.00    Types: Cigarettes    Quit date: 02/14/2005    Years since quitting: 16.6   Smokeless tobacco: Never  Vaping Use   Vaping Use: Never used  Substance Use Topics   Alcohol use: Yes    Comment: occ   Drug use: No     Review of Systems  Constitutional:  Positive for malaise/fatigue. Negative for fever.  HENT:  Positive for ear pain.   Respiratory:  Positive for cough and shortness of breath.   Cardiovascular:  Negative for chest pain and palpitations.  Gastrointestinal:  Negative for nausea and vomiting.  Neurological:  Negative for headaches.     Labs/Other Tests and Data Reviewed:    Recent Labs: 09/03/2021: ALT 23; BUN 18; Creatinine, Ser 0.78; Hemoglobin 14.4; Platelets 234; Potassium 4.3; Sodium 142; TSH 2.060   Recent Lipid Panel Lab Results  Component Value Date/Time   CHOL 131 09/03/2021 08:36 AM   TRIG 154 (H) 09/03/2021 08:36 AM   HDL 40 09/03/2021 08:36 AM   CHOLHDL  3.3 09/03/2021 08:36 AM   CHOLHDL 3.6 07/12/2016 11:15 AM   LDLCALC 64 09/03/2021 08:36 AM    Wt Readings from Last 3 Encounters:  10/11/21 275 lb (124.7 kg)  09/07/21 275 lb (124.7 kg)  06/07/21 271 lb (122.9 kg)     Objective:    Vital Signs:  Ht 5\' 5"  (1.651 m)   Wt 275 lb (124.7 kg)   SpO2 (!) 86%   BMI 45.76 kg/m   O2 sat improved to 93% on 2 L Physical Exam Vitals reviewed.  HENT:     Mouth/Throat:     Comments: Hoarse, Neurological:     Mental Status: She is alert.      ASSESSMENT & PLAN:    Problem List Items Addressed This Visit       Respiratory   Chronic respiratory failure with hypoxia (HCC)    Worsened. Wear oxygen 3 L while sick. Your COVID test is  positive. You should remain isolated and quarantine for at least 5 days from start of symptoms. You must be feeling better and be fever free without any fever reducers for at least 24 hours as well. You should wear a mask at all times when out of your home or around others for 5 days after leaving isolation.  Your household contacts should be tested as well as work contacts. If you feel worse or have increasing shortness of breath, you should be seen in person at urgent care or the emergency room.          Acute bronchitis due to COVID-19 virus - Primary    Paxlovid , albuterol HFA Q6h PRN, Duoneb Q6h PRN were sent. Continue with Oxygen at 3 L.       Simple chronic bronchitis (HCC)    Continue qvar and stiolto.  Recommend use duoneb every 6 hours as needed.      COVID-19 Education: The signs and symptoms of COVID-19 were discussed with the patient and how to seek care for testing (follow up with PCP or arrange E-visit). The importance of social distancing was discussed today.   I spent 10 minutes dedicated to the care of this patient on the date of this encounter to include face-to-face time with the patient.  Follow Up:  In Person prn  Signed, Marland Kitchen, MD  10/11/2021 1:13 PM    [name] [place]

## 2021-10-11 NOTE — Assessment & Plan Note (Signed)
Continue qvar and stiolto.  Recommend use duoneb every 6 hours as needed.

## 2021-10-12 ENCOUNTER — Ambulatory Visit: Payer: Medicare HMO | Admitting: Family Medicine

## 2021-10-28 ENCOUNTER — Other Ambulatory Visit: Payer: Self-pay | Admitting: Family Medicine

## 2021-10-28 DIAGNOSIS — J3089 Other allergic rhinitis: Secondary | ICD-10-CM

## 2021-11-01 ENCOUNTER — Telehealth: Payer: Self-pay

## 2021-11-01 NOTE — Chronic Care Management (AMB) (Unsigned)
AZ&Me Notification:  Wilder Glade 10 mg was shipped on 10/19/2021, allow 1-2 business days for shipment to arrive. Shipment was sent via [employer], Tracking number [id number].  Refill request sent to clinical team for 90 day supply with 3 refills on Farxiga sent to [facility].  Pattricia Boss, [place] Pharmacist Assistant [phone]

## 2021-11-02 ENCOUNTER — Other Ambulatory Visit: Payer: Self-pay

## 2021-11-02 ENCOUNTER — Ambulatory Visit (INDEPENDENT_AMBULATORY_CARE_PROVIDER_SITE_OTHER): Payer: Medicare HMO | Admitting: Family Medicine

## 2021-11-02 VITALS — BP 140/80 | HR 86 | Temp 97.1°F | Resp 18 | Ht 65.0 in | Wt 275.0 lb

## 2021-11-02 DIAGNOSIS — J449 Chronic obstructive pulmonary disease, unspecified: Secondary | ICD-10-CM | POA: Diagnosis not present

## 2021-11-02 DIAGNOSIS — R0902 Hypoxemia: Secondary | ICD-10-CM | POA: Diagnosis not present

## 2021-11-02 DIAGNOSIS — J9611 Chronic respiratory failure with hypoxia: Secondary | ICD-10-CM

## 2021-11-02 DIAGNOSIS — G4733 Obstructive sleep apnea (adult) (pediatric): Secondary | ICD-10-CM | POA: Diagnosis not present

## 2021-11-02 DIAGNOSIS — J418 Mixed simple and mucopurulent chronic bronchitis: Secondary | ICD-10-CM | POA: Diagnosis not present

## 2021-11-02 MED ORDER — FARXIGA 10 MG PO TABS: 10.0000 mg | ORAL_TABLET | Freq: Every day | ORAL | 3 refills | Status: DC

## 2021-11-02 MED ORDER — PREDNISONE 50 MG PO TABS: 50.0000 mg | ORAL_TABLET | Freq: Every day | ORAL | 0 refills | Status: DC

## 2021-11-02 NOTE — Progress Notes (Signed)
Acute Office Visit  Subjective:    Patient ID: Judy [name], female    DOB: August 20, 1960, 61 y.o.   MRN: [id number]  Chief Complaint  Patient presents with   Shortness of Breath    HPI: Patient is a 61 yo WF with COPD, CONGESTIVE HEART FAILURE, and chronic respiratory failure with recent covid 19 and flu infection. She received tamiflu and covid 19 medicine. No steroids or antibiotics were given. She is feeling better, but patient is in today for persistent DYSPNEA ON EXERTION which is worse than her baseline. Oxygen is dipping down into mid 80s whether she wears O2 2 L or is on RA. Not using duoneb treatments. ON stiolto.   Past Medical History:  Diagnosis Date   Anxiety    Back pain    Bilateral swelling of feet    Chronic combined systolic and diastolic CHF (congestive heart failure) ([place]) 10/07/2014   COPD (chronic obstructive pulmonary disease) (HCC)    DCM (dilated cardiomyopathy) ([place]) 04/25/2014   EF 28% by MRI despite maximum medical therapy   Depression    Diabetes mellitus without complication ([place])    Epilepsy ([place])    as a child   Fatty liver    Former tobacco use    Hyperlipidemia    Hypertension    Hypothyroidism    Joint pain    Leg swelling    Morbid obesity (HCC)    NICM (nonischemic cardiomyopathy) (HCC)    OSA (obstructive sleep apnea)    moderate with AHI 26/hr with oxygen desaturations as low as 70%   Scoliosis    Sleep apnea    SOB (shortness of breath)     Past Surgical History:  Procedure Laterality Date   ABDOMINAL HYSTERECTOMY     ankle artery involved cyst removal     CARDIAC CATHETERIZATION  05/14/2014   normal coronary arteries   CARPAL TUNNEL RELEASE     FASCIOTOMY     1993 for platar fasciitis   harrington rod scoliosis     1981   LEFT HEART CATHETERIZATION WITH CORONARY ANGIOGRAM N/A 05/14/2014   Procedure: LEFT HEART CATHETERIZATION WITH CORONARY ANGIOGRAM;  Surgeon: Wellington [place], MD;  Location: [facility];  Service:  Cardiovascular;  Laterality: N/A;   ROTATOR CUFF REPAIR     UMBILICAL HERNIA REPAIR     VESICOVAGINAL FISTULA CLOSURE W/ TAH      Family History  Problem Relation Age of Onset   Diabetes Mother    Hyperlipidemia Mother    Obesity Mother    Emphysema Father    Allergies Father    Heart failure Father    Heart disease Father 64       MI   Diabetes Father    Hyperlipidemia Father    Hypertension Father    Thyroid disease Father    Alcohol abuse Father    Obesity Father    Breast cancer Neg Hx     Social History   Socioeconomic History   Marital status: Married    Spouse name: Not on file   Number of children: Not on file   Years of education: Not on file   Highest education level: Not on file  Occupational History   Occupation: disabled  Tobacco Use   Smoking status: Former    Packs/day: 1.00    Years: 15.00    Total pack years: 15.00    Types: Cigarettes    Quit date: 02/14/2005    Years since quitting:  16.7   Smokeless tobacco: Never  Vaping Use   Vaping Use: Never used  Substance and Sexual Activity   Alcohol use: Yes    Comment: occ   Drug use: No   Sexual activity: Not on file  Other Topics Concern   Not on file  Social History Narrative   Lives in [place] with spouse and son.   Previously worked as a Engineer, manufacturing         Social Determinants of Radio broadcast assistant Strain: High Risk (07/21/2021)   Overall Financial Resource Strain (CARDIA)    Difficulty of Paying Living Expenses: Hard  Food Insecurity: No Food Insecurity (05/14/2020)   Hunger Vital Sign    Worried About Running Out of Food in the Last Year: Never true    [place] in the Last Year: Never true  Transportation Needs: No Transportation Needs (07/21/2021)   PRAPARE - Hydrologist (Medical): No    Lack of Transportation (Non-Medical): No  Physical Activity: Inactive (01/26/2021)   Exercise Vital Sign    Days of Exercise per  Week: 0 days    Minutes of Exercise per Session: 0 min  Stress: Not on file  Social Connections: Not on file  Intimate Partner Violence: Not At Risk (01/26/2021)   Humiliation, Afraid, Rape, and Kick questionnaire    Fear of Current or Ex-Partner: No    Emotionally Abused: No    Physically Abused: No    Sexually Abused: No    Outpatient Medications Prior to Visit  Medication Sig Dispense Refill   albuterol (VENTOLIN HFA) 108 (90 Base) MCG/ACT inhaler Inhale 2 puffs into the lungs every 6 (six) hours as needed for wheezing or shortness of breath. 8 g 2   aspirin 81 MG tablet Take 81 mg by mouth at bedtime.      atorvastatin (LIPITOR) 20 MG tablet TAKE 1 TABLET EVERY DAY 90 tablet 0   buPROPion (WELLBUTRIN XL) 300 MG 24 hr tablet TAKE 1 TABLET EVERY DAY 90 tablet 3   carvedilol (COREG) 6.25 MG tablet TAKE 1 TABLET TWICE DAILY 180 tablet 2   clonazePAM (KLONOPIN) 0.5 MG tablet Take 1 tablet (0.5 mg total) by mouth 2 (two) times daily as needed for anxiety (panic attacks). 180 tablet 0   Coenzyme Q10 (CO Q 10) 100 MG CAPS Take 200 mg by mouth daily.     diphenhydrAMINE (BENADRYL) 25 MG tablet Take 50 mg by mouth at bedtime as needed.     Dulaglutide (TRULICITY) 3 WP/8.0DX SOPN Inject 3 mg as directed once a week. 2 mL 0   fluticasone (FLONASE) 50 MCG/ACT nasal spray SPRAY 2 SPRAYS INTO EACH NOSTRIL EVERY DAY 48 mL 2   gabapentin (NEURONTIN) 800 MG tablet TAKE 1 TABLET THREE TIMES DAILY 270 tablet 1   icosapent Ethyl (VASCEPA) 1 g capsule Take 2 capsules (2 g total) by mouth 2 (two) times daily. 120 capsule 2   ipratropium-albuterol (DUONEB) 0.5-2.5 (3) MG/3ML SOLN Take 3 mLs by nebulization every 4 (four) hours as needed. 120 mL 0   levothyroxine (SYNTHROID) 75 MCG tablet TAKE 1 TABLET (75 MCG TOTAL) BY MOUTH DAILY. 90 tablet 3   Lutein 20 MG TABS Take 1 tablet by mouth daily.     meloxicam (MOBIC) 15 MG tablet Take 1 tablet (15 mg total) by mouth daily. 90 tablet 3   Multiple Vitamin  (MULTIVITAMIN WITH MINERALS) TABS tablet Take 1 tablet by mouth daily.  OXYGEN 2lpm 2 lpm as needed     sacubitril-valsartan (ENTRESTO) 24-26 MG Take 1 tablet by mouth 2 (two) times daily. 180 tablet 3   Tiotropium Bromide-Olodaterol (STIOLTO RESPIMAT) 2.5-2.5 MCG/ACT AERS INHALE 2 PUFFS INTO THE LUNGS DAILY. 12 g 5   tiZANidine (ZANAFLEX) 4 MG tablet TAKE 1 TABLET EVERY 6 HOURS AS NEEDED FOR MUSCLE SPASM(S) 360 tablet 1   traMADol (ULTRAM) 50 MG tablet Take 1 tablet (50 mg total) by mouth every 6 (six) hours as needed for severe pain. Take one four times a day as needed for pain 360 tablet 0   UNABLE TO FIND CPAP with o2 2lpm  DME- [facility]     Vitamin D, Ergocalciferol, (DRISDOL) 1.25 MG (50000 UNIT) CAPS capsule TAKE 1 CAPSULE EVERY 7 DAYS 12 capsule 3   FARXIGA 10 MG TABS tablet Take 1 tablet (10 mg total) by mouth daily. 90 tablet 3   No facility-administered medications prior to visit.    Allergies  Allergen Reactions   Demerol [Meperidine] Nausea And Vomiting    Review of Systems  Constitutional:  Positive for fatigue and fever. Negative for chills.  HENT:  Positive for ear pain.   Respiratory:  Positive for cough and shortness of breath. Negative for wheezing.   Cardiovascular:  Positive for palpitations. Negative for chest pain and leg swelling.  Neurological:  Positive for dizziness and headaches.       Objective:    Physical Exam Vitals reviewed.  Constitutional:      Appearance: She is well-developed. She is obese.  Cardiovascular:     Rate and Rhythm: Normal rate and regular rhythm.     Heart sounds: Normal heart sounds.  Pulmonary:     Breath sounds: No wheezing or rales.     Comments: Restricted air flow.  Neurological:     Mental Status: She is alert.  Psychiatric:        Mood and Affect: Mood normal.        Behavior: Behavior normal.     BP (!) 140/80   Pulse 86   Temp (!) 97.1 F (36.2 C)   Resp 18   Ht '5\' 5"'  (1.651 m)   Wt 275 lb (124.7 kg)    SpO2 90%   BMI 45.76 kg/m  Wt Readings from Last 3 Encounters:  11/02/21 275 lb (124.7 kg)  10/11/21 275 lb (124.7 kg)  09/07/21 275 lb (124.7 kg)    Health Maintenance Due  Topic Date Due   Hepatitis C Screening  Never done   Zoster Vaccines- Shingrix (1 of 2) Never done   Diabetic kidney evaluation - Urine ACR  04/10/2019   COVID-19 Vaccine (5 - Pfizer risk series) 01/05/2021   FOOT EXAM  07/09/2021   INFLUENZA VACCINE  09/14/2021    There are no preventive care reminders to display for this patient.   Lab Results  Component Value Date   TSH 2.060 09/03/2021   Lab Results  Component Value Date   WBC 8.0 09/03/2021   HGB 14.4 09/03/2021   HCT 42.3 09/03/2021   MCV 92 09/03/2021   PLT 234 09/03/2021   Lab Results  Component Value Date   NA 142 09/03/2021   K 4.3 09/03/2021   CO2 23 09/03/2021   GLUCOSE 132 (H) 09/03/2021   BUN 18 09/03/2021   CREATININE 0.78 09/03/2021   BILITOT 0.9 09/03/2021   ALKPHOS 172 (H) 09/03/2021   AST 18 09/03/2021   ALT 23 09/03/2021   PROT  6.2 09/03/2021   ALBUMIN 4.0 09/03/2021   CALCIUM 9.6 09/03/2021   ANIONGAP 11 02/27/2021   EGFR 86 09/03/2021   GFR 100.87 10/01/2014   Lab Results  Component Value Date   CHOL 131 09/03/2021   Lab Results  Component Value Date   HDL 40 09/03/2021   Lab Results  Component Value Date   LDLCALC 64 09/03/2021   Lab Results  Component Value Date   TRIG 154 (H) 09/03/2021   Lab Results  Component Value Date   CHOLHDL 3.3 09/03/2021   Lab Results  Component Value Date   HGBA1C 5.8 (H) 09/03/2021       Assessment & Plan:   Problem List Items Addressed This Visit       Respiratory   Chronic respiratory failure with hypoxia (HCC)    Increase oxygen to 3-5 L.       COPD (chronic obstructive pulmonary disease) ([place])    I discussed with Dr. Melvyn Novas.  Rx: Prednisone.  DUoneb given. PFs 150 average before and after duoneb treatment.      Relevant Medications   predniSONE  (DELTASONE) 50 MG tablet   ipratropium-albuterol (DUONEB) 0.5-2.5 (3) MG/3ML nebulizer solution 3 mL (Start on 11/07/2021  5:00 PM)   OSA (obstructive sleep apnea)    Continue cpap with oxygen.      Other Visit Diagnoses     Hypoxia    -  Primary   Relevant Orders   Peak flow meter (Completed)      Meds ordered this encounter  Medications   predniSONE (DELTASONE) 50 MG tablet    Sig: Take 1 tablet (50 mg total) by mouth daily with breakfast.    Dispense:  5 tablet    Refill:  0   ipratropium-albuterol (DUONEB) 0.5-2.5 (3) MG/3ML nebulizer solution 3 mL    Orders Placed This Encounter  Procedures   Peak flow meter     Follow-up: Return if symptoms worsen or fail to improve pt was recommended to call Dr. Gustavus Bryant office in 2 weeks.  An After Visit Summary was printed and given to the patient.  Rochel Brome, MD [facility] [phone]

## 2021-11-07 ENCOUNTER — Encounter: Payer: Self-pay | Admitting: Family Medicine

## 2021-11-07 MED ORDER — IPRATROPIUM-ALBUTEROL 0.5-2.5 (3) MG/3ML IN SOLN: 3.0000 mL | Freq: Once | RESPIRATORY_TRACT | Status: DC

## 2021-11-07 NOTE — Assessment & Plan Note (Signed)
Continue cpap with oxygen °

## 2021-11-07 NOTE — Assessment & Plan Note (Signed)
I discussed with Dr. Melvyn Novas.  Rx: Prednisone.  DUoneb given. PFs 150 average before and after duoneb treatment.

## 2021-11-07 NOTE — Assessment & Plan Note (Signed)
Increase oxygen to 3-5 L.

## 2021-12-01 DIAGNOSIS — K219 Gastro-esophageal reflux disease without esophagitis: Secondary | ICD-10-CM | POA: Diagnosis not present

## 2021-12-01 DIAGNOSIS — K649 Unspecified hemorrhoids: Secondary | ICD-10-CM | POA: Diagnosis not present

## 2021-12-01 DIAGNOSIS — R1310 Dysphagia, unspecified: Secondary | ICD-10-CM | POA: Diagnosis not present

## 2021-12-13 ENCOUNTER — Other Ambulatory Visit: Payer: Self-pay | Admitting: Family Medicine

## 2021-12-13 ENCOUNTER — Other Ambulatory Visit: Payer: Medicare HMO

## 2021-12-13 DIAGNOSIS — E038 Other specified hypothyroidism: Secondary | ICD-10-CM

## 2021-12-13 DIAGNOSIS — E782 Mixed hyperlipidemia: Secondary | ICD-10-CM | POA: Diagnosis not present

## 2021-12-13 DIAGNOSIS — E1165 Type 2 diabetes mellitus with hyperglycemia: Secondary | ICD-10-CM | POA: Diagnosis not present

## 2021-12-13 DIAGNOSIS — E7849 Other hyperlipidemia: Secondary | ICD-10-CM

## 2021-12-14 LAB — CBC
Hematocrit: 41.1 % (ref 34.0–46.6)
Hemoglobin: 13.9 g/dL (ref 11.1–15.9)
MCH: 31.3 pg (ref 26.6–33.0)
MCHC: 33.8 g/dL (ref 31.5–35.7)
MCV: 93 fL (ref 79–97)
Platelets: 225 10*3/uL (ref 150–450)
RBC: 4.44 x10E6/uL (ref 3.77–5.28)
RDW: 12.7 % (ref 11.7–15.4)
WBC: 5.7 10*3/uL (ref 3.4–10.8)

## 2021-12-14 LAB — LIPID PANEL
Chol/HDL Ratio: 3.1 ratio (ref 0.0–4.4)
Cholesterol, Total: 130 mg/dL (ref 100–199)
HDL: 42 mg/dL (ref >39)
LDL Chol Calc (NIH): 65 mg/dL (ref 0–99)
Triglycerides: 128 mg/dL (ref 0–149)
VLDL Cholesterol Cal: 23 mg/dL (ref 5–40)

## 2021-12-14 LAB — COMPREHENSIVE METABOLIC PANEL
ALT: 43 IU/L — ABNORMAL HIGH (ref 0–32)
AST: 22 IU/L (ref 0–40)
Albumin/Globulin Ratio: 2 (ref 1.2–2.2)
Albumin: 4 g/dL (ref 3.9–4.9)
Alkaline Phosphatase: 149 IU/L — ABNORMAL HIGH (ref 44–121)
BUN/Creatinine Ratio: 18 (ref 12–28)
BUN: 14 mg/dL (ref 8–27)
Bilirubin Total: 0.7 mg/dL (ref 0.0–1.2)
CO2: 24 mmol/L (ref 20–29)
Calcium: 9.2 mg/dL (ref 8.7–10.3)
Chloride: 109 mmol/L — ABNORMAL HIGH (ref 96–106)
Creatinine, Ser: 0.76 mg/dL (ref 0.57–1.00)
Globulin, Total: 2 g/dL (ref 1.5–4.5)
Glucose: 78 mg/dL (ref 70–99)
Potassium: 4.3 mmol/L (ref 3.5–5.2)
Sodium: 146 mmol/L — ABNORMAL HIGH (ref 134–144)
Total Protein: 6 g/dL (ref 6.0–8.5)
eGFR: 89 mL/min/{1.73_m2} (ref >59)

## 2021-12-14 LAB — TSH: TSH: 1.4 u[IU]/mL (ref 0.450–4.500)

## 2021-12-14 LAB — HEMOGLOBIN A1C
Est. average glucose Bld gHb Est-mCnc: 126 mg/dL
Hgb A1c MFr Bld: 6 % — ABNORMAL HIGH (ref 4.8–5.6)

## 2021-12-14 LAB — CARDIOVASCULAR RISK ASSESSMENT

## 2021-12-16 ENCOUNTER — Ambulatory Visit (INDEPENDENT_AMBULATORY_CARE_PROVIDER_SITE_OTHER): Payer: Medicare HMO | Admitting: Family Medicine

## 2021-12-16 VITALS — BP 100/80 | HR 77 | Temp 98.6°F | Resp 14 | Ht 65.0 in | Wt 273.0 lb

## 2021-12-16 DIAGNOSIS — E559 Vitamin D deficiency, unspecified: Secondary | ICD-10-CM | POA: Diagnosis not present

## 2021-12-16 DIAGNOSIS — M25831 Other specified joint disorders, right wrist: Secondary | ICD-10-CM

## 2021-12-16 DIAGNOSIS — G4733 Obstructive sleep apnea (adult) (pediatric): Secondary | ICD-10-CM

## 2021-12-16 DIAGNOSIS — J449 Chronic obstructive pulmonary disease, unspecified: Secondary | ICD-10-CM | POA: Diagnosis not present

## 2021-12-16 DIAGNOSIS — M542 Cervicalgia: Secondary | ICD-10-CM

## 2021-12-16 DIAGNOSIS — E1169 Type 2 diabetes mellitus with other specified complication: Secondary | ICD-10-CM

## 2021-12-16 DIAGNOSIS — E785 Hyperlipidemia, unspecified: Secondary | ICD-10-CM

## 2021-12-16 DIAGNOSIS — I5022 Chronic systolic (congestive) heart failure: Secondary | ICD-10-CM

## 2021-12-16 DIAGNOSIS — F330 Major depressive disorder, recurrent, mild: Secondary | ICD-10-CM

## 2021-12-16 DIAGNOSIS — I110 Hypertensive heart disease with heart failure: Secondary | ICD-10-CM | POA: Diagnosis not present

## 2021-12-16 DIAGNOSIS — E782 Mixed hyperlipidemia: Secondary | ICD-10-CM | POA: Diagnosis not present

## 2021-12-16 DIAGNOSIS — J9611 Chronic respiratory failure with hypoxia: Secondary | ICD-10-CM

## 2021-12-16 DIAGNOSIS — E038 Other specified hypothyroidism: Secondary | ICD-10-CM

## 2021-12-16 DIAGNOSIS — Z23 Encounter for immunization: Secondary | ICD-10-CM

## 2021-12-16 DIAGNOSIS — F411 Generalized anxiety disorder: Secondary | ICD-10-CM

## 2021-12-16 DIAGNOSIS — G894 Chronic pain syndrome: Secondary | ICD-10-CM

## 2021-12-16 MED ORDER — MELOXICAM 15 MG PO TABS: 15.0000 mg | ORAL_TABLET | Freq: Every day | ORAL | 3 refills | Status: DC

## 2021-12-16 MED ORDER — VITAMIN D (ERGOCALCIFEROL) 1.25 MG (50000 UNIT) PO CAPS: ORAL_CAPSULE | ORAL | 3 refills | Status: DC

## 2021-12-16 NOTE — Progress Notes (Signed)
Blood count normal.  Liver function normal.  Kidney function normal.  Thyroid function normal.  Cholesterol: Excellent. HBA1C: 6!

## 2021-12-16 NOTE — Progress Notes (Signed)
Subjective:  Patient ID: Judy [name], female    DOB: 1960-10-13  Age: 61 y.o. MRN: [id number]  Chief Complaint  Patient presents with   Hyperlipidemia   Diabetes    HPI  Diabetes:  Complications: hyperlipidemia Glucose checking: not checking Most recent A1C: 6.0 Current medications: Farxiga 10 mg once daily, Trulicity 3 mg once daily Last Eye Exam:03/01/21 Foot checks: ye Lifestyle changes: Eating fairly healthy. Unable to exercise due to back pain.   Hyperlipidemia: Current medications: lipitor, vascepa, coenzyme Q 10 daily Labs reviewed. LDL at goal.   Hypertension with chf due to idiopathic cardiomyopathy. Current medications: Coreg 6.25 mg twice daily, Entresto 24-26 mg twice daily., aspirin,  Lifestyle changes: eats healthy. Unable to exercise.   Chronic pain syndrome: lumbar back pain secondary to severe scoliosis and harrington rod complication. Taking tramadol 50 mg one to two times per week and tylenol. Has muscle relaxant. Using TENS unit.  OSA/COPD: uses cpap. Compliant and benefits. On stiolto. Sees pulmonology. Continues to have shortness of breath with exertion. Worsened since she had COVID 19 end of 8/23 and early 9/23. Chronic Respiratory Failure with hypoxia. Wears oxygen 2 L daily.   Hypothyroidism -currently on synthroid 75 mcg  Depression, mild, recurrent- wellbutrin XL 300 mg once daily in a.m. clonazepam 0.5 mg 1 twice daily as needed for panic attacks.  Patient very rarely takes this   Current Outpatient Medications on File Prior to Visit  Medication Sig Dispense Refill   albuterol (VENTOLIN HFA) 108 (90 Base) MCG/ACT inhaler Inhale 2 puffs into the lungs every 6 (six) hours as needed for wheezing or shortness of breath. 8 g 2   aspirin 81 MG tablet Take 81 mg by mouth at bedtime.      atorvastatin (LIPITOR) 20 MG tablet TAKE 1 TABLET EVERY DAY 90 tablet 0   buPROPion (WELLBUTRIN XL) 300 MG 24 hr tablet TAKE 1 TABLET EVERY DAY 90 tablet 3    carvedilol (COREG) 6.25 MG tablet TAKE 1 TABLET TWICE DAILY 180 tablet 2   clonazePAM (KLONOPIN) 0.5 MG tablet Take 1 tablet (0.5 mg total) by mouth 2 (two) times daily as needed for anxiety (panic attacks). 180 tablet 0   Coenzyme Q10 (CO Q 10) 100 MG CAPS Take 200 mg by mouth daily.     diphenhydrAMINE (BENADRYL) 25 MG tablet Take 50 mg by mouth at bedtime as needed.     Dulaglutide (TRULICITY) 3 MG/0.5ML SOPN Inject 3 mg as directed once a week. 2 mL 0   FARXIGA 10 MG TABS tablet Take 1 tablet (10 mg total) by mouth daily. 90 tablet 3   fluticasone (FLONASE) 50 MCG/ACT nasal spray SPRAY 2 SPRAYS INTO EACH NOSTRIL EVERY DAY 48 mL 2   gabapentin (NEURONTIN) 800 MG tablet TAKE 1 TABLET THREE TIMES DAILY 270 tablet 1   ipratropium-albuterol (DUONEB) 0.5-2.5 (3) MG/3ML SOLN Take 3 mLs by nebulization every 4 (four) hours as needed. 120 mL 0   levothyroxine (SYNTHROID) 75 MCG tablet TAKE 1 TABLET (75 MCG TOTAL) BY MOUTH DAILY. 90 tablet 3   Lutein 20 MG TABS Take 1 tablet by mouth daily.     Multiple Vitamin (MULTIVITAMIN WITH MINERALS) TABS tablet Take 1 tablet by mouth daily.     OXYGEN 2lpm 2 lpm as needed     sacubitril-valsartan (ENTRESTO) 24-26 MG Take 1 tablet by mouth 2 (two) times daily. 180 tablet 3   Tiotropium Bromide-Olodaterol (STIOLTO RESPIMAT) 2.5-2.5 MCG/ACT AERS INHALE 2 PUFFS INTO THE LUNGS  DAILY. 12 g 5   tiZANidine (ZANAFLEX) 4 MG tablet TAKE 1 TABLET EVERY 6 HOURS AS NEEDED FOR MUSCLE SPASM(S) 360 tablet 1   traMADol (ULTRAM) 50 MG tablet Take 1 tablet (50 mg total) by mouth every 6 (six) hours as needed for severe pain. Take one four times a day as needed for pain 360 tablet 0   UNABLE TO FIND CPAP with o2 2lpm  DME- [facility]     VASCEPA 1 g capsule TAKE 2 CAPSULES BY MOUTH TWICE A DAY 360 capsule 1   Current Facility-Administered Medications on File Prior to Visit  Medication Dose Route Frequency Provider Last Rate Last Admin   ipratropium-albuterol (DUONEB) 0.5-2.5 (3) MG/3ML  nebulizer solution 3 mL  3 mL Nebulization Once Hoyt Leanos, Fritzi Mandes, MD       Past Medical History:  Diagnosis Date   Anxiety    Back pain    Bilateral swelling of feet    Chronic combined systolic and diastolic CHF (congestive heart failure) (HCC) 10/07/2014   COPD (chronic obstructive pulmonary disease) (HCC)    DCM (dilated cardiomyopathy) (HCC) 04/25/2014   EF 28% by MRI despite maximum medical therapy   Depression    Diabetes mellitus without complication (HCC)    Epilepsy (HCC)    as a child   Fatty liver    Former tobacco use    Hyperlipidemia    Hypertension    Hypothyroidism    Joint pain    Leg swelling    Morbid obesity (HCC)    NICM (nonischemic cardiomyopathy) (HCC)    OSA (obstructive sleep apnea)    moderate with AHI 26/hr with oxygen desaturations as low as 70%   Scoliosis    Sleep apnea    SOB (shortness of breath)    Past Surgical History:  Procedure Laterality Date   ABDOMINAL HYSTERECTOMY     ankle artery involved cyst removal     CARDIAC CATHETERIZATION  05/14/2014   normal coronary arteries   CARPAL TUNNEL RELEASE     FASCIOTOMY     1993 for platar fasciitis   harrington rod scoliosis     1981   LEFT HEART CATHETERIZATION WITH CORONARY ANGIOGRAM N/A 05/14/2014   Procedure: LEFT HEART CATHETERIZATION WITH CORONARY ANGIOGRAM;  Surgeon: Iran Ouch, MD;  Location: [facility]ATH [facility];  Service: Cardiovascular;  Laterality: N/A;   ROTATOR CUFF REPAIR     UMBILICAL HERNIA REPAIR     VESICOVAGINAL FISTULA CLOSURE W/ TAH      Family History  Problem Relation Age of Onset   Diabetes Mother    Hyperlipidemia Mother    Obesity Mother    Emphysema Father    Allergies Father    Heart failure Father    Heart disease Father 55       MI   Diabetes Father    Hyperlipidemia Father    Hypertension Father    Thyroid disease Father    Alcohol abuse Father    Obesity Father    Breast cancer Neg Hx    Social History   Socioeconomic History   Marital status:  Married    Spouse name: Not on file   Number of children: Not on file   Years of education: Not on file   Highest education level: Not on file  Occupational History   Occupation: disabled  Tobacco Use   Smoking status: Former    Packs/day: 1.00    Years: 15.00    Total pack years: 15.00  Types: Cigarettes    Quit date: 02/14/2005    Years since quitting: 16.8   Smokeless tobacco: Never  Vaping Use   Vaping Use: Never used  Substance and Sexual Activity   Alcohol use: Yes    Comment: occ   Drug use: No   Sexual activity: Not on file  Other Topics Concern   Not on file  Social History Narrative   Lives in [place] with spouse and son.   Previously worked as a Comptroller         Social Determinants of Corporate investment banker Strain: High Risk (07/21/2021)   Overall Financial Resource Strain (CARDIA)    Difficulty of Paying Living Expenses: Hard  Food Insecurity: No Food Insecurity (05/14/2020)   Hunger Vital Sign    Worried About Running Out of Food in the Last Year: Never true    Ran Out of Food in the Last Year: Never true  Transportation Needs: No Transportation Needs (07/21/2021)   PRAPARE - Administrator, Civil Service (Medical): No    Lack of Transportation (Non-Medical): No  Physical Activity: Inactive (01/26/2021)   Exercise Vital Sign    Days of Exercise per Week: 0 days    Minutes of Exercise per Session: 0 min  Stress: Not on file  Social Connections: Not on file    Review of Systems  Constitutional:  Negative for chills, fatigue and fever.  HENT:  Negative for congestion, ear pain and sore throat.   Respiratory:  Positive for shortness of breath. Negative for cough.   Cardiovascular:  Negative for chest pain and palpitations.  Gastrointestinal:  Negative for abdominal pain, constipation, diarrhea, nausea and vomiting.  Endocrine: Negative for polydipsia, polyphagia and polyuria.  Genitourinary:  Negative for difficulty  urinating and dysuria.  Musculoskeletal:  Negative for arthralgias, back pain and myalgias.  Skin:  Negative for rash.  Neurological:  Negative for headaches.  Psychiatric/Behavioral:  Negative for dysphoric mood. The patient is not nervous/anxious.     Objective:  BP 100/80   Pulse 77   Temp 98.6 F (37 C)   Resp 14   Ht 5\' 5"  (1.651 m)   Wt 273 lb (123.8 kg)   SpO2 95%   BMI 45.43 kg/m      12/16/2021    3:35 PM 11/02/2021    2:26 PM 10/11/2021   10:39 AM  BP/Weight  Systolic BP 100 140   Diastolic BP 80 80   Wt. (Lbs) 273 275 275  BMI 45.43 kg/m2 45.76 kg/m2 45.76 kg/m2    Physical Exam Vitals reviewed.  Constitutional:      Appearance: Normal appearance. She is obese.  Neck:     Vascular: No carotid bruit.  Cardiovascular:     Rate and Rhythm: Normal rate and regular rhythm.     Heart sounds: Normal heart sounds.  Pulmonary:     Effort: Pulmonary effort is normal. No respiratory distress.     Breath sounds: Normal breath sounds.  Abdominal:     General: Abdomen is flat. Bowel sounds are normal.     Palpations: Abdomen is soft.     Tenderness: There is no abdominal tenderness.  Neurological:     Mental Status: She is alert and oriented to person, place, and time.  Psychiatric:        Mood and Affect: Mood normal.        Behavior: Behavior normal.     Diabetic Foot Exam - Simple  Simple Foot Form  12/16/2021  6:03 PM  Visual Inspection No deformities, no ulcerations, no other skin breakdown bilaterally: Yes Sensation Testing Intact to touch and monofilament testing bilaterally: Yes Pulse Check Posterior Tibialis and Dorsalis pulse intact bilaterally: Yes Comments      Lab Results  Component Value Date   WBC 5.7 12/13/2021   HGB 13.9 12/13/2021   HCT 41.1 12/13/2021   PLT 225 12/13/2021   GLUCOSE 78 12/13/2021   CHOL 130 12/13/2021   TRIG 128 12/13/2021   HDL 42 12/13/2021   LDLCALC 65 12/13/2021   ALT 43 (H) 12/13/2021   AST 22 12/13/2021    NA 146 (H) 12/13/2021   K 4.3 12/13/2021   CL 109 (H) 12/13/2021   CREATININE 0.76 12/13/2021   BUN 14 12/13/2021   CO2 24 12/13/2021   TSH 1.400 12/13/2021   INR 1.0 05/13/2014   HGBA1C 6.0 (H) 12/13/2021   MICROALBUR Negative 09/03/2019      Assessment & Plan:   Problem List Items Addressed This Visit       Cardiovascular and Mediastinum   Hypertensive heart disease with CHF (congestive heart failure) ([place]) - Primary    The current medical regimen is effective;  continue present plan and medications. Continue Coreg 6.25 mg twice daily, Entresto 24-26 mg twice daily, aspirin,         Respiratory   COPD GOLD  III     Worsened. Recommend patient call for a pulmonology visit. Continue current inhalers.       Chronic respiratory failure with hypoxia (HCC)    Continue oxygen 2 L daily.       OSA (obstructive sleep apnea)    Continue cpap and oxygen 2 L.         Endocrine   Hyperlipidemia associated with type 2 diabetes mellitus ([place])    Well controlled.  No changes to medicines. Continue lipitor, vascepa, coenzyme Q 10 daily. Continue to work on eating a healthy diet and exercise.  Labs reviewed.       Other specified hypothyroidism    Previously well controlled Continue Synthroid at current dose  Recheck TSH and adjust Synthroid as indicated           Other   Morbid obesity due to excess calories (HCC)    Continue 2 L oxygen      GAD (generalized anxiety disorder)    The current medical regimen is effective;  continue present plan and medications. Continue wellbutrin XL 300 mg once daily in a.m. clonazepam 0.5 mg 1 twice daily as needed for panic attacks.        Mild recurrent major depression (HCC)    The current medical regimen is effective;  continue present plan and medications. Continue wellbutrin XL 300 mg once daily in a.m. clonazepam 0.5 mg 1 twice daily as needed for panic attacks.       Neck pain    Recommend use tylenol, tramadol,  ice, heat, tens unit.      Relevant Medications   meloxicam (MOBIC) 15 MG tablet   Pain syndrome, chronic    Back and neck pain.  Continue tramadol 50 mg one to two times per week and tylenol. Has muscle relaxant. Using TENS unit.       Relevant Medications   meloxicam (MOBIC) 15 MG tablet   Vitamin D deficiency    The current medical regimen is effective;  continue present plan and medications. Continue vitamin D 50K weekly.  Relevant Medications   Vitamin D, Ergocalciferol, (DRISDOL) 1.25 MG (50000 UNIT) CAPS capsule   Encounter for immunization   Relevant Orders   Flu Vaccine MDCK QUAD PF (Completed)   Pfizer Fall 2023 Covid-19 Vaccine 32yrs and older (Completed)   Mixed hyperlipidemia    Well controlled.  No changes to medicines. Continue lipitor, vascepa, coenzyme Q 10 daily Continue to work on eating a healthy diet and exercise.  Labs drawn today.       .  Meds ordered this encounter  Medications   meloxicam (MOBIC) 15 MG tablet    Sig: Take 1 tablet (15 mg total) by mouth daily.    Dispense:  90 tablet    Refill:  3   Vitamin D, Ergocalciferol, (DRISDOL) 1.25 MG (50000 UNIT) CAPS capsule    Sig: TAKE 1 CAPSULE EVERY 7 DAYS    Dispense:  12 capsule    Refill:  3    Orders Placed This Encounter  Procedures   Flu Vaccine MDCK QUAD PF   Pfizer Fall 2023 Covid-19 Vaccine 62yrs and older     Follow-up: Return in about 3 months (around 03/18/2022) for chronic follow up.  An After Visit Summary was printed and given to the patient.  Blane Ohara, MD [facility] [phone]

## 2021-12-19 ENCOUNTER — Other Ambulatory Visit: Payer: Self-pay | Admitting: Family Medicine

## 2021-12-19 ENCOUNTER — Encounter: Payer: Self-pay | Admitting: Family Medicine

## 2021-12-19 DIAGNOSIS — J9611 Chronic respiratory failure with hypoxia: Secondary | ICD-10-CM

## 2021-12-19 NOTE — Assessment & Plan Note (Signed)
Worsened. Recommend patient call for a pulmonology visit. Continue current inhalers.

## 2021-12-19 NOTE — Assessment & Plan Note (Signed)
Continue 2L oxygen. 

## 2021-12-19 NOTE — Assessment & Plan Note (Signed)
Recommend use tylenol, tramadol, ice, heat, tens unit.

## 2021-12-19 NOTE — Assessment & Plan Note (Signed)
The current medical regimen is effective;  continue present plan and medications. Continue wellbutrin XL 300 mg once daily in a.m. clonazepam 0.5 mg 1 twice daily as needed for panic attacks. 

## 2021-12-19 NOTE — Assessment & Plan Note (Signed)
Previously well controlled Continue Synthroid at current dose  Recheck TSH and adjust Synthroid as indicated   

## 2021-12-19 NOTE — Assessment & Plan Note (Signed)
Continue cpap and oxygen 2 L.

## 2021-12-19 NOTE — Assessment & Plan Note (Addendum)
Well controlled.  No changes to medicines. Continue lipitor, vascepa, coenzyme Q 10 daily. Continue to work on eating a healthy diet and exercise.  Labs reviewed.

## 2021-12-19 NOTE — Assessment & Plan Note (Signed)
The current medical regimen is effective;  continue present plan and medications. Continue vitamin D 50K weekly.  

## 2021-12-19 NOTE — Assessment & Plan Note (Signed)
The current medical regimen is effective;  continue present plan and medications. Continue Coreg 6.25 mg twice daily, Entresto 24-26 mg twice daily, aspirin,

## 2021-12-19 NOTE — Assessment & Plan Note (Signed)
Back and neck pain.  Continue tramadol 50 mg one to two times per week and tylenol. Has muscle relaxant. Using TENS unit.

## 2021-12-19 NOTE — Assessment & Plan Note (Signed)
Continue oxygen 2 L daily. 

## 2021-12-19 NOTE — Assessment & Plan Note (Signed)
Well controlled.  No changes to medicines. Continue lipitor, vascepa, coenzyme Q 10 daily Continue to work on eating a healthy diet and exercise.  Labs drawn today.

## 2021-12-21 ENCOUNTER — Ambulatory Visit (INDEPENDENT_AMBULATORY_CARE_PROVIDER_SITE_OTHER): Payer: Medicare HMO

## 2021-12-21 DIAGNOSIS — J449 Chronic obstructive pulmonary disease, unspecified: Secondary | ICD-10-CM

## 2021-12-21 DIAGNOSIS — E1169 Type 2 diabetes mellitus with other specified complication: Secondary | ICD-10-CM

## 2021-12-21 DIAGNOSIS — I110 Hypertensive heart disease with heart failure: Secondary | ICD-10-CM

## 2021-12-21 NOTE — Progress Notes (Signed)
Chronic Care Management Pharmacy Note  12/21/2021 Name:  Judy [name] MRN:  [id number] DOB:  November 24, 1960   Plan Updates:  Renew PAP's for 2024 Patient due for AWV and DM Foot exam  Subjective: Judy [name] is an 61 y.o. year old female who is a primary patient of Cox, Kirsten, MD.  The CCM team was consulted for assistance with disease management and care coordination needs.    Engaged with patient face to face for follow up visit in response to provider referral for pharmacy case management and/or care coordination services.   Consent to Services:  The patient was given the following information about Chronic Care Management services today, agreed to services, and gave verbal consent: 1. CCM service includes personalized support from designated clinical staff supervised by the primary care provider, including individualized plan of care and coordination with other care providers 2. 24/7 contact phone numbers for assistance for urgent and routine care needs. 3. Service will only be billed when office clinical staff spend 20 minutes or more in a month to coordinate care. 4. Only one practitioner may furnish and bill the service in a calendar month. 5.The patient may stop CCM services at any time (effective at the end of the month) by phone call to the office staff. 6. The patient will be responsible for cost sharing (co-pay) of up to 20% of the service fee (after annual deductible is met). Patient agreed to services and consent obtained.  Patient Care Team: Rochel Brome, MD as PCP - General (Family Medicine) Sueanne Margarita, MD as PCP - Cardiology (Cardiology) Lane Hacker, [facility] (Pharmacist)  Recent office visits:  09/07/21 Rochel Brome MD. Seen for OSA. D/C Fenofibrate 135m and Ondansetron 417m    Recent consult visits:  None   Hospital visits:  None  Objective:  Lab Results  Component Value Date   CREATININE 0.76 12/13/2021   BUN 14 12/13/2021   GFR 100.87  10/01/2014   GFRNONAA >60 02/27/2021   GFRAA 87 04/06/2020   NA 146 (H) 12/13/2021   K 4.3 12/13/2021   CALCIUM 9.2 12/13/2021   CO2 24 12/13/2021   GLUCOSE 78 12/13/2021    Lab Results  Component Value Date/Time   HGBA1C 6.0 (H) 12/13/2021 08:44 AM   HGBA1C 5.8 (H) 09/03/2021 08:36 AM   GFR 100.87 10/01/2014 10:54 AM   GFR 84.34 05/13/2014 02:32 PM   MICROALBUR Negative 09/03/2019 02:33 PM    Last diabetic Eye exam:  Lab Results  Component Value Date/Time   HMDIABEYEEXA No Retinopathy 03/01/2021 12:00 AM    Last diabetic Foot exam: No results found for: "HMDIABFOOTEX"   Lab Results  Component Value Date   CHOL 130 12/13/2021   HDL 42 12/13/2021   LDLCALC 65 12/13/2021   TRIG 128 12/13/2021   CHOLHDL 3.1 12/13/2021       Latest Ref Rng & Units 12/13/2021    8:44 AM 09/03/2021    8:36 AM 04/20/2021    9:09 AM  Hepatic Function  Total Protein 6.0 - 8.5 g/dL 6.0  6.2  5.9   Albumin 3.9 - 4.9 g/dL 4.0  4.0  4.1   AST 0 - 40 IU/L _0 ALT 0 - 32 IU/L 43  23  29   Alk Phosphatase 44 - 121 IU/L 149  172  158   Total Bilirubin 0.0 - 1.2 mg/dL 0.7  0.9  1.0     Lab Results  Component Value  Date/Time   TSH 1.400 12/13/2021 08:44 AM   TSH 2.060 09/03/2021 08:37 AM   FREET4 1.19 07/17/2019 01:06 PM       Latest Ref Rng & Units 12/13/2021    8:44 AM 09/03/2021    8:36 AM 04/20/2021    9:09 AM  CBC  WBC 3.4 - 10.8 x10E3/uL 5.7  8.0  6.3   Hemoglobin 11.1 - 15.9 g/dL 13.9  14.4  14.4   Hematocrit 34.0 - 46.6 % 41.1  42.3  42.7   Platelets 150 - 450 x10E3/uL 225  234  235     Lab Results  Component Value Date/Time   VD25OH CANCELED 09/03/2021 08:37 AM   VD25OH 37.5 07/17/2019 01:06 PM    Clinical ASCVD: No  The 10-year ASCVD risk score (Arnett DK, et al., 2019) is: 5%   Values used to calculate the score:     Age: 26 years     Sex: Female     Is Non-Hispanic African American: No     Diabetic: Yes     Tobacco smoker: No     Systolic Blood Pressure:  100 mmHg     Is BP treated: Yes     HDL Cholesterol: 42 mg/dL     Total Cholesterol: 130 mg/dL       12/16/2021    3:38 PM 09/07/2021    3:48 PM 04/23/2021   11:07 AM  Depression screen PHQ 2/9  Decreased Interest 0 0 0  Down, Depressed, Hopeless 0 0 0  PHQ - 2 Score 0 0 0  Altered sleeping 0    Tired, decreased energy 0    Change in appetite 0    Feeling bad or failure about yourself  0    Trouble concentrating 0    Moving slowly or fidgety/restless 0    Suicidal thoughts 0    PHQ-9 Score 0       Social History   Tobacco Use  Smoking Status Former   Packs/day: 1.00   Years: 15.00   Total pack years: 15.00   Types: Cigarettes   Quit date: 02/14/2005   Years since quitting: 16.8  Smokeless Tobacco Never   BP Readings from Last 3 Encounters:  12/16/21 100/80  11/02/21 (!) 140/80  09/07/21 92/64   Pulse Readings from Last 3 Encounters:  12/16/21 77  11/02/21 86  09/07/21 84   Wt Readings from Last 3 Encounters:  12/16/21 273 lb (123.8 kg)  11/02/21 275 lb (124.7 kg)  10/11/21 275 lb (124.7 kg)   BMI Readings from Last 3 Encounters:  12/16/21 45.43 kg/m  11/02/21 45.76 kg/m  10/11/21 45.76 kg/m    Assessment/Interventions: Review of patient past medical history, allergies, medications, health status, including review of consultants reports, laboratory and other test data, was performed as part of comprehensive evaluation and provision of chronic care management services.   SDOH:  (Social Determinants of Health) assessments and interventions performed: Yes SDOH Interventions    Flowsheet Row Chronic Care Management from 12/21/2021 in [place] Management from 07/21/2021 in [place] from 01/26/2021 in [place] Management from 10/28/2020 in [place] Visit from 10/15/2020 in [place] Visit from 07/09/2020 in [place]  SDOH Interventions         Transportation Interventions Intervention Not Indicated Intervention Not Indicated Intervention Not Indicated -- -- --  Depression Interventions/Treatment  -- -- -- -- Medication Medication  Financial  Strain Interventions Other (Comment) Other (Comment)  [PAP (See CP)] -- Other (Comment)  [PAP] -- --      SDOH Screenings   Food Insecurity: No Food Insecurity (05/14/2020)  Housing: Low Risk  (05/14/2020)  Transportation Needs: No Transportation Needs (12/21/2021)  Alcohol Screen: Low Risk  (07/09/2020)  Depression (PHQ2-9): Low Risk  (12/16/2021)  Financial Resource Strain: High Risk (12/21/2021)  Physical Activity: Inactive (01/26/2021)  Tobacco Use: Medium Risk (12/19/2021)    CCM Care Plan  Allergies  Allergen Reactions   Demerol [Meperidine] Nausea And Vomiting    Medications Reviewed Today     Reviewed by Lane Hacker, [facility] (Pharmacist) on 12/21/21 at 31  Med List Status: <None>   Medication Order Taking? Sig Documenting Provider Last Dose Status Informant  albuterol (VENTOLIN HFA) 108 (90 Base) MCG/ACT inhaler [id number] No Inhale 2 puffs into the lungs every 6 (six) hours as needed for wheezing or shortness of breath. CoxElnita Maxwell, MD Taking Active   aspirin 81 MG tablet [id number] No Take 81 mg by mouth at bedtime.  [provider] Taking Active Self  atorvastatin (LIPITOR) 20 MG tablet [id number] No TAKE 1 TABLET EVERY DAY Marge Duncans, PA-C Taking Active   buPROPion (WELLBUTRIN XL) 300 MG 24 hr tablet [id number] No TAKE 1 TABLET EVERY DAY Cox, Kirsten, MD Taking Active   carvedilol (COREG) 6.25 MG tablet [id number] No TAKE 1 TABLET TWICE DAILY Turner, Eber Hong, MD Taking Active   clonazePAM (KLONOPIN) 0.5 MG tablet [id number] No Take 1 tablet (0.5 mg total) by mouth 2 (two) times daily as needed for anxiety (panic attacks). Cox, Kirsten, MD Taking Active   Coenzyme Q10 (CO Q 10) 100 MG CAPS [id number] No Take 200 mg by mouth daily. [provider] Taking Active    diphenhydrAMINE (BENADRYL) 25 MG tablet [id number] No Take 50 mg by mouth at bedtime as needed. [provider] Taking Active   Dulaglutide (TRULICITY) 3 OY/7.7AJ SOPN [id number] No Inject 3 mg as directed once a week. Cox, Kirsten, MD Taking Active   FARXIGA 10 MG TABS tablet [id number] No Take 1 tablet (10 mg total) by mouth daily. Cox, Kirsten, MD Taking Active   fluticasone [facility]) 50 MCG/ACT nasal spray [id number] No SPRAY 2 SPRAYS INTO EACH NOSTRIL EVERY DAY Cox, Kirsten, MD Taking Active   gabapentin (NEURONTIN) 800 MG tablet [id number] No TAKE 1 TABLET THREE TIMES DAILY Cox, Kirsten, MD Taking Active   ipratropium-albuterol (DUONEB) 0.5-2.5 (3) MG/3ML nebulizer solution 3 mL [id number]   Rochel Brome, MD  Active   ipratropium-albuterol (DUONEB) 0.5-2.5 (3) MG/3ML SOLN [id number] No Take 3 mLs by nebulization every 4 (four) hours as needed. Cox, Kirsten, MD Taking Active   levothyroxine (SYNTHROID) 75 MCG tablet [id number] No TAKE 1 TABLET (75 MCG TOTAL) BY MOUTH DAILY. Cox, Kirsten, MD Taking Active   Lutein 20 MG TABS [id number] No Take 1 tablet by mouth daily. [provider] Taking Active   meloxicam (MOBIC) 15 MG tablet [id number]  Take 1 tablet (15 mg total) by mouth daily. Cox, Kirsten, MD  Active   Multiple Vitamin (MULTIVITAMIN WITH MINERALS) TABS tablet [id number] No Take 1 tablet by mouth daily. [provider] Taking Active Self  OXYGEN [id number] No 2lpm 2 lpm as needed [provider] Taking Active   sacubitril-valsartan (ENTRESTO) 24-26 MG [id number] No Take 1 tablet by mouth 2 (two) times daily. Cox, Kirsten, MD Taking Active   Tiotropium Bromide-Olodaterol (STIOLTO RESPIMAT) 2.5-2.5 MCG/ACT AERS [id number] No INHALE 2 PUFFS INTO  THE LUNGS DAILY. Tanda Rockers, MD Taking Active   tiZANidine (ZANAFLEX) 4 MG tablet [id number] No TAKE 1 TABLET EVERY 6 HOURS AS NEEDED FOR MUSCLE SPASM(S) Cox, Kirsten, MD Taking Active   traMADol (ULTRAM) 50 MG tablet  [id number] No Take 1 tablet (50 mg total) by mouth every 6 (six) hours as needed for severe pain. Take one four times a day as needed for pain Cox, Elnita Maxwell, MD Taking Active   UNABLE TO FIND [id number] No CPAP with o2 2lpm  DME- [facility] [provider] Taking Active   VASCEPA 1 g capsule [id number] No TAKE 2 CAPSULES BY MOUTH TWICE A DAY Cox, Kirsten, MD Taking Active   Vitamin D, Ergocalciferol, (DRISDOL) 1.25 MG (50000 UNIT) CAPS capsule [id number]  TAKE 1 CAPSULE EVERY 7 DAYS Cox, Kirsten, MD  Active             Patient Active Problem List   Diagnosis Date Noted   Simple chronic bronchitis ([place]) 10/11/2021   Mixed hyperlipidemia 09/03/2021   Encounter for immunization 05/15/2021   Trigger index finger of right hand 05/15/2021   Chronic right shoulder pain 05/15/2021   Ulnar neuropathy at elbow of right upper extremity 05/15/2021   Cervical radiculopathy 01/28/2021   Ulnar impingement syndrome of right upper extremity 11/10/2020   GAD (generalized anxiety disorder) 09/03/2019   Arthritis 09/03/2019   Mild recurrent major depression ([place]) 09/03/2019   H/O scoliosis 09/03/2019   History of migraine headaches 09/03/2019   Neuropathy 09/03/2019   Post-menopausal 09/03/2019   Seasonal allergies 09/03/2019   Vitamin D deficiency 09/03/2019   Hypertensive heart disease with CHF (congestive heart failure) ([place]) 09/03/2019   Other specified hypothyroidism 09/03/2019   Hyperlipidemia associated with type 2 diabetes mellitus ([place]) 07/30/2019   OSA (obstructive sleep apnea)    Leg swelling 78/24/2353   Chronic systolic CHF (congestive heart failure), NYHA class 2 ([place]) 10/07/2014   Morbid obesity due to excess calories ([place]) 08/23/2014   DCM (dilated cardiomyopathy) ([place]) 04/25/2014   COPD (chronic obstructive pulmonary disease) ([place])    Chronic respiratory failure with hypoxia ([place]) 04/10/2014   Hypertension associated with diabetes ([place]) 10/26/2013   COPD GOLD  III  10/09/2013    Ganglion cyst 10/11/2012   Other ganglion and cyst of synovium, tendon, and bursa(727.49) 09/20/2012   Degenerative arthritis of hip 07/26/2012   Foot pain, bilateral 05/31/2012   Hip pain, right 05/31/2012   Facet arthropathy, lumbar 03/08/2012   Flatback syndrome 03/08/2012   Long term current use of opiate analgesic 03/08/2012   Pain syndrome, chronic 03/08/2012   Knee pain 01/06/2010   Mid back pain 01/06/2010   Neck pain 01/06/2010   Transfusion history 11/14/2006    Immunization History  Administered Date(s) Administered   COVID-19, mRNA, vaccine(Comirnaty)12 years and older 12/16/2021   Influenza Inj Mdck Quad Pf 01/13/2020, 10/15/2020, 12/16/2021   Influenza Split 10/15/2013, 11/24/2014   Influenza-Unspecified 11/14/2012, 11/04/2018   PFIZER(Purple Top)SARS-COV-2 Vaccination 05/09/2019, 06/03/2019, 11/14/2019   Pfizer Covid-19 Vaccine Bivalent Booster 32yr & up 11/10/2020   Pneumococcal Polysaccharide-23 12/05/2013, 11/28/2014   Pneumococcal-Unspecified 10/15/2013   Tdap 09/30/2015    Conditions to be addressed/monitored:  Hypertension, Hyperlipidemia, Diabetes, COPD, Hypothyroidism, Depression, Anxiety, Osteoarthritis and vitamin d deficiency.   Care Plan : CSidney Updates made by KLane Hacker RPittsborosince 12/21/2021 12:00 AM     Problem: dm, hld, chf   Priority: High  Onset Date: 05/14/2020     Long-Range Goal: Patient-Specific Goal  Start Date: 05/14/2020  Expected End Date: 05/14/2021  Recent Progress: On track  Priority: High  Note:    Current Barriers:  Unable to independently afford treatment regimen  Pharmacist Clinical Goal(s):  Patient will verbalize ability to afford treatment regimen through collaboration with PharmD and provider.   Interventions: 1:1 collaboration with Cox, Elnita Maxwell, MD regarding development and update of comprehensive plan of care as evidenced by provider attestation and co-signature Inter-disciplinary care  team collaboration (see longitudinal plan of care) Comprehensive medication review performed; medication list updated in electronic medical record  Hyperlipidemia: (LDL goal < 70) Lab Results  Component Value Date   CHOL 130 12/13/2021   CHOL 131 09/03/2021   CHOL 132 04/20/2021   Lab Results  Component Value Date   HDL 42 12/13/2021   HDL 40 09/03/2021   HDL 37 (L) 04/20/2021   Lab Results  Component Value Date   LDLCALC 65 12/13/2021   [place] 64 09/03/2021   [place] 67 04/20/2021   Lab Results  Component Value Date   TRIG 128 12/13/2021   TRIG 154 (H) 09/03/2021   TRIG 163 (H) 04/20/2021   Lab Results  Component Value Date   CHOLHDL 3.1 12/13/2021   CHOLHDL 3.3 09/03/2021   CHOLHDL 3.6 04/20/2021  No results found for: "LDLDIRECT" -Not ideally controlled -Current treatment: vascepa 1 gram 2 capsules twice daily ([place] grant) Appropriate, Effective, Safe, Accessible Atorvastatin 20 mg daily Query Appropriate, Query effective, Safe, Accessible Aspirin 81 mg daily at bedtime Appropriate, Effective, Safe, Accessible -Medications previously tried: none reported  -Current dietary patterns: cooks at home and watches salt intake -Current exercise habits: no formal exercise but has joined [employer] and hopes to begin working out in the pool -Educated on Cholesterol goals;  Benefits of statin for ASCVD risk reduction; Importance of limiting foods high in cholesterol; Exercise goal of 150 minutes per week; -Counseled on diet and exercise extensively Assessed cost of Vascepa. Coordinated [employer]. [employer] cannot secondary bill Vascepa to [place] so pharmacist coordinating [facility].  June 2023: Candidate for high intensity statin Recommended to continue current medication  Diabetes (A1c goal <7%) Lab Results  Component Value Date   HGBA1C 6.0 (H) 12/13/2021   HGBA1C 5.8 (H) 09/03/2021   HGBA1C 5.8 (H) 04/20/2021   Lab Results   Component Value Date   MICROALBUR Negative 09/03/2019   LDLCALC 65 12/13/2021   CREATININE 0.76 12/13/2021   Lab Results  Component Value Date   NA 146 (H) 12/13/2021   K 4.3 12/13/2021   CREATININE 0.76 12/13/2021   GFRNONAA >60 02/27/2021   GFRAA 87 04/06/2020   GLUCOSE 78 12/13/2021   Lab Results  Component Value Date   WBC 5.7 12/13/2021   HGB 13.9 12/13/2021   HCT 41.1 12/13/2021   MCV 93 12/13/2021   PLT 225 12/13/2021  -controlled -Current medications: Trulicity 3 mg weekly (PAP) Appropriate, Effective, Safe, Accessible Farxiga 10 mg daily (PAP) Appropriate, Effective, Safe, Accessible -Medications previously tried: none reported  -Current home glucose readings fasting glucose:  June 2023: 90's-100's but patient rarely checks post prandial glucose: not checking  -Denies hypoglycemic/hyperglycemic symptoms -Current meal patterns:  Patient reports cooking at home mainly. Works to avoid sodium and buys no salt added options. States that she probably doesn't eat as well as she should.  -Current exercise: no formal exercise currently but has joined the [employer] for water aerobics.  -Educated on A1c and blood sugar goals; Complications of diabetes including kidney damage,  retinal damage, and cardiovascular disease; Exercise goal of 150 minutes per week; Benefits of routine self-monitoring of blood sugar; -Counseled to check feet daily and get yearly eye exams -Counseled on diet and exercise extensively Recommended to continue current medication Collaborated with [employer] patient assistance to request Trulicity 3 mg weekly dose shipped to patient. Patient has had poor success with Libre 2 sample sticking to her arm. Pharmacist provided patch to assist with this.  Assessed patient's eligibility for [employer] patient assistance program.  Patient currently receives [place] from [place] and [place]. Discussed benefit for heart failure, kidney and blood sugar management.   June 2023: Patient  rarely checks sugars. Counseled at length on importance November 2023: Renew PAP  Hypothyroid (Goal: manage TSH and symptoms of thyroid disease) Lab Results  Component Value Date   TSH 1.400 12/13/2021  -Controlled -Current treatment  Levothyroxine 75 mcg daily Appropriate, Effective, Safe, Accessible -Medications previously tried: none reported  -Recommended to continue current medication  Heart Failure (Goal: manage symptoms and prevent exacerbations) -Controlled -Home BP Readings:   June 2023: Doesn't test, counseled on testing slightly more often, especially after Sx  November 2023: 124/76 (never in 130's) -Last ejection fraction: 55% (Date: 06/2017) -HF type: Systolic -NYHA Class: II (slight limitation of activity) -Current treatment: Entresto 24-26 mg bid (PAP) Appropriate, Effective, Safe, Accessible Carvedilol 6.25 mg bid Appropriate, Effective, Safe, Accessible -Medications previously tried: Entresto higher doses -Current home BP/HR readings: 120-130/75-80 Pulse 70-80 -Current dietary habits: watches sodium and purchases no salt added products. Mainly cooks at home.  -Current exercise habits: no formal exercise currently. Has joined the [facility] [place] [facility] and hopes to begin pool exercises.  -Educated on Benefits of medications for managing symptoms and prolonging life Importance of blood pressure control -Counseled on diet and exercise extensively Recommended to continue current medication Recommend continuing to monitor heart rate and blood pressure to avoid hypotension.   COPD (Goal: control symptoms and prevent exacerbations) -Controlled -Current treatment  Albuterol inhaler 2 puffs into the lungs eery 6 hours prn wheezing or shortness of breath Appropriate, Effective, Safe, Accessible Stiolto 2 puffs daily (PAP) Appropriate, Effective, Safe, Accessible -Medications previously tried:  [place], symbicort  -Gold Grade: Gold IIIB -Pulmonary function testing:  PF Readings from  Last 3 Encounters:  No data found for PF  - 11/27/2013  PFTs   FEV1  1.51 (54%) ratio 52 and and no better p B2 and DLCO  71 - 01/08/2014  p extensive coaching HFA effectiveness =    90% > rec resume symbicort 160 2bid  - 03/27/14 trial of dulera 200/tudorza samples only to assure adherence  04/10/2014   Alpha 1 MM , nml level  - trial off spiriva 05/22/14 / off all resp rx 08/22/14 - PFT's  10/09/2014  FEV1 1.64 (59 % ) ratio 54  p 11 % improvement from saba with DLCO  69 % corrects to 73  % for alv volume   - 07/14/2015  A  > try spiriva 2 puffs each am > no better so pt d/c'd  - PFT's  07/18/2017  FEV1 1.10 (41 % ) ratio 46   p 19 % improvement from saba p nothing prior to study with DLCO  70 % corrects to 75  % for alv volume  On coreg  - 07/18/2017    try stiolto  - Spirometry 12/04/2017  FEV1 1.3 (49%)  Ratio 49 p am stiolto x 2 puffs  w classic curvature - 06/26/2018  After extensive coaching inhaler device,  effectiveness =   90%  -Exacerbations requiring treatment in last 6 months: none reported -Patient reports consistent use of maintenance inhaler -Frequency of rescue inhaler use: rarely but has one in case needed -Counseled on Benefits of consistent maintenance inhaler use Differences between maintenance and rescue inhalers -Recommended to continue current medication  Depression/Anxiety (Goal: manage symptoms of anxiety) -Controlled -Current treatment: clonazepam 0.5 mg bid prn anxiety (Uses 2-3x/3 -6 months) Appropriate, Effective, Safe, Accessible Bupropion XL 300 mg daily Appropriate, Effective, Safe, Accessible -Medications previously tried/failed: none reported -PHQ9:     12/16/2021    3:38 PM 09/07/2021    3:48 PM 04/23/2021   11:07 AM  Depression screen PHQ 2/9  Decreased Interest 0 0 0  Down, Depressed, Hopeless 0 0 0  PHQ - 2 Score 0 0 0  Altered sleeping 0    Tired, decreased energy 0    Change in appetite 0    Feeling bad or failure about yourself  0    Trouble  concentrating 0    Moving slowly or fidgety/restless 0    Suicidal thoughts 0    PHQ-9 Score 0    -GAD7:      No data to display        -Discussed benefits of exercise, cognitive behavior therapy, meditation or journaling for mental health support -Educated on Benefits of medication for symptom control Benefits of cognitive-behavioral therapy with or without medication -Counseled on diet and exercise extensively Recommended to continue current medication Educated on benefits of symptom management for overall health.   Chronic Back Pain (Goal: manage symptoms) -Hardware in back since 1981 Pain Scale: 10/28/20 With Meds: 4/10 (Content on therapy and she's able to "do things around the house"_ Without Meds: 5-6/10 -Controlled -Current treatment  tizanidine 4 mg every 6 hours prn Appropriate, Effective, Safe, Accessible Tramadol 50 mg bid prn Appropriate, Effective, Safe, Accessible Gabapentin 400 mg 2 capsules am and 2 capsules pm Appropriate, Effective, Safe, Accessible -Medications previously tried: none reported  -Recommended to continue current medication Counseled on benefits of pool exercise.   Patient Goals/Self-Care Activities Patient will:  - take medications as prescribed focus on medication adherence by using pill box check glucose daily, document, and provide at future appointments check blood pressure daily, document, and provide at future appointments target a minimum of 150 minutes of moderate intensity exercise weekly  Follow Up Plan: Telephone follow up appointment with care management team member scheduled for: May 2024  [place] Constable, [place].D. - [phone]       Medication Assistance:  Zollie Beckers, Judithann Sauger, [place] ([place] grant)obtained through [place] and [place]. medication assistance program.  Enrollment ends 02/13/2022  Patient's preferred pharmacy is:  [facility] [address], [place] [address] Phone: [phone] Fax: [phone]  [id number] [place], [address]Phone: [phone]: [phone] [id number] [place], [address]: [phone]: [address]: [phone]: [phone] Uses pill box? Yes - prepared 3 weeks of pill organizers at a time Pt endorses excellent compliance  We discussed: Current pharmacy is preferred with insurance plan and patient is satisfied with pharmacy services Patient decided to: Continue current medication management strategy  Care Plan and Follow Up Patient Decision:  Patient agrees to Care Plan and Follow-up.  Plan: Telephone follow up appointment with care management team member scheduled for:  May 2024  [place] Constable, [place].D. - [phone]

## 2021-12-21 NOTE — Patient Instructions (Signed)
Visit Information   Goals Addressed   None    Patient Care Plan: CCM Pharmacy Care Plan     Problem Identified: dm, hld, chf   Priority: High  Onset Date: 05/14/2020     Long-Range Goal: Patient-Specific Goal   Start Date: 05/14/2020  Expected End Date: 05/14/2021  Recent Progress: On track  Priority: High  Note:    Current Barriers:  Unable to independently afford treatment regimen  Pharmacist Clinical Goal(s):  Patient will verbalize ability to afford treatment regimen through collaboration with PharmD and provider.   Interventions: 1:1 collaboration with Cox, Elnita Maxwell, MD regarding development and update of comprehensive plan of care as evidenced by provider attestation and co-signature Inter-disciplinary care team collaboration (see longitudinal plan of care) Comprehensive medication review performed; medication list updated in electronic medical record  Hyperlipidemia: (LDL goal < 70) Lab Results  Component Value Date   CHOL 130 12/13/2021   CHOL 131 09/03/2021   CHOL 132 04/20/2021   Lab Results  Component Value Date   HDL 42 12/13/2021   HDL 40 09/03/2021   HDL 37 (L) 04/20/2021   Lab Results  Component Value Date   LDLCALC 65 12/13/2021   [place] 64 09/03/2021   [place] 67 04/20/2021   Lab Results  Component Value Date   TRIG 128 12/13/2021   TRIG 154 (H) 09/03/2021   TRIG 163 (H) 04/20/2021   Lab Results  Component Value Date   CHOLHDL 3.1 12/13/2021   CHOLHDL 3.3 09/03/2021   CHOLHDL 3.6 04/20/2021  No results found for: "LDLDIRECT" -Not ideally controlled -Current treatment: vascepa 1 gram 2 capsules twice daily ([place] grant) Appropriate, Effective, Safe, Accessible Atorvastatin 20 mg daily Query Appropriate, Query effective, Safe, Accessible Aspirin 81 mg daily at bedtime Appropriate, Effective, Safe, Accessible -Medications previously tried: none reported  -Current dietary patterns: cooks at home and watches salt intake -Current  exercise habits: no formal exercise but has joined [employer] and hopes to begin working out in the pool -Educated on Cholesterol goals;  Benefits of statin for ASCVD risk reduction; Importance of limiting foods high in cholesterol; Exercise goal of 150 minutes per week; -Counseled on diet and exercise extensively Assessed cost of Vascepa. Coordinated [employer]. [employer] cannot secondary bill Vascepa to [place] so pharmacist coordinating fill [facility].  June 2023: Candidate for high intensity statin Recommended to continue current medication  Diabetes (A1c goal <7%) Lab Results  Component Value Date   HGBA1C 6.0 (H) 12/13/2021   HGBA1C 5.8 (H) 09/03/2021   HGBA1C 5.8 (H) 04/20/2021   Lab Results  Component Value Date   MICROALBUR Negative 09/03/2019   LDLCALC 65 12/13/2021   CREATININE 0.76 12/13/2021   Lab Results  Component Value Date   NA 146 (H) 12/13/2021   K 4.3 12/13/2021   CREATININE 0.76 12/13/2021   GFRNONAA >60 02/27/2021   GFRAA 87 04/06/2020   GLUCOSE 78 12/13/2021   Lab Results  Component Value Date   WBC 5.7 12/13/2021   HGB 13.9 12/13/2021   HCT 41.1 12/13/2021   MCV 93 12/13/2021   PLT 225 12/13/2021  -controlled -Current medications: Trulicity 3 mg weekly (PAP) Appropriate, Effective, Safe, Accessible Farxiga 10 mg daily (PAP) Appropriate, Effective, Safe, Accessible -Medications previously tried: none reported  -Current home glucose readings fasting glucose:  June 2023: 90's-100's but patient rarely checks post prandial glucose: not checking  -Denies hypoglycemic/hyperglycemic symptoms -Current meal patterns:  Patient reports cooking at home mainly. Works to avoid sodium and  buys no salt added options. States that she probably doesn't eat as well as she should.  -Current exercise: no formal exercise currently but has joined the [employer] for water aerobics.  -Educated on A1c and blood sugar goals; Complications of diabetes  including kidney damage, retinal damage, and cardiovascular disease; Exercise goal of 150 minutes per week; Benefits of routine self-monitoring of blood sugar; -Counseled to check feet daily and get yearly eye exams -Counseled on diet and exercise extensively Recommended to continue current medication Collaborated with [employer] patient assistance to request Trulicity 3 mg weekly dose shipped to patient. Patient has had poor success with Libre 2 sample sticking to her arm. Pharmacist provided patch to assist with this.  Assessed patient's eligibility for [employer] patient assistance program.  Patient [place]. Discussed benefit for heart failure, kidney and blood sugar management.   June 2023: Patient rarely checks sugars. Counseled at length on importance November 2023: Renew PAP  Hypothyroid (Goal: manage TSH and symptoms of thyroid disease) Lab Results  Component Value Date   TSH 1.400 12/13/2021  -Controlled -Current treatment  Levothyroxine 75 mcg daily Appropriate, Effective, Safe, Accessible -Medications previously tried: none reported  -Recommended to continue current medication  Heart Failure (Goal: manage symptoms and prevent exacerbations) -Controlled -Home BP Readings:   June 2023: Doesn't test, counseled on testing slightly more often, especially after Sx  November 2023: 124/76 (never in 130's) -Last ejection fraction: 55% (Date: 06/2017) -HF type: Systolic -NYHA Class: II (slight limitation of activity) -Current treatment: Entresto 24-26 mg bid (PAP) Appropriate, Effective, Safe, Accessible Carvedilol 6.25 mg bid Appropriate, Effective, Safe, Accessible -Medications previously tried: Entresto higher doses -Current home BP/HR readings: 120-130/75-80 Pulse 70-80 -Current dietary habits: watches sodium and purchases no salt added products. Mainly cooks at home.  -Current exercise habits: no formal exercise currently. Has joined the [facility] and  hopes to begin pool exercises.  -Educated on Benefits of medications for managing symptoms and prolonging life Importance of blood pressure control -Counseled on diet and exercise extensively Recommended to continue current medication Recommend continuing to monitor heart rate and blood pressure to avoid hypotension.   COPD (Goal: control symptoms and prevent exacerbations) -Controlled -Current treatment  Albuterol inhaler 2 puffs into the lungs eery 6 hours prn wheezing or shortness of breath Appropriate, Effective, Safe, Accessible Stiolto 2 puffs daily (PAP) Appropriate, Effective, Safe, Accessible -Medications previously tried:  [place], symbicort  -Gold Grade: Gold IIIB -Pulmonary function testing:  PF Readings from Last 3 Encounters:  No data found for PF  - 11/27/2013  PFTs   FEV1  1.51 (54%) ratio 52 and and no better p B2 and DLCO  71 - 01/08/2014  p extensive coaching HFA effectiveness =    90% > rec resume symbicort 160 2bid  - 03/27/14 trial of dulera 200/tudorza samples only to assure adherence  04/10/2014   Alpha 1 MM , nml level  - trial off spiriva 05/22/14 / off all resp rx 08/22/14 - PFT's  10/09/2014  FEV1 1.64 (59 % ) ratio 54  p 11 % improvement from saba with DLCO  69 % corrects to 73  % for alv volume   - 07/14/2015  A  > try spiriva 2 puffs each am > no better so pt d/c'd  - PFT's  07/18/2017  FEV1 1.10 (41 % ) ratio 46   p 19 % improvement from saba p nothing prior to study with DLCO  70 % corrects to 75  %  for alv volume  On coreg  - 07/18/2017    try stiolto  - Spirometry 12/04/2017  FEV1 1.3 (49%)  Ratio 49 p am stiolto x 2 puffs  w classic curvature - 06/26/2018  After extensive coaching inhaler device,  effectiveness =   90%  -Exacerbations requiring treatment in last 6 months: none reported -Patient reports consistent use of maintenance inhaler -Frequency of rescue inhaler use: rarely but has one in case needed -Counseled on Benefits of consistent maintenance inhaler  use Differences between maintenance and rescue inhalers -Recommended to continue current medication  Depression/Anxiety (Goal: manage symptoms of anxiety) -Controlled -Current treatment: clonazepam 0.5 mg bid prn anxiety (Uses 2-3x/3 -6 months) Appropriate, Effective, Safe, Accessible Bupropion XL 300 mg daily Appropriate, Effective, Safe, Accessible -Medications previously tried/failed: none reported -PHQ9:     12/16/2021    3:38 PM 09/07/2021    3:48 PM 04/23/2021   11:07 AM  Depression screen PHQ 2/9  Decreased Interest 0 0 0  Down, Depressed, Hopeless 0 0 0  PHQ - 2 Score 0 0 0  Altered sleeping 0    Tired, decreased energy 0    Change in appetite 0    Feeling bad or failure about yourself  0    Trouble concentrating 0    Moving slowly or fidgety/restless 0    Suicidal thoughts 0    PHQ-9 Score 0    -GAD7:      No data to display        -Discussed benefits of exercise, cognitive behavior therapy, meditation or journaling for mental health support -Educated on Benefits of medication for symptom control Benefits of cognitive-behavioral therapy with or without medication -Counseled on diet and exercise extensively Recommended to continue current medication Educated on benefits of symptom management for overall health.   Chronic Back Pain (Goal: manage symptoms) -Hardware in back since 1981 Pain Scale: 10/28/20 With Meds: 4/10 (Content on therapy and she's able to "do things around the house"_ Without Meds: 5-6/10 -Controlled -Current treatment  tizanidine 4 mg every 6 hours prn Appropriate, Effective, Safe, Accessible Tramadol 50 mg bid prn Appropriate, Effective, Safe, Accessible Gabapentin 400 mg 2 capsules am and 2 capsules pm Appropriate, Effective, Safe, Accessible -Medications previously tried: none reported  -Recommended to continue current medication Counseled on benefits of pool exercise.   Patient Goals/Self-Care Activities Patient will:  - take  medications as prescribed focus on medication adherence by using pill box check glucose daily, document, and provide at future appointments check blood pressure daily, document, and provide at future appointments target a minimum of 150 minutes of moderate intensity exercise weekly  Follow Up Plan: Telephone follow up appointment with care management team member scheduled for: May 2024  Artelia Laroche, [place].D. - [phone]      Judy [name] was given information about Chronic Care Management services today including:  CCM service includes personalized support from designated clinical staff supervised by her physician, including individualized plan of care and coordination with other care providers 24/7 contact phone numbers for assistance for urgent and routine care needs. Standard insurance, coinsurance, copays and deductibles apply for chronic care management only during months in which we provide at least 20 minutes of these services. Most insurances cover these services at 100%, however patients may be responsible for any copay, coinsurance and/or deductible if applicable. This service may help you avoid the need for more expensive face-to-face services. Only one practitioner may furnish and bill the service in a calendar month. The patient  may stop CCM services at any time (effective at the end of the month) by phone call to the office staff.  Patient agreed to services and verbal consent obtained.   The patient verbalized understanding of instructions, educational materials, and care plan provided today and DECLINED offer to receive copy of patient instructions, educational materials, and care plan.  The pharmacy team will reach out to the patient again over the next 60 days.   Zettie Pho, [facility] [phone]

## 2021-12-22 ENCOUNTER — Telehealth: Payer: Self-pay

## 2021-12-22 NOTE — Chronic Care Management (AMB) (Signed)
12-22-2021: 2024 PAP completed and uploaded for farxiga.  Huey Romans [facility] Clinical Pharmacist Assistant [phone]

## 2022-01-03 DIAGNOSIS — M25511 Pain in right shoulder: Secondary | ICD-10-CM | POA: Diagnosis not present

## 2022-01-08 ENCOUNTER — Other Ambulatory Visit: Payer: Self-pay | Admitting: Family Medicine

## 2022-01-12 ENCOUNTER — Telehealth: Payer: Self-pay

## 2022-01-12 DIAGNOSIS — K219 Gastro-esophageal reflux disease without esophagitis: Secondary | ICD-10-CM | POA: Diagnosis not present

## 2022-01-12 DIAGNOSIS — R1310 Dysphagia, unspecified: Secondary | ICD-10-CM | POA: Diagnosis not present

## 2022-01-12 DIAGNOSIS — K649 Unspecified hemorrhoids: Secondary | ICD-10-CM | POA: Diagnosis not present

## 2022-01-12 DIAGNOSIS — E119 Type 2 diabetes mellitus without complications: Secondary | ICD-10-CM | POA: Diagnosis not present

## 2022-01-12 NOTE — Chronic Care Management (AMB) (Unsigned)
Stilito, entresto, trulicity

## 2022-01-13 DIAGNOSIS — E1169 Type 2 diabetes mellitus with other specified complication: Secondary | ICD-10-CM

## 2022-01-13 DIAGNOSIS — E785 Hyperlipidemia, unspecified: Secondary | ICD-10-CM

## 2022-01-13 DIAGNOSIS — I110 Hypertensive heart disease with heart failure: Secondary | ICD-10-CM

## 2022-01-13 DIAGNOSIS — I5022 Chronic systolic (congestive) heart failure: Secondary | ICD-10-CM

## 2022-01-13 DIAGNOSIS — J449 Chronic obstructive pulmonary disease, unspecified: Secondary | ICD-10-CM

## 2022-01-18 DIAGNOSIS — I509 Heart failure, unspecified: Secondary | ICD-10-CM | POA: Diagnosis not present

## 2022-01-18 DIAGNOSIS — K648 Other hemorrhoids: Secondary | ICD-10-CM | POA: Diagnosis not present

## 2022-01-18 DIAGNOSIS — J449 Chronic obstructive pulmonary disease, unspecified: Secondary | ICD-10-CM | POA: Diagnosis not present

## 2022-01-18 DIAGNOSIS — E119 Type 2 diabetes mellitus without complications: Secondary | ICD-10-CM | POA: Diagnosis not present

## 2022-01-18 DIAGNOSIS — I110 Hypertensive heart disease with heart failure: Secondary | ICD-10-CM | POA: Diagnosis not present

## 2022-01-18 DIAGNOSIS — K259 Gastric ulcer, unspecified as acute or chronic, without hemorrhage or perforation: Secondary | ICD-10-CM | POA: Diagnosis not present

## 2022-01-18 DIAGNOSIS — R1310 Dysphagia, unspecified: Secondary | ICD-10-CM | POA: Diagnosis not present

## 2022-01-18 DIAGNOSIS — K635 Polyp of colon: Secondary | ICD-10-CM | POA: Diagnosis not present

## 2022-01-18 DIAGNOSIS — K222 Esophageal obstruction: Secondary | ICD-10-CM | POA: Diagnosis not present

## 2022-01-18 DIAGNOSIS — K449 Diaphragmatic hernia without obstruction or gangrene: Secondary | ICD-10-CM | POA: Diagnosis not present

## 2022-01-18 DIAGNOSIS — K621 Rectal polyp: Secondary | ICD-10-CM | POA: Diagnosis not present

## 2022-01-18 DIAGNOSIS — K2970 Gastritis, unspecified, without bleeding: Secondary | ICD-10-CM | POA: Diagnosis not present

## 2022-01-18 DIAGNOSIS — Z86010 Personal history of colonic polyps: Secondary | ICD-10-CM | POA: Diagnosis not present

## 2022-01-18 LAB — HM COLONOSCOPY

## 2022-01-20 ENCOUNTER — Telehealth: Payer: Self-pay

## 2022-01-20 NOTE — Chronic Care Management (AMB) (Signed)
Faxed 2024 re-enrollment to [place] patient assistance for Farxiga.   Billee Cashing, CMA Clinical Pharmacist Assistant [phone]

## 2022-01-23 ENCOUNTER — Other Ambulatory Visit: Payer: Self-pay | Admitting: Physician Assistant

## 2022-02-02 ENCOUNTER — Ambulatory Visit (INDEPENDENT_AMBULATORY_CARE_PROVIDER_SITE_OTHER): Payer: Medicare HMO | Admitting: Family Medicine

## 2022-02-02 ENCOUNTER — Telehealth: Payer: Self-pay

## 2022-02-02 ENCOUNTER — Encounter: Payer: Self-pay | Admitting: Family Medicine

## 2022-02-02 VITALS — BP 126/78 | HR 71 | Temp 97.3°F | Ht 65.0 in | Wt 272.0 lb

## 2022-02-02 DIAGNOSIS — F410 Panic disorder [episodic paroxysmal anxiety] without agoraphobia: Secondary | ICD-10-CM | POA: Diagnosis not present

## 2022-02-02 DIAGNOSIS — R10816 Epigastric abdominal tenderness: Secondary | ICD-10-CM

## 2022-02-02 DIAGNOSIS — R0602 Shortness of breath: Secondary | ICD-10-CM | POA: Diagnosis not present

## 2022-02-02 MED ORDER — CLONAZEPAM 0.5 MG PO TABS: 0.5000 mg | ORAL_TABLET | Freq: Two times a day (BID) | ORAL | 1 refills | Status: DC | PRN

## 2022-02-02 NOTE — Chronic Care Management (AMB) (Signed)
BI cares notification  Patient Approved 02/14/2022 - 02/14/2023 for Stiolto.  [employer] Cares Notification:  Patient Approved 02/14/2022 - 02/14/2023 for Trulicity.    Billee Cashing, CMA Clinical Pharmacist Assistant [phone]

## 2022-02-02 NOTE — Progress Notes (Signed)
Acute Office Visit  Subjective:    Patient ID: Judy [name], female    DOB: [birth year], 61 y.o.   MRN: [id number]  Chief Complaint  Patient presents with   Shortness of breath    HPI Patient is in today for episodic dyspnea and patient checks her oxygen saturation 93-94. She tries her oxygen anyway and it does not help. Episodes last 30-45 minutes. Can be with exertion or not. No chest pain, but stomach hurts. No vomiting, but has felt nauseated. No diaphoresis. Approximately 3 times per week.   Past Medical History:  Diagnosis Date   Anxiety    Back pain    Bilateral swelling of feet    Chronic combined systolic and diastolic CHF (congestive heart failure) ([place]) 10/07/2014   COPD (chronic obstructive pulmonary disease) (HCC)    DCM (dilated cardiomyopathy) ([facility]) 04/25/2014   EF 28% by MRI despite maximum medical therapy   Depression    Diabetes mellitus without complication ([place])    Epilepsy ([place])    as a child   Fatty liver    Former tobacco use    Hyperlipidemia    Hypertension    Hypothyroidism    Joint pain    Leg swelling    Morbid obesity (HCC)    NICM (nonischemic cardiomyopathy) (HCC)    OSA (obstructive sleep apnea)    moderate with AHI 26/hr with oxygen desaturations as low as 70%   Scoliosis    Sleep apnea    SOB (shortness of breath)     Past Surgical History:  Procedure Laterality Date   ABDOMINAL HYSTERECTOMY     ankle artery involved cyst removal     CARDIAC CATHETERIZATION  05/14/2014   normal coronary arteries   CARPAL TUNNEL RELEASE     FASCIOTOMY     1993 for platar fasciitis   harrington rod scoliosis     1981   LEFT HEART CATHETERIZATION WITH CORONARY ANGIOGRAM N/A 05/14/2014   Procedure: LEFT HEART CATHETERIZATION WITH CORONARY ANGIOGRAM;  Surgeon: Wellington Hampshire, MD;  Location: [place] CATH LAB;  Service: Cardiovascular;  Laterality: N/A;   ROTATOR CUFF REPAIR     UMBILICAL HERNIA REPAIR     VESICOVAGINAL FISTULA CLOSURE W/ TAH       Family History  Problem Relation Age of Onset   Diabetes Mother    Hyperlipidemia Mother    Obesity Mother    Emphysema Father    Allergies Father    Heart failure Father    Heart disease Father 8       MI   Diabetes Father    Hyperlipidemia Father    Hypertension Father    Thyroid disease Father    Alcohol abuse Father    Obesity Father    Breast cancer Neg Hx     Social History   Socioeconomic History   Marital status: Married    Spouse name: Not on file   Number of children: Not on file   Years of education: Not on file   Highest education level: Not on file  Occupational History   Occupation: disabled  Tobacco Use   Smoking status: Former    Packs/day: 1.00    Years: 15.00    Total pack years: 15.00    Types: Cigarettes    Quit date: 02/14/2005    Years since quitting: 16.9   Smokeless tobacco: Never  Vaping Use   Vaping Use: Never used  Substance and Sexual Activity   Alcohol use: Yes  Comment: occ   Drug use: No   Sexual activity: Not on file  Other Topics Concern   Not on file  Social History Narrative   Lives in [place] with spouse and son.   Previously worked as a Engineer, manufacturing         Social Determinants of Radio broadcast assistant Strain: High Risk (12/21/2021)   Overall Financial Resource Strain (CARDIA)    Difficulty of Paying Living Expenses: Hard  Food Insecurity: No Food Insecurity (05/14/2020)   Hunger Vital Sign    Worried About Running Out of Food in the Last Year: Never true    [place] in the Last Year: Never true  Transportation Needs: No Transportation Needs (12/21/2021)   PRAPARE - Hydrologist (Medical): No    Lack of Transportation (Non-Medical): No  Physical Activity: Inactive (01/26/2021)   Exercise Vital Sign    Days of Exercise per Week: 0 days    Minutes of Exercise per Session: 0 min  Stress: Not on file  Social Connections: Not on file  Intimate Partner  Violence: Not At Risk (01/26/2021)   Humiliation, Afraid, Rape, and Kick questionnaire    Fear of Current or Ex-Partner: No    Emotionally Abused: No    Physically Abused: No    Sexually Abused: No    Outpatient Medications Prior to Visit  Medication Sig Dispense Refill   albuterol (VENTOLIN HFA) 108 (90 Base) MCG/ACT inhaler Inhale 2 puffs into the lungs every 6 (six) hours as needed for wheezing or shortness of breath. 8 g 2   aspirin 81 MG tablet Take 81 mg by mouth at bedtime.      atorvastatin (LIPITOR) 20 MG tablet TAKE 1 TABLET EVERY DAY 90 tablet 3   buPROPion (WELLBUTRIN XL) 300 MG 24 hr tablet TAKE 1 TABLET EVERY DAY 90 tablet 3   carvedilol (COREG) 6.25 MG tablet TAKE 1 TABLET TWICE DAILY 180 tablet 2   Coenzyme Q10 (CO Q 10) 100 MG CAPS Take 200 mg by mouth daily.     diphenhydrAMINE (BENADRYL) 25 MG tablet Take 50 mg by mouth at bedtime as needed.     Dulaglutide (TRULICITY) 3 XB/2.8UX SOPN Inject 3 mg as directed once a week. 2 mL 0   FARXIGA 10 MG TABS tablet Take 1 tablet (10 mg total) by mouth daily. 90 tablet 3   fluticasone (FLONASE) 50 MCG/ACT nasal spray SPRAY 2 SPRAYS INTO EACH NOSTRIL EVERY DAY 48 mL 2   gabapentin (NEURONTIN) 800 MG tablet TAKE 1 TABLET THREE TIMES DAILY 270 tablet 3   ipratropium-albuterol (DUONEB) 0.5-2.5 (3) MG/3ML SOLN Take 3 mLs by nebulization every 4 (four) hours as needed. 120 mL 0   levothyroxine (SYNTHROID) 75 MCG tablet TAKE 1 TABLET (75 MCG TOTAL) BY MOUTH DAILY. 90 tablet 3   Lutein 20 MG TABS Take 1 tablet by mouth daily.     meloxicam (MOBIC) 15 MG tablet Take 1 tablet (15 mg total) by mouth daily. 90 tablet 3   Multiple Vitamin (MULTIVITAMIN WITH MINERALS) TABS tablet Take 1 tablet by mouth daily.     omeprazole (PRILOSEC) 20 MG capsule Take 20 mg by mouth daily.     OXYGEN 2lpm 2 lpm as needed     sacubitril-valsartan (ENTRESTO) 24-26 MG Take 1 tablet by mouth 2 (two) times daily. 180 tablet 3   Tiotropium Bromide-Olodaterol  (STIOLTO RESPIMAT) 2.5-2.5 MCG/ACT AERS INHALE 2 PUFFS INTO THE LUNGS  DAILY. 12 g 5   tiZANidine (ZANAFLEX) 4 MG tablet TAKE 1 TABLET EVERY 6 HOURS AS NEEDED FOR MUSCLE SPASM(S) 360 tablet 3   traMADol (ULTRAM) 50 MG tablet Take 1 tablet (50 mg total) by mouth every 6 (six) hours as needed for severe pain. Take one four times a day as needed for pain 360 tablet 0   UNABLE TO FIND CPAP with o2 2lpm  DME- [facility]     VASCEPA 1 g capsule TAKE 2 CAPSULES BY MOUTH TWICE A DAY 360 capsule 1   Vitamin D, Ergocalciferol, (DRISDOL) 1.25 MG (50000 UNIT) CAPS capsule TAKE 1 CAPSULE EVERY 7 DAYS 12 capsule 3   clonazePAM (KLONOPIN) 0.5 MG tablet Take 1 tablet (0.5 mg total) by mouth 2 (two) times daily as needed for anxiety (panic attacks). 180 tablet 0   ipratropium-albuterol (DUONEB) 0.5-2.5 (3) MG/3ML nebulizer solution 3 mL      No facility-administered medications prior to visit.    Allergies  Allergen Reactions   Demerol [Meperidine] Nausea And Vomiting    Review of Systems  Constitutional:  Negative for appetite change, fatigue and fever.  HENT:  Negative for congestion, ear pain, sinus pressure and sore throat.   Respiratory:  Positive for shortness of breath (Episodes that come and go since having her colonoscopy.). Negative for cough, chest tightness and wheezing.   Cardiovascular:  Negative for chest pain and palpitations.  Gastrointestinal:  Negative for abdominal pain, constipation, diarrhea, nausea and vomiting.  Genitourinary:  Negative for dysuria and hematuria.  Musculoskeletal:  Negative for arthralgias, back pain, joint swelling and myalgias.  Skin:  Negative for rash.  Neurological:  Negative for dizziness, weakness and headaches.  Psychiatric/Behavioral:  Negative for dysphoric mood. The patient is not nervous/anxious.        Objective:    Physical Exam Vitals reviewed.  Constitutional:      Appearance: Normal appearance. She is obese.  Cardiovascular:     Rate and Rhythm:  Normal rate and regular rhythm.     Heart sounds: Normal heart sounds.  Pulmonary:     Effort: Pulmonary effort is normal.     Breath sounds: Normal breath sounds.  Abdominal:     General: Abdomen is flat. Bowel sounds are normal.     Palpations: Abdomen is soft.     Tenderness: There is no abdominal tenderness.  Neurological:     Mental Status: She is alert and oriented to person, place, and time.  Psychiatric:        Mood and Affect: Mood normal.        Behavior: Behavior normal.     BP 126/78 (BP Location: Right Arm, Patient Position: Sitting)   Pulse 71   Temp (!) 97.3 F (36.3 C) (Temporal)   Ht _0  (1.651 m)   Wt 272 lb (123.4 kg)   SpO2 95%   BMI 45.26 kg/m  Wt Readings from Last 3 Encounters:  02/02/22 272 lb (123.4 kg)  12/16/21 273 lb (123.8 kg)  11/02/21 275 lb (124.7 kg)    Health Maintenance Due  Topic Date Due   Diabetic kidney evaluation - Urine ACR  Never done   Hepatitis C Screening  Never done   Zoster Vaccines- Shingrix (1 of 2) Never done   FOOT EXAM  07/09/2021   COLONOSCOPY (Pts 45-55yr Insurance coverage will need to be confirmed)  12/14/2021   Medicare Annual Wellness (AWV)  01/26/2022   COVID-19 Vaccine (6 - 2023-24 season) 02/10/2022  There are no preventive care reminders to display for this patient.   Lab Results  Component Value Date   TSH 1.400 12/13/2021   Lab Results  Component Value Date   WBC 5.7 12/13/2021   HGB 13.9 12/13/2021   HCT 41.1 12/13/2021   MCV 93 12/13/2021   PLT 225 12/13/2021   Lab Results  Component Value Date   NA 146 (H) 12/13/2021   K 4.3 12/13/2021   CO2 24 12/13/2021   GLUCOSE 78 12/13/2021   BUN 14 12/13/2021   CREATININE 0.76 12/13/2021   BILITOT 0.7 12/13/2021   ALKPHOS 149 (H) 12/13/2021   AST 22 12/13/2021   ALT 43 (H) 12/13/2021   PROT 6.0 12/13/2021   ALBUMIN 4.0 12/13/2021   CALCIUM 9.2 12/13/2021   ANIONGAP 11 02/27/2021   EGFR 89 12/13/2021   GFR 100.87 10/01/2014   Lab  Results  Component Value Date   CHOL 130 12/13/2021   Lab Results  Component Value Date   HDL 42 12/13/2021   Lab Results  Component Value Date   LDLCALC 65 12/13/2021   Lab Results  Component Value Date   TRIG 128 12/13/2021   Lab Results  Component Value Date   CHOLHDL 3.1 12/13/2021   Lab Results  Component Value Date   HGBA1C 6.0 (H) 12/13/2021         Assessment & Plan:   Problem List Items Addressed This Visit       Other   Shortness of breath - Primary    Concerning for anginal equivalent. Dr. Radford Pax scheduling a cta of coronaries.  EKG: unchanged from previous EKG.  Labs ordered stat. Troponin came back normal.  NTG sent.       Relevant Orders   EKG 12-Lead (Completed)   Panic attack    Prescription: clonazepam for panic attacks.      Relevant Medications   clonazePAM (KLONOPIN) 0.5 MG tablet   Epigastric abdominal tenderness without rebound tenderness    Concerning for anginal equivalent. Dr. Radford Pax scheduling a cta of coronaries.        Meds ordered this encounter  Medications   clonazePAM (KLONOPIN) 0.5 MG tablet    Sig: Take 1 tablet (0.5 mg total) by mouth 2 (two) times daily as needed for anxiety (panic attacks).    Dispense:  30 tablet    Refill:  1   I,Lauren M Auman,acting as a scribe for Rochel Brome, MD.,have documented all relevant documentation on the behalf of Rochel Brome, MD,as directed by  Rochel Brome, MD while in the presence of Rochel Brome, MD.   Follow up: as needed   Rochel Brome, MD

## 2022-02-03 ENCOUNTER — Telehealth: Payer: Self-pay

## 2022-02-03 ENCOUNTER — Other Ambulatory Visit: Payer: Self-pay | Admitting: Family Medicine

## 2022-02-03 DIAGNOSIS — R1013 Epigastric pain: Secondary | ICD-10-CM

## 2022-02-03 DIAGNOSIS — R0600 Dyspnea, unspecified: Secondary | ICD-10-CM

## 2022-02-03 MED ORDER — NITROGLYCERIN 0.4 MG SL SUBL: 0.4000 mg | SUBLINGUAL_TABLET | SUBLINGUAL | 0 refills | Status: DC | PRN

## 2022-02-03 NOTE — Telephone Encounter (Signed)
-----   Message from Quintella Reichert, MD sent at 02/03/2022 12:37 PM EST ----- PCP called and stated patient has been having DOE and epigastric pain - can you get a coronary CTA set up for possible tomorrow or Tuesday?

## 2022-02-03 NOTE — Telephone Encounter (Signed)
-----   Message from Traci R Turner, MD sent at 02/03/2022 12:37 PM EST ----- PCP called and stated patient has been having DOE and epigastric pain - can you get a coronary CTA set up for possible tomorrow or Tuesday?  

## 2022-02-03 NOTE — Telephone Encounter (Signed)
Spoke with patient. Patient is willing to complete Coronary CTA, states Tuesday 02/08/22 is probably the earliest she can do it. She states she did BMP at PCP Dr. Fritzi Mandes Cox's office yesterday. Called Dr. Renea Ee office to request those results. Spoke to [place] who located labs and stated she would fax.

## 2022-02-03 NOTE — Telephone Encounter (Signed)
Hi Jeanne,  I am working on getting your lab work so we can check your kidneys before your coronary CTA as we may be able to schedule it for as early as Tuesday, 02/08/22. You may receive a call from the Imaging department to go ahead and schedule your CTA.  Here are the instructions for this procedure.     Your cardiac CT will be scheduled at one of the below locations:   [facility] [address] [place], [place] [address] [phone]   If scheduled at [facility], please arrive at the [facility] and Children's Entrance (Entrance C2) of [facility] 30 minutes prior to test start time. You can use the FREE valet parking offered at entrance C (encouraged to control the heart rate for the test)  Proceed to the [facility] Radiology Department (first floor) to check-in and test prep.  All radiology patients and guests should use entrance C2 at [facility], accessed from [facility], even though the hospital's physical address listed is [address].    Please follow these instructions carefully (unless otherwise directed):  Hold all erectile dysfunction medications at least 3 days (72 hrs) prior to test. (Ie viagra, cialis, sildenafil, tadalafil, etc) We will administer nitroglycerin during this exam.   On the Night Before the Test: Be sure to Drink plenty of water. Do not consume any caffeinated/decaffeinated beverages or chocolate 12 hours prior to your test. Do not take any antihistamines 12 hours prior to your test.   On the Day of the Test: Drink plenty of water until 1 hour prior to the test. Do not eat any food 1 hour prior to test. You may take your regular medications prior to the test.  FEMALES- please wear underwire-free bra if available, avoid dresses & tight clothing       After the Test: Drink plenty of water. After receiving IV contrast, you may experience a mild flushed feeling. This is normal. On  occasion, you may experience a mild rash up to 24 hours after the test. This is not dangerous. If this occurs, you can take Benadryl 25 mg and increase your fluid intake. If you experience trouble breathing, this can be serious. If it is severe call 911 IMMEDIATELY. If it is mild, please call our office. If you take any of these medications: Glipizide/Metformin, Avandament, Glucavance, please do not take 48 hours after completing test unless otherwise instructed.  We will call to schedule your test 2-4 weeks out understanding that some insurance companies will need an authorization prior to the service being performed.   For non-scheduling related questions, please contact the cardiac imaging nurse navigator should you have any questions/concerns: Rockwell [place], Cardiac Imaging Nurse Navigator Larey Brick, Cardiac Imaging Nurse Navigator [facility] Direct Office Dial: [phone]   For scheduling needs, including cancellations and rescheduling, please call [place], [phone].

## 2022-02-04 ENCOUNTER — Telehealth: Payer: Self-pay | Admitting: Cardiology

## 2022-02-04 NOTE — Telephone Encounter (Signed)
Marcelino Duster - [employer] calling, she said, she needs to speak with Dr, Norris Cross nurse regarding pt's CT. She said when they call back to make sure to have pt's [employer] ID number

## 2022-02-04 NOTE — Telephone Encounter (Signed)
Called back and spoke with Judy [name] regarding information pertaining to auth for Coronary CT - CP/DOE.   Judy [name] reports she will be faxing the authorization over to the office shortly.

## 2022-02-08 ENCOUNTER — Telehealth (HOSPITAL_COMMUNITY): Payer: Self-pay | Admitting: *Deleted

## 2022-02-08 DIAGNOSIS — R10816 Epigastric abdominal tenderness: Secondary | ICD-10-CM | POA: Insufficient documentation

## 2022-02-08 DIAGNOSIS — F410 Panic disorder [episodic paroxysmal anxiety] without agoraphobia: Secondary | ICD-10-CM | POA: Insufficient documentation

## 2022-02-08 DIAGNOSIS — R0602 Shortness of breath: Secondary | ICD-10-CM | POA: Insufficient documentation

## 2022-02-08 MED ORDER — METOPROLOL TARTRATE 100 MG PO TABS: ORAL_TABLET | ORAL | 0 refills | Status: DC

## 2022-02-08 NOTE — Assessment & Plan Note (Signed)
Prescription: clonazepam for panic attacks.

## 2022-02-08 NOTE — Assessment & Plan Note (Addendum)
Concerning for anginal equivalent. Dr. Mayford Knife scheduling a cta of coronaries.  EKG: unchanged from previous EKG.  Labs ordered stat. Troponin came back normal.  NTG sent.

## 2022-02-08 NOTE — Telephone Encounter (Signed)
Reaching out to patient to offer assistance regarding upcoming cardiac imaging study; pt verbalizes understanding of appt date/time, parking situation and where to check in, pre-test NPO status and medications ordered, and verified current allergies; name and call back number provided for further questions should they arise  Larey Brick RN Navigator Cardiac Imaging [facility] [phone] office [phone] cell  Patient to take 100mg  metoprolol tartrate two hours prior to her cardiac CT scan. She will not take her AM BP medications before the scan and is aware to arrive at 7:30am.

## 2022-02-08 NOTE — Assessment & Plan Note (Signed)
Concerning for anginal equivalent. Dr. Mayford Knife scheduling a cta of coronaries.

## 2022-02-09 ENCOUNTER — Encounter: Payer: Self-pay | Admitting: Cardiology

## 2022-02-09 ENCOUNTER — Ambulatory Visit (HOSPITAL_COMMUNITY)
Admission: RE | Admit: 2022-02-09 | Discharge: 2022-02-09 | Disposition: A | Discharge status: Home / Self Care | Payer: Medicare HMO | Source: Ambulatory Visit | Attending: Cardiology | Admitting: Cardiology

## 2022-02-09 DIAGNOSIS — R0600 Dyspnea, unspecified: Secondary | ICD-10-CM | POA: Diagnosis not present

## 2022-02-09 DIAGNOSIS — R1013 Epigastric pain: Secondary | ICD-10-CM | POA: Insufficient documentation

## 2022-02-09 DIAGNOSIS — I2510 Atherosclerotic heart disease of native coronary artery without angina pectoris: Secondary | ICD-10-CM | POA: Insufficient documentation

## 2022-02-09 DIAGNOSIS — I25119 Atherosclerotic heart disease of native coronary artery with unspecified angina pectoris: Secondary | ICD-10-CM | POA: Insufficient documentation

## 2022-02-09 DIAGNOSIS — R0602 Shortness of breath: Secondary | ICD-10-CM | POA: Diagnosis not present

## 2022-02-09 MED ORDER — NITROGLYCERIN 0.4 MG SL SUBL
0.8000 mg | SUBLINGUAL_TABLET | Freq: Once | SUBLINGUAL | Status: AC
  Administered 2022-02-09: 0.8 mg via SUBLINGUAL

## 2022-02-09 MED ORDER — IOHEXOL 350 MG/ML SOLN
95.0000 mL | Freq: Once | INTRAVENOUS | Status: AC | PRN
  Administered 2022-02-09: 95 mL via INTRAVENOUS

## 2022-02-09 MED ORDER — NITROGLYCERIN 0.4 MG SL SUBL
SUBLINGUAL_TABLET | SUBLINGUAL | Status: AC
  Filled 2022-02-09: qty 2

## 2022-02-10 ENCOUNTER — Telehealth: Payer: Self-pay

## 2022-02-10 DIAGNOSIS — R0600 Dyspnea, unspecified: Secondary | ICD-10-CM

## 2022-02-10 NOTE — Telephone Encounter (Signed)
The patient has been notified of the result and verbalized understanding.  All questions (if any) were answered. Frutoso Schatz, RN 02/10/2022 11:23 AM  Echocardiogram has been ordered.

## 2022-02-10 NOTE — Telephone Encounter (Signed)
-----   Message from Quintella Reichert, MD sent at 02/09/2022  5:20 PM EST ----- Minimal < 25% plaque in the mid RCA, LAD and OM1 with coronary Ca score of 1.23. Lipids at goal.  Continue ASA and statin.  SOB is not from CAD.  Please get a 2D echo to assess LVF- if normal could consider CPXT

## 2022-02-11 ENCOUNTER — Telehealth: Payer: Self-pay

## 2022-02-11 NOTE — Chronic Care Management (AMB) (Signed)
[employer] Notification:  Patient approved to receive Entresto until 02/14/2023.   Billee Cashing, CMA Clinical Pharmacist Assistant [phone]

## 2022-02-24 ENCOUNTER — Telehealth: Payer: Self-pay

## 2022-02-24 NOTE — Progress Notes (Cosign Needed Addendum)
Patient approved to receive Trulicity through [employer] Patient assistance program for 2024 calendar year.   Patient approved to receive Stiolto through [employer] patient assistance program from 02/14/2022- 02/14/2023.  Pattricia Boss, [place] Pharmacist Assistant [phone]

## 2022-02-25 ENCOUNTER — Ambulatory Visit: Payer: Medicare HMO | Admitting: Cardiology

## 2022-03-02 ENCOUNTER — Encounter: Payer: Self-pay | Admitting: Family Medicine

## 2022-03-07 ENCOUNTER — Ambulatory Visit: Payer: Medicare HMO | Attending: Cardiology | Admitting: Cardiology

## 2022-03-07 ENCOUNTER — Encounter: Payer: Self-pay | Admitting: Cardiology

## 2022-03-07 VITALS — BP 120/70 | HR 94 | Ht 65.0 in | Wt 269.2 lb

## 2022-03-07 DIAGNOSIS — I2583 Coronary atherosclerosis due to lipid rich plaque: Secondary | ICD-10-CM

## 2022-03-07 DIAGNOSIS — I10 Essential (primary) hypertension: Secondary | ICD-10-CM

## 2022-03-07 DIAGNOSIS — E7800 Pure hypercholesterolemia, unspecified: Secondary | ICD-10-CM | POA: Diagnosis not present

## 2022-03-07 DIAGNOSIS — I5032 Chronic diastolic (congestive) heart failure: Secondary | ICD-10-CM

## 2022-03-07 DIAGNOSIS — I420 Dilated cardiomyopathy: Secondary | ICD-10-CM | POA: Diagnosis not present

## 2022-03-07 DIAGNOSIS — I2510 Atherosclerotic heart disease of native coronary artery without angina pectoris: Secondary | ICD-10-CM

## 2022-03-07 DIAGNOSIS — G4733 Obstructive sleep apnea (adult) (pediatric): Secondary | ICD-10-CM | POA: Diagnosis not present

## 2022-03-07 NOTE — Progress Notes (Addendum)
Date:  03/07/2022   ID:  Judy [name], DOB November 20, 1960, MRN [id number] The patient was identified using 2 identifiers.  PCP:  Blane Ohara, MD   [facility] Providers Cardiologist:  Armanda Magic, MD     Evaluation Performed:  Follow-Up Visit  Chief Complaint:  CHF, DCM, HTN, OSA  History of Present Illness:    Judy [name] is a 62 y.o. female with a hx of  NICM, chronic combined CHF, OSA on PAP, COPD, former tobacco abuse, HTN, HLD, morbid obesity. Prior echo 12/2013 showed EF 45-50%. She had abnormal stress test 04/2014 resulting in cardiac cath 05/14/14 showing no significant CAD, EF 30%.     Subsequent echo 08/2014 (poor quality) with EF approximately 35%. She was treated with medical therapy and subsequent cMRI 09/2014 showed diffuse HK, EF 28%, no infiltrative disease.  AICD recommended but she had been hesitant to pursue. F/U echo was obtained 07/29/16 showed EF approximately 45-50%, mild LVH, grade 2 DD, mild MR, mildly reduced RV function, PASP .  Repeat echo 06/19/2017 showed normalization of LV function with EF 55%.  Coronary CTA 12/23 for CP showed minimal < 25% plaque in the mid RCA, LAD and OM1 with coronary Ca score of 1.23. She has an echo pending next week.   She is here today for followup and is doing well.  She recently called in with some DOE and epigastric pain and got a coronary CTA that showed minimal nonobstructive CAD.  She has a 2D echo pending.  She has not had any further symptoms.  She has chronic DOE that is stable unless she starts to rush around.  She denies any chest pain or pressure, PND, orthopnea, LE edema, dizziness, palpitations or syncope. She is compliant with her meds and is tolerating meds with no SE.    She is doing well with her PAP device and thinks that she has gotten used to it.  She tolerates the mask and feels the pressure is adequate.  Since going on PAP she feels rested in the am and has no significant daytime sleepiness.  She does  have some problems with mouth dryness.  She does not think that she snores.    Past Medical History:  Diagnosis Date   Anxiety    Back pain    Bilateral swelling of feet    CAD (coronary artery disease), native coronary artery    coronary CTA 01/2022 < 25% plaque in the mid RCA, LAD and OM1 with coronary Ca score of 1.23.   Chronic combined systolic and diastolic CHF (congestive heart failure) (HCC) 10/07/2014   COPD (chronic obstructive pulmonary disease) (HCC)    DCM (dilated cardiomyopathy) (HCC) 04/25/2014   EF 28% by MRI despite maximum medical therapy   Depression    Diabetes mellitus without complication (HCC)    Epilepsy (HCC)    as a child   Fatty liver    Former tobacco use    Hyperlipidemia    Hypertension    Hypothyroidism    Joint pain    Leg swelling    Morbid obesity (HCC)    NICM (nonischemic cardiomyopathy) (HCC)    OSA (obstructive sleep apnea)    moderate with AHI 26/hr with oxygen desaturations as low as 70%   Scoliosis    Sleep apnea    SOB (shortness of breath)    Past Surgical History:  Procedure Laterality Date   ABDOMINAL HYSTERECTOMY     ankle artery involved cyst removal  CARDIAC CATHETERIZATION  05/14/2014   normal coronary arteries   CARPAL TUNNEL RELEASE     FASCIOTOMY     1993 for platar fasciitis   harrington rod scoliosis     1981   LEFT HEART CATHETERIZATION WITH CORONARY ANGIOGRAM N/A 05/14/2014   Procedure: LEFT HEART CATHETERIZATION WITH CORONARY ANGIOGRAM;  Surgeon: Wellington Hampshire, MD;  Location: [facility] CATH [facility];  Service: Cardiovascular;  Laterality: N/A;   ROTATOR CUFF REPAIR     UMBILICAL HERNIA REPAIR     VESICOVAGINAL FISTULA CLOSURE W/ TAH       Current Meds  Medication Sig   albuterol (VENTOLIN HFA) 108 (90 Base) MCG/ACT inhaler Inhale 2 puffs into the lungs every 6 (six) hours as needed for wheezing or shortness of breath.   aspirin 81 MG tablet Take 81 mg by mouth at bedtime.    atorvastatin (LIPITOR) 20 MG tablet  TAKE 1 TABLET EVERY DAY   buPROPion (WELLBUTRIN XL) 300 MG 24 hr tablet TAKE 1 TABLET EVERY DAY   carvedilol (COREG) 6.25 MG tablet TAKE 1 TABLET TWICE DAILY   clonazePAM (KLONOPIN) 0.5 MG tablet Take 1 tablet (0.5 mg total) by mouth 2 (two) times daily as needed for anxiety (panic attacks).   Coenzyme Q10 (CO Q 10) 100 MG CAPS Take 200 mg by mouth daily.   diphenhydrAMINE (BENADRYL) 25 MG tablet Take 50 mg by mouth at bedtime as needed.   Dulaglutide (TRULICITY) 3 WU/9.8JX SOPN Inject 3 mg as directed once a week.   FARXIGA 10 MG TABS tablet Take 1 tablet (10 mg total) by mouth daily.   fluticasone (FLONASE) 50 MCG/ACT nasal spray SPRAY 2 SPRAYS INTO EACH NOSTRIL EVERY DAY   gabapentin (NEURONTIN) 800 MG tablet TAKE 1 TABLET THREE TIMES DAILY   ipratropium-albuterol (DUONEB) 0.5-2.5 (3) MG/3ML SOLN Take 3 mLs by nebulization every 4 (four) hours as needed.   levothyroxine (SYNTHROID) 75 MCG tablet TAKE 1 TABLET (75 MCG TOTAL) BY MOUTH DAILY.   Lutein 20 MG TABS Take 1 tablet by mouth daily.   meloxicam (MOBIC) 15 MG tablet Take 1 tablet (15 mg total) by mouth daily.   Multiple Vitamin (MULTIVITAMIN WITH MINERALS) TABS tablet Take 1 tablet by mouth daily.   nitroGLYCERIN (NITROSTAT) 0.4 MG SL tablet Place 1 tablet (0.4 mg total) under the tongue every 5 (five) minutes as needed for chest pain.   omeprazole (PRILOSEC) 20 MG capsule Take 20 mg by mouth daily.   OXYGEN 2lpm 2 lpm as needed   sacubitril-valsartan (ENTRESTO) 24-26 MG Take 1 tablet by mouth 2 (two) times daily.   Tiotropium Bromide-Olodaterol (STIOLTO RESPIMAT) 2.5-2.5 MCG/ACT AERS INHALE 2 PUFFS INTO THE LUNGS DAILY.   tiZANidine (ZANAFLEX) 4 MG tablet TAKE 1 TABLET EVERY 6 HOURS AS NEEDED FOR MUSCLE SPASM(S)   traMADol (ULTRAM) 50 MG tablet Take 1 tablet (50 mg total) by mouth every 6 (six) hours as needed for severe pain. Take one four times a day as needed for pain   UNABLE TO FIND CPAP with o2 2lpm  DME- [facility]   VASCEPA 1 g  capsule TAKE 2 CAPSULES BY MOUTH TWICE A DAY   Vitamin D, Ergocalciferol, (DRISDOL) 1.25 MG (50000 UNIT) CAPS capsule TAKE 1 CAPSULE EVERY 7 DAYS     Allergies:   Demerol [meperidine]   Social History   Tobacco Use   Smoking status: Former    Packs/day: 1.00    Years: 15.00    Total pack years: 15.00    Types:  Cigarettes    Quit date: 02/14/2005    Years since quitting: 17.0   Smokeless tobacco: Never  Vaping Use   Vaping Use: Never used  Substance Use Topics   Alcohol use: Yes    Comment: occ   Drug use: No     Family Hx: The patient's family history includes Alcohol abuse in her father; Allergies in her father; Diabetes in her father and mother; Emphysema in her father; Heart disease (age of onset: 85) in her father; Heart failure in her father; Hyperlipidemia in her father and mother; Hypertension in her father; Obesity in her father and mother; Thyroid disease in her father. There is no history of Breast cancer.  ROS:   Please see the history of present illness.     All other systems reviewed and are negative.   Prior CV studies:   The following studies were reviewed today:  PAP compliance download  Labs/Other Tests and Data Reviewed:    EKG:  No ECG reviewed.  Recent Labs: 12/13/2021: ALT 43; BUN 14; Creatinine, Ser 0.76; Hemoglobin 13.9; Platelets 225; Potassium 4.3; Sodium 146; TSH 1.400   Recent Lipid Panel Lab Results  Component Value Date/Time   CHOL 130 12/13/2021 08:44 AM   TRIG 128 12/13/2021 08:44 AM   HDL 42 12/13/2021 08:44 AM   CHOLHDL 3.1 12/13/2021 08:44 AM   CHOLHDL 3.6 07/12/2016 11:15 AM   LDLCALC 65 12/13/2021 08:44 AM    Wt Readings from Last 3 Encounters:  03/07/22 269 lb 3.2 oz (122.1 kg)  02/02/22 272 lb (123.4 kg)  12/16/21 273 lb (123.8 kg)     Risk Assessment/Calculations:          Objective:    Vital Signs:  BP 120/70   Pulse 94   Ht 5\' 5"  (1.651 m)   Wt 269 lb 3.2 oz (122.1 kg)   SpO2 92%   BMI 44.80 kg/m   GEN:  Well nourished, well developed in no acute distress HEENT: Normal NECK: No JVD; No carotid bruits LYMPHATICS: No lymphadenopathy CARDIAC:RRR, no murmurs, rubs, gallops RESPIRATORY:  Clear to auscultation without rales, wheezing or rhonchi  ABDOMEN: Soft, non-tender, non-distended MUSCULOSKELETAL:  No edema; No deformity  SKIN: Warm and dry NEUROLOGIC:  Alert and oriented x 3 PSYCHIATRIC:  Normal affect  ASSESSMENT & PLAN:    1.  Chronic diastolic CHF -she is currently NYHA class 2b -she has chronic DOE which is multifactorial from COPD, CHF as well as morbid obesity -Her weight remains stable as well as lower extremity edema and shortness of breath -She has chronic shortness of breath with COPD and is followed by pulmonary -Continue prescription drug management carvedilol 6.25 mg twice daily, Entresto 24-26 mg twice daily, Farxiga 10 mg daily with as needed refills -She has not required any diuretics -I have personally reviewed and interpreted outside labs performed by patient's PCP which showed serum creatinine 0.76, potassium 4.3 on 12/13/2021   2.  Nonischemic DCM -essentially normal coronary arteries by cath 2016 -DCM resolved with EF 55% on echo 2018 -carvedilol was stopped due to hypotension but then restarted at a lower dose due to tachycardia -Continue prescription drug management carvedilol 6.25 mg twice daily, Entresto 24-26 mg twice daily and Farxiga 10 mg daily with as needed refills   3.  HTN -BP is adequately controlled on exam today -Continue prescription drug management with Entresto 24-26 mg twice daily and carvedilol 6.25 mg twice daily with as needed refills   4.  OSA -  The patient is tolerating PAP therapy well without any problems. The PAP download performed by his DME was personally reviewed and interpreted by me today and showed an AHI of 0.5 /hr on 13 cm H2O with 100% compliance in using more than 4 hours nightly.  The patient has been using and benefiting from  PAP use and will continue to benefit from therapy.   5.  ASCAD -coronary CTA 12/23 showed Minimal < 25% plaque in the mid RCA, LAD and OM1 with coronary Ca score of 1.23.  -she has not had any further CP -continue prescription drug management with ASA 81mg  daily and Atorvastatin 20mg  daily with PRN refills  6.  HLD -LDL goal < 70 -I have personally reviewed and interpreted outside labs performed by patient's PCP which showed LDL 65 and HDL 42 in Oct 2023 -continue prescription drug management with Atorvastatin 20mg  daily with PRN refills     Medication Adjustments/Labs and Tests Ordered: Current medicines are reviewed at length with the patient today.  Concerns regarding medicines are outlined above.   Tests Ordered: No orders of the defined types were placed in this encounter.   Medication Changes: No orders of the defined types were placed in this encounter.   Follow Up:  In Person in 1 year(s)  Signed, Fransico Him, MD  03/07/2022 11:29 AM    [place]

## 2022-03-07 NOTE — Patient Instructions (Signed)
Medication Instructions:  Your physician recommends that you continue on your current medications as directed. Please refer to the Current Medication list given to you today.  *If you need a refill on your cardiac medications before your next appointment, please call your pharmacy*   Lab Work: None.  If you have labs (blood work) drawn today and your tests are completely normal, you will receive your results only by: MyChart Message (if you have MyChart) OR A paper copy in the mail If you have any lab test that is abnormal or we need to change your treatment, we will call you to review the results.   Testing/Procedures: None.   Follow-Up: At [facility], you and your health needs are our priority.  As part of our continuing mission to provide you with exceptional heart care, we have created designated Provider Care Teams.  These Care Teams include your primary Cardiologist (physician) and Advanced Practice Providers (APPs -  Physician Assistants and Nurse Practitioners) who all work together to provide you with the care you need, when you need it.  We recommend signing up for the patient portal called "MyChart".  Sign up information is provided on this After Visit Summary.  MyChart is used to connect with patients for Virtual Visits (Telemedicine).  Patients are able to view lab/test results, encounter notes, upcoming appointments, etc.  Non-urgent messages can be sent to your provider as well.   To learn more about what you can do with MyChart, go to [email or web address].    Your next appointment:   1 year(s)  Provider:   Traci Turner, MD     

## 2022-03-08 ENCOUNTER — Telehealth: Payer: Self-pay

## 2022-03-08 NOTE — Progress Notes (Signed)
Care Management & Coordination Services Pharmacy Team  Reason for Encounter: Hypertension  Contacted patient to discuss hypertension disease state.   Current antihypertensive regimen:  Carvedilol 6.25mg  twice daily  Sacubitrill-Valsartan 24-26mg  two times daily  Patient verbally confirms she is taking the above medications as directed. Yes  How often are you checking your Blood Pressure? when feeling symptomatic  Current home BP readings: 03/08/22 116/71 85 03/07/22 120/70 94  Wrist or arm cuff: Arm  Caffeine intake:Tea 3x times a week  Salt intake:Limited OTC medications including pseudoephedrine or NSAIDs? Tylenol PRN  Any readings above 180/120? No  What recent interventions/DTPs have been made by any provider to improve Blood Pressure control since last CPP Visit: No changes   Any recent hospitalizations or ED visits since last visit with CPP? No  What diet changes have been made to improve Blood Pressure Control?  No changes   What exercise is being done to improve your Blood Pressure Control?  Pt does chair exercise 2-3 times a week   Adherence Review: Is the patient currently on ACE/ARB medication? Yes Does the patient have >5 day gap between last estimated fill dates? No  Star Rating Drugs:  Medication:  Last Fill: Day Supply Sacubitrill-Valsartan Gets PAP LF 01/20/22 90ds  Chart Updates: Recent office visits:  02/03/22 Rochel Brome MD. Orders Only. Started on Nitrogylcerin 0.4mg .   02/02/22 Rochel Brome MD. Seen for SOB. Ordered Clonazepam 0.5mg .   Recent consult visits:  03/07/22 (Cardiology) Fransico Him MD. Seen for CHF. D/C Metoprolol Tartrate 100mg .   Hospital visits:  None  Medications: Outpatient Encounter Medications as of 03/08/2022  Medication Sig   albuterol (VENTOLIN HFA) 108 (90 Base) MCG/ACT inhaler Inhale 2 puffs into the lungs every 6 (six) hours as needed for wheezing or shortness of breath.   aspirin 81 MG tablet Take 81 mg by mouth at  bedtime.    atorvastatin (LIPITOR) 20 MG tablet TAKE 1 TABLET EVERY DAY   buPROPion (WELLBUTRIN XL) 300 MG 24 hr tablet TAKE 1 TABLET EVERY DAY   carvedilol (COREG) 6.25 MG tablet TAKE 1 TABLET TWICE DAILY   clonazePAM (KLONOPIN) 0.5 MG tablet Take 1 tablet (0.5 mg total) by mouth 2 (two) times daily as needed for anxiety (panic attacks).   Coenzyme Q10 (CO Q 10) 100 MG CAPS Take 200 mg by mouth daily.   diphenhydrAMINE (BENADRYL) 25 MG tablet Take 50 mg by mouth at bedtime as needed.   Dulaglutide (TRULICITY) 3 FI/4.3PI SOPN Inject 3 mg as directed once a week.   FARXIGA 10 MG TABS tablet Take 1 tablet (10 mg total) by mouth daily.   fluticasone (FLONASE) 50 MCG/ACT nasal spray SPRAY 2 SPRAYS INTO EACH NOSTRIL EVERY DAY   gabapentin (NEURONTIN) 800 MG tablet TAKE 1 TABLET THREE TIMES DAILY   ipratropium-albuterol (DUONEB) 0.5-2.5 (3) MG/3ML SOLN Take 3 mLs by nebulization every 4 (four) hours as needed.   levothyroxine (SYNTHROID) 75 MCG tablet TAKE 1 TABLET (75 MCG TOTAL) BY MOUTH DAILY.   Lutein 20 MG TABS Take 1 tablet by mouth daily.   meloxicam (MOBIC) 15 MG tablet Take 1 tablet (15 mg total) by mouth daily.   Multiple Vitamin (MULTIVITAMIN WITH MINERALS) TABS tablet Take 1 tablet by mouth daily.   nitroGLYCERIN (NITROSTAT) 0.4 MG SL tablet Place 1 tablet (0.4 mg total) under the tongue every 5 (five) minutes as needed for chest pain.   omeprazole (PRILOSEC) 20 MG capsule Take 20 mg by mouth daily.   OXYGEN 2lpm  2 lpm as needed   sacubitril-valsartan (ENTRESTO) 24-26 MG Take 1 tablet by mouth 2 (two) times daily.   Tiotropium Bromide-Olodaterol (STIOLTO RESPIMAT) 2.5-2.5 MCG/ACT AERS INHALE 2 PUFFS INTO THE LUNGS DAILY.   tiZANidine (ZANAFLEX) 4 MG tablet TAKE 1 TABLET EVERY 6 HOURS AS NEEDED FOR MUSCLE SPASM(S)   traMADol (ULTRAM) 50 MG tablet Take 1 tablet (50 mg total) by mouth every 6 (six) hours as needed for severe pain. Take one four times a day as needed for pain   UNABLE TO FIND  CPAP with o2 2lpm  DME- [facility]   VASCEPA 1 g capsule TAKE 2 CAPSULES BY MOUTH TWICE A DAY   Vitamin D, Ergocalciferol, (DRISDOL) 1.25 MG (50000 UNIT) CAPS capsule TAKE 1 CAPSULE EVERY 7 DAYS   No facility-administered encounter medications on file as of 03/08/2022.    Recent Office Vitals: BP Readings from Last 3 Encounters:  03/07/22 120/70  02/09/22 (!) 121/57  02/02/22 126/78   Pulse Readings from Last 3 Encounters:  03/07/22 94  02/09/22 64  02/02/22 71    Wt Readings from Last 3 Encounters:  03/07/22 269 lb 3.2 oz (122.1 kg)  02/02/22 272 lb (123.4 kg)  12/16/21 273 lb (123.8 kg)     Kidney Function Lab Results  Component Value Date/Time   CREATININE 0.76 12/13/2021 08:44 AM   CREATININE 0.78 09/03/2021 08:36 AM   CREATININE 0.75 12/09/2015 10:57 AM   CREATININE 0.69 10/23/2015 02:37 PM   GFR 100.87 10/01/2014 10:54 AM   GFRNONAA >60 02/27/2021 08:51 AM   GFRAA 87 04/06/2020 02:50 PM       Latest Ref Rng & Units 12/13/2021    8:44 AM 09/03/2021    8:36 AM 04/20/2021    9:09 AM  BMP  Glucose 70 - 99 mg/dL 78  132  86   BUN 8 - 27 mg/dL 14  18  17    Creatinine 0.57 - 1.00 mg/dL 0.76  0.78  0.93   BUN/Creat Ratio 12 - 28 18  23  18    Sodium 134 - 144 mmol/L 146  142  144   Potassium 3.5 - 5.2 mmol/L 4.3  4.3  4.5   Chloride 96 - 106 mmol/L 109  105  108   CO2 20 - 29 mmol/L 24  23  24    Calcium 8.7 - 10.3 mg/dL 9.2  9.6  9.1      Elray Mcgregor, [place] Pharmacist Assistant  [phone]

## 2022-03-14 ENCOUNTER — Ambulatory Visit (HOSPITAL_COMMUNITY): Payer: Medicare HMO | Attending: Cardiovascular Disease

## 2022-03-14 DIAGNOSIS — R0609 Other forms of dyspnea: Secondary | ICD-10-CM | POA: Diagnosis not present

## 2022-03-14 DIAGNOSIS — R0600 Dyspnea, unspecified: Secondary | ICD-10-CM | POA: Diagnosis not present

## 2022-03-14 LAB — ECHOCARDIOGRAM COMPLETE
Area-P 1/2: 3.99 cm2
S' Lateral: 3.9 cm

## 2022-03-14 MED ORDER — PERFLUTREN LIPID MICROSPHERE
1.0000 mL | INTRAVENOUS | Status: AC | PRN
  Administered 2022-03-14: 3 mL via INTRAVENOUS

## 2022-03-18 ENCOUNTER — Ambulatory Visit (INDEPENDENT_AMBULATORY_CARE_PROVIDER_SITE_OTHER): Payer: Medicare HMO | Admitting: Family Medicine

## 2022-03-18 VITALS — BP 126/82 | HR 92 | Temp 96.9°F | Ht 65.0 in | Wt 271.0 lb

## 2022-03-18 DIAGNOSIS — J0100 Acute maxillary sinusitis, unspecified: Secondary | ICD-10-CM | POA: Diagnosis not present

## 2022-03-18 DIAGNOSIS — J018 Other acute sinusitis: Secondary | ICD-10-CM

## 2022-03-18 DIAGNOSIS — J019 Acute sinusitis, unspecified: Secondary | ICD-10-CM | POA: Insufficient documentation

## 2022-03-18 DIAGNOSIS — R0981 Nasal congestion: Secondary | ICD-10-CM | POA: Insufficient documentation

## 2022-03-18 LAB — POC COVID19 BINAXNOW: SARS Coronavirus 2 Ag: NEGATIVE

## 2022-03-18 MED ORDER — PREDNISONE 20 MG PO TABS: ORAL_TABLET | ORAL | 0 refills | Status: DC

## 2022-03-18 MED ORDER — AZITHROMYCIN 250 MG PO TABS: ORAL_TABLET | ORAL | 1 refills | Status: AC

## 2022-03-18 NOTE — Progress Notes (Unsigned)
Acute Office Visit  Subjective:    Patient ID: Judy [name], female    DOB: April 18, 1960, 62 y.o.   MRN: [id number]  Chief Complaint  Patient presents with   URI    HPI: Patient is in today for Upper respiratory symptoms She complains of congestion, nasal congestion, no  fever, post nasal drip, shortness of breath, sinus pressure, sore throat, and wheezing.with no fever, chills, night sweats or weight loss. Onset of symptoms was  2 days ago and staying constant.She is drinking plenty of fluids.  Past history is significant for occasional episodes of bronchitis and pneumonia. Patient is former smoker.  Past Medical History:  Diagnosis Date   Anxiety    Back pain    Bilateral swelling of feet    CAD (coronary artery disease), native coronary artery    coronary CTA 01/2022 < 25% plaque in the mid RCA, LAD and OM1 with coronary Ca score of 1.23.   Chronic combined systolic and diastolic CHF (congestive heart failure) ([place]) 10/07/2014   COPD (chronic obstructive pulmonary disease) (H[facility])    DCM (dilated cardiomyopathy) ([place]) 04/25/2014   EF 28% by MRI despite maximum medical therapy   Depression    Diabetes mellitus without complication ([place])    Epilepsy ([place])    as a child   Fatty liver    Former tobacco use    Hyperlipidemia    Hypertension    Hypothyroidism    Joint pain    Leg swelling    Morbid obesity (HCC)    NICM (nonischemic cardiomyopathy) (HCC)    OSA (obstructive sleep apnea)    moderate with AHI 26/hr with oxygen desaturations as low as 70%   Scoliosis    Sleep apnea    SOB (shortness of breath)     Past Surgical History:  Procedure Laterality Date   ABDOMINAL HYSTERECTOMY     ankle artery involved cyst removal     CARDIAC CATHETERIZATION  05/14/2014   normal coronary arteries   CARPAL TUNNEL RELEASE     FASCIOTOMY     1993 for platar fasciitis   harrington rod scoliosis     1981   LEFT HEART CATHETERIZATION WITH CORONARY ANGIOGRAM N/A 05/14/2014    Procedure: LEFT HEART CATHETERIZATION WITH CORONARY ANGIOGRAM;  Surgeon: Wellington Hampshire, MD;  Location: [facility];  Service: Cardiovascular;  Laterality: N/A;   ROTATOR CUFF REPAIR     UMBILICAL HERNIA REPAIR     VESICOVAGINAL FISTULA CLOSURE W/ TAH      Family History  Problem Relation Age of Onset   Diabetes Mother    Hyperlipidemia Mother    Obesity Mother    Emphysema Father    Allergies Father    Heart failure Father    Heart disease Father 39       MI   Diabetes Father    Hyperlipidemia Father    Hypertension Father    Thyroid disease Father    Alcohol abuse Father    Obesity Father    Breast cancer Neg Hx     Social History   Socioeconomic History   Marital status: Married    Spouse name: Not on file   Number of children: Not on file   Years of education: Not on file   Highest education level: Not on file  Occupational History   Occupation: disabled  Tobacco Use   Smoking status: Former    Packs/day: 1.00    Years: 15.00    Total pack  years: 15.00    Types: Cigarettes    Quit date: 02/14/2005    Years since quitting: 17.1   Smokeless tobacco: Never  Vaping Use   Vaping Use: Never used  Substance and Sexual Activity   Alcohol use: Yes    Comment: occ   Drug use: No   Sexual activity: Not on file  Other Topics Concern   Not on file  Social History Narrative   Lives in [place] with spouse and son.   Previously worked as a Comptroller         Social Determinants of Corporate investment banker Strain: High Risk (12/21/2021)   Overall Financial Resource Strain (CARDIA)    Difficulty of Paying Living Expenses: Hard  Food Insecurity: No Food Insecurity (05/14/2020)   Hunger Vital Sign    Worried About Running Out of Food in the Last Year: Never true    Ran Out of Food in the Last Year: Never true  Transportation Needs: No Transportation Needs (12/21/2021)   PRAPARE - Administrator, Civil Service (Medical): No    Lack  of Transportation (Non-Medical): No  Physical Activity: Inactive (01/26/2021)   Exercise Vital Sign    Days of Exercise per Week: 0 days    Minutes of Exercise per Session: 0 min  Stress: Not on file  Social Connections: Not on file  Intimate Partner Violence: Not At Risk (01/26/2021)   Humiliation, Afraid, Rape, and Kick questionnaire    Fear of Current or Ex-Partner: No    Emotionally Abused: No    Physically Abused: No    Sexually Abused: No    Outpatient Medications Prior to Visit  Medication Sig Dispense Refill   albuterol (VENTOLIN HFA) 108 (90 Base) MCG/ACT inhaler Inhale 2 puffs into the lungs every 6 (six) hours as needed for wheezing or shortness of breath. 8 g 2   aspirin 81 MG tablet Take 81 mg by mouth at bedtime.      atorvastatin (LIPITOR) 20 MG tablet TAKE 1 TABLET EVERY DAY 90 tablet 3   buPROPion (WELLBUTRIN XL) 300 MG 24 hr tablet TAKE 1 TABLET EVERY DAY 90 tablet 3   carvedilol (COREG) 6.25 MG tablet TAKE 1 TABLET TWICE DAILY 180 tablet 2   clonazePAM (KLONOPIN) 0.5 MG tablet Take 1 tablet (0.5 mg total) by mouth 2 (two) times daily as needed for anxiety (panic attacks). 30 tablet 1   Coenzyme Q10 (CO Q 10) 100 MG CAPS Take 200 mg by mouth daily.     diphenhydrAMINE (BENADRYL) 25 MG tablet Take 50 mg by mouth at bedtime as needed.     Dulaglutide (TRULICITY) 3 MG/0.5ML SOPN Inject 3 mg as directed once a week. 2 mL 0   FARXIGA 10 MG TABS tablet Take 1 tablet (10 mg total) by mouth daily. 90 tablet 3   fluticasone (FLONASE) 50 MCG/ACT nasal spray SPRAY 2 SPRAYS INTO EACH NOSTRIL EVERY DAY 48 mL 2   gabapentin (NEURONTIN) 800 MG tablet TAKE 1 TABLET THREE TIMES DAILY 270 tablet 3   ipratropium-albuterol (DUONEB) 0.5-2.5 (3) MG/3ML SOLN Take 3 mLs by nebulization every 4 (four) hours as needed. 120 mL 0   levothyroxine (SYNTHROID) 75 MCG tablet TAKE 1 TABLET (75 MCG TOTAL) BY MOUTH DAILY. 90 tablet 3   Lutein 20 MG TABS Take 1 tablet by mouth daily.     meloxicam  (MOBIC) 15 MG tablet Take 1 tablet (15 mg total) by mouth daily. 90 tablet 3  Multiple Vitamin (MULTIVITAMIN WITH MINERALS) TABS tablet Take 1 tablet by mouth daily.     omeprazole (PRILOSEC) 20 MG capsule Take 20 mg by mouth daily.     OXYGEN 2lpm 2 lpm as needed     sacubitril-valsartan (ENTRESTO) 24-26 MG Take 1 tablet by mouth 2 (two) times daily. 180 tablet 3   Tiotropium Bromide-Olodaterol (STIOLTO RESPIMAT) 2.5-2.5 MCG/ACT AERS INHALE 2 PUFFS INTO THE LUNGS DAILY. 12 g 5   tiZANidine (ZANAFLEX) 4 MG tablet TAKE 1 TABLET EVERY 6 HOURS AS NEEDED FOR MUSCLE SPASM(S) 360 tablet 3   traMADol (ULTRAM) 50 MG tablet Take 1 tablet (50 mg total) by mouth every 6 (six) hours as needed for severe pain. Take one four times a day as needed for pain 360 tablet 0   UNABLE TO FIND CPAP with o2 2lpm  DME- [facility]     VASCEPA 1 g capsule TAKE 2 CAPSULES BY MOUTH TWICE A DAY 360 capsule 1   Vitamin D, Ergocalciferol, (DRISDOL) 1.25 MG (50000 UNIT) CAPS capsule TAKE 1 CAPSULE EVERY 7 DAYS 12 capsule 3   nitroGLYCERIN (NITROSTAT) 0.4 MG SL tablet Place 1 tablet (0.4 mg total) under the tongue every 5 (five) minutes as needed for chest pain. 25 tablet 0   No facility-administered medications prior to visit.    Allergies  Allergen Reactions   Demerol [Meperidine] Nausea And Vomiting    Review of Systems     Objective:        03/18/2022    9:57 AM 03/07/2022   11:13 AM 02/09/2022    8:11 AM  Vitals with BMI  Height 5\' 5"  5\' 5"    Weight 271 lbs 269 lbs 3 oz   BMI 85.2 77.8   Systolic 242 353 614  Diastolic 82 70 57  Pulse 92 94 64    No data found.   Physical Exam Vitals reviewed.  Constitutional:      Appearance: Normal appearance.  HENT:     Right Ear: Tympanic membrane, ear canal and external ear normal.     Left Ear: Tympanic membrane, ear canal and external ear normal.     Nose: Congestion present.     Comments: Sinus tenderness     Mouth/Throat:     Pharynx: Oropharynx is clear.  No oropharyngeal exudate or posterior oropharyngeal erythema.  Cardiovascular:     Rate and Rhythm: Normal rate and regular rhythm.     Heart sounds: Normal heart sounds. No murmur heard. Pulmonary:     Effort: Pulmonary effort is normal. No respiratory distress.     Breath sounds: Normal breath sounds.  Lymphadenopathy:     Cervical: No cervical adenopathy.  Neurological:     Mental Status: She is alert and oriented to person, place, and time.  Psychiatric:        Mood and Affect: Mood normal.        Behavior: Behavior normal.     Health Maintenance Due  Topic Date Due   Diabetic kidney evaluation - Urine ACR  Never done   Hepatitis C Screening  Never done   Zoster Vaccines- Shingrix (1 of 2) Never done   FOOT EXAM  07/09/2021   Medicare Annual Wellness (AWV)  01/26/2022   COVID-19 Vaccine (6 - 2023-24 season) 02/10/2022   OPHTHALMOLOGY EXAM  03/01/2022    There are no preventive care reminders to display for this patient.   Lab Results  Component Value Date   TSH 1.400 12/13/2021   Lab Results  Component Value Date   WBC 5.7 12/13/2021   HGB 13.9 12/13/2021   HCT 41.1 12/13/2021   MCV 93 12/13/2021   PLT 225 12/13/2021   Lab Results  Component Value Date   NA 146 (H) 12/13/2021   K 4.3 12/13/2021   CO2 24 12/13/2021   GLUCOSE 78 12/13/2021   BUN 14 12/13/2021   CREATININE 0.76 12/13/2021   BILITOT 0.7 12/13/2021   ALKPHOS 149 (H) 12/13/2021   AST 22 12/13/2021   ALT 43 (H) 12/13/2021   PROT 6.0 12/13/2021   ALBUMIN 4.0 12/13/2021   CALCIUM 9.2 12/13/2021   ANIONGAP 11 02/27/2021   EGFR 89 12/13/2021   GFR 100.87 10/01/2014   Lab Results  Component Value Date   CHOL 130 12/13/2021   Lab Results  Component Value Date   HDL 42 12/13/2021   Lab Results  Component Value Date   LDLCALC 65 12/13/2021   Lab Results  Component Value Date   TRIG 128 12/13/2021   Lab Results  Component Value Date   CHOLHDL 3.1 12/13/2021   Lab Results   Component Value Date   HGBA1C 6.0 (H) 12/13/2021       Assessment & Plan:  Acute maxillary sinusitis Checked Covid test which was negative. Sent Zpack and prednisone pack.    Meds ordered this encounter  Medications   azithromycin (ZITHROMAX) 250 MG tablet    Sig: Take 2 tablets on day 1, then 1 tablet daily on days 2 through 5    Dispense:  6 tablet    Refill:  1   predniSONE (DELTASONE) 20 MG tablet    Sig: Take 3 tablets (60 mg total) by mouth daily with breakfast for 3 days, THEN 2 tablets (40 mg total) daily with breakfast for 3 days, THEN 1 tablet (20 mg total) daily with breakfast for 3 days.    Dispense:  18 tablet    Refill:  0    Orders Placed This Encounter  Procedures   POC COVID-19 BinaxNow     Follow-up: Return if symptoms worsen or fail to improve.  An After Visit Summary was printed and given to the patient.  I,Oziah Vitanza,acting as a Education administrator for Rochel Brome, MD.,have documented all relevant documentation on the behalf of Rochel Brome, MD,as directed by  Rochel Brome, MD while in the presence of Rochel Brome, MD.    Rochel Brome, MD [place] [phone]

## 2022-03-18 NOTE — Assessment & Plan Note (Addendum)
Checked Covid test. Sent Zpack and prednisone pack.

## 2022-03-19 ENCOUNTER — Other Ambulatory Visit: Payer: Self-pay

## 2022-03-19 DIAGNOSIS — E782 Mixed hyperlipidemia: Secondary | ICD-10-CM

## 2022-03-19 DIAGNOSIS — E1169 Type 2 diabetes mellitus with other specified complication: Secondary | ICD-10-CM

## 2022-03-19 DIAGNOSIS — I110 Hypertensive heart disease with heart failure: Secondary | ICD-10-CM

## 2022-03-20 ENCOUNTER — Encounter: Payer: Self-pay | Admitting: Family Medicine

## 2022-03-21 ENCOUNTER — Other Ambulatory Visit: Payer: Medicare HMO

## 2022-03-21 DIAGNOSIS — I5022 Chronic systolic (congestive) heart failure: Secondary | ICD-10-CM | POA: Diagnosis not present

## 2022-03-21 DIAGNOSIS — E1169 Type 2 diabetes mellitus with other specified complication: Secondary | ICD-10-CM | POA: Diagnosis not present

## 2022-03-21 DIAGNOSIS — I110 Hypertensive heart disease with heart failure: Secondary | ICD-10-CM

## 2022-03-21 DIAGNOSIS — E782 Mixed hyperlipidemia: Secondary | ICD-10-CM | POA: Diagnosis not present

## 2022-03-21 DIAGNOSIS — E785 Hyperlipidemia, unspecified: Secondary | ICD-10-CM | POA: Diagnosis not present

## 2022-03-22 LAB — CBC WITH DIFFERENTIAL/PLATELET
Basophils Absolute: 0.1 10*3/uL (ref 0.0–0.2)
Basos: 1 %
EOS (ABSOLUTE): 0.3 10*3/uL (ref 0.0–0.4)
Eos: 4 %
Hematocrit: 43.3 % (ref 34.0–46.6)
Hemoglobin: 14.6 g/dL (ref 11.1–15.9)
Immature Grans (Abs): 0 10*3/uL (ref 0.0–0.1)
Immature Granulocytes: 0 %
Lymphocytes Absolute: 1.4 10*3/uL (ref 0.7–3.1)
Lymphs: 22 %
MCH: 31.1 pg (ref 26.6–33.0)
MCHC: 33.7 g/dL (ref 31.5–35.7)
MCV: 92 fL (ref 79–97)
Monocytes Absolute: 0.6 10*3/uL (ref 0.1–0.9)
Monocytes: 8 %
Neutrophils Absolute: 4.3 10*3/uL (ref 1.4–7.0)
Neutrophils: 65 %
Platelets: 248 10*3/uL (ref 150–450)
RBC: 4.7 x10E6/uL (ref 3.77–5.28)
RDW: 12 % (ref 11.7–15.4)
WBC: 6.6 10*3/uL (ref 3.4–10.8)

## 2022-03-22 LAB — COMPREHENSIVE METABOLIC PANEL
ALT: 31 IU/L (ref 0–32)
AST: 21 IU/L (ref 0–40)
Albumin/Globulin Ratio: 2.3 — ABNORMAL HIGH (ref 1.2–2.2)
Albumin: 4.3 g/dL (ref 3.9–4.9)
Alkaline Phosphatase: 176 IU/L — ABNORMAL HIGH (ref 44–121)
BUN/Creatinine Ratio: 21 (ref 12–28)
BUN: 15 mg/dL (ref 8–27)
Bilirubin Total: 1 mg/dL (ref 0.0–1.2)
CO2: 23 mmol/L (ref 20–29)
Calcium: 9.4 mg/dL (ref 8.7–10.3)
Chloride: 106 mmol/L (ref 96–106)
Creatinine, Ser: 0.7 mg/dL (ref 0.57–1.00)
Globulin, Total: 1.9 g/dL (ref 1.5–4.5)
Glucose: 94 mg/dL (ref 70–99)
Potassium: 4.5 mmol/L (ref 3.5–5.2)
Sodium: 145 mmol/L — ABNORMAL HIGH (ref 134–144)
Total Protein: 6.2 g/dL (ref 6.0–8.5)
eGFR: 98 mL/min/{1.73_m2} (ref >59)

## 2022-03-22 LAB — HEMOGLOBIN A1C
Est. average glucose Bld gHb Est-mCnc: 123 mg/dL
Hgb A1c MFr Bld: 5.9 % — ABNORMAL HIGH (ref 4.8–5.6)

## 2022-03-22 LAB — LIPID PANEL
Chol/HDL Ratio: 3.6 ratio (ref 0.0–4.4)
Cholesterol, Total: 135 mg/dL (ref 100–199)
HDL: 38 mg/dL — ABNORMAL LOW (ref >39)
LDL Chol Calc (NIH): 67 mg/dL (ref 0–99)
Triglycerides: 181 mg/dL — ABNORMAL HIGH (ref 0–149)
VLDL Cholesterol Cal: 30 mg/dL (ref 5–40)

## 2022-03-22 LAB — CARDIOVASCULAR RISK ASSESSMENT

## 2022-03-22 NOTE — Progress Notes (Signed)
Blood count normal.  Liver function normal.  Kidney function normal.  Cholesterol: trigs increased to 181. HDL low. Goal is > 50. HBA1C: 5.9. stable. Great.

## 2022-03-24 ENCOUNTER — Ambulatory Visit (INDEPENDENT_AMBULATORY_CARE_PROVIDER_SITE_OTHER): Payer: Medicare HMO | Admitting: Family Medicine

## 2022-03-24 ENCOUNTER — Encounter: Payer: Self-pay | Admitting: Family Medicine

## 2022-03-24 VITALS — BP 110/64 | HR 88 | Temp 97.1°F | Ht 65.0 in | Wt 270.0 lb

## 2022-03-24 DIAGNOSIS — E038 Other specified hypothyroidism: Secondary | ICD-10-CM

## 2022-03-24 DIAGNOSIS — J418 Mixed simple and mucopurulent chronic bronchitis: Secondary | ICD-10-CM

## 2022-03-24 DIAGNOSIS — E1169 Type 2 diabetes mellitus with other specified complication: Secondary | ICD-10-CM | POA: Diagnosis not present

## 2022-03-24 DIAGNOSIS — F330 Major depressive disorder, recurrent, mild: Secondary | ICD-10-CM | POA: Diagnosis not present

## 2022-03-24 DIAGNOSIS — M4030 Flatback syndrome, site unspecified: Secondary | ICD-10-CM | POA: Diagnosis not present

## 2022-03-24 DIAGNOSIS — I110 Hypertensive heart disease with heart failure: Secondary | ICD-10-CM | POA: Diagnosis not present

## 2022-03-24 DIAGNOSIS — E785 Hyperlipidemia, unspecified: Secondary | ICD-10-CM

## 2022-03-24 DIAGNOSIS — G4733 Obstructive sleep apnea (adult) (pediatric): Secondary | ICD-10-CM | POA: Diagnosis not present

## 2022-03-24 DIAGNOSIS — R0602 Shortness of breath: Secondary | ICD-10-CM

## 2022-03-24 DIAGNOSIS — I5022 Chronic systolic (congestive) heart failure: Secondary | ICD-10-CM | POA: Diagnosis not present

## 2022-03-24 DIAGNOSIS — J9611 Chronic respiratory failure with hypoxia: Secondary | ICD-10-CM

## 2022-03-24 NOTE — Progress Notes (Signed)
Subjective:  Patient ID: Judy [name], female    DOB: 26-Apr-1960  Age: 62 y.o. MRN: [id number]  Chief Complaint  Patient presents with   Depression   Diabetes   Hyperlipidemia   Hypertension    HPI Diabetes:  Complications: hyperlipidemia Glucose checking: not checking Most recent A1C: 5.9 Current medications: Farxiga 10 mg once daily, Trulicity 3 mg once daily Last Eye Exam:03/01/21 Foot checks: yes Lifestyle changes: Eating fairly healthy. Unable to exercise due to back pain.    Hyperlipidemia: Current medications: lipitor 20 mg daily, vascepa, coenzyme Q 10 daily Labs reviewed. LDL at goal. Trig 181, HDL 38, LDL 67 TC 135   Hypertension with chf due to idiopathic cardiomyopathy. Current medications: Coreg 6.25 mg twice daily, Entresto 24-26 mg twice daily, ASA 81 mg daily   Chronic pain syndrome: lumbar back pain secondary to severe scoliosis and harrington rod complication. Taking tramadol 50 mg one to two times per week and tylenol. Has muscle relaxant. Using TENS unit.   OSA/COPD: uses cpap. Compliant and benefits. On stiolto. Sees pulmonology. Continues to have shortness of breath with exertion. Worsened since  Chronic Respiratory Failure with hypoxia. Wears oxygen 2 L daily.    Hypothyroidism -currently on synthroid 75 mcg. TSH 1.4.   Depression, mild, recurrent- wellbutrin XL 300 mg once daily in a.m. clonazepam 0.5 mg 1 twice daily as needed for panic attacks.  Patient very rarely takes this.     03/24/2022    2:05 PM 03/24/2022    2:04 PM 12/16/2021    3:38 PM 09/07/2021    3:48 PM 04/23/2021   11:07 AM  Depression screen PHQ 2/9  Decreased Interest 0 0 0 0 0  Down, Depressed, Hopeless 0 0 0 0 0  PHQ - 2 Score 0 0 0 0 0  Altered sleeping 2  0    Tired, decreased energy 1  0    Change in appetite 0  0    Feeling bad or failure about yourself  0  0    Trouble concentrating 2  0    Moving slowly or fidgety/restless 0  0    Suicidal thoughts 0  0    PHQ-9  Score 5  0    Difficult doing work/chores Not difficult at all             02/27/2021    8:47 AM 04/23/2021   11:07 AM 09/07/2021    3:48 PM 12/19/2021    6:03 PM 03/24/2022    2:04 PM  [place] in the past year?  0 0 0 0  Was there an injury with Fall?  0 0 0 0  Fall Risk Category Calculator  0 0 0 0  Fall Risk Category (Retired)  Low Low Low   (RETIRED) Patient Fall Risk Level Low fall risk  Moderate fall risk Low fall risk   Patient at Risk for Falls Due to   Impaired balance/gait No Fall Risks No Fall Risks  Fall risk Follow up  Falls evaluation completed Falls evaluation completed Falls evaluation completed Falls evaluation completed      Review of Systems  Constitutional:  Negative for chills, fatigue and fever.  HENT:  Negative for congestion, ear pain, rhinorrhea and sore throat.   Respiratory:  Negative for cough and shortness of breath.   Cardiovascular:  Negative for chest pain.  Gastrointestinal:  Negative for abdominal pain, constipation, diarrhea, nausea and vomiting.  Genitourinary:  Negative for dysuria and  urgency.  Musculoskeletal:  Positive for back pain and myalgias.  Neurological:  Negative for dizziness, weakness, light-headedness and headaches.  Psychiatric/Behavioral:  Negative for dysphoric mood. The patient is not nervous/anxious.     Current Outpatient Medications on File Prior to Visit  Medication Sig Dispense Refill   albuterol (VENTOLIN HFA) 108 (90 Base) MCG/ACT inhaler Inhale 2 puffs into the lungs every 6 (six) hours as needed for wheezing or shortness of breath. 8 g 2   aspirin 81 MG tablet Take 81 mg by mouth at bedtime.      atorvastatin (LIPITOR) 20 MG tablet TAKE 1 TABLET EVERY DAY 90 tablet 3   buPROPion (WELLBUTRIN XL) 300 MG 24 hr tablet TAKE 1 TABLET EVERY DAY 90 tablet 3   carvedilol (COREG) 6.25 MG tablet TAKE 1 TABLET TWICE DAILY 180 tablet 2   clonazePAM (KLONOPIN) 0.5 MG tablet Take 1 tablet (0.5 mg total) by mouth 2 (two)  times daily as needed for anxiety (panic attacks). 30 tablet 1   Coenzyme Q10 (CO Q 10) 100 MG CAPS Take 200 mg by mouth daily.     diphenhydrAMINE (BENADRYL) 25 MG tablet Take 50 mg by mouth at bedtime as needed.     Dulaglutide (TRULICITY) 3 0000000 SOPN Inject 3 mg as directed once a week. 2 mL 0   FARXIGA 10 MG TABS tablet Take 1 tablet (10 mg total) by mouth daily. 90 tablet 3   fluticasone (FLONASE) 50 MCG/ACT nasal spray SPRAY 2 SPRAYS INTO EACH NOSTRIL EVERY DAY 48 mL 2   gabapentin (NEURONTIN) 800 MG tablet TAKE 1 TABLET THREE TIMES DAILY 270 tablet 3   ipratropium-albuterol (DUONEB) 0.5-2.5 (3) MG/3ML SOLN Take 3 mLs by nebulization every 4 (four) hours as needed. 120 mL 0   levothyroxine (SYNTHROID) 75 MCG tablet TAKE 1 TABLET (75 MCG TOTAL) BY MOUTH DAILY. 90 tablet 3   Lutein 20 MG TABS Take 1 tablet by mouth daily.     meloxicam (MOBIC) 15 MG tablet Take 1 tablet (15 mg total) by mouth daily. 90 tablet 3   Multiple Vitamin (MULTIVITAMIN WITH MINERALS) TABS tablet Take 1 tablet by mouth daily.     omeprazole (PRILOSEC) 20 MG capsule Take 20 mg by mouth daily.     OXYGEN 2lpm 2 lpm as needed     sacubitril-valsartan (ENTRESTO) 24-26 MG Take 1 tablet by mouth 2 (two) times daily. 180 tablet 3   Tiotropium Bromide-Olodaterol (STIOLTO RESPIMAT) 2.5-2.5 MCG/ACT AERS INHALE 2 PUFFS INTO THE LUNGS DAILY. 12 g 5   tiZANidine (ZANAFLEX) 4 MG tablet TAKE 1 TABLET EVERY 6 HOURS AS NEEDED FOR MUSCLE SPASM(S) 360 tablet 3   traMADol (ULTRAM) 50 MG tablet Take 1 tablet (50 mg total) by mouth every 6 (six) hours as needed for severe pain. Take one four times a day as needed for pain 360 tablet 0   UNABLE TO FIND CPAP with o2 2lpm  DME- [facility]     VASCEPA 1 g capsule TAKE 2 CAPSULES BY MOUTH TWICE A DAY 360 capsule 1   Vitamin D, Ergocalciferol, (DRISDOL) 1.25 MG (50000 UNIT) CAPS capsule TAKE 1 CAPSULE EVERY 7 DAYS 12 capsule 3   No current facility-administered medications on file prior to visit.    Past Medical History:  Diagnosis Date   Anxiety    Back pain    Bilateral swelling of feet    CAD (coronary artery disease), native coronary artery    coronary CTA 01/2022 < 25% plaque in the  mid RCA, LAD and OM1 with coronary Ca score of 1.23.   Chronic combined systolic and diastolic CHF (congestive heart failure) ([place]) 10/07/2014   COPD (chronic obstructive pulmonary disease) (HCC)    DCM (dilated cardiomyopathy) ([place]) 04/25/2014   EF 28% by MRI despite maximum medical therapy   Depression    Diabetes mellitus without complication ([place])    Epilepsy ([place])    as a child   Fatty liver    Former tobacco use    Hyperlipidemia    Hypertension    Hypothyroidism    Joint pain    Leg swelling    Morbid obesity (HCC)    NICM (nonischemic cardiomyopathy) (HCC)    OSA (obstructive sleep apnea)    moderate with AHI 26/hr with oxygen desaturations as low as 70%   Scoliosis    Sleep apnea    SOB (shortness of breath)    Past Surgical History:  Procedure Laterality Date   ABDOMINAL HYSTERECTOMY     ankle artery involved cyst removal     CARDIAC CATHETERIZATION  05/14/2014   normal coronary arteries   CARPAL TUNNEL RELEASE     FASCIOTOMY     1993 for platar fasciitis   harrington rod scoliosis     1981   LEFT HEART CATHETERIZATION WITH CORONARY ANGIOGRAM N/A 05/14/2014   Procedure: LEFT HEART CATHETERIZATION WITH CORONARY ANGIOGRAM;  Surgeon: Wellington Hampshire, MD;  Location: [facility];  Service: Cardiovascular;  Laterality: N/A;   ROTATOR CUFF REPAIR     UMBILICAL HERNIA REPAIR     VESICOVAGINAL FISTULA CLOSURE W/ TAH      Family History  Problem Relation Age of Onset   Diabetes Mother    Hyperlipidemia Mother    Obesity Mother    Emphysema Father    Allergies Father    Heart failure Father    Heart disease Father 40       MI   Diabetes Father    Hyperlipidemia Father    Hypertension Father    Thyroid disease Father    Alcohol abuse Father    Obesity Father     Breast cancer Neg Hx    Social History   Socioeconomic History   Marital status: Married    Spouse name: Not on file   Number of children: Not on file   Years of education: Not on file   Highest education level: Not on file  Occupational History   Occupation: disabled  Tobacco Use   Smoking status: Former    Packs/day: 1.00    Years: 15.00    Total pack years: 15.00    Types: Cigarettes    Quit date: 02/14/2005    Years since quitting: 17.1   Smokeless tobacco: Never  Vaping Use   Vaping Use: Never used  Substance and Sexual Activity   Alcohol use: Yes    Comment: occ   Drug use: No   Sexual activity: Not on file  Other Topics Concern   Not on file  Social History Narrative   Lives in [place] with spouse and son.   Previously worked as a Engineer, manufacturing         Social Determinants of Radio broadcast assistant Strain: High Risk (12/21/2021)   Overall Financial Resource Strain (CARDIA)    Difficulty of Paying Living Expenses: Hard  Food Insecurity: No Food Insecurity (05/14/2020)   Hunger Vital Sign    Worried About Running Out of Food in the Last Year: Never  true    Ran Out of Food in the Last Year: Never true  Transportation Needs: No Transportation Needs (12/21/2021)   PRAPARE - Hydrologist (Medical): No    Lack of Transportation (Non-Medical): No  Physical Activity: Inactive (01/26/2021)   Exercise Vital Sign    Days of Exercise per Week: 0 days    Minutes of Exercise per Session: 0 min  Stress: No Stress Concern Present (03/24/2022)   Ball    Feeling of Stress : Not at all  Social Connections: Moderately Isolated (03/24/2022)   Social Connection and Isolation Panel [NHANES]    Frequency of Communication with Friends and Family: More than three times a week    Frequency of Social Gatherings with Friends and Family: More than three times a week     Attends Religious Services: Never    Marine scientist or Organizations: No    Attends Music therapist: Never    Marital Status: Married    Objective:  BP 110/64   Pulse 88   Temp (!) 97.1 F (36.2 C)   Ht 5' 5"$  (1.651 m)   Wt 270 lb (122.5 kg)   SpO2 91%   BMI 44.93 kg/m      03/24/2022    2:02 PM 03/18/2022    9:57 AM 03/07/2022   11:13 AM  BP/Weight  Systolic BP [id number]  Diastolic BP 64 82 70  Wt. (Lbs) 270 271 269.2  BMI 44.93 kg/m2 45.1 kg/m2 44.8 kg/m2    Physical Exam Vitals reviewed.  Constitutional:      Appearance: Normal appearance. She is obese.  Neck:     Vascular: No carotid bruit.  Cardiovascular:     Rate and Rhythm: Normal rate and regular rhythm.     Heart sounds: Normal heart sounds.  Pulmonary:     Effort: Pulmonary effort is normal. No respiratory distress.     Breath sounds: Normal breath sounds.  Abdominal:     General: Abdomen is flat. Bowel sounds are normal.     Palpations: Abdomen is soft.     Tenderness: There is no abdominal tenderness.  Musculoskeletal:        General: Deformity (flat back syndrome) present.  Neurological:     Mental Status: She is alert and oriented to person, place, and time.  Psychiatric:        Mood and Affect: Mood normal.        Behavior: Behavior normal.     Diabetic Foot Exam - Simple   Simple Foot Form Diabetic Foot exam was performed with the following findings: Yes 03/24/2022  2:42 PM  Visual Inspection No deformities, no ulcerations, no other skin breakdown bilaterally: Yes Sensation Testing Intact to touch and monofilament testing bilaterally: Yes Pulse Check Posterior Tibialis and Dorsalis pulse intact bilaterally: Yes Comments      Lab Results  Component Value Date   WBC 6.6 03/21/2022   HGB 14.6 03/21/2022   HCT 43.3 03/21/2022   PLT 248 03/21/2022   GLUCOSE 94 03/21/2022   CHOL 135 03/21/2022   TRIG 181 (H) 03/21/2022   HDL 38 (L) 03/21/2022   LDLCALC 67  03/21/2022   ALT 31 03/21/2022   AST 21 03/21/2022   NA 145 (H) 03/21/2022   K 4.5 03/21/2022   CL 106 03/21/2022   CREATININE 0.70 03/21/2022   BUN 15 03/21/2022   CO2 23 03/21/2022  TSH 1.400 12/13/2021   INR 1.0 05/13/2014   HGBA1C 5.9 (H) 03/21/2022   MICROALBUR Negative 09/03/2019      Assessment & Plan:    Hypertensive heart disease with chronic systolic congestive heart failure ([place]) Assessment & Plan: Well controlled.  No changes to medicines. Continue farxiga 10 mg daily, Coreg 6.25 mg twice daily, Entresto 24-26 mg twice daily, ASA 81 mg daily Continue to work on eating a healthy diet and exercise.  Labs reviewed.    Hyperlipidemia associated with type 2 diabetes mellitus ([place]) Assessment & Plan: Diabetes and cholesterol at goal Recommend check feet daily. Recommend annual eye exams. Medicines: Continue Farxiga 10 mg once daily, Trulicity 3 mg once daily, atorvastatin 20 mg before bed, vascepa 1 gm 2 capsules twice daily, and coenzyme q10.  Continue to work on eating a healthy diet and exercise.  Labs reviewed   Shortness of breath Assessment & Plan: Multifactorial: COPD, CHF   Chronic respiratory failure with hypoxia (HCC) Assessment & Plan: Continue oxygen 2 L at night and with CPAP.  Oxygen as needed during the day.    OSA (obstructive sleep apnea) Assessment & Plan: Continue cpap and 2 L oxygen.    Other specified hypothyroidism Assessment & Plan: Previously well controlled Continue Synthroid at current dose  Recheck TSH and adjust Synthroid as indicated     Mixed simple and mucopurulent chronic bronchitis ([place]) Assessment & Plan: Stable.  Continue stiolto.   Flatback syndrome Assessment & Plan: Due to hardware.  Continue tylenol and tramadol.   Mild recurrent major depression ([place]) Assessment & Plan: The current medical regimen is effective;  continue present plan and medications. Continue wellbutrin XL 300 mg once daily in  a.m. clonazepam 0.5 mg 1 twice daily as needed for panic attacks.       Follow-up: Return in about 3 months (around 06/22/2022) for chronic fasting.  An After Visit Summary was printed and given to the patient.  Rochel Brome, MD [facility] [phone]

## 2022-03-26 NOTE — Assessment & Plan Note (Signed)
Continue cpap and 2 L oxygen.

## 2022-03-26 NOTE — Assessment & Plan Note (Signed)
Diabetes and cholesterol at goal Recommend check feet daily. Recommend annual eye exams. Medicines: Continue Farxiga 10 mg once daily, Trulicity 3 mg once daily, atorvastatin 20 mg before bed, vascepa 1 gm 2 capsules twice daily, and coenzyme q10.  Continue to work on eating a healthy diet and exercise.  Labs reviewed

## 2022-03-26 NOTE — Assessment & Plan Note (Signed)
Continue oxygen 2 L at night and with CPAP.  Oxygen as needed during the day.

## 2022-03-26 NOTE — Assessment & Plan Note (Signed)
Previously well controlled Continue Synthroid at current dose  Recheck TSH and adjust Synthroid as indicated   

## 2022-03-26 NOTE — Assessment & Plan Note (Signed)
Multifactorial: COPD, CHF

## 2022-03-26 NOTE — Assessment & Plan Note (Signed)
The current medical regimen is effective;  continue present plan and medications. Continue wellbutrin XL 300 mg once daily in a.m. clonazepam 0.5 mg 1 twice daily as needed for panic attacks.

## 2022-03-26 NOTE — Assessment & Plan Note (Signed)
Stable.  Continue stiolto.

## 2022-03-26 NOTE — Assessment & Plan Note (Signed)
Due to hardware.  Continue tylenol and tramadol.

## 2022-03-26 NOTE — Assessment & Plan Note (Signed)
Well controlled.  No changes to medicines. Continue farxiga 10 mg daily, Coreg 6.25 mg twice daily, Entresto 24-26 mg twice daily, ASA 81 mg daily Continue to work on eating a healthy diet and exercise.  Labs reviewed.

## 2022-03-31 ENCOUNTER — Other Ambulatory Visit: Payer: Self-pay

## 2022-03-31 DIAGNOSIS — F410 Panic disorder [episodic paroxysmal anxiety] without agoraphobia: Secondary | ICD-10-CM

## 2022-04-01 MED ORDER — CLONAZEPAM 0.5 MG PO TABS: 0.5000 mg | ORAL_TABLET | Freq: Two times a day (BID) | ORAL | 1 refills | Status: DC | PRN

## 2022-04-05 ENCOUNTER — Other Ambulatory Visit: Payer: Self-pay

## 2022-04-05 ENCOUNTER — Encounter: Payer: Self-pay | Admitting: Family Medicine

## 2022-04-05 ENCOUNTER — Ambulatory Visit (INDEPENDENT_AMBULATORY_CARE_PROVIDER_SITE_OTHER): Payer: Medicare HMO | Admitting: Family Medicine

## 2022-04-05 VITALS — BP 128/78 | HR 76 | Temp 97.7°F | Ht 65.0 in | Wt 272.8 lb

## 2022-04-05 DIAGNOSIS — F410 Panic disorder [episodic paroxysmal anxiety] without agoraphobia: Secondary | ICD-10-CM

## 2022-04-05 DIAGNOSIS — J018 Other acute sinusitis: Secondary | ICD-10-CM | POA: Diagnosis not present

## 2022-04-05 DIAGNOSIS — H6690 Otitis media, unspecified, unspecified ear: Secondary | ICD-10-CM | POA: Insufficient documentation

## 2022-04-05 DIAGNOSIS — H65193 Other acute nonsuppurative otitis media, bilateral: Secondary | ICD-10-CM | POA: Diagnosis not present

## 2022-04-05 MED ORDER — TRIAMCINOLONE ACETONIDE 40 MG/ML IJ SUSP
80.0000 mg | Freq: Once | INTRAMUSCULAR | Status: AC
  Administered 2022-04-05: 80 mg via INTRAMUSCULAR

## 2022-04-05 MED ORDER — CEFTRIAXONE SODIUM 1 G IJ SOLR
1.0000 g | Freq: Once | INTRAMUSCULAR | Status: AC
  Administered 2022-04-05: 1 g via INTRAMUSCULAR

## 2022-04-05 MED ORDER — AMOXICILLIN-POT CLAVULANATE 875-125 MG PO TABS: 1.0000 | ORAL_TABLET | Freq: Two times a day (BID) | ORAL | 0 refills | Status: DC

## 2022-04-05 NOTE — Assessment & Plan Note (Signed)
Augmentin prescription sent.

## 2022-04-05 NOTE — Assessment & Plan Note (Addendum)
Augmentin prescription sent.  Rocephin and kenalog shots given.

## 2022-04-05 NOTE — Progress Notes (Signed)
Acute Office Visit  Subjective:    Patient ID: Judy [name], female    DOB: 03/04/1960, 62 y.o.   MRN: [id number]  Chief Complaint  Patient presents with   Sinusitis    HPI: Patient is in today for Sinusitis symptoms She complains of congestion, facial pain, headache described as sinus, nasal congestion, productive cough with  green colored sputum, sinus pressure, sore throat, and wheezing.with chills. Onset of symptoms was a few days ago and worsening.She is drinking moderate amounts of fluids.  Past history is significant for  Recurrent sinusitis . Patient is former smoker, quit 16 years ago   Past Medical History:  Diagnosis Date   Anxiety    Back pain    Bilateral swelling of feet    CAD (coronary artery disease), native coronary artery    coronary CTA 01/2022 < 25% plaque in the mid RCA, LAD and OM1 with coronary Ca score of 1.23.   Chronic combined systolic and diastolic CHF (congestive heart failure) ([place]) 10/07/2014   COPD (chronic obstructive pulmonary disease) (HCC)    DCM (dilated cardiomyopathy) ([place]) 04/25/2014   EF 28% by MRI despite maximum medical therapy   Depression    Diabetes mellitus without complication ([place])    Epilepsy ([place])    as a child   Fatty liver    Former tobacco use    Hyperlipidemia    Hypertension    Hypothyroidism    Joint pain    Leg swelling    Morbid obesity (HCC)    NICM (nonischemic cardiomyopathy) (HCC)    OSA (obstructive sleep apnea)    moderate with AHI 26/hr with oxygen desaturations as low as 70%   Scoliosis    Sleep apnea    SOB (shortness of breath)     Past Surgical History:  Procedure Laterality Date   ABDOMINAL HYSTERECTOMY     ankle artery involved cyst removal     CARDIAC CATHETERIZATION  05/14/2014   normal coronary arteries   CARPAL TUNNEL RELEASE     FASCIOTOMY     1993 for platar fasciitis   harrington rod scoliosis     1981   LEFT HEART CATHETERIZATION WITH CORONARY ANGIOGRAM N/A 05/14/2014    Procedure: LEFT HEART CATHETERIZATION WITH CORONARY ANGIOGRAM;  Surgeon: Wellington Hampshire, MD;  Location: [facility];  Service: Cardiovascular;  Laterality: N/A;   ROTATOR CUFF REPAIR     UMBILICAL HERNIA REPAIR     VESICOVAGINAL FISTULA CLOSURE W/ TAH      Family History  Problem Relation Age of Onset   Diabetes Mother    Hyperlipidemia Mother    Obesity Mother    Emphysema Father    Allergies Father    Heart failure Father    Heart disease Father 60       MI   Diabetes Father    Hyperlipidemia Father    Hypertension Father    Thyroid disease Father    Alcohol abuse Father    Obesity Father    Breast cancer Neg Hx     Social History   Socioeconomic History   Marital status: Married    Spouse name: Not on file   Number of children: Not on file   Years of education: Not on file   Highest education level: Not on file  Occupational History   Occupation: disabled  Tobacco Use   Smoking status: Former    Packs/day: 1.00    Years: 15.00    Total pack years:  15.00    Types: Cigarettes    Quit date: 02/14/2005    Years since quitting: 17.1   Smokeless tobacco: Never  Vaping Use   Vaping Use: Never used  Substance and Sexual Activity   Alcohol use: Yes    Comment: occ   Drug use: No   Sexual activity: Not on file  Other Topics Concern   Not on file  Social History Narrative   Lives in [place] with spouse and son.   Previously worked as a Engineer, manufacturing         Social Determinants of Radio broadcast assistant Strain: High Risk (12/21/2021)   Overall Financial Resource Strain (CARDIA)    Difficulty of Paying Living Expenses: Hard  Food Insecurity: No Food Insecurity (05/14/2020)   Hunger Vital Sign    Worried About Running Out of Food in the Last Year: Never true    [place] in the Last Year: Never true  Transportation Needs: No Transportation Needs (12/21/2021)   PRAPARE - Hydrologist (Medical): No    Lack  of Transportation (Non-Medical): No  Physical Activity: Inactive (01/26/2021)   Exercise Vital Sign    Days of Exercise per Week: 0 days    Minutes of Exercise per Session: 0 min  Stress: No Stress Concern Present (03/24/2022)   [place]    Feeling of Stress : Not at all  Social Connections: Moderately Isolated (03/24/2022)   Social Connection and Isolation Panel [NHANES]    Frequency of Communication with Friends and Family: More than three times a week    Frequency of Social Gatherings with Friends and Family: More than three times a week    Attends Religious Services: Never    Marine scientist or Organizations: No    Attends Archivist Meetings: Never    Marital Status: Married  Human resources officer Violence: Not At Risk (01/26/2021)   Humiliation, Afraid, Rape, and Kick questionnaire    Fear of Current or Ex-Partner: No    Emotionally Abused: No    Physically Abused: No    Sexually Abused: No    Outpatient Medications Prior to Visit  Medication Sig Dispense Refill   albuterol (VENTOLIN HFA) 108 (90 Base) MCG/ACT inhaler Inhale 2 puffs into the lungs every 6 (six) hours as needed for wheezing or shortness of breath. 8 g 2   aspirin 81 MG tablet Take 81 mg by mouth at bedtime.      atorvastatin (LIPITOR) 20 MG tablet TAKE 1 TABLET EVERY DAY 90 tablet 3   buPROPion (WELLBUTRIN XL) 300 MG 24 hr tablet TAKE 1 TABLET EVERY DAY 90 tablet 3   carvedilol (COREG) 6.25 MG tablet TAKE 1 TABLET TWICE DAILY 180 tablet 2   Coenzyme Q10 (CO Q 10) 100 MG CAPS Take 200 mg by mouth daily.     diphenhydrAMINE (BENADRYL) 25 MG tablet Take 50 mg by mouth at bedtime as needed.     Dulaglutide (TRULICITY) 3 0000000 SOPN Inject 3 mg as directed once a week. 2 mL 0   FARXIGA 10 MG TABS tablet Take 1 tablet (10 mg total) by mouth daily. 90 tablet 3   fluticasone (FLONASE) 50 MCG/ACT nasal spray SPRAY 2 SPRAYS INTO EACH NOSTRIL  EVERY DAY 48 mL 2   gabapentin (NEURONTIN) 800 MG tablet TAKE 1 TABLET THREE TIMES DAILY 270 tablet 3   ipratropium-albuterol (DUONEB) 0.5-2.5 (3) MG/3ML  SOLN Take 3 mLs by nebulization every 4 (four) hours as needed. 120 mL 0   levothyroxine (SYNTHROID) 75 MCG tablet TAKE 1 TABLET (75 MCG TOTAL) BY MOUTH DAILY. 90 tablet 3   Lutein 20 MG TABS Take 1 tablet by mouth daily.     meloxicam (MOBIC) 15 MG tablet Take 1 tablet (15 mg total) by mouth daily. 90 tablet 3   Multiple Vitamin (MULTIVITAMIN WITH MINERALS) TABS tablet Take 1 tablet by mouth daily.     omeprazole (PRILOSEC) 20 MG capsule Take 20 mg by mouth daily.     OXYGEN 2lpm 2 lpm as needed     sacubitril-valsartan (ENTRESTO) 24-26 MG Take 1 tablet by mouth 2 (two) times daily. 180 tablet 3   Tiotropium Bromide-Olodaterol (STIOLTO RESPIMAT) 2.5-2.5 MCG/ACT AERS INHALE 2 PUFFS INTO THE LUNGS DAILY. 12 g 5   tiZANidine (ZANAFLEX) 4 MG tablet TAKE 1 TABLET EVERY 6 HOURS AS NEEDED FOR MUSCLE SPASM(S) 360 tablet 3   traMADol (ULTRAM) 50 MG tablet Take 1 tablet (50 mg total) by mouth every 6 (six) hours as needed for severe pain. Take one four times a day as needed for pain 360 tablet 0   UNABLE TO FIND CPAP with o2 2lpm  DME- [facility]     VASCEPA 1 g capsule TAKE 2 CAPSULES BY MOUTH TWICE A DAY 360 capsule 1   Vitamin D, Ergocalciferol, (DRISDOL) 1.25 MG (50000 UNIT) CAPS capsule TAKE 1 CAPSULE EVERY 7 DAYS 12 capsule 3   clonazePAM (KLONOPIN) 0.5 MG tablet Take 1 tablet (0.5 mg total) by mouth 2 (two) times daily as needed for anxiety (panic attacks). 30 tablet 1   No facility-administered medications prior to visit.    Allergies  Allergen Reactions   Demerol [Meperidine] Nausea And Vomiting    Review of Systems  Constitutional:  Positive for chills. Negative for appetite change, fatigue and fever.  HENT:  Positive for congestion, ear pain (bilateral), sinus pressure, sinus pain and sore throat.   Respiratory:  Positive for cough, chest  tightness and wheezing. Negative for shortness of breath.   Cardiovascular:  Negative for chest pain and palpitations.  Gastrointestinal:  Negative for abdominal pain, constipation, diarrhea, nausea and vomiting.  Genitourinary:  Negative for dysuria and hematuria.  Musculoskeletal:  Negative for arthralgias, back pain, joint swelling and myalgias.  Skin:  Negative for rash.  Neurological:  Positive for headaches (sinus pressure). Negative for dizziness and weakness.  Psychiatric/Behavioral:  Negative for dysphoric mood. The patient is not nervous/anxious.        Objective:        04/05/2022    3:07 PM 03/24/2022    2:02 PM 03/18/2022    9:57 AM  Vitals with BMI  Height '5\' 5"'$  '5\' 5"'$  '5\' 5"'$   Weight 272 lbs 13 oz 270 lbs 271 lbs  BMI 45.4 [id number]  Systolic 0000000 [id number]  Diastolic 78 64 82  Pulse 76 88 92    No data found.    Physical Exam Vitals reviewed.  Constitutional:      Appearance: Normal appearance. She is normal weight.  HENT:     Right Ear: Ear canal and external ear normal.     Left Ear: Ear canal and external ear normal.     Ears:     Comments: BL TM erythema (left > right)    Nose: Congestion present.     Mouth/Throat:     Pharynx: Oropharynx is clear. No oropharyngeal exudate or posterior oropharyngeal  erythema.  Cardiovascular:     Rate and Rhythm: Normal rate and regular rhythm.     Heart sounds: Normal heart sounds. No murmur heard. Pulmonary:     Effort: Pulmonary effort is normal. No respiratory distress.     Breath sounds: Normal breath sounds.  Neurological:     Mental Status: She is alert and oriented to person, place, and time.  Psychiatric:        Mood and Affect: Mood normal.        Behavior: Behavior normal.     Health Maintenance Due  Topic Date Due   Diabetic kidney evaluation - Urine ACR  Never done   Hepatitis C Screening  Never done   Zoster Vaccines- Shingrix (1 of 2) Never done   Medicare Annual Wellness (AWV)  01/26/2022    COVID-19 Vaccine (6 - 2023-24 season) 02/10/2022   OPHTHALMOLOGY EXAM  03/01/2022    There are no preventive care reminders to display for this patient.   Lab Results  Component Value Date   TSH 1.400 12/13/2021   Lab Results  Component Value Date   WBC 6.6 03/21/2022   HGB 14.6 03/21/2022   HCT 43.3 03/21/2022   MCV 92 03/21/2022   PLT 248 03/21/2022   Lab Results  Component Value Date   NA 145 (H) 03/21/2022   K 4.5 03/21/2022   CO2 23 03/21/2022   GLUCOSE 94 03/21/2022   BUN 15 03/21/2022   CREATININE 0.70 03/21/2022   BILITOT 1.0 03/21/2022   ALKPHOS 176 (H) 03/21/2022   AST 21 03/21/2022   ALT 31 03/21/2022   PROT 6.2 03/21/2022   ALBUMIN 4.3 03/21/2022   CALCIUM 9.4 03/21/2022   ANIONGAP 11 02/27/2021   EGFR 98 03/21/2022   GFR 100.87 10/01/2014   Lab Results  Component Value Date   CHOL 135 03/21/2022   Lab Results  Component Value Date   HDL 38 (L) 03/21/2022   Lab Results  Component Value Date   LDLCALC 67 03/21/2022   Lab Results  Component Value Date   TRIG 181 (H) 03/21/2022   Lab Results  Component Value Date   CHOLHDL 3.6 03/21/2022   Lab Results  Component Value Date   HGBA1C 5.9 (H) 03/21/2022       Assessment & Plan:  Acute infection of nasal sinus Augmentin prescription sent.  Rocephin and kenalog shots given.   Otitis media Augmentin prescription sent.     Meds ordered this encounter  Medications   amoxicillin-clavulanate (AUGMENTIN) 875-125 MG tablet    Sig: Take 1 tablet by mouth 2 (two) times daily.    Dispense:  20 tablet    Refill:  0   cefTRIAXone (ROCEPHIN) injection 1 g   triamcinolone acetonide (KENALOG-40) injection 80 mg    No orders of the defined types were placed in this encounter.    Follow-up: No follow-ups on file.  An After Visit Summary was printed and given to the patient.  Rochel Brome, MD [facility] [phone]

## 2022-04-07 MED ORDER — CLONAZEPAM 0.5 MG PO TABS: 0.5000 mg | ORAL_TABLET | Freq: Two times a day (BID) | ORAL | 0 refills | Status: AC | PRN

## 2022-04-11 ENCOUNTER — Other Ambulatory Visit: Payer: Self-pay

## 2022-04-12 DIAGNOSIS — M25511 Pain in right shoulder: Secondary | ICD-10-CM | POA: Diagnosis not present

## 2022-04-14 ENCOUNTER — Other Ambulatory Visit: Payer: Self-pay | Admitting: Family Medicine

## 2022-04-14 DIAGNOSIS — Z1231 Encounter for screening mammogram for malignant neoplasm of breast: Secondary | ICD-10-CM

## 2022-04-15 ENCOUNTER — Ambulatory Visit (INDEPENDENT_AMBULATORY_CARE_PROVIDER_SITE_OTHER): Payer: Medicare HMO

## 2022-04-15 VITALS — BP 124/74 | HR 67 | Ht 65.0 in | Wt 272.0 lb

## 2022-04-15 DIAGNOSIS — Z780 Asymptomatic menopausal state: Secondary | ICD-10-CM

## 2022-04-15 DIAGNOSIS — Z0000 Encounter for general adult medical examination without abnormal findings: Secondary | ICD-10-CM | POA: Diagnosis not present

## 2022-04-15 NOTE — Patient Instructions (Signed)

## 2022-04-15 NOTE — Progress Notes (Addendum)
Subjective:   Judy [name] is a 62 y.o. female who presents for Medicare Annual (Subsequent) preventive examination.  I connected with  Marchia Meiers on 04/15/22 by phone enabled telemedicine application and verified that I am speaking with the correct person using two identifiers.   I discussed the limitations of evaluation and management by telemedicine. The patient expressed understanding and agreed to proceed.   Cardiac Risk Factors include: advanced age (>41mn, >>6women);diabetes mellitus;hypertension     Objective:    Today's Vitals   04/15/22 1005 04/15/22 1017  Weight: 272 lb (123.4 kg)   Height: '5\' 5"'$  (1.651 m)   PainSc:  4    Body mass index is 45.26 kg/m.     04/15/2022   10:09 AM 02/27/2021   11:26 AM 01/26/2021    2:45 PM 09/24/2018    5:08 PM 01/29/2015    8:33 PM 11/17/2014    8:54 PM 05/14/2014   11:42 AM  Advanced Directives  Does Patient Have a Medical Advance Directive? Yes No Yes No No No No  Type of [place]amed[place]of [place] will  [place] will      Does patient want to make changes to medical advance directive?   No - Patient declined      Copy of [place] Chart? No - copy requested  No - copy requested      Would patient like information on creating a medical advance directive?  No - Patient declined  No - Patient declined No - patient declined information Yes - Educational materials given No - patient declined information    Current Medications (verified) Outpatient Encounter Medications as of 04/15/2022  Medication Sig   albuterol (VENTOLIN HFA) 108 (90 Base) MCG/ACT inhaler Inhale 2 puffs into the lungs every 6 (six) hours as needed for wheezing or shortness of breath.   aspirin 81 MG tablet Take 81 mg by mouth at bedtime.    atorvastatin (LIPITOR) 20 MG tablet TAKE 1 TABLET EVERY DAY   buPROPion (WELLBUTRIN XL) 300 MG 24 hr tablet TAKE 1 TABLET EVERY DAY    carvedilol (COREG) 6.25 MG tablet TAKE 1 TABLET TWICE DAILY   clonazePAM (KLONOPIN) 0.5 MG tablet Take 1 tablet (0.5 mg total) by mouth 2 (two) times daily as needed for anxiety (panic attacks).   Coenzyme Q10 (CO Q 10) 100 MG CAPS Take 200 mg by mouth daily.   diphenhydrAMINE (BENADRYL) 25 MG tablet Take 50 mg by mouth at bedtime as needed.   Dulaglutide (TRULICITY) 3 M0000000SOPN Inject 3 mg as directed once a week.   FARXIGA 10 MG TABS tablet Take 1 tablet (10 mg total) by mouth daily.   fluticasone (FLONASE) 50 MCG/ACT nasal spray SPRAY 2 SPRAYS INTO EACH NOSTRIL EVERY DAY   gabapentin (NEURONTIN) 800 MG tablet TAKE 1 TABLET THREE TIMES DAILY   ipratropium-albuterol (DUONEB) 0.5-2.5 (3) MG/3ML SOLN Take 3 mLs by nebulization every 4 (four) hours as needed.   levothyroxine (SYNTHROID) 75 MCG tablet TAKE 1 TABLET (75 MCG TOTAL) BY MOUTH DAILY.   Lutein 20 MG TABS Take 1 tablet by mouth daily.   meloxicam (MOBIC) 15 MG tablet Take 1 tablet (15 mg total) by mouth daily.   Multiple Vitamin (MULTIVITAMIN WITH MINERALS) TABS tablet Take 1 tablet by mouth daily.   omeprazole (PRILOSEC) 20 MG capsule Take 20 mg by mouth daily.   OXYGEN 2lpm 2 lpm as needed   sacubitril-valsartan (ENTRESTO)  24-26 MG Take 1 tablet by mouth 2 (two) times daily.   Tiotropium Bromide-Olodaterol (STIOLTO RESPIMAT) 2.5-2.5 MCG/ACT AERS INHALE 2 PUFFS INTO THE LUNGS DAILY.   tiZANidine (ZANAFLEX) 4 MG tablet TAKE 1 TABLET EVERY 6 HOURS AS NEEDED FOR MUSCLE SPASM(S)   traMADol (ULTRAM) 50 MG tablet Take 1 tablet (50 mg total) by mouth every 6 (six) hours as needed for severe pain. Take one four times a day as needed for pain   UNABLE TO FIND CPAP with o2 2lpm  DME- [facility]   VASCEPA 1 g capsule TAKE 2 CAPSULES BY MOUTH TWICE A DAY   Vitamin D, Ergocalciferol, (DRISDOL) 1.25 MG (50000 UNIT) CAPS capsule TAKE 1 CAPSULE EVERY 7 DAYS   [DISCONTINUED] amoxicillin-clavulanate (AUGMENTIN) 875-125 MG tablet Take 1 tablet by mouth 2  (two) times daily.   No facility-administered encounter medications on file as of 04/15/2022.    Allergies (verified) Meperidine   History: Past Medical History:  Diagnosis Date   Anxiety    Back pain    Bilateral swelling of feet    CAD (coronary artery disease), native coronary artery    coronary CTA 01/2022 < 25% plaque in the mid RCA, LAD and OM1 with coronary Ca score of 1.23.   Chronic combined systolic and diastolic CHF (congestive heart failure) ([place]) 10/07/2014   COPD (chronic obstructive pulmonary disease) (HCC)    DCM (dilated cardiomyopathy) ([place]) 04/25/2014   EF 28% by MRI despite maximum medical therapy   Depression    Diabetes mellitus without complication ([place])    Epilepsy ([place])    as a child   Fatty liver    Former tobacco use    Hyperlipidemia    Hypertension    Hypothyroidism    Joint pain    Leg swelling    Morbid obesity (HCC)    NICM (nonischemic cardiomyopathy) (HCC)    OSA (obstructive sleep apnea)    moderate with AHI 26/hr with oxygen desaturations as low as 70%   Scoliosis    Sleep apnea    SOB (shortness of breath)    Past Surgical History:  Procedure Laterality Date   ABDOMINAL HYSTERECTOMY     ankle artery involved cyst removal     CARDIAC CATHETERIZATION  05/14/2014   normal coronary arteries   CARPAL TUNNEL RELEASE     FASCIOTOMY     1993 for platar fasciitis   harrington rod scoliosis     1981   LEFT HEART CATHETERIZATION WITH CORONARY ANGIOGRAM N/A 05/14/2014   Procedure: LEFT HEART CATHETERIZATION WITH CORONARY ANGIOGRAM;  Surgeon: Wellington Hampshire, MD;  Location: [facility];  Service: Cardiovascular;  Laterality: N/A;   ROTATOR CUFF REPAIR Right [id number]   UMBILICAL HERNIA REPAIR     VESICOVAGINAL FISTULA CLOSURE W/ TAH     Family History  Problem Relation Age of Onset   Diabetes Mother    Hyperlipidemia Mother    Obesity Mother    Emphysema Father    Allergies Father    Heart failure Father    Heart disease Father 51        MI   Diabetes Father    Hyperlipidemia Father    Hypertension Father    Thyroid disease Father    Alcohol abuse Father    Obesity Father    Breast cancer Neg Hx    Social History   Socioeconomic History   Marital status: Married    Spouse name: Not on file   Number of children: Not  on file   Years of education: Not on file   Highest education level: Not on file  Occupational History   Occupation: disabled  Tobacco Use   Smoking status: Former    Packs/day: 1.00    Years: 20.00    Total pack years: 20.00    Types: Cigarettes    Quit date: 02/14/2005    Years since quitting: 17.1   Smokeless tobacco: Never  Vaping Use   Vaping Use: Never used  Substance and Sexual Activity   Alcohol use: Yes    Alcohol/week: 2.0 standard drinks of alcohol    Types: 2 Shots of liquor per week    Comment: socially   Drug use: No   Sexual activity: Not Currently  Other Topics Concern   Not on file  Social History Narrative   Lives in [place] with spouse and son.   Previously worked as a Engineer, manufacturing         Social Determinants of Radio broadcast assistant Strain: Rebecca (04/15/2022)   Overall Financial Resource Strain ([place])    Difficulty of Paying Living Expenses: Somewhat hard  Food Insecurity: No Food Insecurity (04/15/2022)   Hunger Vital Sign    Worried About Running Out of Food in the Last Year: Never true    [place] in the Last Year: Never true  Transportation Needs: No Transportation Needs (04/15/2022)   PRAPARE - Hydrologist (Medical): No    Lack of Transportation (Non-Medical): No  Physical Activity: Inactive (04/15/2022)   Exercise Vital Sign    Days of Exercise per Week: 0 days    Minutes of Exercise per Session: 0 min  Stress: No Stress Concern Present (04/15/2022)   [place]    Feeling of Stress : Not at all  Social Connections:  Moderately Isolated (04/15/2022)   Social Connection and Isolation Panel [NHANES]    Frequency of Communication with Friends and Family: More than three times a week    Frequency of Social Gatherings with Friends and Family: More than three times a week    Attends Religious Services: Never    Marine scientist or Organizations: No    Attends Music therapist: Never    Marital Status: Married    Tobacco Counseling Counseling given: Not Answered   Clinical Intake:  Pre-visit preparation completed: Yes  Pain : 0-10 Pain Score: 4  Pain Type: Chronic pain Pain Location: Back Pain Orientation: Lower, Right Pain Radiating Towards: right leg to knee Pain Descriptors / Indicators: Burning Pain Onset: More than a month ago Pain Frequency: Constant Pain Relieving Factors: Pain medication Effect of Pain on Daily Activities: yes  Pain Relieving Factors: Pain medication  BMI - recorded: 45.26 Nutritional Status: BMI > 30  Obese Nutritional Risks: None Diabetes: Yes CBG done?: No Did pt. bring in CBG monitor from home?: No  How often do you need to have someone help you when you read instructions, pamphlets, or other written materials from your doctor or pharmacy?: 1 - Never What is the last grade level you completed in school?: Some college  Diabetic?yes  Interpreter Needed?: No      Activities of Daily Living    04/15/2022   10:11 AM 03/24/2022    2:05 PM  In your present state of health, do you have any difficulty performing the following activities:  Hearing? 1 0  Comment has hearing  aids   Vision? 1 0  Comment wear glasses   Difficulty concentrating or making decisions? 0 0  Walking or climbing stairs? 1 0  Dressing or bathing? 0 0  Doing errands, shopping? 0 0  Preparing Food and eating ? N   Using the Toilet? N   In the past six months, have you accidently leaked urine? Y   Comment urge incontinence   Do you have problems with loss of bowel  control? N   Managing your Medications? N   Managing your Finances? N   Housekeeping or managing your Housekeeping? N     Patient Care Team: CoxElnita Maxwell, MD as PCP - General (Family Medicine) Sueanne Margarita, MD as PCP - Cardiology (Cardiology) Lane Hacker, [facility] (Pharmacist)  Indicate any recent Medical Services you may have received from other than [facility] providers in the past year (date may be approximate).     Assessment:   This is a routine wellness examination for Judy [name].  Hearing/Vision screen No results found.  Dietary issues and exercise activities discussed: Current Exercise Habits: The patient does not participate in regular exercise at present, Exercise limited by: orthopedic condition(s)   Goals Addressed             This Visit's Progress    Set My Weight Loss Goal       Follow Up Date 05/16/2022    - set weight loss goal    Why is this important?   Losing only 5 to 15 percent of your weight makes a big difference in your health.    Notes: Patient wants to lose 5 pounds in one month.      Depression Screen    04/15/2022   10:10 AM 03/24/2022    2:05 PM 03/24/2022    2:04 PM 12/16/2021    3:38 PM 09/07/2021    3:48 PM 04/23/2021   11:07 AM 01/26/2021    2:43 PM  PHQ 2/9 Scores  PHQ - 2 Score 0 0 0 0 0 0 0  PHQ- 9 Score 3 5  0       Fall Risk    04/15/2022   10:10 AM 03/24/2022    2:04 PM 12/19/2021    6:03 PM 09/07/2021    3:48 PM 04/23/2021   11:07 AM  Fall Risk   Falls in the past year? 0 0 0 0 0  Number falls in past yr: 0 0 0 0 0  Injury with Fall? 0 0 0 0 0  Risk for fall due to : History of fall(s);Impaired balance/gait No Fall Risks No Fall Risks Impaired balance/gait   Follow up Falls prevention discussed Falls evaluation completed Falls evaluation completed Falls evaluation completed Falls evaluation completed    [place]:  Any stairs in or around the home? Yes  If so, are there any without handrails?  Yes  Home free of loose throw rugs in walkways, pet beds, electrical cords, etc? Yes  Adequate lighting in your home to reduce risk of falls? Yes   ASSISTIVE DEVICES UTILIZED TO PREVENT FALLS:  Life alert? Yes  Use of a cane, walker or w/c? Yes  Grab bars in the bathroom? Yes  Shower chair or bench in shower? Yes  Elevated toilet seat or a handicapped toilet? No   TIMED UP AND GO:  Was the test performed? No .   Cognitive Function:        04/15/2022   10:15 AM  01/26/2021    2:47 PM  6CIT Screen  What Year? 0 points 0 points  What month? 0 points 0 points  What time? 0 points 0 points  Count back from 20 0 points 0 points  Months in reverse 0 points 0 points  Repeat phrase 0 points 0 points  Total Score 0 points 0 points    Immunizations Immunization History  Administered Date(s) Administered   COVID-19, mRNA, vaccine(Comirnaty)12 years and older 12/16/2021   Influenza Inj Mdck Quad Pf 01/13/2020, 10/15/2020, 12/16/2021   Influenza Split 10/15/2013, 11/24/2014   Influenza-Unspecified 11/14/2012, 11/04/2018   PFIZER(Purple Top)SARS-COV-2 Vaccination 05/09/2019, 06/03/2019, 11/14/2019   Pfizer Covid-19 Vaccine Bivalent Booster 53yr & up 11/10/2020   Pneumococcal Polysaccharide-23 12/05/2013, 11/28/2014   Pneumococcal-Unspecified 10/15/2013   Tdap 09/30/2015    TDAP status: Up to date  Flu Vaccine status: Up to date  Pneumococcal vaccine status: Up to date  Covid-19 vaccine status: Completed vaccines  Qualifies for Shingles Vaccine? Yes   Zostavax completed No   Shingrix Completed?: No.    Education has been provided regarding the importance of this vaccine. Patient has been advised to call insurance company to determine out of pocket expense if they have not yet received this vaccine. Advised may also receive vaccine at local pharmacy or Health Dept. Verbalized acceptance and understanding.  Screening Tests Health Maintenance  Topic Date Due   DEXA SCAN   Never done   Diabetic kidney evaluation - Urine ACR  Never done   Hepatitis C Screening  Never done   Zoster Vaccines- Shingrix (1 of 2) Never done   COVID-19 Vaccine (6 - 2023-24 season) 02/10/2022   OPHTHALMOLOGY EXAM  03/01/2022   MAMMOGRAM  05/13/2022   HEMOGLOBIN A1C  09/19/2022   Diabetic kidney evaluation - eGFR measurement  03/22/2023   FOOT EXAM  03/25/2023   Medicare Annual Wellness (AWV)  04/15/2023   DTaP/Tdap/Td (2 - Td or Tdap) 09/29/2025   COLONOSCOPY (Pts 45-43yrInsurance coverage will need to be confirmed)  01/19/2032   INFLUENZA VACCINE  Completed   HPV VACCINES  Aged Out   HIV Screening  Discontinued    Health Maintenance  Health Maintenance Due  Topic Date Due   DEXA SCAN  Never done   Diabetic kidney evaluation - Urine ACR  Never done   Hepatitis C Screening  Never done   Zoster Vaccines- Shingrix (1 of 2) Never done   COVID-19 Vaccine (6 - 2023-24 season) 02/10/2022   OPHTHALMOLOGY EXAM  03/01/2022    Colorectal cancer screening: Type of screening: Colonoscopy. Completed 01/18/2022. Repeat every 10 years  Mammogram status: Ordered 06/10/2022. Pt provided with contact info and advised to call to schedule appt.   Bone Density status: Ordered 04/15/2022. Pt provided with contact info and advised to call to schedule appt.  Lung Cancer Screening: (Low Dose CT Chest recommended if Age 62-80ears, 30 pack-year currently smoking OR have quit w/in 15years.) does not qualify.     Additional Screening:  Hepatitis C Screening: does not qualify.  Vision Screening: Recommended annual ophthalmology exams for early detection of glaucoma and other disorders of the eye. Is the patient up to date with their annual eye exam?  No  She has an appointment on 06/07/2022. Who is the provider or what is the name of the office in which the patient attends annual eye exams? Dr CoDoy Mince If pt is not established with a provider, would they like to be referred to a provider  to  establish care? No .   Dental Screening: Recommended annual dental exams for proper oral hygiene  Community Resource Referral / Chronic Care Management: CRR required this visit?  No   CCM required this visit?  No      Plan:     I have personally reviewed and noted the following in the patient's chart:   Medical and social history Use of alcohol, tobacco or illicit drugs  Current medications and supplements including opioid prescriptions. Patient is not currently taking opioid prescriptions. Functional ability and status Nutritional status Physical activity Advanced directives List of other physicians Hospitalizations, surgeries, and ER visits in previous 12 months Vitals Screenings to include cognitive, depression, and falls Referrals and appointments  In addition, I have reviewed and discussed with patient certain preventive protocols, quality metrics, and best practice recommendations. A written personalized care plan for preventive services as well as general preventive health recommendations were provided to patient.     Augustin Coupe, [place]   04/15/2022   Nurse Notes: Patient's goal is to weight loss. She has back pain and for that reason she is not able to move and do ADL's. She states if her back pain will improve, she will be able to do more things and the house and exercise to lose weight. Bone density was ordered today. She will scheduled an appointment at the pharmacy for shingrix vaccine and she has an appointment with ophthalmologist on 06/07/2022.  I spent 40 minutes with patient by phone.

## 2022-04-27 ENCOUNTER — Telehealth: Payer: Self-pay

## 2022-04-27 NOTE — Progress Notes (Signed)
Care Management & Coordination Services Pharmacy Team  Reason for Encounter: Hypertension  Contacted patient to discuss hypertension disease state. Spoke with patient on 05/03/2022     Current antihypertensive regimen:  Carvedilol 6.25mg  twice daily  Sacubitrill-Valsartan 24-26mg  two times daily  Patient verbally confirms she is taking the above medications as directed. Yes  How often are you checking your Blood Pressure? when feeling symptomatic  she checks her blood pressure in the morning before taking her medication.  Current home BP readings: 05/03/22 115/90 73, 05/04/22 119/80 80, 05/05/22 120/79 74, 05/06/22 126/80 78, 05/10/22 113/73 75.  Wrist or arm cuff: Arm  Caffeine intake:Tea 3x times a week  Salt intake:Limited OTC medications including pseudoephedrine or NSAIDs? Tylenol PRN  Any readings above 180/100? No  What recent interventions/DTPs have been made by any provider to improve Blood Pressure control since last CPP Visit: No recent changes   Any recent hospitalizations or ED visits since last visit with CPP? No  What diet changes have been made to improve Blood Pressure Control?  No diet changes   What exercise is being done to improve your Blood Pressure Control?  Pt does chair exercise 2-3 times a week   Adherence Review: Is the patient currently on ACE/ARB medication? Yes Does the patient have >5 day gap between last estimated fill dates? No  Star Rating Drugs:  Medication:  Last Fill: Day Supply Sacubitrill-Valsartan Gets PAP LF 01/20/22 90ds   Pt stated there was a dose change which caused her to have an oversupply  Chart Updates: Recent office visits:  04/15/22 Leal-Borjas Marla CMA. Seen for Medicare Annual Wellness. No med changes.   04/05/22 Rochel Brome MD. Seen for sinusitis. Started on Augmentin 875-12.5mg .   03/24/22 Rochel Brome MD. Seen for routine visit. No med changes.  03/18/22 Rochel Brome MD. Seen for URI. Started on Azithromycin 250mg  and  Prednisone 20mg .   Recent consult visits:  None  Hospital visits:  None  Medications: Outpatient Encounter Medications as of 04/27/2022  Medication Sig   albuterol (VENTOLIN HFA) 108 (90 Base) MCG/ACT inhaler Inhale 2 puffs into the lungs every 6 (six) hours as needed for wheezing or shortness of breath.   aspirin 81 MG tablet Take 81 mg by mouth at bedtime.    atorvastatin (LIPITOR) 20 MG tablet TAKE 1 TABLET EVERY DAY   buPROPion (WELLBUTRIN XL) 300 MG 24 hr tablet TAKE 1 TABLET EVERY DAY   carvedilol (COREG) 6.25 MG tablet TAKE 1 TABLET TWICE DAILY   clonazePAM (KLONOPIN) 0.5 MG tablet Take 1 tablet (0.5 mg total) by mouth 2 (two) times daily as needed for anxiety (panic attacks).   Coenzyme Q10 (CO Q 10) 100 MG CAPS Take 200 mg by mouth daily.   diphenhydrAMINE (BENADRYL) 25 MG tablet Take 50 mg by mouth at bedtime as needed.   Dulaglutide (TRULICITY) 3 0000000 SOPN Inject 3 mg as directed once a week.   FARXIGA 10 MG TABS tablet Take 1 tablet (10 mg total) by mouth daily.   fluticasone (FLONASE) 50 MCG/ACT nasal spray SPRAY 2 SPRAYS INTO EACH NOSTRIL EVERY DAY   gabapentin (NEURONTIN) 800 MG tablet TAKE 1 TABLET THREE TIMES DAILY   ipratropium-albuterol (DUONEB) 0.5-2.5 (3) MG/3ML SOLN Take 3 mLs by nebulization every 4 (four) hours as needed.   levothyroxine (SYNTHROID) 75 MCG tablet TAKE 1 TABLET (75 MCG TOTAL) BY MOUTH DAILY.   Lutein 20 MG TABS Take 1 tablet by mouth daily.   meloxicam (MOBIC) 15 MG tablet Take  1 tablet (15 mg total) by mouth daily.   Multiple Vitamin (MULTIVITAMIN WITH MINERALS) TABS tablet Take 1 tablet by mouth daily.   omeprazole (PRILOSEC) 20 MG capsule Take 20 mg by mouth daily.   OXYGEN 2lpm 2 lpm as needed   sacubitril-valsartan (ENTRESTO) 24-26 MG Take 1 tablet by mouth 2 (two) times daily.   Tiotropium Bromide-Olodaterol (STIOLTO RESPIMAT) 2.5-2.5 MCG/ACT AERS INHALE 2 PUFFS INTO THE LUNGS DAILY.   tiZANidine (ZANAFLEX) 4 MG tablet TAKE 1 TABLET  EVERY 6 HOURS AS NEEDED FOR MUSCLE SPASM(S)   traMADol (ULTRAM) 50 MG tablet Take 1 tablet (50 mg total) by mouth every 6 (six) hours as needed for severe pain. Take one four times a day as needed for pain   UNABLE TO FIND CPAP with o2 2lpm  DME- [facility]   VASCEPA 1 g capsule TAKE 2 CAPSULES BY MOUTH TWICE A DAY   Vitamin D, Ergocalciferol, (DRISDOL) 1.25 MG (50000 UNIT) CAPS capsule TAKE 1 CAPSULE EVERY 7 DAYS   No facility-administered encounter medications on file as of 04/27/2022.    Recent Office Vitals: BP Readings from Last 3 Encounters:  04/15/22 124/74  04/05/22 128/78  03/24/22 110/64   Pulse Readings from Last 3 Encounters:  04/15/22 67  04/05/22 76  03/24/22 88    Wt Readings from Last 3 Encounters:  04/15/22 272 lb (123.4 kg)  04/05/22 272 lb 12.8 oz (123.7 kg)  03/24/22 270 lb (122.5 kg)     Kidney Function Lab Results  Component Value Date/Time   CREATININE 0.70 03/21/2022 08:41 AM   CREATININE 0.76 12/13/2021 08:44 AM   CREATININE 0.75 12/09/2015 10:57 AM   CREATININE 0.69 10/23/2015 02:37 PM   GFR 100.87 10/01/2014 10:54 AM   GFRNONAA >60 02/27/2021 08:51 AM   GFRAA 87 04/06/2020 02:50 PM       Latest Ref Rng & Units 03/21/2022    8:41 AM 12/13/2021    8:44 AM 09/03/2021    8:36 AM  BMP  Glucose 70 - 99 mg/dL 94  78  132   BUN 8 - 27 mg/dL 15  14  18    Creatinine 0.57 - 1.00 mg/dL 0.70  0.76  0.78   BUN/Creat Ratio 12 - 28 21  18  23    Sodium 134 - 144 mmol/L 145  146  142   Potassium 3.5 - 5.2 mmol/L 4.5  4.3  4.3   Chloride 96 - 106 mmol/L 106  109  105   CO2 20 - 29 mmol/L 23  24  23    Calcium 8.7 - 10.3 mg/dL 9.4  9.2  9.6      Elray Mcgregor, [place] Pharmacist Assistant  [phone]

## 2022-05-02 ENCOUNTER — Ambulatory Visit: Payer: Medicare HMO | Admitting: Family Medicine

## 2022-05-03 ENCOUNTER — Other Ambulatory Visit: Payer: Self-pay | Admitting: Family Medicine

## 2022-05-03 DIAGNOSIS — E7849 Other hyperlipidemia: Secondary | ICD-10-CM

## 2022-05-09 ENCOUNTER — Telehealth: Payer: Self-pay

## 2022-05-09 NOTE — Progress Notes (Cosign Needed)
Patient re-enrolled in [place]: Industry ID [id number]   PATIENT [place] Approved   SUB STATUS Active   START DATE 05/09/2022   END DATE 05/08/2023   ASSISTANCE TYPE Co-pay    MEDICARE VERIFICATION STATUS Passed  Pattricia Boss, [place] Pharmacist Assistant [phone]

## 2022-05-10 ENCOUNTER — Encounter: Payer: Self-pay | Admitting: Family Medicine

## 2022-05-25 ENCOUNTER — Telehealth: Payer: Self-pay

## 2022-05-25 NOTE — Telephone Encounter (Signed)
I left a message on the number(s) listed in the patients chart requesting the patient to call back regarding the upcomming appointment for 06/24/2022. The provider is out of the office that day. The appointment has been canceled. Waiting for the patient to return the call. 

## 2022-05-28 ENCOUNTER — Other Ambulatory Visit: Payer: Self-pay | Admitting: Family Medicine

## 2022-06-07 DIAGNOSIS — H5213 Myopia, bilateral: Secondary | ICD-10-CM | POA: Diagnosis not present

## 2022-06-07 DIAGNOSIS — Z7985 Long-term (current) use of injectable non-insulin antidiabetic drugs: Secondary | ICD-10-CM | POA: Diagnosis not present

## 2022-06-07 DIAGNOSIS — E119 Type 2 diabetes mellitus without complications: Secondary | ICD-10-CM | POA: Diagnosis not present

## 2022-06-07 LAB — HM DIABETES EYE EXAM

## 2022-06-10 ENCOUNTER — Ambulatory Visit
Admission: RE | Admit: 2022-06-10 | Discharge: 2022-06-10 | Disposition: A | Discharge status: Home / Self Care | Payer: Medicare HMO | Source: Ambulatory Visit | Attending: Family Medicine | Admitting: Family Medicine

## 2022-06-10 DIAGNOSIS — Z1231 Encounter for screening mammogram for malignant neoplasm of breast: Secondary | ICD-10-CM

## 2022-06-13 ENCOUNTER — Telehealth: Payer: Self-pay

## 2022-06-13 NOTE — Progress Notes (Signed)
Care Management & Coordination Services Pharmacy Team  Reason for Encounter: Hypertension  Contacted patient to discuss hypertension disease state. Spoke with patient on 06/13/2022     Current antihypertensive regimen:  Carvedilol 6.25mg  twice daily  Sacubitrill-Valsartan 24-26mg  two times daily Patient verbally confirms she is taking the above medications as directed. Yes  How often are you checking your Blood Pressure? when feeling symptomatic  she checks her blood pressure in the afternoon after taking her medication.  Current home BP readings:  Last week 120/78, this week 06/13/22 128/81  Wrist or arm cuff: Arm  Caffeine intake:Tea 3x times a week  Salt intake:Limited OTC medications including pseudoephedrine or NSAIDs? Tylenol PRN  Any readings above 180/100? No   What recent interventions/DTPs have been made by any provider to improve Blood Pressure control since last CPP Visit: No recent changes   Any recent hospitalizations or ED visits since last visit with CPP? No  What diet changes have been made to improve Blood Pressure Control?  Her and husband are trying to include more greens to their diet  What exercise is being done to improve your Blood Pressure Control?  Pt does chair exercise 2-3 times a week. Pt also uses a weight machine  Adherence Review: Is the patient currently on ACE/ARB medication? Yes Does the patient have >5 day gap between last estimated fill dates? No  Star Rating Drugs:  Medication:  Last Fill: Day Supply Sacubitrill-Valsartan Gets PAP LF 05/04/22 90ds-01/20/22 90ds   Chart Updates: Recent office visits:  None  Recent consult visits:  None  Hospital visits:  None  Medications: Outpatient Encounter Medications as of 06/13/2022  Medication Sig   albuterol (VENTOLIN HFA) 108 (90 Base) MCG/ACT inhaler Inhale 2 puffs into the lungs every 6 (six) hours as needed for wheezing or shortness of breath.   aspirin 81 MG tablet Take 81 mg by  mouth at bedtime.    atorvastatin (LIPITOR) 20 MG tablet TAKE 1 TABLET EVERY DAY   buPROPion (WELLBUTRIN XL) 300 MG 24 hr tablet TAKE 1 TABLET EVERY DAY   carvedilol (COREG) 6.25 MG tablet TAKE 1 TABLET TWICE DAILY   clonazePAM (KLONOPIN) 0.5 MG tablet Take 1 tablet (0.5 mg total) by mouth 2 (two) times daily as needed for anxiety (panic attacks).   Coenzyme Q10 (CO Q 10) 100 MG CAPS Take 200 mg by mouth daily.   diphenhydrAMINE (BENADRYL) 25 MG tablet Take 50 mg by mouth at bedtime as needed.   Dulaglutide (TRULICITY) 3 MG/0.5ML SOPN Inject 3 mg as directed once a week.   FARXIGA 10 MG TABS tablet Take 1 tablet (10 mg total) by mouth daily.   fluticasone (FLONASE) 50 MCG/ACT nasal spray SPRAY 2 SPRAYS INTO EACH NOSTRIL EVERY DAY   gabapentin (NEURONTIN) 800 MG tablet TAKE 1 TABLET THREE TIMES DAILY   ipratropium-albuterol (DUONEB) 0.5-2.5 (3) MG/3ML SOLN Take 3 mLs by nebulization every 4 (four) hours as needed.   levothyroxine (SYNTHROID) 75 MCG tablet TAKE 1 TABLET (75 MCG TOTAL) BY MOUTH DAILY.   Lutein 20 MG TABS Take 1 tablet by mouth daily.   meloxicam (MOBIC) 15 MG tablet Take 1 tablet (15 mg total) by mouth daily.   Multiple Vitamin (MULTIVITAMIN WITH MINERALS) TABS tablet Take 1 tablet by mouth daily.   omeprazole (PRILOSEC) 20 MG capsule Take 20 mg by mouth daily.   OXYGEN 2lpm 2 lpm as needed   sacubitril-valsartan (ENTRESTO) 24-26 MG Take 1 tablet by mouth 2 (two) times daily.  Tiotropium Bromide-Olodaterol (STIOLTO RESPIMAT) 2.5-2.5 MCG/ACT AERS INHALE 2 PUFFS INTO THE LUNGS DAILY.   tiZANidine (ZANAFLEX) 4 MG tablet TAKE 1 TABLET EVERY 6 HOURS AS NEEDED FOR MUSCLE SPASM(S)   traMADol (ULTRAM) 50 MG tablet Take 1 tablet (50 mg total) by mouth every 6 (six) hours as needed for severe pain. Take one four times a day as needed for pain   UNABLE TO FIND CPAP with o2 2lpm  DME- [facility]   VASCEPA 1 g capsule TAKE 2 CAPSULES BY MOUTH TWICE A DAY   Vitamin D, Ergocalciferol, (DRISDOL)  1.25 MG (50000 UNIT) CAPS capsule TAKE 1 CAPSULE EVERY 7 DAYS   No facility-administered encounter medications on file as of 06/13/2022.    Recent Office Vitals: BP Readings from Last 3 Encounters:  04/15/22 124/74  04/05/22 128/78  03/24/22 110/64   Pulse Readings from Last 3 Encounters:  04/15/22 67  04/05/22 76  03/24/22 88    Wt Readings from Last 3 Encounters:  04/15/22 272 lb (123.4 kg)  04/05/22 272 lb 12.8 oz (123.7 kg)  03/24/22 270 lb (122.5 kg)     Kidney Function Lab Results  Component Value Date/Time   CREATININE 0.70 03/21/2022 08:41 AM   CREATININE 0.76 12/13/2021 08:44 AM   CREATININE 0.75 12/09/2015 10:57 AM   CREATININE 0.69 10/23/2015 02:37 PM   GFR 100.87 10/01/2014 10:54 AM   GFRNONAA >60 02/27/2021 08:51 AM   GFRAA 87 04/06/2020 02:50 PM       Latest Ref Rng & Units 03/21/2022    8:41 AM 12/13/2021    8:44 AM 09/03/2021    8:36 AM  BMP  Glucose 70 - 99 mg/dL 94  78  161   BUN 8 - 27 mg/dL 15  14  18    Creatinine 0.57 - 1.00 mg/dL 0.96  0.45  4.09   BUN/Creat Ratio 12 - 28 21  18  23    Sodium 134 - 144 mmol/L 145  146  142   Potassium 3.5 - 5.2 mmol/L 4.5  4.3  4.3   Chloride 96 - 106 mmol/L 106  109  105   CO2 20 - 29 mmol/L 23  24  23    Calcium 8.7 - 10.3 mg/dL 9.4  9.2  9.6      Roxana Hires, [facility] Clinical Pharmacist Assistant  [phone]

## 2022-06-16 ENCOUNTER — Telehealth: Payer: Self-pay | Admitting: Family Medicine

## 2022-06-16 NOTE — Telephone Encounter (Signed)
Pt called today to request a same day appointment for the following symptoms:sinus sxs started 3 days ago. Unfortunately, our schedule is full and we have no openings between today or tomorrow. Pt was notified to go to Urgent Care or if they have MyChart they are able to login to do a e-visit with a [facility] Provider from home.

## 2022-06-19 ENCOUNTER — Other Ambulatory Visit: Payer: Self-pay | Admitting: Cardiology

## 2022-06-21 ENCOUNTER — Telehealth: Payer: Self-pay

## 2022-06-21 DIAGNOSIS — D225 Melanocytic nevi of trunk: Secondary | ICD-10-CM | POA: Diagnosis not present

## 2022-06-21 DIAGNOSIS — D2272 Melanocytic nevi of left lower limb, including hip: Secondary | ICD-10-CM | POA: Diagnosis not present

## 2022-06-21 DIAGNOSIS — H0262 Xanthelasma of right lower eyelid: Secondary | ICD-10-CM | POA: Diagnosis not present

## 2022-06-21 DIAGNOSIS — L821 Other seborrheic keratosis: Secondary | ICD-10-CM | POA: Diagnosis not present

## 2022-06-21 DIAGNOSIS — D2271 Melanocytic nevi of right lower limb, including hip: Secondary | ICD-10-CM | POA: Diagnosis not present

## 2022-06-21 DIAGNOSIS — D2262 Melanocytic nevi of left upper limb, including shoulder: Secondary | ICD-10-CM | POA: Diagnosis not present

## 2022-06-21 DIAGNOSIS — D2261 Melanocytic nevi of right upper limb, including shoulder: Secondary | ICD-10-CM | POA: Diagnosis not present

## 2022-06-21 NOTE — Progress Notes (Signed)
Care Management & Coordination Services Pharmacy Team  Reason for Encounter: Appointment Reminder  Contacted patient to confirm telephone appointment with Artelia Laroche, PharmD on 06/23/22 at 10:00 am.  Spoke with patient on 06/21/2022   Do you have any problems getting your medications? Yes If yes what types of problems are you experiencing? Financial barriers, pt was denied for the [employer] stated they made too much money.   What is your top health concern you would like to discuss at your upcoming visit? No top concerns   Have you seen any other providers since your last visit with PCP? No   Chart review:  Recent office visits:  None  Recent consult visits:  None  Hospital visits:  None   Star Rating Drugs:  Medication:  Last Fill: Day Supply Atorvastatin   06/21/22-04/07/22 90ds Trulicity   PAP LF April 2024 120ds Farxiga  PAP Pt was denied  Entresto   PAP LF 05/04/22 90ds-01/20/22 90ds   Care Gaps: Annual wellness visit in last year? Yes Dexa Scan: Never done  If Diabetic: Last eye exam / retinopathy screening: Overdue since 03/01/22 Last diabetic foot exam:03/24/22   Roxana Hires, [facility] Clinical Pharmacist Assistant  [phone]

## 2022-06-22 ENCOUNTER — Other Ambulatory Visit: Payer: Medicare HMO

## 2022-06-23 ENCOUNTER — Ambulatory Visit: Payer: Medicare HMO

## 2022-06-23 NOTE — Patient Outreach (Signed)
Care Management & Coordination Services Pharmacy Note  06/23/2022 Name:  Judy [name] MRN:  [id number] DOB:  05-Sep-1960  Summary: -Very pleasant female presents for f/u visit. She lives with her husband Gerlene Burdock and disabled son who is in his 68's  Recommendations/Changes made from today's visit: -Will appeal PAP denial for [place]   Subjective: Judy [name] is an 62 y.o. year old female who is a primary patient of Cox, Kirsten, MD.  The care coordination team was consulted for assistance with disease management and care coordination needs.    Engaged with patient by telephone for follow up visit.   Recent office visits:  None   Recent consult visits:  None   Hospital visits:  None     Objective:  Lab Results  Component Value Date   CREATININE 0.70 03/21/2022   BUN 15 03/21/2022   GFR 100.87 10/01/2014   EGFR 98 03/21/2022   GFRNONAA >60 02/27/2021   GFRAA 87 04/06/2020   NA 145 (H) 03/21/2022   K 4.5 03/21/2022   CALCIUM 9.4 03/21/2022   CO2 23 03/21/2022   GLUCOSE 94 03/21/2022    Lab Results  Component Value Date/Time   HGBA1C 5.9 (H) 03/21/2022 08:41 AM   HGBA1C 6.0 (H) 12/13/2021 08:44 AM   GFR 100.87 10/01/2014 10:54 AM   GFR 84.34 05/13/2014 02:32 PM   MICROALBUR Negative 09/03/2019 02:33 PM    Last diabetic Eye exam:  Lab Results  Component Value Date/Time   HMDIABEYEEXA No Retinopathy 03/01/2021 12:00 AM    Last diabetic Foot exam: No results found for: "HMDIABFOOTEX"   Lab Results  Component Value Date   CHOL 135 03/21/2022   HDL 38 (L) 03/21/2022   LDLCALC 67 03/21/2022   TRIG 181 (H) 03/21/2022   CHOLHDL 3.6 03/21/2022       Latest Ref Rng & Units 03/21/2022    8:41 AM 12/13/2021    8:44 AM 09/03/2021    8:36 AM  Hepatic Function  Total Protein 6.0 - 8.5 g/dL 6.2  6.0  6.2   Albumin 3.9 - 4.9 g/dL 4.3  4.0  4.0   AST 0 - 40 IU/L 21  22  18    ALT 0 - 32 IU/L 31  43  23   Alk Phosphatase 44 - 121 IU/L 176  149  172   Total  Bilirubin 0.0 - 1.2 mg/dL 1.0  0.7  0.9     Lab Results  Component Value Date/Time   TSH 1.400 12/13/2021 08:44 AM   TSH 2.060 09/03/2021 08:37 AM   FREET4 1.19 07/17/2019 01:06 PM       Latest Ref Rng & Units 03/21/2022    8:41 AM 12/13/2021    8:44 AM 09/03/2021    8:36 AM  CBC  WBC 3.4 - 10.8 x10E3/uL 6.6  5.7  8.0   Hemoglobin 11.1 - 15.9 g/dL 40.9  81.1  91.4   Hematocrit 34.0 - 46.6 % 43.3  41.1  42.3   Platelets 150 - 450 x10E3/uL 248  225  234     Lab Results  Component Value Date/Time   VD25OH CANCELED 09/03/2021 08:37 AM   VD25OH 37.5 07/17/2019 01:06 PM    Clinical ASCVD: Yes  The 10-year ASCVD risk score (Arnett DK, et al., 2019) is: 16.4%   Values used to calculate the score:     Age: 39 years     Sex: Female     Is Non-Hispanic African American: No  Diabetic: Yes     Tobacco smoker: Yes     Systolic Blood Pressure: 124 mmHg     Is BP treated: Yes     HDL Cholesterol: 38 mg/dL     Total Cholesterol: 135 mg/dL    Other: ([id number] if Afib, MMRC or CAT for COPD, ACT, DEXA)     04/15/2022   10:10 AM 03/24/2022    2:05 PM 03/24/2022    2:04 PM  Depression screen PHQ 2/9  Decreased Interest 0 0 0  Down, Depressed, Hopeless 0 0 0  PHQ - 2 Score 0 0 0  Altered sleeping 3 2   Tired, decreased energy 0 1   Change in appetite 0 0   Feeling bad or failure about yourself  0 0   Trouble concentrating 0 2   Moving slowly or fidgety/restless 0 0   Suicidal thoughts 0 0   PHQ-9 Score 3 5   Difficult doing work/chores Not difficult at all Not difficult at all      Social History   Tobacco Use  Smoking Status Former   Packs/day: 1.00   Years: 20.00   Additional pack years: 0.00   Total pack years: 20.00   Types: Cigarettes   Quit date: 02/14/2005   Years since quitting: 17.3  Smokeless Tobacco Never   BP Readings from Last 3 Encounters:  04/15/22 124/74  04/05/22 128/78  03/24/22 110/64   Pulse Readings from Last 3 Encounters:  04/15/22 67   04/05/22 76  03/24/22 88   Wt Readings from Last 3 Encounters:  04/15/22 272 lb (123.4 kg)  04/05/22 272 lb 12.8 oz (123.7 kg)  03/24/22 270 lb (122.5 kg)   BMI Readings from Last 3 Encounters:  04/15/22 45.26 kg/m  04/05/22 45.40 kg/m  03/24/22 44.93 kg/m    Allergies  Allergen Reactions   Meperidine Nausea And Vomiting and Other (See Comments)    Medications Reviewed Today     Reviewed by Eugenie Norrie, CMA (Certified Medical Assistant) on 04/15/22 at 1022  Med List Status: <None>   Medication Order Taking? Sig Documenting Provider Last Dose Status Informant  albuterol (VENTOLIN HFA) 108 (90 Base) MCG/ACT inhaler [id number]  Inhale 2 puffs into the lungs every 6 (six) hours as needed for wheezing or shortness of breath. Cox, Kirsten, MD  Active   amoxicillin-clavulanate (AUGMENTIN) 875-125 MG tablet [id number]  Take 1 tablet by mouth 2 (two) times daily. Blane Ohara, MD  Active   aspirin 81 MG tablet [id number]  Take 81 mg by mouth at bedtime.  [provider]  Active Self  atorvastatin (LIPITOR) 20 MG tablet [id number]  TAKE 1 TABLET EVERY DAY Marianne Sofia, PA-C  Active   buPROPion (WELLBUTRIN XL) 300 MG 24 hr tablet [id number]  TAKE 1 TABLET EVERY DAY Cox, Kirsten, MD  Active   carvedilol (COREG) 6.25 MG tablet [id number]  TAKE 1 TABLET TWICE DAILY Turner, Cornelious Bryant, MD  Active   clonazePAM (KLONOPIN) 0.5 MG tablet [id number]  Take 1 tablet (0.5 mg total) by mouth 2 (two) times daily as needed for anxiety (panic attacks). Cox, Kirsten, MD  Active   Coenzyme Q10 (CO Q 10) 100 MG CAPS [id number]  Take 200 mg by mouth daily. [provider]  Active   diphenhydrAMINE (BENADRYL) 25 MG tablet [id number]  Take 50 mg by mouth at bedtime as needed. [provider]  Active   Dulaglutide (TRULICITY) 3 MG/0.5ML SOPN [id number]  Inject 3 mg as directed once a week.  Cox, Kirsten, MD  Active   FARXIGA 10 MG TABS tablet [id number]  Take 1 tablet (10 mg total) by mouth  daily. Cox, Kirsten, MD  Active   fluticasone [facility]) 50 MCG/ACT nasal spray [id number]  SPRAY 2 SPRAYS INTO EACH NOSTRIL EVERY DAY Cox, Kirsten, MD  Active   gabapentin (NEURONTIN) 800 MG tablet [id number]  TAKE 1 TABLET THREE TIMES DAILY Cox, Kirsten, MD  Active   ipratropium-albuterol (DUONEB) 0.5-2.5 (3) MG/3ML SOLN [id number]  Take 3 mLs by nebulization every 4 (four) hours as needed. Cox, Kirsten, MD  Active   levothyroxine (SYNTHROID) 75 MCG tablet [id number]  TAKE 1 TABLET (75 MCG TOTAL) BY MOUTH DAILY. Blane Ohara, MD  Active   Lutein 20 MG TABS [id number]  Take 1 tablet by mouth daily. [provider]  Active   meloxicam (MOBIC) 15 MG tablet [id number]  Take 1 tablet (15 mg total) by mouth daily. Cox, Kirsten, MD  Active   Multiple Vitamin (MULTIVITAMIN WITH MINERALS) TABS tablet [id number]  Take 1 tablet by mouth daily. [provider]  Active Self  omeprazole (PRILOSEC) 20 MG capsule [id number]  Take 20 mg by mouth daily. [provider]  Active   OXYGEN [id number]  2lpm 2 lpm as needed [provider]  Active   sacubitril-valsartan (ENTRESTO) 24-26 MG [id number]  Take 1 tablet by mouth 2 (two) times daily. Cox, Kirsten, MD  Active   Tiotropium Bromide-Olodaterol (STIOLTO RESPIMAT) 2.5-2.5 MCG/ACT AERS [id number]  INHALE 2 PUFFS INTO THE LUNGS DAILY. Nyoka Cowden, MD  Active   tiZANidine (ZANAFLEX) 4 MG tablet [id number]  TAKE 1 TABLET EVERY 6 HOURS AS NEEDED FOR MUSCLE SPASM(S) Cox, Kirsten, MD  Active   traMADol (ULTRAM) 50 MG tablet [id number]  Take 1 tablet (50 mg total) by mouth every 6 (six) hours as needed for severe pain. Take one four times a day as needed for pain Cox, Fritzi Mandes, MD  Active   UNABLE TO FIND [id number]  CPAP with o2 2lpm  DME- [facility] [provider]  Active   VASCEPA 1 g capsule [id number]  TAKE 2 CAPSULES BY MOUTH TWICE A DAY Cox, Kirsten, MD  Active   Vitamin D, Ergocalciferol, (DRISDOL) 1.25 MG (50000 UNIT) CAPS capsule  [id number]  TAKE 1 CAPSULE EVERY 7 DAYS Cox, Kirsten, MD  Active             SDOH:  (Social Determinants of Health) assessments and interventions performed: Yes SDOH Interventions    Flowsheet Row Care Coordination from 06/23/2022 in [facility] Clinical Support from 04/15/2022 in [facility] Office Visit from 03/24/2022 in [facility] Chronic Care Management from 12/21/2021 in [facility] Chronic Care Management from 07/21/2021 in [facility] Clinical Support from 01/26/2021 in [facility]  SDOH Interventions        Food Insecurity Interventions -- Intervention Not Indicated -- -- -- --  Housing Interventions -- Intervention Not Indicated -- -- -- --  Transportation Interventions -- Intervention Not Indicated -- Intervention Not Indicated Intervention Not Indicated Intervention Not Indicated  Utilities Interventions -- Intervention Not Indicated Intervention Not Indicated -- -- --  Alcohol Usage Interventions -- Intervention Not Indicated (Score <7) -- -- -- --  Depression Interventions/Treatment  -- PHQ2-9 Score <4 Follow-up Not Indicated Currently on Treatment -- -- --  Financial Strain Interventions Other (Comment)  [PAP] Other (Comment), Intervention Not Indicated -- Other (Comment) Other (Comment)  [  PAP (See CP)] --  Physical Activity Interventions -- Intervention Not Indicated -- -- -- --  Stress Interventions -- Intervention Not Indicated Intervention Not Indicated -- -- --  Social Connections Interventions -- Intervention Not Indicated Intervention Not Indicated -- -- --       Medication Assistance: (See note)   Name and location of Current pharmacy:  [facility] - [place], [place] - [address] [place] [address] Phone: [phone] Fax: [phone]  [facility] [address] [place] - [address] [place] [address] Phone: [phone] Fax: [phone]  [id number] [place], [place] - [address] [facility] [place] [address] Phone: [phone] Fax: [phone]  [facility] - [place], [place] - [address] [place] [address] Phone: [phone] Fax: [phone]   Compliance/Adherence/Medication fill history: Star Rating Drugs:  Medication:                Last Fill:         Day Supply Atorvastatin                 06/21/22-04/07/22 90ds Trulicity                        PAP LF April 2024 120ds Farxiga                        PAP Pt was denied  Entresto                       PAP LF 05/04/22 90ds-01/20/22 90ds    Care Gaps: Annual wellness visit in last year? Yes Dexa Scan: Never done   If Diabetic: Last eye exam / retinopathy screening: Overdue since 03/01/22 Last diabetic foot exam:03/24/22   Assessment/Plan  CAD: (LDL goal < 70) The 10-year ASCVD risk score (Arnett DK, et al., 2019) is: 16.4%   Values used to calculate the score:     Age: 9 years     Sex: Female     Is Non-Hispanic African American: No     Diabetic: Yes     Tobacco smoker: Yes     Systolic Blood Pressure: 124 mmHg     Is BP treated: Yes     HDL Cholesterol: 38 mg/dL     Total Cholesterol: 135 mg/dL Lab Results  Component Value Date   CHOL 135 03/21/2022   CHOL 130 12/13/2021   CHOL 131 09/03/2021   Lab Results  Component Value Date   HDL 38 (L) 03/21/2022   HDL 42 12/13/2021   HDL 40 09/03/2021   Lab Results  Component Value Date   LDLCALC 67 03/21/2022   LDLCALC 65 12/13/2021   LDLCALC 64 09/03/2021   Lab Results  Component Value Date   TRIG 181 (H) 03/21/2022   TRIG 128 12/13/2021   TRIG 154 (H) 09/03/2021   Lab Results  Component Value Date   CHOLHDL 3.6 03/21/2022   CHOLHDL 3.1 12/13/2021   CHOLHDL 3.3 09/03/2021   No results found for: "LDLDIRECT" Last vitamin D Lab Results  Component Value Date   VD25OH  CANCELED 09/03/2021   Lab Results  Component Value Date   TSH 1.400 12/13/2021  -Not ideally controlled -Current treatment: vascepa 1 gram 2 capsules twice daily Appropriate, Effective, Safe, Accessible 2024: [employer] grant Atorvastatin 20  mg daily Appropriate, Effective, Safe, Accessible Aspirin 81 mg daily at bedtime Appropriate, Effective, Safe, Accessible -Medications previously tried: none reported  -Current dietary patterns: cooks at home and watches salt intake -Current exercise habits: no formal exercise but has joined the [employer] and hopes to begin working out in the pool -Educated on Cholesterol goals;  Benefits of statin for ASCVD risk reduction; Importance of limiting foods high in cholesterol; Exercise goal of 150 minutes per week; -Counseled on diet and exercise extensively Assessed cost of Vascepa. Coordinated [employer]. [employer] cannot secondary bill Vascepa to [employer] so pharmacist coordinating [facility].  June 2023: Candidate for high intensity statin Recommended to continue current medication   Diabetes (A1c goal <7%) Lab Results  Component Value Date   HGBA1C 5.9 (H) 03/21/2022   HGBA1C 6.0 (H) 12/13/2021   HGBA1C 5.8 (H) 09/03/2021   Lab Results  Component Value Date   MICROALBUR Negative 09/03/2019   LDLCALC 67 03/21/2022   CREATININE 0.70 03/21/2022    Lab Results  Component Value Date   NA 145 (H) 03/21/2022   K 4.5 03/21/2022   CREATININE 0.70 03/21/2022   EGFR 98 03/21/2022   GFRNONAA >60 02/27/2021   GLUCOSE 94 03/21/2022    Lab Results  Component Value Date   WBC 6.6 03/21/2022   HGB 14.6 03/21/2022   HCT 43.3 03/21/2022   MCV 92 03/21/2022   PLT 248 03/21/2022    Lab Results  Component Value Date   MICROALBUR Negative 09/03/2019  -Controlled -Current medications: Trulicity 3 mg weekly Appropriate, Effective, Safe, Accessible 2023: PAP Approved 2024: PAP Approved Farxiga 10 mg daily Appropriate,  Effective, Safe, Query accessible 2023: PAP Approved 2024: PAP denied May -Medications previously tried: none reported  -Current home glucose readings fasting glucose:  June 2023: 90's-100's but patient rarely checks post prandial glucose: not checking  -Denies hypoglycemic/hyperglycemic symptoms -Current meal patterns:  Patient reports cooking at home mainly. Works to avoid sodium and buys no salt added options. States that she probably doesn't eat as well as she should.  -Current exercise: no formal exercise currently but has joined the [employer] for water aerobics.  -Educated on A1c and blood sugar goals; Complications of diabetes including kidney damage, retinal damage, and cardiovascular disease; Exercise goal of 150 minutes per week; Benefits of routine self-monitoring of blood sugar; -Counseled to check feet daily and get yearly eye exams -Counseled on diet and exercise extensively Recommended to continue current medication Collaborated with [employer] patient assistance to request Trulicity 3 mg weekly dose shipped to patient. Patient has had poor success with Libre 2 sample sticking to her arm. Pharmacist provided patch to assist with this.  Assessed patient's eligibility for [employer] patient assistance program.  [place]. Discussed benefit for heart failure, kidney and blood sugar management.   June 2023: Patient rarely checks sugars. Counseled at length on importance November 2023: Renew PAP May 2024: Patient Farxiga PAP got denied due to making too much money. She stated she moved a CD from one account to another which is why she thinks she was denied. However, she got her taxes done for last year and it says,   Adjusted Gross Income 2023: $29,000  Her and her spouse make monthly Richard: $2,294  Joli: $1,366  Household: 3 people (They take care of disabled son in his 74's) Plan: Will work with team to appeal. Will also ask PCP if we could try  PAP for Jardiance as  well -Patient wants Farxiga sent to Pharmacy until PAP approved   Hypothyroid (Goal: manage TSH and symptoms of thyroid disease) Lab Results  Component Value Date   TSH 1.400 12/13/2021   T3TOTAL 131 07/17/2019  -Controlled -Current treatment  Levothyroxine 75 mcg daily Appropriate, Effective, Safe, Accessible -Medications previously tried: none reported  -Recommended to continue current medication   Heart Failure (Goal: manage symptoms and prevent exacerbations) BP Readings from Last 3 Encounters:  04/15/22 124/74  04/05/22 128/78  03/24/22 110/64    Pulse Readings from Last 3 Encounters:  04/15/22 67  04/05/22 76  03/24/22 88  -Controlled -Home BP Readings:              June 2023: Doesn't test, counseled on testing slightly more often, especially after Sx             November 2023: 124/76 (never in 130's) -Last ejection fraction: 55% (Date: 06/2017) -HF type: Systolic -NYHA Class: IIB (slight limitation of activity) -Current treatment: Entresto 24-26 mg bid Appropriate, Effective, Safe, Accessible 2024: PAP Approved Carvedilol 6.25 mg bid Appropriate, Effective, Safe, Accessible Farxiga 10mg  Appropriate, Effective, Safe, Query accessible 2023: PAP Approved 2024: PAP denied (May 2024) -Medications previously tried: Sherryll Burger higher doses -Current home BP/HR readings: 120-130/75-80 Pulse 70-80 -Current dietary habits: watches sodium and purchases no salt added products. Mainly cooks at home.  -Current exercise habits: no formal exercise currently. Has joined the [facility] and hopes to begin pool exercises.  -Educated on Benefits of medications for managing symptoms and prolonging life Importance of blood pressure control -Counseled on diet and exercise extensively Recommended to continue current medication Recommend continuing to monitor heart rate and blood pressure to avoid hypotension.    COPD (Goal: control symptoms and prevent  exacerbations) -Controlled -Current treatment  Albuterol inhaler 2 puffs into the lungs eery 6 hours prn wheezing or shortness of breath Appropriate, Effective, Safe, Accessible Stiolto 2 puffs daily Appropriate, Effective, Safe, Accessible 2023: PAP Approved 2024: PAP Approved -Medications previously tried:  tudorza, symbicort  -Gold Grade: Gold IIIB -Pulmonary function testing:  PF Readings from Last 3 Encounters:  No data found for PF  Pulmonary Functions Testing Results:  TLC  Date Value Ref Range Status  06/20/2017 6.32 L Final  - 11/27/2013  PFTs   FEV1  1.51 (54%) ratio 52 and and no better p B2 and DLCO  71 - 01/08/2014  p extensive coaching HFA effectiveness =    90% > rec resume symbicort 160 2bid  - 03/27/14 trial of dulera 200/tudorza samples only to assure adherence  04/10/2014   Alpha 1 MM , nml level  - trial off spiriva 05/22/14 / off all resp rx 08/22/14 - PFT's  10/09/2014  FEV1 1.64 (59 % ) ratio 54  p 11 % improvement from saba with DLCO  69 % corrects to 73  % for alv volume   - 07/14/2015  A  > try spiriva 2 puffs each am > no better so pt d/c'd  - PFT's  07/18/2017  FEV1 1.10 (41 % ) ratio 46   p 19 % improvement from saba p nothing prior to study with DLCO  70 % corrects to 75  % for alv volume  On coreg  - 07/18/2017    try stiolto  - Spirometry 12/04/2017  FEV1 1.3 (49%)  Ratio 49 p am stiolto x 2 puffs  w classic curvature - 06/26/2018  After extensive coaching inhaler device,  effectiveness =   90%  -Exacerbations  requiring treatment in last 6 months: none reported -Patient reports consistent use of maintenance inhaler -Frequency of rescue inhaler use: rarely but has one in case needed -Counseled on Benefits of consistent maintenance inhaler use Differences between maintenance and rescue inhalers -Recommended to continue current medication   Depression/Anxiety (Goal: manage symptoms of anxiety) -Controlled -Current treatment: clonazepam 0.5 mg bid prn anxiety  (Uses 2-3x/3 -6 months) Appropriate, Effective, Safe, Accessible Bupropion XL 300 mg daily Appropriate, Effective, Safe, Accessible -Medications previously tried/failed: none reported -PHQ9:     04/15/2022   10:10 AM 03/24/2022    2:05 PM 03/24/2022    2:04 PM  Depression screen PHQ 2/9  Decreased Interest 0 0 0  Down, Depressed, Hopeless 0 0 0  PHQ - 2 Score 0 0 0  Altered sleeping 3 2   Tired, decreased energy 0 1   Change in appetite 0 0   Feeling bad or failure about yourself  0 0   Trouble concentrating 0 2   Moving slowly or fidgety/restless 0 0   Suicidal thoughts 0 0   PHQ-9 Score 3 5   Difficult doing work/chores Not difficult at all Not difficult at all   -GAD7:      No data to display        -Discussed benefits of exercise, cognitive behavior therapy, meditation or journaling for mental health support -Educated on Benefits of medication for symptom control Benefits of cognitive-behavioral therapy with or without medication -Counseled on diet and exercise extensively Recommended to continue current medication Educated on benefits of symptom management for overall health.    Chronic Back Pain (Goal: manage symptoms) -Hardware in back since 1981 Pain Scale: 10/28/20 With Meds: 4/10 (Content on therapy and she's able to "do things around the house"_ Without Meds: 5-6/10 -Controlled -Current treatment  tizanidine 4 mg every 6 hours prn Appropriate, Effective, Safe, Accessible Tramadol 50 mg bid prn Appropriate, Effective, Safe, Accessible Gabapentin 800mg  TID Appropriate, Effective, Safe, Accessible -Medications previously tried: none reported  -Recommended to continue current medication Counseled on benefits of pool exercise.   CP F/U PRN  Artelia Laroche, Pharm.D. - [phone]

## 2022-06-24 ENCOUNTER — Telehealth: Payer: Self-pay

## 2022-06-24 ENCOUNTER — Ambulatory Visit: Payer: Medicare HMO | Admitting: Family Medicine

## 2022-06-24 NOTE — Progress Notes (Signed)
06-24-2022: Contacted patient to request recent tax return or son's social security. Patient stated she has already informed Artelia Laroche that she was over income.  Huey Romans [facility] Clinical Pharmacist Assistant [phone]

## 2022-06-27 ENCOUNTER — Telehealth: Payer: Self-pay

## 2022-06-27 NOTE — Progress Notes (Signed)
[employer] patient assistance application filled out for Jardiance. Will place in the mail to patient this week.     Billee Cashing, CMA Clinical Pharmacist Assistant [phone]

## 2022-06-27 NOTE — Progress Notes (Signed)
No problem.   Billee Cashing, CMA Clinical Pharmacist Assistant [phone]

## 2022-07-20 ENCOUNTER — Ambulatory Visit (INDEPENDENT_AMBULATORY_CARE_PROVIDER_SITE_OTHER): Payer: Medicare HMO | Admitting: Family Medicine

## 2022-07-20 VITALS — BP 124/68 | HR 81 | Temp 96.8°F | Ht 65.0 in | Wt 266.0 lb

## 2022-07-20 DIAGNOSIS — R21 Rash and other nonspecific skin eruption: Secondary | ICD-10-CM | POA: Diagnosis not present

## 2022-07-20 NOTE — Progress Notes (Signed)
Acute Office Visit  Subjective:    Patient ID: Judy [name], female    DOB: May 30, 1960, 62 y.o.   MRN: [id number]  Chief Complaint  Patient presents with   Rash    HPI: Patient is in today for possible shingles states noticed pins and needle sensation, no itching. 2-3 days ago.Has red lines on left torso/abdomin. Denies feeling fatigue. Has not had shingle vaccines.  Past Medical History:  Diagnosis Date   Anxiety    Back pain    Bilateral swelling of feet    CAD (coronary artery disease), native coronary artery    coronary CTA 01/2022 < 25% plaque in the mid RCA, LAD and OM1 with coronary Ca score of 1.23.   Chronic combined systolic and diastolic CHF (congestive heart failure) (HCC) 10/07/2014   COPD (chronic obstructive pulmonary disease) (HCC)    DCM (dilated cardiomyopathy) (HCC) 04/25/2014   EF 28% by MRI despite maximum medical therapy   Depression    Diabetes mellitus without complication (HCC)    Epilepsy (HCC)    as a child   Fatty liver    Former tobacco use    Hyperlipidemia    Hypertension    Hypothyroidism    Joint pain    Leg swelling    Morbid obesity (HCC)    NICM (nonischemic cardiomyopathy) (HCC)    OSA (obstructive sleep apnea)    moderate with AHI 26/hr with oxygen desaturations as low as 70%   Scoliosis    Sleep apnea    SOB (shortness of breath)     Past Surgical History:  Procedure Laterality Date   ABDOMINAL HYSTERECTOMY     ankle artery involved cyst removal     CARDIAC CATHETERIZATION  05/14/2014   normal coronary arteries   CARPAL TUNNEL RELEASE     FASCIOTOMY     1993 for platar fasciitis   harrington rod scoliosis     1981   LEFT HEART CATHETERIZATION WITH CORONARY ANGIOGRAM N/A 05/14/2014   Procedure: LEFT HEART CATHETERIZATION WITH CORONARY ANGIOGRAM;  Surgeon: Iran Ouch, MD;  Location: [facility]ATH [facility];  Service: Cardiovascular;  Laterality: N/A;   ROTATOR CUFF REPAIR Right 06/2021   UMBILICAL HERNIA REPAIR      VESICOVAGINAL FISTULA CLOSURE W/ TAH      Family History  Problem Relation Age of Onset   Diabetes Mother    Hyperlipidemia Mother    Obesity Mother    Emphysema Father    Allergies Father    Heart failure Father    Heart disease Father 44       MI   Diabetes Father    Hyperlipidemia Father    Hypertension Father    Thyroid disease Father    Alcohol abuse Father    Obesity Father    Breast cancer Neg Hx     Social History   Socioeconomic History   Marital status: Married    Spouse name: Not on file   Number of children: Not on file   Years of education: Not on file   Highest education level: Not on file  Occupational History   Occupation: disabled  Tobacco Use   Smoking status: Former    Packs/day: 1.00    Years: 20.00    Additional pack years: 0.00    Total pack years: 20.00    Types: Cigarettes    Quit date: 02/14/2005    Years since quitting: 17.4   Smokeless tobacco: Never  Vaping Use   Vaping  Use: Never used  Substance and Sexual Activity   Alcohol use: Yes    Alcohol/week: 2.0 standard drinks of alcohol    Types: 2 Shots of liquor per week    Comment: socially   Drug use: No   Sexual activity: Not Currently  Other Topics Concern   Not on file  Social History Narrative   Lives in [place] with spouse and son.   Previously worked as a Comptroller         Social Determinants of Corporate investment banker Strain: High Risk (06/23/2022)   Overall Financial Resource Strain (CARDIA)    Difficulty of Paying Living Expenses: Very hard  Food Insecurity: No Food Insecurity (04/15/2022)   Hunger Vital Sign    Worried About Running Out of Food in the Last Year: Never true    Ran Out of Food in the Last Year: Never true  Transportation Needs: No Transportation Needs (04/15/2022)   PRAPARE - Administrator, Civil Service (Medical): No    Lack of Transportation (Non-Medical): No  Physical Activity: Inactive (04/15/2022)   Exercise  Vital Sign    Days of Exercise per Week: 0 days    Minutes of Exercise per Session: 0 min  Stress: No Stress Concern Present (04/15/2022)   [employer] of Occupational Health - Occupational Stress Questionnaire    Feeling of Stress : Not at all  Social Connections: Moderately Isolated (04/15/2022)   Social Connection and Isolation Panel [NHANES]    Frequency of Communication with Friends and Family: More than three times a week    Frequency of Social Gatherings with Friends and Family: More than three times a week    Attends Religious Services: Never    Database administrator or Organizations: No    Attends Banker Meetings: Never    Marital Status: Married  Catering manager Violence: Not At Risk (04/15/2022)   Humiliation, Afraid, Rape, and Kick questionnaire    Fear of Current or Ex-Partner: No    Emotionally Abused: No    Physically Abused: No    Sexually Abused: No    Outpatient Medications Prior to Visit  Medication Sig Dispense Refill   albuterol (VENTOLIN HFA) 108 (90 Base) MCG/ACT inhaler Inhale 2 puffs into the lungs every 6 (six) hours as needed for wheezing or shortness of breath. 8 g 2   aspirin 81 MG tablet Take 81 mg by mouth at bedtime.      atorvastatin (LIPITOR) 20 MG tablet TAKE 1 TABLET EVERY DAY 90 tablet 3   buPROPion (WELLBUTRIN XL) 300 MG 24 hr tablet TAKE 1 TABLET EVERY DAY 90 tablet 3   carvedilol (COREG) 6.25 MG tablet TAKE 1 TABLET TWICE DAILY 180 tablet 3   clonazePAM (KLONOPIN) 0.5 MG tablet Take 1 tablet (0.5 mg total) by mouth 2 (two) times daily as needed for anxiety (panic attacks). 90 tablet 0   Coenzyme Q10 (CO Q 10) 100 MG CAPS Take 200 mg by mouth daily.     diphenhydrAMINE (BENADRYL) 25 MG tablet Take 50 mg by mouth at bedtime as needed.     Dulaglutide (TRULICITY) 3 MG/0.5ML SOPN Inject 3 mg as directed once a week. 2 mL 0   fluticasone (FLONASE) 50 MCG/ACT nasal spray SPRAY 2 SPRAYS INTO EACH NOSTRIL EVERY DAY 48 mL 2   gabapentin  (NEURONTIN) 800 MG tablet TAKE 1 TABLET THREE TIMES DAILY 270 tablet 3   ipratropium-albuterol (DUONEB) 0.5-2.5 (3) MG/3ML SOLN Take  3 mLs by nebulization every 4 (four) hours as needed. 120 mL 0   levothyroxine (SYNTHROID) 75 MCG tablet TAKE 1 TABLET (75 MCG TOTAL) BY MOUTH DAILY. 90 tablet 3   Lutein 20 MG TABS Take 1 tablet by mouth daily.     meloxicam (MOBIC) 15 MG tablet Take 1 tablet (15 mg total) by mouth daily. 90 tablet 3   Multiple Vitamin (MULTIVITAMIN WITH MINERALS) TABS tablet Take 1 tablet by mouth daily.     omeprazole (PRILOSEC) 20 MG capsule Take 20 mg by mouth daily.     OXYGEN 2lpm 2 lpm as needed     sacubitril-valsartan (ENTRESTO) 24-26 MG Take 1 tablet by mouth 2 (two) times daily. 180 tablet 3   Tiotropium Bromide-Olodaterol (STIOLTO RESPIMAT) 2.5-2.5 MCG/ACT AERS INHALE 2 PUFFS INTO THE LUNGS DAILY. 12 g 5   tiZANidine (ZANAFLEX) 4 MG tablet TAKE 1 TABLET EVERY 6 HOURS AS NEEDED FOR MUSCLE SPASM(S) 360 tablet 3   traMADol (ULTRAM) 50 MG tablet Take 1 tablet (50 mg total) by mouth every 6 (six) hours as needed for severe pain. Take one four times a day as needed for pain 360 tablet 0   UNABLE TO FIND CPAP with o2 2lpm  DME- [facility]     VASCEPA 1 g capsule TAKE 2 CAPSULES BY MOUTH TWICE A DAY 360 capsule 1   Vitamin D, Ergocalciferol, (DRISDOL) 1.25 MG (50000 UNIT) CAPS capsule TAKE 1 CAPSULE EVERY 7 DAYS 12 capsule 3   FARXIGA 10 MG TABS tablet Take 1 tablet (10 mg total) by mouth daily. 90 tablet 3   No facility-administered medications prior to visit.    Allergies  Allergen Reactions   Meperidine Nausea And Vomiting and Other (See Comments)    Review of Systems  Constitutional:  Negative for chills, fatigue and fever.  Respiratory:  Negative for cough and shortness of breath.   Gastrointestinal:  Positive for abdominal pain. Negative for constipation, diarrhea, nausea and vomiting.  Musculoskeletal:  Negative for back pain and myalgias.  Skin:  Positive for rash  (left side of abdomin).       Objective:        07/20/2022    3:29 PM 04/15/2022   10:05 AM 04/05/2022    3:07 PM  Vitals with BMI  Height 5\' 5"  5\' 5"  5\' 5"   Weight 266 lbs 272 lbs 272 lbs 13 oz  BMI 44.26 45.26 45.4  Systolic 124 124 540  Diastolic 68 74 78  Pulse 81 67 76    No data found.   Physical Exam Vitals reviewed.  Constitutional:      Appearance: Normal appearance. She is obese.  Skin:    Findings: Rash (pinpoint petechiae in small area of LUQ. Unable to elicit pain with light touch. tender over 1 cm region left side of abdomen. difficult to fined and with somewhat deep palpation.) present.  Neurological:     Mental Status: She is alert.     Health Maintenance Due  Topic Date Due   DEXA SCAN  Never done   Diabetic kidney evaluation - Urine ACR  Never done   Hepatitis C Screening  Never done   Zoster Vaccines- Shingrix (1 of 2) Never done   OPHTHALMOLOGY EXAM  03/01/2022    There are no preventive care reminders to display for this patient.   Lab Results  Component Value Date   TSH 1.400 12/13/2021   Lab Results  Component Value Date   WBC 6.6 03/21/2022  HGB 14.6 03/21/2022   HCT 43.3 03/21/2022   MCV 92 03/21/2022   PLT 248 03/21/2022   Lab Results  Component Value Date   NA 145 (H) 03/21/2022   K 4.5 03/21/2022   CO2 23 03/21/2022   GLUCOSE 94 03/21/2022   BUN 15 03/21/2022   CREATININE 0.70 03/21/2022   BILITOT 1.0 03/21/2022   ALKPHOS 176 (H) 03/21/2022   AST 21 03/21/2022   ALT 31 03/21/2022   PROT 6.2 03/21/2022   ALBUMIN 4.3 03/21/2022   CALCIUM 9.4 03/21/2022   ANIONGAP 11 02/27/2021   EGFR 98 03/21/2022   GFR 100.87 10/01/2014   Lab Results  Component Value Date   CHOL 135 03/21/2022   Lab Results  Component Value Date   HDL 38 (L) 03/21/2022   Lab Results  Component Value Date   LDLCALC 67 03/21/2022   Lab Results  Component Value Date   TRIG 181 (H) 03/21/2022   Lab Results  Component Value Date   CHOLHDL  3.6 03/21/2022   Lab Results  Component Value Date   HGBA1C 5.9 (H) 03/21/2022       Assessment & Plan:  Rash Assessment & Plan: Unlikely to be shingles unless early shingles and it has been present for 5 days. If worsens, patient to call and will send valtrex and possibly a steroid taper.       No orders of the defined types were placed in this encounter.   No orders of the defined types were placed in this encounter.    Follow-up: No follow-ups on file.  An After Visit Summary was printed and given to the patient.  Blane Ohara, MD [facility] [phone]

## 2022-07-25 ENCOUNTER — Telehealth: Payer: Self-pay

## 2022-07-25 NOTE — Progress Notes (Cosign Needed)
Patient returned [facility] cares application for [place] to PCP office. Patient portions completed, retrieving PCP signature and will fax to [facility] cares.    Billee Cashing, CMA Clinical Pharmacist Assistant [phone]

## 2022-07-26 ENCOUNTER — Encounter: Payer: Self-pay | Admitting: Family Medicine

## 2022-07-26 ENCOUNTER — Other Ambulatory Visit: Payer: Self-pay

## 2022-07-26 DIAGNOSIS — R21 Rash and other nonspecific skin eruption: Secondary | ICD-10-CM | POA: Insufficient documentation

## 2022-07-26 MED ORDER — FARXIGA 10 MG PO TABS: 10.0000 mg | ORAL_TABLET | Freq: Every day | ORAL | 0 refills | Status: DC

## 2022-07-26 NOTE — Assessment & Plan Note (Signed)
Unlikely to be shingles unless early shingles and it has been present for 5 days. If worsens, patient to call and will send valtrex and possibly a steroid taper.

## 2022-08-03 ENCOUNTER — Telehealth: Payer: Self-pay

## 2022-08-03 NOTE — Telephone Encounter (Signed)
Patient assistance application faxed to [facility] for [place]

## 2022-08-20 ENCOUNTER — Other Ambulatory Visit: Payer: Self-pay

## 2022-08-20 DIAGNOSIS — I5022 Chronic systolic (congestive) heart failure: Secondary | ICD-10-CM

## 2022-08-20 DIAGNOSIS — E038 Other specified hypothyroidism: Secondary | ICD-10-CM

## 2022-08-20 DIAGNOSIS — E1169 Type 2 diabetes mellitus with other specified complication: Secondary | ICD-10-CM

## 2022-08-20 DIAGNOSIS — E782 Mixed hyperlipidemia: Secondary | ICD-10-CM

## 2022-08-26 ENCOUNTER — Other Ambulatory Visit: Payer: Medicare HMO

## 2022-08-26 DIAGNOSIS — E782 Mixed hyperlipidemia: Secondary | ICD-10-CM

## 2022-08-26 DIAGNOSIS — E038 Other specified hypothyroidism: Secondary | ICD-10-CM

## 2022-08-26 DIAGNOSIS — E1169 Type 2 diabetes mellitus with other specified complication: Secondary | ICD-10-CM | POA: Diagnosis not present

## 2022-08-26 DIAGNOSIS — I110 Hypertensive heart disease with heart failure: Secondary | ICD-10-CM | POA: Diagnosis not present

## 2022-08-26 DIAGNOSIS — E785 Hyperlipidemia, unspecified: Secondary | ICD-10-CM | POA: Diagnosis not present

## 2022-08-26 DIAGNOSIS — I5022 Chronic systolic (congestive) heart failure: Secondary | ICD-10-CM | POA: Diagnosis not present

## 2022-08-26 DIAGNOSIS — R5383 Other fatigue: Secondary | ICD-10-CM | POA: Diagnosis not present

## 2022-08-27 LAB — COMPREHENSIVE METABOLIC PANEL
ALT: 30 IU/L (ref 0–32)
AST: 24 IU/L (ref 0–40)
Albumin: 4 g/dL (ref 3.9–4.9)
Alkaline Phosphatase: 170 IU/L — ABNORMAL HIGH (ref 44–121)
BUN/Creatinine Ratio: 22 (ref 12–28)
BUN: 16 mg/dL (ref 8–27)
Bilirubin Total: 0.8 mg/dL (ref 0.0–1.2)
CO2: 24 mmol/L (ref 20–29)
Calcium: 9.8 mg/dL (ref 8.7–10.3)
Chloride: 104 mmol/L (ref 96–106)
Creatinine, Ser: 0.74 mg/dL (ref 0.57–1.00)
Globulin, Total: 1.9 g/dL (ref 1.5–4.5)
Glucose: 96 mg/dL (ref 70–99)
Potassium: 4.7 mmol/L (ref 3.5–5.2)
Sodium: 145 mmol/L — ABNORMAL HIGH (ref 134–144)
Total Protein: 5.9 g/dL — ABNORMAL LOW (ref 6.0–8.5)
eGFR: 91 mL/min/{1.73_m2} (ref >59)

## 2022-08-27 LAB — CBC WITH DIFFERENTIAL/PLATELET
Basophils Absolute: 0.1 10*3/uL (ref 0.0–0.2)
Basos: 1 %
EOS (ABSOLUTE): 0.4 10*3/uL (ref 0.0–0.4)
Eos: 5 %
Hematocrit: 43.8 % (ref 34.0–46.6)
Hemoglobin: 14.7 g/dL (ref 11.1–15.9)
Immature Grans (Abs): 0 10*3/uL (ref 0.0–0.1)
Immature Granulocytes: 0 %
Lymphocytes Absolute: 1.5 10*3/uL (ref 0.7–3.1)
Lymphs: 21 %
MCH: 31.3 pg (ref 26.6–33.0)
MCHC: 33.6 g/dL (ref 31.5–35.7)
MCV: 93 fL (ref 79–97)
Monocytes Absolute: 0.5 10*3/uL (ref 0.1–0.9)
Monocytes: 6 %
Neutrophils Absolute: 4.8 10*3/uL (ref 1.4–7.0)
Neutrophils: 67 %
Platelets: 263 10*3/uL (ref 150–450)
RBC: 4.7 x10E6/uL (ref 3.77–5.28)
RDW: 12.4 % (ref 11.7–15.4)
WBC: 7.1 10*3/uL (ref 3.4–10.8)

## 2022-08-27 LAB — LIPID PANEL
Chol/HDL Ratio: 3.2 ratio (ref 0.0–4.4)
Cholesterol, Total: 131 mg/dL (ref 100–199)
HDL: 41 mg/dL (ref >39)
LDL Chol Calc (NIH): 64 mg/dL (ref 0–99)
Triglycerides: 148 mg/dL (ref 0–149)
VLDL Cholesterol Cal: 26 mg/dL (ref 5–40)

## 2022-08-27 LAB — MICROALBUMIN / CREATININE URINE RATIO
Creatinine, Urine: 38.3 mg/dL
Microalb/Creat Ratio: <8 mg/g creat (ref 0–29)
Microalbumin, Urine: <3 ug/mL

## 2022-08-27 LAB — HEMOGLOBIN A1C
Est. average glucose Bld gHb Est-mCnc: 117 mg/dL
Hgb A1c MFr Bld: 5.7 % — ABNORMAL HIGH (ref 4.8–5.6)

## 2022-08-27 LAB — TSH: TSH: 1.55 u[IU]/mL (ref 0.450–4.500)

## 2022-08-30 ENCOUNTER — Ambulatory Visit (INDEPENDENT_AMBULATORY_CARE_PROVIDER_SITE_OTHER): Payer: Medicare HMO | Admitting: Family Medicine

## 2022-08-30 VITALS — BP 118/72 | HR 99 | Temp 97.0°F | Ht 65.0 in | Wt 264.0 lb

## 2022-08-30 DIAGNOSIS — E782 Mixed hyperlipidemia: Secondary | ICD-10-CM

## 2022-08-30 DIAGNOSIS — G4733 Obstructive sleep apnea (adult) (pediatric): Secondary | ICD-10-CM

## 2022-08-30 DIAGNOSIS — E1169 Type 2 diabetes mellitus with other specified complication: Secondary | ICD-10-CM | POA: Diagnosis not present

## 2022-08-30 DIAGNOSIS — E038 Other specified hypothyroidism: Secondary | ICD-10-CM | POA: Diagnosis not present

## 2022-08-30 DIAGNOSIS — I110 Hypertensive heart disease with heart failure: Secondary | ICD-10-CM

## 2022-08-30 DIAGNOSIS — F330 Major depressive disorder, recurrent, mild: Secondary | ICD-10-CM | POA: Diagnosis not present

## 2022-08-30 DIAGNOSIS — K219 Gastro-esophageal reflux disease without esophagitis: Secondary | ICD-10-CM | POA: Diagnosis not present

## 2022-08-30 DIAGNOSIS — R5383 Other fatigue: Secondary | ICD-10-CM | POA: Insufficient documentation

## 2022-08-30 DIAGNOSIS — E785 Hyperlipidemia, unspecified: Secondary | ICD-10-CM

## 2022-08-30 DIAGNOSIS — J410 Simple chronic bronchitis: Secondary | ICD-10-CM

## 2022-08-30 DIAGNOSIS — Z7984 Long term (current) use of oral hypoglycemic drugs: Secondary | ICD-10-CM

## 2022-08-30 DIAGNOSIS — I5022 Chronic systolic (congestive) heart failure: Secondary | ICD-10-CM

## 2022-08-30 MED ORDER — ALBUTEROL SULFATE HFA 108 (90 BASE) MCG/ACT IN AERS: 2.0000 | INHALATION_SPRAY | Freq: Four times a day (QID) | RESPIRATORY_TRACT | 2 refills | Status: DC | PRN

## 2022-08-30 MED ORDER — OMEPRAZOLE 20 MG PO CPDR: 20.0000 mg | DELAYED_RELEASE_CAPSULE | Freq: Every day | ORAL | 3 refills | Status: AC

## 2022-08-30 NOTE — Assessment & Plan Note (Signed)
Well controlled.  No changes to medicines. Continue farxiga 10 mg daily, Coreg 6.25 mg twice daily, Entresto 24-26 mg twice daily, ASA 81 mg daily Continue to work on eating a healthy diet and exercise.  Labs reviewed.

## 2022-08-30 NOTE — Assessment & Plan Note (Signed)
Well controlled.  No changes to medicines. Continue lipitor, vascepa, coenzyme Q 10 daily. Continue to work on eating a healthy diet and exercise.  Labs reviewed.

## 2022-08-30 NOTE — Assessment & Plan Note (Signed)
Previously well controlled Continue Synthroid at current dose  

## 2022-08-30 NOTE — Progress Notes (Unsigned)
Subjective:  Patient ID: Judy [name], female    DOB: 04/20/1960  Age: 62 y.o. MRN: [id number]  Chief Complaint  Patient presents with   Medical Management of Chronic Issues    HPI Diabetes:  Complications: hyperlipidemia Glucose checking: not checking Most recent A1C: 5.7 Current medications: Farxiga 10 mg once daily, Trulicity 3 mg weekly. Last Eye Exam:03/01/21 Foot checks: yes Lifestyle changes: Eating fairly healthy. Unable to exercise due to back pain.    Hyperlipidemia: Current medications: lipitor 20 mg daily, vascepa, coenzyme Q 10 daily Labs reviewed. LDL at goal. Trig 148, HDL 41,  LDL 64, TC 131   Hypertension with chf due to idiopathic cardiomyopathy. Current medications: Coreg 6.25 mg twice daily, Entresto 24-26 mg twice daily, ASA 81 mg daily   Chronic pain syndrome: lumbar back pain secondary to severe scoliosis and harrington rod complication. Taking tramadol 50 mg one to two times per week and tylenol. Has muscle relaxant. Gabapentin 800 mg THREE TIMES A DAY, Using TENS unit.   OSA/COPD: uses cpap. Compliant and benefits. On stiolto. Sees pulmonology. Continues to have shortness of breath with exertion.  Chronic Respiratory Failure with hypoxia. Wears oxygen 2 L at night and with exertion as needed daily.    Hypothyroidism -currently on synthroid 75 mcg. TSH 1.55   Depression, mild, recurrent- wellbutrin XL 300 mg once daily in a.m. clonazepam 0.5 mg 1 twice daily as needed for panic attacks.  Patient very rarely takes this.     08/30/2022    3:41 PM 04/15/2022   10:10 AM 03/24/2022    2:05 PM 03/24/2022    2:04 PM 12/16/2021    3:38 PM  Depression screen PHQ 2/9  Decreased Interest 1 0 0 0 0  Down, Depressed, Hopeless 1 0 0 0 0  PHQ - 2 Score 2 0 0 0 0  Altered sleeping 2 3 2   0  Tired, decreased energy 2 0 1  0  Change in appetite 1 0 0  0  Feeling bad or failure about yourself  0 0 0  0  Trouble concentrating  0 2  0  Moving slowly or  fidgety/restless 0 0 0  0  Suicidal thoughts 0 0 0  0  PHQ-9 Score 7 3 5   0  Difficult doing work/chores  Not difficult at all Not difficult at all          08/30/2022    3:39 PM  Fall Risk   Falls in the past year? 0  Number falls in past yr: 0  Risk for fall due to : No Fall Risks  Follow up Falls evaluation completed    Patient Care Team: Blane Ohara, MD as PCP - General (Family Medicine) Quintella Reichert, MD as PCP - Cardiology (Cardiology) Zettie Pho, [facility] (Inactive) (Pharmacist)   Review of Systems  Constitutional:  Negative for chills, fatigue and fever.  HENT:  Negative for congestion, ear pain, rhinorrhea and sore throat.   Respiratory:  Negative for cough and shortness of breath.   Cardiovascular:  Negative for chest pain.  Gastrointestinal:  Negative for abdominal pain, constipation, diarrhea, nausea and vomiting.  Genitourinary:  Negative for dysuria and urgency.  Musculoskeletal:  Positive for back pain. Negative for myalgias.  Neurological:  Negative for dizziness, weakness, light-headedness and headaches.  Psychiatric/Behavioral:  Negative for dysphoric mood. The patient is not nervous/anxious.     Current Outpatient Medications on File Prior to Visit  Medication Sig Dispense Refill  aspirin 81 MG tablet Take 81 mg by mouth at bedtime.      atorvastatin (LIPITOR) 20 MG tablet TAKE 1 TABLET EVERY DAY 90 tablet 3   buPROPion (WELLBUTRIN XL) 300 MG 24 hr tablet TAKE 1 TABLET EVERY DAY 90 tablet 3   carvedilol (COREG) 6.25 MG tablet TAKE 1 TABLET TWICE DAILY 180 tablet 3   clonazePAM (KLONOPIN) 0.5 MG tablet Take 1 tablet (0.5 mg total) by mouth 2 (two) times daily as needed for anxiety (panic attacks). 90 tablet 0   Coenzyme Q10 (CO Q 10) 100 MG CAPS Take 200 mg by mouth daily.     diphenhydrAMINE (BENADRYL) 25 MG tablet Take 50 mg by mouth at bedtime as needed.     Dulaglutide (TRULICITY) 3 MG/0.5ML SOPN Inject 3 mg as directed once a week. 2 mL 0    FARXIGA 10 MG TABS tablet Take 1 tablet (10 mg total) by mouth daily. 90 tablet 0   fluticasone (FLONASE) 50 MCG/ACT nasal spray SPRAY 2 SPRAYS INTO EACH NOSTRIL EVERY DAY 48 mL 2   gabapentin (NEURONTIN) 800 MG tablet TAKE 1 TABLET THREE TIMES DAILY 270 tablet 3   ipratropium-albuterol (DUONEB) 0.5-2.5 (3) MG/3ML SOLN Take 3 mLs by nebulization every 4 (four) hours as needed. 120 mL 0   levothyroxine (SYNTHROID) 75 MCG tablet TAKE 1 TABLET (75 MCG TOTAL) BY MOUTH DAILY. 90 tablet 3   Lutein 20 MG TABS Take 1 tablet by mouth daily.     meloxicam (MOBIC) 15 MG tablet Take 1 tablet (15 mg total) by mouth daily. 90 tablet 3   Multiple Vitamin (MULTIVITAMIN WITH MINERALS) TABS tablet Take 1 tablet by mouth daily.     OXYGEN 2lpm 2 lpm as needed     sacubitril-valsartan (ENTRESTO) 24-26 MG Take 1 tablet by mouth 2 (two) times daily. 180 tablet 3   Tiotropium Bromide-Olodaterol (STIOLTO RESPIMAT) 2.5-2.5 MCG/ACT AERS INHALE 2 PUFFS INTO THE LUNGS DAILY. 12 g 5   tiZANidine (ZANAFLEX) 4 MG tablet TAKE 1 TABLET EVERY 6 HOURS AS NEEDED FOR MUSCLE SPASM(S) 360 tablet 3   traMADol (ULTRAM) 50 MG tablet Take 1 tablet (50 mg total) by mouth every 6 (six) hours as needed for severe pain. Take one four times a day as needed for pain 360 tablet 0   UNABLE TO FIND CPAP with o2 2lpm  DME- [facility]     VASCEPA 1 g capsule TAKE 2 CAPSULES BY MOUTH TWICE A DAY 360 capsule 1   Vitamin D, Ergocalciferol, (DRISDOL) 1.25 MG (50000 UNIT) CAPS capsule TAKE 1 CAPSULE EVERY 7 DAYS 12 capsule 3   No current facility-administered medications on file prior to visit.   Past Medical History:  Diagnosis Date   Anxiety    Back pain    Bilateral swelling of feet    CAD (coronary artery disease), native coronary artery    coronary CTA 01/2022 < 25% plaque in the mid RCA, LAD and OM1 with coronary Ca score of 1.23.   Chronic combined systolic and diastolic CHF (congestive heart failure) (HCC) 10/07/2014   COPD (chronic obstructive  pulmonary disease) (HCC)    DCM (dilated cardiomyopathy) (HCC) 04/25/2014   EF 28% by MRI despite maximum medical therapy   Depression    Diabetes mellitus without complication (HCC)    Epilepsy (HCC)    as a child   Fatty liver    Former tobacco use    Hyperlipidemia    Hypertension    Hypothyroidism  Joint pain    Leg swelling    Morbid obesity (HCC)    NICM (nonischemic cardiomyopathy) (HCC)    OSA (obstructive sleep apnea)    moderate with AHI 26/hr with oxygen desaturations as low as 70%   Scoliosis    Sleep apnea    SOB (shortness of breath)    Past Surgical History:  Procedure Laterality Date   ABDOMINAL HYSTERECTOMY     ankle artery involved cyst removal     CARDIAC CATHETERIZATION  05/14/2014   normal coronary arteries   CARPAL TUNNEL RELEASE     FASCIOTOMY     1993 for platar fasciitis   harrington rod scoliosis     1981   LEFT HEART CATHETERIZATION WITH CORONARY ANGIOGRAM N/A 05/14/2014   Procedure: LEFT HEART CATHETERIZATION WITH CORONARY ANGIOGRAM;  Surgeon: Iran Ouch, MD;  Location: [facility] CATH LAB;  Service: Cardiovascular;  Laterality: N/A;   ROTATOR CUFF REPAIR Right 06/2021   UMBILICAL HERNIA REPAIR     VESICOVAGINAL FISTULA CLOSURE W/ TAH      Family History  Problem Relation Age of Onset   Diabetes Mother    Hyperlipidemia Mother    Obesity Mother    Emphysema Father    Allergies Father    Heart failure Father    Heart disease Father 16       MI   Diabetes Father    Hyperlipidemia Father    Hypertension Father    Thyroid disease Father    Alcohol abuse Father    Obesity Father    Breast cancer Neg Hx    Social History   Socioeconomic History   Marital status: Married    Spouse name: Not on file   Number of children: Not on file   Years of education: Not on file   Highest education level: Some college, no degree  Occupational History   Occupation: disabled  Tobacco Use   Smoking status: Former    Current packs/day: 0.00     Average packs/day: 1 pack/day for 20.0 years (20.0 ttl pk-yrs)    Types: Cigarettes    Start date: 02/14/1985    Quit date: 02/14/2005    Years since quitting: 17.5   Smokeless tobacco: Never  Vaping Use   Vaping status: Never Used  Substance and Sexual Activity   Alcohol use: Yes    Alcohol/week: 2.0 standard drinks of alcohol    Types: 2 Shots of liquor per week    Comment: socially   Drug use: No   Sexual activity: Not Currently  Other Topics Concern   Not on file  Social History Narrative   Lives in [place] with spouse and son.   Previously worked as a Comptroller         Social Determinants of Corporate investment banker Strain: Low Risk  (08/26/2022)   Overall Financial Resource Strain (CARDIA)    Difficulty of Paying Living Expenses: Not very hard  Recent Concern: Financial Resource Strain - High Risk (06/23/2022)   Overall Financial Resource Strain (CARDIA)    Difficulty of Paying Living Expenses: Very hard  Food Insecurity: No Food Insecurity (08/26/2022)   Hunger Vital Sign    Worried About Running Out of Food in the Last Year: Never true    Ran Out of Food in the Last Year: Never true  Transportation Needs: No Transportation Needs (08/26/2022)   PRAPARE - Administrator, Civil Service (Medical): No    Lack of Transportation (  Non-Medical): No  Physical Activity: Insufficiently Active (08/26/2022)   Exercise Vital Sign    Days of Exercise per Week: 2 days    Minutes of Exercise per Session: 20 min  Stress: No Stress Concern Present (08/26/2022)   [employer] of Occupational Health - Occupational Stress Questionnaire    Feeling of Stress : Only a little  Social Connections: Socially Isolated (08/26/2022)   Social Connection and Isolation Panel [NHANES]    Frequency of Communication with Friends and Family: Once a week    Frequency of Social Gatherings with Friends and Family: Once a week    Attends Religious Services: Never    Scientist, clinical (histocompatibility and immunogenetics): No    Attends Engineer, structural: Never    Marital Status: Married    Objective:  BP 118/72   Pulse 99   Temp (!) 97 F (36.1 C)   Ht 5\' 5"  (1.651 m)   Wt 264 lb (119.7 kg)   SpO2 93%   BMI 43.93 kg/m      08/30/2022    3:21 PM 07/20/2022    3:29 PM 04/15/2022   10:05 AM  BP/Weight  Systolic BP 118 124 124  Diastolic BP 72 68 74  Wt. (Lbs) 264 266 272  BMI 43.93 kg/m2 44.26 kg/m2 45.26 kg/m2    Physical Exam Vitals reviewed.  Constitutional:      Appearance: Normal appearance. She is obese.  Neck:     Vascular: No carotid bruit.  Cardiovascular:     Rate and Rhythm: Normal rate and regular rhythm.     Heart sounds: Normal heart sounds.  Pulmonary:     Effort: Pulmonary effort is normal. No respiratory distress.     Breath sounds: Normal breath sounds.  Abdominal:     General: Abdomen is flat. Bowel sounds are normal.     Palpations: Abdomen is soft.     Tenderness: There is no abdominal tenderness.  Neurological:     Mental Status: She is alert and oriented to person, place, and time.  Psychiatric:        Mood and Affect: Mood normal.        Behavior: Behavior normal.     Diabetic Foot Exam - Simple   Simple Foot Form Diabetic Foot exam was performed with the following findings: Yes 08/30/2022  3:39 PM  Visual Inspection No deformities, no ulcerations, no other skin breakdown bilaterally: Yes Sensation Testing Intact to touch and monofilament testing bilaterally: Yes Pulse Check Posterior Tibialis and Dorsalis pulse intact bilaterally: Yes Comments      Lab Results  Component Value Date   WBC 7.1 08/26/2022   HGB 14.7 08/26/2022   HCT 43.8 08/26/2022   PLT 263 08/26/2022   GLUCOSE 96 08/26/2022   CHOL 131 08/26/2022   TRIG 148 08/26/2022   HDL 41 08/26/2022   LDLCALC 64 08/26/2022   ALT 30 08/26/2022   AST 24 08/26/2022   NA 145 (H) 08/26/2022   K 4.7 08/26/2022   CL 104 08/26/2022   CREATININE  0.74 08/26/2022   BUN 16 08/26/2022   CO2 24 08/26/2022   TSH 1.550 08/26/2022   INR 1.0 05/13/2014   HGBA1C 5.7 (H) 08/26/2022   MICROALBUR Negative 09/03/2019      Assessment & Plan:    Hypertensive heart disease with chronic systolic congestive heart failure (HCC) Assessment & Plan: Well controlled.  No changes to medicines. Continue farxiga 10 mg daily, Coreg 6.25 mg twice daily, Entresto  24-26 mg twice daily, ASA 81 mg daily Continue to work on eating a healthy diet and exercise.  Labs reviewed.    Hyperlipidemia associated with type 2 diabetes mellitus (HCC) Assessment & Plan: Diabetes and cholesterol at goal Recommend check feet daily. Recommend annual eye exams. Medicines: Continue Farxiga 10 mg once daily, Trulicity 3 mg once daily, atorvastatin 20 mg before bed, vascepa 1 gm 2 capsules twice daily, and coenzyme q10.  Continue to work on eating a healthy diet and exercise.  Labs reviewed   Other specified hypothyroidism Assessment & Plan: Previously well controlled Continue Synthroid at current dose     Mixed hyperlipidemia Assessment & Plan: Well controlled.  No changes to medicines. Continue lipitor, vascepa, coenzyme Q 10 daily Continue to work on eating a healthy diet and exercise.  Labs reviewed    OSA (obstructive sleep apnea) Assessment & Plan: Continue cpap and 2 L oxygen.    Simple chronic bronchitis (HCC) Assessment & Plan: Continue qvar and stiolto.  Recommend use duoneb every 6 hours as needed.   Orders: -     Albuterol Sulfate HFA; Inhale 2 puffs into the lungs every 6 (six) hours as needed for wheezing or shortness of breath.  Dispense: 8 g; Refill: 2  Mild recurrent major depression (HCC) Assessment & Plan: The current medical regimen is effective;  continue present plan and medications. Continue wellbutrin XL 300 mg once daily in a.m. clonazepam 0.5 mg 1 twice daily as needed for panic attacks.   Fatigue, unspecified  type Assessment & Plan: B12 and Folate labs added on to previous blood draw.   Gastroesophageal reflux disease without esophagitis -     Omeprazole; Take 1 capsule (20 mg total) by mouth daily.  Dispense: 90 capsule; Refill: 3  Morbid obesity due to excess calories [employer]) Assessment & Plan: Recommend continue to work on eating healthy diet and exercise. Comorbidities: diabetes and hyperlipidemia.      Meds ordered this encounter  Medications   albuterol (VENTOLIN HFA) 108 (90 Base) MCG/ACT inhaler    Sig: Inhale 2 puffs into the lungs every 6 (six) hours as needed for wheezing or shortness of breath.    Dispense:  8 g    Refill:  2   omeprazole (PRILOSEC) 20 MG capsule    Sig: Take 1 capsule (20 mg total) by mouth daily.    Dispense:  90 capsule    Refill:  3    No orders of the defined types were placed in this encounter.    Follow-up: Return in about 3 months (around 11/30/2022) for chronic, fasting.   I,Katherina A Bramblett,acting as a scribe for Blane Ohara, MD.,have documented all relevant documentation on the behalf of Blane Ohara, MD,as directed by  Blane Ohara, MD while in the presence of Blane Ohara, MD.    Clayborn Bigness I Leal-Borjas,acting as a scribe for Blane Ohara, MD.,have documented all relevant documentation on the behalf of Blane Ohara, MD,as directed by  Blane Ohara, MD while in the presence of Blane Ohara, MD.   An After Visit Summary was printed and given to the patient.  Blane Ohara, MD [facility] [phone]

## 2022-08-30 NOTE — Assessment & Plan Note (Signed)
B12 and Folate labs added on to previous blood draw.

## 2022-08-30 NOTE — Assessment & Plan Note (Signed)
Continue cpap and 2 L oxygen.

## 2022-08-30 NOTE — Assessment & Plan Note (Signed)
Diabetes and cholesterol at goal Recommend check feet daily. Recommend annual eye exams. Medicines: Continue Farxiga 10 mg once daily, Trulicity 3 mg once daily, atorvastatin 20 mg before bed, vascepa 1 gm 2 capsules twice daily, and coenzyme q10.  Continue to work on eating a healthy diet and exercise.  Labs reviewed

## 2022-08-31 LAB — SPECIMEN STATUS REPORT

## 2022-08-31 LAB — B12 AND FOLATE PANEL
Folate: 18.9 ng/mL (ref >3.0)
Vitamin B-12: 1088 pg/mL (ref 232–1245)

## 2022-09-01 ENCOUNTER — Ambulatory Visit: Payer: Medicare HMO | Admitting: Family Medicine

## 2022-09-02 NOTE — Assessment & Plan Note (Signed)
The current medical regimen is effective;  continue present plan and medications. Continue wellbutrin XL 300 mg once daily in a.m. clonazepam 0.5 mg 1 twice daily as needed for panic attacks.

## 2022-09-03 ENCOUNTER — Encounter: Payer: Self-pay | Admitting: Family Medicine

## 2022-09-03 DIAGNOSIS — K219 Gastro-esophageal reflux disease without esophagitis: Secondary | ICD-10-CM | POA: Insufficient documentation

## 2022-09-03 NOTE — Assessment & Plan Note (Signed)
Continue qvar and stiolto.  Recommend use duoneb every 6 hours as needed.

## 2022-09-03 NOTE — Assessment & Plan Note (Signed)
Recommend continue to work on eating healthy diet and exercise. Comorbidities: diabetes and hyperlipidemia.  

## 2022-09-10 ENCOUNTER — Other Ambulatory Visit: Payer: Self-pay | Admitting: Family Medicine

## 2022-09-21 ENCOUNTER — Other Ambulatory Visit: Payer: Self-pay | Admitting: Family Medicine

## 2022-09-21 DIAGNOSIS — E559 Vitamin D deficiency, unspecified: Secondary | ICD-10-CM

## 2022-10-19 ENCOUNTER — Ambulatory Visit (INDEPENDENT_AMBULATORY_CARE_PROVIDER_SITE_OTHER): Payer: Medicare HMO

## 2022-10-19 DIAGNOSIS — Z23 Encounter for immunization: Secondary | ICD-10-CM

## 2022-10-24 ENCOUNTER — Ambulatory Visit (INDEPENDENT_AMBULATORY_CARE_PROVIDER_SITE_OTHER): Payer: Medicare HMO | Admitting: Family Medicine

## 2022-10-24 VITALS — BP 120/8 | HR 84 | Temp 97.1°F | Resp 16 | Ht 65.0 in | Wt 261.8 lb

## 2022-10-24 DIAGNOSIS — M7542 Impingement syndrome of left shoulder: Secondary | ICD-10-CM

## 2022-10-24 MED ORDER — IBUPROFEN 800 MG PO TABS: 800.0000 mg | ORAL_TABLET | Freq: Three times a day (TID) | ORAL | 0 refills | Status: DC | PRN

## 2022-10-24 NOTE — Progress Notes (Unsigned)
Acute Office Visit  Subjective:    Patient ID: Judy [name], female    DOB: [birth year], 62 y.o.   MRN: [id number]  Chief Complaint  Patient presents with   Left shoulder pain    HPI: Patient is in today for Left shoulder pain. Patient states it has been going on for several weeks.  The pain seems to be getting worse and she is unable to sleep.   Patient is taking tramadol three times a day. Has not tried nsaid.   Past Medical History:  Diagnosis Date   Anxiety    Back pain    Bilateral swelling of feet    CAD (coronary artery disease), native coronary artery    coronary CTA 01/2022 < 25% plaque in the mid RCA, LAD and OM1 with coronary Ca score of 1.23.   Chronic combined systolic and diastolic CHF (congestive heart failure) (HCC) 10/07/2014   COPD (chronic obstructive pulmonary disease) (HCC)    DCM (dilated cardiomyopathy) (HCC) 04/25/2014   EF 28% by MRI despite maximum medical therapy   Depression    Diabetes mellitus without complication (HCC)    Epilepsy (HCC)    as a child   Fatty liver    Former tobacco use    Hyperlipidemia    Hypertension    Hypothyroidism    Joint pain    Leg swelling    Morbid obesity (HCC)    NICM (nonischemic cardiomyopathy) (HCC)    OSA (obstructive sleep apnea)    moderate with AHI 26/hr with oxygen desaturations as low as 70%   Scoliosis    Sleep apnea    SOB (shortness of breath)     Past Surgical History:  Procedure Laterality Date   ABDOMINAL HYSTERECTOMY     ankle artery involved cyst removal     CARDIAC CATHETERIZATION  05/14/2014   normal coronary arteries   CARPAL TUNNEL RELEASE     FASCIOTOMY     1993 for platar fasciitis   harrington rod scoliosis     1981   LEFT HEART CATHETERIZATION WITH CORONARY ANGIOGRAM N/A 05/14/2014   Procedure: LEFT HEART CATHETERIZATION WITH CORONARY ANGIOGRAM;  Surgeon: Iran Ouch, MD;  Location: [facility];  Service: Cardiovascular;  Laterality: N/A;   ROTATOR CUFF REPAIR  Right 06/2021   UMBILICAL HERNIA REPAIR     VESICOVAGINAL FISTULA CLOSURE W/ TAH      Family History  Problem Relation Age of Onset   Diabetes Mother    Hyperlipidemia Mother    Obesity Mother    Emphysema Father    Allergies Father    Heart failure Father    Heart disease Father 31       MI   Diabetes Father    Hyperlipidemia Father    Hypertension Father    Thyroid disease Father    Alcohol abuse Father    Obesity Father    Breast cancer Neg Hx     Social History   Socioeconomic History   Marital status: Married    Spouse name: Not on file   Number of children: Not on file   Years of education: Not on file   Highest education level: Some college, no degree  Occupational History   Occupation: disabled  Tobacco Use   Smoking status: Former    Current packs/day: 0.00    Average packs/day: 1 pack/day for 20.0 years (20.0 ttl pk-yrs)    Types: Cigarettes    Start date: 02/14/1985    Quit  date: 02/14/2005    Years since quitting: 17.7   Smokeless tobacco: Never  Vaping Use   Vaping status: Never Used  Substance and Sexual Activity   Alcohol use: Yes    Alcohol/week: 2.0 standard drinks of alcohol    Types: 2 Shots of liquor per week    Comment: socially   Drug use: No   Sexual activity: Not Currently  Other Topics Concern   Not on file  Social History Narrative   Lives in [place] with spouse and son.   Previously worked as a Comptroller         Social Determinants of Corporate investment banker Strain: Low Risk  (08/26/2022)   Overall Financial Resource Strain (CARDIA)    Difficulty of Paying Living Expenses: Not very hard  Recent Concern: Financial Resource Strain - High Risk (06/23/2022)   Overall Financial Resource Strain (CARDIA)    Difficulty of Paying Living Expenses: Very hard  Food Insecurity: No Food Insecurity (08/26/2022)   Hunger Vital Sign    Worried About Running Out of Food in the Last Year: Never true    Ran Out of Food in  the Last Year: Never true  Transportation Needs: No Transportation Needs (08/26/2022)   PRAPARE - Administrator, Civil Service (Medical): No    Lack of Transportation (Non-Medical): No  Physical Activity: Insufficiently Active (08/26/2022)   Exercise Vital Sign    Days of Exercise per Week: 2 days    Minutes of Exercise per Session: 20 min  Stress: No Stress Concern Present (08/26/2022)   [employer] of Occupational Health - Occupational Stress Questionnaire    Feeling of Stress : Only a little  Social Connections: Socially Isolated (08/26/2022)   Social Connection and Isolation Panel [NHANES]    Frequency of Communication with Friends and Family: Once a week    Frequency of Social Gatherings with Friends and Family: Once a week    Attends Religious Services: Never    Database administrator or Organizations: No    Attends Banker Meetings: Never    Marital Status: Married  Catering manager Violence: Not At Risk (04/15/2022)   Humiliation, Afraid, Rape, and Kick questionnaire    Fear of Current or Ex-Partner: No    Emotionally Abused: No    Physically Abused: No    Sexually Abused: No    Outpatient Medications Prior to Visit  Medication Sig Dispense Refill   albuterol (VENTOLIN HFA) 108 (90 Base) MCG/ACT inhaler Inhale 2 puffs into the lungs every 6 (six) hours as needed for wheezing or shortness of breath. 8 g 2   aspirin 81 MG tablet Take 81 mg by mouth at bedtime.      atorvastatin (LIPITOR) 20 MG tablet TAKE 1 TABLET EVERY DAY 90 tablet 3   buPROPion (WELLBUTRIN XL) 300 MG 24 hr tablet TAKE 1 TABLET EVERY DAY 90 tablet 3   carvedilol (COREG) 6.25 MG tablet TAKE 1 TABLET TWICE DAILY 180 tablet 3   clonazePAM (KLONOPIN) 0.5 MG tablet Take 1 tablet (0.5 mg total) by mouth 2 (two) times daily as needed for anxiety (panic attacks). 90 tablet 0   Coenzyme Q10 (CO Q 10) 100 MG CAPS Take 200 mg by mouth daily.     diphenhydrAMINE (BENADRYL) 25 MG tablet Take  50 mg by mouth at bedtime as needed.     Dulaglutide (TRULICITY) 3 MG/0.5ML SOPN Inject 3 mg as directed once a week. 2 mL 0  FARXIGA 10 MG TABS tablet Take 1 tablet (10 mg total) by mouth daily. 90 tablet 0   fluticasone (FLONASE) 50 MCG/ACT nasal spray SPRAY 2 SPRAYS INTO EACH NOSTRIL EVERY DAY 48 mL 2   gabapentin (NEURONTIN) 800 MG tablet TAKE 1 TABLET THREE TIMES DAILY 270 tablet 3   ipratropium-albuterol (DUONEB) 0.5-2.5 (3) MG/3ML SOLN Take 3 mLs by nebulization every 4 (four) hours as needed. 120 mL 0   levothyroxine (SYNTHROID) 75 MCG tablet TAKE 1 TABLET EVERY DAY 90 tablet 1   Lutein 20 MG TABS Take 1 tablet by mouth daily.     meloxicam (MOBIC) 15 MG tablet Take 1 tablet (15 mg total) by mouth daily. 90 tablet 3   Multiple Vitamin (MULTIVITAMIN WITH MINERALS) TABS tablet Take 1 tablet by mouth daily.     omeprazole (PRILOSEC) 20 MG capsule Take 1 capsule (20 mg total) by mouth daily. 90 capsule 3   OXYGEN 2lpm 2 lpm as needed     sacubitril-valsartan (ENTRESTO) 24-26 MG Take 1 tablet by mouth 2 (two) times daily. 180 tablet 3   Tiotropium Bromide-Olodaterol (STIOLTO RESPIMAT) 2.5-2.5 MCG/ACT AERS INHALE 2 PUFFS INTO THE LUNGS DAILY. 12 g 5   tiZANidine (ZANAFLEX) 4 MG tablet TAKE 1 TABLET EVERY 6 HOURS AS NEEDED FOR MUSCLE SPASM(S) 360 tablet 3   traMADol (ULTRAM) 50 MG tablet Take 1 tablet (50 mg total) by mouth every 6 (six) hours as needed for severe pain. Take one four times a day as needed for pain 360 tablet 0   UNABLE TO FIND CPAP with o2 2lpm  DME- [facility]     VASCEPA 1 g capsule TAKE 2 CAPSULES BY MOUTH TWICE A DAY 360 capsule 1   Vitamin D, Ergocalciferol, (DRISDOL) 1.25 MG (50000 UNIT) CAPS capsule TAKE 1 CAPSULE EVERY 7 DAYS 12 capsule 3   No facility-administered medications prior to visit.    Allergies  Allergen Reactions   Meperidine Nausea And Vomiting and Other (See Comments)    Review of Systems  Constitutional:  Negative for appetite change, fatigue and  fever.  HENT:  Negative for congestion, ear pain, sinus pressure and sore throat.   Respiratory:  Negative for cough, chest tightness, shortness of breath and wheezing.   Cardiovascular:  Negative for chest pain and palpitations.  Gastrointestinal:  Negative for abdominal pain, constipation, diarrhea, nausea and vomiting.  Genitourinary:  Negative for dysuria and hematuria.  Musculoskeletal:  Positive for arthralgias (left shoulder vaccine) and myalgias (Right shoulder pain). Negative for back pain and joint swelling.  Skin:  Negative for rash.  Neurological:  Negative for dizziness, weakness and headaches.  Psychiatric/Behavioral:  Negative for dysphoric mood. The patient is not nervous/anxious.        Objective:        10/24/2022    2:45 PM 08/30/2022    3:21 PM 07/20/2022    3:29 PM  Vitals with BMI  Height 5\' 5"  5\' 5"  5\' 5"   Weight 261 lbs 13 oz 264 lbs 266 lbs  BMI 43.57 43.93 44.26  Systolic 120 118 454  Diastolic 8 72 68  Pulse 84 99 81    No data found.   Physical Exam Vitals reviewed.  Constitutional:      Appearance: Normal appearance. She is obese.  Musculoskeletal:     Comments: LEFT SHOULDER EXAM TENDER: anteriorly, superiorly, and posteriorly. Tender left thoracic paraspinal muscles.  FROM NORMAL, but causes obvious discomfort with abduction and external rotation.  INTERNAL ROTATION: normal EMPTY CAN  SIGN: Positive  Neurological:     Mental Status: She is alert.   Joint Injection/Arthrocentesis  Date/Time: 10/24/2022 3:24 PM  Performed by: Blane Ohara, MD Authorized by: Blane Ohara, MD  Indications: pain  Body area: shoulder Joint: left shoulder Local anesthesia used: yes  Anesthesia: Local anesthesia used: yes Local Anesthetic: topical anesthetic  Sedation: Patient sedated: no  Needle size: 22 G Ultrasound guidance: no Approach: posterior Triamcinolone amount: 40 mg Lidocaine 1% amount: 5 mL Patient tolerance: patient tolerated the  procedure well with no immediate complications    Discussed risks and benefits of above procedure an patient agreed to proceed. These included bleeding, infection, increased pain, increased sugars.   Health Maintenance Due  Topic Date Due   DEXA SCAN  Never done   Hepatitis C Screening  Never done   Zoster Vaccines- Shingrix (1 of 2) Never done   OPHTHALMOLOGY EXAM  03/01/2022    There are no preventive care reminders to display for this patient.   Lab Results  Component Value Date   TSH 1.550 08/26/2022   Lab Results  Component Value Date   WBC 7.1 08/26/2022   HGB 14.7 08/26/2022   HCT 43.8 08/26/2022   MCV 93 08/26/2022   PLT 263 08/26/2022   Lab Results  Component Value Date   NA 145 (H) 08/26/2022   K 4.7 08/26/2022   CO2 24 08/26/2022   GLUCOSE 96 08/26/2022   BUN 16 08/26/2022   CREATININE 0.74 08/26/2022   BILITOT 0.8 08/26/2022   ALKPHOS 170 (H) 08/26/2022   AST 24 08/26/2022   ALT 30 08/26/2022   PROT 5.9 (L) 08/26/2022   ALBUMIN 4.0 08/26/2022   CALCIUM 9.8 08/26/2022   ANIONGAP 11 02/27/2021   EGFR 91 08/26/2022   GFR 100.87 10/01/2014   Lab Results  Component Value Date   CHOL 131 08/26/2022   Lab Results  Component Value Date   HDL 41 08/26/2022   Lab Results  Component Value Date   LDLCALC 64 08/26/2022   Lab Results  Component Value Date   TRIG 148 08/26/2022   Lab Results  Component Value Date   CHOLHDL 3.2 08/26/2022   Lab Results  Component Value Date   HGBA1C 5.7 (H) 08/26/2022       Assessment & Plan:  Shoulder impingement syndrome, left -     DG Shoulder Left; Future -     Arthrocentesis  Other orders -     Ibuprofen; Take 1 tablet (800 mg total) by mouth every 8 (eight) hours as needed.  Dispense: 42 tablet; Refill: 0     Meds ordered this encounter  Medications   ibuprofen (ADVIL) 800 MG tablet    Sig: Take 1 tablet (800 mg total) by mouth every 8 (eight) hours as needed.    Dispense:  42 tablet    Refill:   0    Orders Placed This Encounter  Procedures   Joint Injection/Arthrocentesis   DG Shoulder Left     Follow-up: No follow-ups on file.  An After Visit Summary was printed and given to the patient.    I,Lauren M Auman,acting as a scribe for Blane Ohara, MD.,have documented all relevant documentation on the behalf of Blane Ohara, MD,as directed by  Blane Ohara, MD while in the presence of Blane Ohara, MD.    Clayborn Bigness I Leal-Borjas,acting as a scribe for Blane Ohara, MD.,have documented all relevant documentation on the behalf of Blane Ohara, MD,as directed by  Blane Ohara, MD  while in the presence of Blane Ohara, MD.    Blane Ohara, MD [facility] [phone]

## 2022-10-26 ENCOUNTER — Encounter: Payer: Self-pay | Admitting: Family Medicine

## 2022-10-29 NOTE — Assessment & Plan Note (Addendum)
Start on ibuprofen 800 mg three times a day as needed.  Shoulder injection given.  Left shoulder xray ordered.

## 2022-10-30 ENCOUNTER — Encounter (HOSPITAL_BASED_OUTPATIENT_CLINIC_OR_DEPARTMENT_OTHER): Payer: Self-pay

## 2022-10-30 ENCOUNTER — Encounter: Payer: Self-pay | Admitting: Family Medicine

## 2022-10-30 ENCOUNTER — Emergency Department (HOSPITAL_BASED_OUTPATIENT_CLINIC_OR_DEPARTMENT_OTHER): Payer: Medicare HMO | Admitting: Radiology

## 2022-10-30 ENCOUNTER — Emergency Department (HOSPITAL_BASED_OUTPATIENT_CLINIC_OR_DEPARTMENT_OTHER): Payer: Medicare HMO

## 2022-10-30 ENCOUNTER — Emergency Department (HOSPITAL_BASED_OUTPATIENT_CLINIC_OR_DEPARTMENT_OTHER)
Admission: EM | Admit: 2022-10-30 | Discharge: 2022-10-30 | Disposition: A | Discharge status: Home / Self Care | Payer: Medicare HMO

## 2022-10-30 ENCOUNTER — Other Ambulatory Visit: Payer: Self-pay

## 2022-10-30 DIAGNOSIS — M19012 Primary osteoarthritis, left shoulder: Secondary | ICD-10-CM | POA: Diagnosis not present

## 2022-10-30 DIAGNOSIS — R0789 Other chest pain: Secondary | ICD-10-CM | POA: Diagnosis not present

## 2022-10-30 DIAGNOSIS — R079 Chest pain, unspecified: Secondary | ICD-10-CM | POA: Diagnosis not present

## 2022-10-30 DIAGNOSIS — Z7982 Long term (current) use of aspirin: Secondary | ICD-10-CM | POA: Diagnosis not present

## 2022-10-30 DIAGNOSIS — M25512 Pain in left shoulder: Secondary | ICD-10-CM | POA: Diagnosis not present

## 2022-10-30 DIAGNOSIS — R918 Other nonspecific abnormal finding of lung field: Secondary | ICD-10-CM | POA: Diagnosis not present

## 2022-10-30 LAB — BASIC METABOLIC PANEL
Anion gap: 9 (ref 5–15)
BUN: 20 mg/dL (ref 8–23)
CO2: 27 mmol/L (ref 22–32)
Calcium: 9.2 mg/dL (ref 8.9–10.3)
Chloride: 105 mmol/L (ref 98–111)
Creatinine, Ser: 0.72 mg/dL (ref 0.44–1.00)
GFR, Estimated: >60 mL/min (ref >60)
Glucose, Bld: 108 mg/dL — ABNORMAL HIGH (ref 70–99)
Potassium: 3.9 mmol/L (ref 3.5–5.1)
Sodium: 141 mmol/L (ref 135–145)

## 2022-10-30 LAB — CBC
HCT: 50.4 % — ABNORMAL HIGH (ref 36.0–46.0)
Hemoglobin: 16.6 g/dL — ABNORMAL HIGH (ref 12.0–15.0)
MCH: 31 pg (ref 26.0–34.0)
MCHC: 32.9 g/dL (ref 30.0–36.0)
MCV: 94 fL (ref 80.0–100.0)
Platelets: 256 10*3/uL (ref 150–400)
RBC: 5.36 MIL/uL — ABNORMAL HIGH (ref 3.87–5.11)
RDW: 13.1 % (ref 11.5–15.5)
WBC: 9.8 10*3/uL (ref 4.0–10.5)
nRBC: 0 % (ref 0.0–0.2)

## 2022-10-30 LAB — TROPONIN I (HIGH SENSITIVITY): Troponin I (High Sensitivity): 4 ng/L (ref <18)

## 2022-10-30 MED ORDER — OXYCODONE-ACETAMINOPHEN 5-325 MG PO TABS
1.0000 | ORAL_TABLET | Freq: Four times a day (QID) | ORAL | 0 refills | Status: DC | PRN
Start: 2022-10-30 — End: 2022-11-01

## 2022-10-30 MED ORDER — ACETAMINOPHEN 325 MG PO TABS
650.0000 mg | ORAL_TABLET | Freq: Once | ORAL | Status: AC
  Administered 2022-10-30: 650 mg via ORAL
  Filled 2022-10-30: qty 2

## 2022-10-30 MED ORDER — METHOCARBAMOL 500 MG PO TABS
1000.0000 mg | ORAL_TABLET | Freq: Once | ORAL | Status: AC
  Administered 2022-10-30: 1000 mg via ORAL
  Filled 2022-10-30: qty 2

## 2022-10-30 MED ORDER — LIDOCAINE 5 % EX PTCH
1.0000 | MEDICATED_PATCH | Freq: Once | CUTANEOUS | Status: DC
  Administered 2022-10-30: 1 via TRANSDERMAL
  Filled 2022-10-30: qty 1

## 2022-10-30 MED ORDER — GABAPENTIN 300 MG PO CAPS
300.0000 mg | ORAL_CAPSULE | Freq: Once | ORAL | Status: AC
  Administered 2022-10-30: 300 mg via ORAL
  Filled 2022-10-30: qty 1

## 2022-10-30 MED ORDER — METHOCARBAMOL 500 MG PO TABS: 500.0000 mg | ORAL_TABLET | Freq: Two times a day (BID) | ORAL | 0 refills | Status: AC

## 2022-10-30 MED ORDER — OXYCODONE-ACETAMINOPHEN 5-325 MG PO TABS
1.0000 | ORAL_TABLET | Freq: Once | ORAL | Status: AC
  Administered 2022-10-30: 1 via ORAL
  Filled 2022-10-30: qty 1

## 2022-10-30 NOTE — ED Notes (Signed)
Provided pt with pain medications; family at bedside and call light in reach.

## 2022-10-30 NOTE — ED Provider Notes (Signed)
[place] EMERGENCY DEPARTMENT AT [facility] Provider Note   CSN: [id number] Arrival date & time: 10/30/22  1115     History  Chief Complaint  Patient presents with   Shoulder Pain    left   Chest Pain    EMEREY YUEH is a 62 y.o. female.  Is a 62 year old female presenting emergency department with chief complaint of left shoulder pain.  Reports she has some baseline pain in the shoulder, seemingly worse over the past week.  Had an injection earlier this week that did not improve her symptoms.  She has continued to have pain.  She has Ortho appointment tomorrow but did not think she could make it secondary to pain.  Denies any weakness in the arm.  No fevers no chills.   Shoulder Pain Chest Pain      Home Medications Prior to Admission medications   Medication Sig Start Date End Date Taking? Authorizing Provider  methocarbamol (ROBAXIN) 500 MG tablet Take 1 tablet (500 mg total) by mouth 2 (two) times daily for 7 days. 10/30/22 11/06/22 Yes Josha Weekley, Harmon Dun, DO  oxyCODONE-acetaminophen (PERCOCET/ROXICET) 5-325 MG tablet Take 1 tablet by mouth every 6 (six) hours as needed for severe pain. 10/30/22  Yes Coral Spikes, DO  albuterol (VENTOLIN HFA) 108 (90 Base) MCG/ACT inhaler Inhale 2 puffs into the lungs every 6 (six) hours as needed for wheezing or shortness of breath. 08/30/22   Blane Ohara, MD  aspirin 81 MG tablet Take 81 mg by mouth at bedtime.     [provider]  atorvastatin (LIPITOR) 20 MG tablet TAKE 1 TABLET EVERY DAY 01/23/22   Marianne Sofia, PA-C  buPROPion (WELLBUTRIN XL) 300 MG 24 hr tablet TAKE 1 TABLET EVERY DAY 05/29/22   Cox, Fritzi Mandes, MD  carvedilol (COREG) 6.25 MG tablet TAKE 1 TABLET TWICE DAILY 06/21/22   Quintella Reichert, MD  clonazePAM (KLONOPIN) 0.5 MG tablet Take 1 tablet (0.5 mg total) by mouth 2 (two) times daily as needed for anxiety (panic attacks). 04/07/22   Cox, Fritzi Mandes, MD  Coenzyme Q10 (CO Q 10) 100 MG CAPS Take 200 mg by mouth  daily.    [provider]  diphenhydrAMINE (BENADRYL) 25 MG tablet Take 50 mg by mouth at bedtime as needed.    [provider]  Dulaglutide (TRULICITY) 3 MG/0.5ML SOPN Inject 3 mg as directed once a week. 04/08/20   Cox, Fritzi Mandes, MD  FARXIGA 10 MG TABS tablet Take 1 tablet (10 mg total) by mouth daily. 07/26/22   Cox, Fritzi Mandes, MD  fluticasone [facility]) 50 MCG/ACT nasal spray SPRAY 2 SPRAYS INTO EACH NOSTRIL EVERY DAY 10/28/21   Cox, Fritzi Mandes, MD  gabapentin (NEURONTIN) 800 MG tablet TAKE 1 TABLET THREE TIMES DAILY 01/08/22   Cox, Fritzi Mandes, MD  ibuprofen (ADVIL) 800 MG tablet Take 1 tablet (800 mg total) by mouth every 8 (eight) hours as needed. 10/24/22   Cox, Fritzi Mandes, MD  ipratropium-albuterol (DUONEB) 0.5-2.5 (3) MG/3ML SOLN Take 3 mLs by nebulization every 4 (four) hours as needed. 10/11/21   Cox, Fritzi Mandes, MD  levothyroxine (SYNTHROID) 75 MCG tablet TAKE 1 TABLET EVERY DAY 09/11/22   Cox, Kirsten, MD  Lutein 20 MG TABS Take 1 tablet by mouth daily.    [provider]  meloxicam (MOBIC) 15 MG tablet Take 1 tablet (15 mg total) by mouth daily. 12/16/21   CoxFritzi Mandes, MD  Multiple Vitamin (MULTIVITAMIN WITH MINERALS) TABS tablet Take 1 tablet by mouth daily.  [provider]  omeprazole (PRILOSEC) 20 MG capsule Take 1 capsule (20 mg total) by mouth daily. 08/30/22   Cox, Fritzi Mandes, MD  OXYGEN 2lpm 2 lpm as needed    [provider]  sacubitril-valsartan (ENTRESTO) 24-26 MG Take 1 tablet by mouth 2 (two) times daily. 05/26/21   Cox, Fritzi Mandes, MD  Tiotropium Bromide-Olodaterol (STIOLTO RESPIMAT) 2.5-2.5 MCG/ACT AERS INHALE 2 PUFFS INTO THE LUNGS DAILY. 03/18/20   Nyoka Cowden, MD  tiZANidine (ZANAFLEX) 4 MG tablet TAKE 1 TABLET EVERY 6 HOURS AS NEEDED FOR MUSCLE SPASM(S) 01/08/22   Cox, Fritzi Mandes, MD  traMADol (ULTRAM) 50 MG tablet Take 1 tablet (50 mg total) by mouth every 6 (six) hours as needed for severe pain. Take one four times a day as needed for pain 08/11/21    Cox, Fritzi Mandes, MD  UNABLE TO FIND CPAP with o2 2lpm  DME- [facility]    [provider]  VASCEPA 1 g capsule TAKE 2 CAPSULES BY MOUTH TWICE A DAY 05/03/22   Cox, Kirsten, MD  Vitamin D, Ergocalciferol, (DRISDOL) 1.25 MG (50000 UNIT) CAPS capsule TAKE 1 CAPSULE EVERY 7 DAYS 09/21/22   Cox, Fritzi Mandes, MD      Allergies    Meperidine    Review of Systems   Review of Systems  Cardiovascular:  Positive for chest pain.    Physical Exam Updated Vital Signs BP (!) 140/90 (BP Location: Right Arm)   Pulse 76   Temp 98.7 F (37.1 C) (Oral)   Resp 16   Ht 5\' 5"  (1.651 m)   Wt 118 kg   SpO2 94%   BMI 43.29 kg/m  Physical Exam Vitals and nursing note reviewed.  Constitutional:      General: She is not in acute distress.    Appearance: She is not toxic-appearing.  HENT:     Head: Normocephalic.  Cardiovascular:     Rate and Rhythm: Normal rate and regular rhythm.     Heart sounds: Normal heart sounds.  Pulmonary:     Effort: Pulmonary effort is normal.  Musculoskeletal:     Comments: Left shoulder with no erythema or warmth.  She does have some tenderness along the scapular border.  Pain exacerbated by passive range of motion.  She has 5 out of 5 bicep today transplanting for grip strength.  Normal sensation.  2+ radial pulses.  Skin:    General: Skin is warm.     Capillary Refill: Capillary refill takes less than 2 seconds.  Neurological:     Mental Status: She is alert.     ED Results / Procedures / Treatments   Labs (all labs ordered are listed, but only abnormal results are displayed) Labs Reviewed  BASIC METABOLIC PANEL - Abnormal; Notable for the following components:      Result Value   Glucose, Bld 108 (*)    All other components within normal limits  CBC - Abnormal; Notable for the following components:   RBC 5.36 (*)    Hemoglobin 16.6 (*)    HCT 50.4 (*)    All other components within normal limits  TROPONIN I (HIGH SENSITIVITY)    EKG EKG  Interpretation Date/Time:  Sunday October 30 2022 11:32:30 EDT Ventricular Rate:  78 PR Interval:  155 QRS Duration:  104 QT Interval:  381 QTC Calculation: 434 R Axis:   69  Text Interpretation: Sinus rhythm Low voltage, precordial leads ST elevation, consider inferior injury Confirmed by Estanislado Pandy [phone]) on 10/30/2022 12:19:33 PM  Radiology DG Shoulder Left  Result Date: 10/30/2022 CLINICAL DATA:  Left shoulder pain.  No known injury. EXAM: LEFT SHOULDER - 2+ VIEW COMPARISON:  None Available. FINDINGS: There is no sign of acute fracture or subluxation. Mild degenerative changes involving the glenohumeral and acromioclavicular joint. Soft tissues are unremarkable. IMPRESSION: 1. No acute findings. 2. Mild osteoarthritis. Electronically Signed   By: Signa Kell M.D.   On: 10/30/2022 13:01   DG Chest 2 View  Result Date: 10/30/2022 CLINICAL DATA:  Chest pain with left shoulder/arm pain 4 days. EXAM: CHEST - 2 VIEW COMPARISON:  06/07/2021 FINDINGS: Lungs are adequately inflated without lobar consolidation or effusion. Subtle linear atelectasis/scarring over the right base. Cardiomediastinal silhouette and remainder of the exam is unchanged. IMPRESSION: No acute cardiopulmonary disease. Subtle linear atelectasis/scarring over the right base. Electronically Signed   By: Elberta Fortis M.D.   On: 10/30/2022 11:57    Procedures Procedures    Medications Ordered in ED Medications  lidocaine (LIDODERM) 5 % 1 patch (1 patch Transdermal Patch Applied 10/30/22 1216)  acetaminophen (TYLENOL) tablet 650 mg (650 mg Oral Given 10/30/22 1223)  oxyCODONE-acetaminophen (PERCOCET/ROXICET) 5-325 MG per tablet 1 tablet (1 tablet Oral Given 10/30/22 1214)  methocarbamol (ROBAXIN) tablet 1,000 mg (1,000 mg Oral Given 10/30/22 1214)  gabapentin (NEURONTIN) capsule 300 mg (300 mg Oral Given 10/30/22 1214)    ED Course/ Medical Decision Making/ A&P Clinical Course as of 10/30/22 1441  Sun Oct 30, 2022   1313 DG Shoulder Left IMPRESSION: 1. No acute findings. 2. Mild osteoarthritis.   [TY]  1338 Workup today with no significant leukocytosis to suggest systemic infection.  Basic metabolic without electrolyte abnormalities.  Normal kidney function.  Troponin negative.  EKG without ST segment changes indicate ischemia.  Chest x-ray without pneumonia pneumothorax.  Left shoulder x-ray without acute fracture or dislocation.  Patient treated supportively with multimodal pain with improvement of her symptoms.  Feels okay to be discharged.  She has follow-up with Ortho tomorrow.  Will discharge with a few doses of Percocet until she is able to see Ortho.  Will also give Robaxin stable for discharge at this time.. [TY]    Clinical Course User Index [TY] Coral Spikes, DO                                 Medical Decision Making 62 year old female presenting emergency department for left shoulder pain also having some radiation to her left chest.  Pain seemingly ongoing for the past week, worsened over the past few days.  She is afebrile vital signs reassuring.  Physical exam with hypertonic musculature along scapular border, no redness warmth or swelling to the joint to suggest septic joint/infection.  She was having some chest pain will get chest pain workup in addition to left shoulder x-ray.  Suspect musculoskeletal etiology.  Will treat with multimodal pain medications.  See ED course for reevaluation and final disposition.  Amount and/or Complexity of Data Reviewed Independent Historian: spouse    Details: Spouse reports patient with chronic pain External Data Reviewed: notes.    Details: Seen by primary doctor on 9 9 diagnosis shoulder impingement and underwent arthrocentesis Labs: ordered. Decision-making details documented in ED Course. Radiology: ordered. Decision-making details documented in ED Course.  Risk OTC drugs. Prescription drug management. Parenteral controlled  substances. Decision regarding hospitalization. Risk Details: Likely MSK etiology; likely benefit from acute hospitalization and has appropriate follow-up with  Ortho tomorrow.  Given her pain level will discharge with short course of narcotics.          Final Clinical Impression(s) / ED Diagnoses Final diagnoses:  Acute pain of left shoulder    Rx / DC Orders ED Discharge Orders          Ordered    oxyCODONE-acetaminophen (PERCOCET/ROXICET) 5-325 MG tablet  Every 6 hours PRN        10/30/22 1345    methocarbamol (ROBAXIN) 500 MG tablet  2 times daily        10/30/22 1345              Coral Spikes, DO 10/30/22 1441

## 2022-10-30 NOTE — Discharge Instructions (Addendum)
Follow-up with your Ortho doctor tomorrow as planned.  Return if you develop fevers, chills, worsening chest pain, shortness of breath, severe pain or any new or worsening symptoms that are concerning to you.

## 2022-10-30 NOTE — ED Triage Notes (Addendum)
Patient arrives with complaints of worsening left shoulder/arm pain x4 days. Also reports some chest wall pain. Patient took Tizandine, Tramadol, and Ibuprofen prior to arrival with no relief. Rates pain a 8/10.  Also having left hand pain related to touching vines.

## 2022-10-31 DIAGNOSIS — M7502 Adhesive capsulitis of left shoulder: Secondary | ICD-10-CM | POA: Diagnosis not present

## 2022-11-01 ENCOUNTER — Telehealth: Payer: Self-pay

## 2022-11-01 ENCOUNTER — Other Ambulatory Visit: Payer: Self-pay | Admitting: Family Medicine

## 2022-11-01 MED ORDER — OXYCODONE-ACETAMINOPHEN 5-325 MG PO TABS: 1.0000 | ORAL_TABLET | Freq: Four times a day (QID) | ORAL | 0 refills | Status: DC | PRN

## 2022-11-01 NOTE — Telephone Encounter (Signed)
Patient husband called and stated she is still having pain with her left arm, stated she is about out of pain medication and wanted to know can she have a refill or do they need to make an appointment. Please advise

## 2022-11-02 ENCOUNTER — Other Ambulatory Visit: Payer: Self-pay | Admitting: Family Medicine

## 2022-11-02 ENCOUNTER — Ambulatory Visit (INDEPENDENT_AMBULATORY_CARE_PROVIDER_SITE_OTHER): Payer: Medicare HMO | Admitting: Family Medicine

## 2022-11-02 ENCOUNTER — Encounter: Payer: Self-pay | Admitting: Family Medicine

## 2022-11-02 VITALS — BP 110/80 | HR 77 | Temp 97.1°F | Resp 12 | Wt 250.0 lb

## 2022-11-02 DIAGNOSIS — R21 Rash and other nonspecific skin eruption: Secondary | ICD-10-CM | POA: Diagnosis not present

## 2022-11-02 DIAGNOSIS — M7542 Impingement syndrome of left shoulder: Secondary | ICD-10-CM

## 2022-11-02 MED ORDER — OXYCODONE-ACETAMINOPHEN 5-325 MG PO TABS: 1.0000 | ORAL_TABLET | Freq: Four times a day (QID) | ORAL | 0 refills | Status: DC | PRN

## 2022-11-02 MED ORDER — VALACYCLOVIR HCL 500 MG PO TABS: 500.0000 mg | ORAL_TABLET | Freq: Three times a day (TID) | ORAL | 0 refills | Status: DC

## 2022-11-02 MED ORDER — PREDNISONE 20 MG PO TABS
ORAL_TABLET | ORAL | 0 refills | Status: DC
Start: 2022-11-02 — End: 2022-11-02

## 2022-11-02 MED ORDER — PREDNISONE 20 MG PO TABS: ORAL_TABLET | ORAL | 0 refills | Status: AC

## 2022-11-02 NOTE — Assessment & Plan Note (Signed)
Continue physical therapy

## 2022-11-02 NOTE — Assessment & Plan Note (Signed)
I am concerned about the possibility of shingles overlapping and causing some pain.  Ordered prednisone taper, valtrex, and refilled percocet.  Patient is already on max dose of gabapentin.

## 2022-11-02 NOTE — Progress Notes (Signed)
Acute Office Visit  Subjective:    Patient ID: Judy [name], female    DOB: February 18, 1960, 62 y.o.   MRN: [id number]  Chief Complaint  Patient presents with   Acute Visit    HPI: Patient is in today for persistent severe left shoulder pain.  This has been going on for approximately 1 week. Patient noticed she was having some blisters on her left hand.  She did pull some plants this weekend is concerned she may have been exposed to some poisonous plant. Her hand is blistery and very painful and itchy. Her shoulder hurts but in  between the shoulder and hand is not painful. She has some small lesions on posterior elbow.   Past Medical History:  Diagnosis Date   Anxiety    Back pain    Bilateral swelling of feet    CAD (coronary artery disease), native coronary artery    coronary CTA 01/2022 < 25% plaque in the mid RCA, LAD and OM1 with coronary Ca score of 1.23.   Chronic combined systolic and diastolic CHF (congestive heart failure) (HCC) 10/07/2014   COPD (chronic obstructive pulmonary disease) (HCC)    DCM (dilated cardiomyopathy) (HCC) 04/25/2014   EF 28% by MRI despite maximum medical therapy   Depression    Diabetes mellitus without complication (HCC)    Epilepsy (HCC)    as a child   Fatty liver    Former tobacco use    Hyperlipidemia    Hypertension    Hypothyroidism    Joint pain    Leg swelling    Morbid obesity (HCC)    NICM (nonischemic cardiomyopathy) (HCC)    OSA (obstructive sleep apnea)    moderate with AHI 26/hr with oxygen desaturations as low as 70%   Scoliosis    Sleep apnea    SOB (shortness of breath)     Past Surgical History:  Procedure Laterality Date   ABDOMINAL HYSTERECTOMY     ankle artery involved cyst removal     CARDIAC CATHETERIZATION  05/14/2014   normal coronary arteries   CARPAL TUNNEL RELEASE     FASCIOTOMY     1993 for platar fasciitis   harrington rod scoliosis     1981   LEFT HEART CATHETERIZATION WITH CORONARY ANGIOGRAM  N/A 05/14/2014   Procedure: LEFT HEART CATHETERIZATION WITH CORONARY ANGIOGRAM;  Surgeon: Iran Ouch, MD;  Location: [facility];  Service: Cardiovascular;  Laterality: N/A;   ROTATOR CUFF REPAIR Right 06/2021   UMBILICAL HERNIA REPAIR     VESICOVAGINAL FISTULA CLOSURE W/ TAH      Family History  Problem Relation Age of Onset   Diabetes Mother    Hyperlipidemia Mother    Obesity Mother    Emphysema Father    Allergies Father    Heart failure Father    Heart disease Father 33       MI   Diabetes Father    Hyperlipidemia Father    Hypertension Father    Thyroid disease Father    Alcohol abuse Father    Obesity Father    Breast cancer Neg Hx     Social History   Socioeconomic History   Marital status: Married    Spouse name: Not on file   Number of children: Not on file   Years of education: Not on file   Highest education level: Some college, no degree  Occupational History   Occupation: disabled  Tobacco Use   Smoking status: Former  Current packs/day: 0.00    Average packs/day: 1 pack/day for 20.0 years (20.0 ttl pk-yrs)    Types: Cigarettes    Start date: 02/14/1985    Quit date: 02/14/2005    Years since quitting: 17.7   Smokeless tobacco: Never  Vaping Use   Vaping status: Never Used  Substance and Sexual Activity   Alcohol use: Yes    Alcohol/week: 2.0 standard drinks of alcohol    Types: 2 Shots of liquor per week    Comment: socially   Drug use: No   Sexual activity: Not Currently  Other Topics Concern   Not on file  Social History Narrative   Lives in [place] with spouse and son.   Previously worked as a Comptroller         Social Determinants of Corporate investment banker Strain: Low Risk  (08/26/2022)   Overall Financial Resource Strain (CARDIA)    Difficulty of Paying Living Expenses: Not very hard  Recent Concern: Financial Resource Strain - High Risk (06/23/2022)   Overall Financial Resource Strain (CARDIA)     Difficulty of Paying Living Expenses: Very hard  Food Insecurity: No Food Insecurity (08/26/2022)   Hunger Vital Sign    Worried About Running Out of Food in the Last Year: Never true    Ran Out of Food in the Last Year: Never true  Transportation Needs: No Transportation Needs (08/26/2022)   PRAPARE - Administrator, Civil Service (Medical): No    Lack of Transportation (Non-Medical): No  Physical Activity: Insufficiently Active (08/26/2022)   Exercise Vital Sign    Days of Exercise per Week: 2 days    Minutes of Exercise per Session: 20 min  Stress: No Stress Concern Present (08/26/2022)   [employer] of Occupational Health - Occupational Stress Questionnaire    Feeling of Stress : Only a little  Social Connections: Socially Isolated (08/26/2022)   Social Connection and Isolation Panel [NHANES]    Frequency of Communication with Friends and Family: Once a week    Frequency of Social Gatherings with Friends and Family: Once a week    Attends Religious Services: Never    Database administrator or Organizations: No    Attends Banker Meetings: Never    Marital Status: Married  Catering manager Violence: Not At Risk (04/15/2022)   Humiliation, Afraid, Rape, and Kick questionnaire    Fear of Current or Ex-Partner: No    Emotionally Abused: No    Physically Abused: No    Sexually Abused: No    Outpatient Medications Prior to Visit  Medication Sig Dispense Refill   albuterol (VENTOLIN HFA) 108 (90 Base) MCG/ACT inhaler Inhale 2 puffs into the lungs every 6 (six) hours as needed for wheezing or shortness of breath. 8 g 2   aspirin 81 MG tablet Take 81 mg by mouth at bedtime.      atorvastatin (LIPITOR) 20 MG tablet TAKE 1 TABLET EVERY DAY 90 tablet 3   buPROPion (WELLBUTRIN XL) 300 MG 24 hr tablet TAKE 1 TABLET EVERY DAY 90 tablet 3   carvedilol (COREG) 6.25 MG tablet TAKE 1 TABLET TWICE DAILY 180 tablet 3   clonazePAM (KLONOPIN) 0.5 MG tablet Take 1 tablet  (0.5 mg total) by mouth 2 (two) times daily as needed for anxiety (panic attacks). 90 tablet 0   Coenzyme Q10 (CO Q 10) 100 MG CAPS Take 200 mg by mouth daily.     diphenhydrAMINE (BENADRYL) 25  MG tablet Take 50 mg by mouth at bedtime as needed.     Dulaglutide (TRULICITY) 3 MG/0.5ML SOPN Inject 3 mg as directed once a week. 2 mL 0   FARXIGA 10 MG TABS tablet Take 1 tablet (10 mg total) by mouth daily. 90 tablet 0   fluticasone (FLONASE) 50 MCG/ACT nasal spray SPRAY 2 SPRAYS INTO EACH NOSTRIL EVERY DAY 48 mL 2   gabapentin (NEURONTIN) 800 MG tablet TAKE 1 TABLET THREE TIMES DAILY 270 tablet 3   ibuprofen (ADVIL) 800 MG tablet Take 1 tablet (800 mg total) by mouth every 8 (eight) hours as needed. 42 tablet 0   ipratropium-albuterol (DUONEB) 0.5-2.5 (3) MG/3ML SOLN Take 3 mLs by nebulization every 4 (four) hours as needed. 120 mL 0   levothyroxine (SYNTHROID) 75 MCG tablet TAKE 1 TABLET EVERY DAY 90 tablet 1   Lutein 20 MG TABS Take 1 tablet by mouth daily.     meloxicam (MOBIC) 15 MG tablet Take 1 tablet (15 mg total) by mouth daily. 90 tablet 3   methocarbamol (ROBAXIN) 500 MG tablet Take 1 tablet (500 mg total) by mouth 2 (two) times daily for 7 days. 14 tablet 0   Multiple Vitamin (MULTIVITAMIN WITH MINERALS) TABS tablet Take 1 tablet by mouth daily.     omeprazole (PRILOSEC) 20 MG capsule Take 1 capsule (20 mg total) by mouth daily. 90 capsule 3   OXYGEN 2lpm 2 lpm as needed     sacubitril-valsartan (ENTRESTO) 24-26 MG Take 1 tablet by mouth 2 (two) times daily. 180 tablet 3   Tiotropium Bromide-Olodaterol (STIOLTO RESPIMAT) 2.5-2.5 MCG/ACT AERS INHALE 2 PUFFS INTO THE LUNGS DAILY. 12 g 5   tiZANidine (ZANAFLEX) 4 MG tablet TAKE 1 TABLET EVERY 6 HOURS AS NEEDED FOR MUSCLE SPASM(S) 360 tablet 3   traMADol (ULTRAM) 50 MG tablet Take 1 tablet (50 mg total) by mouth every 6 (six) hours as needed for severe pain. Take one four times a day as needed for pain 360 tablet 0   UNABLE TO FIND CPAP with  o2 2lpm  DME- [facility]     VASCEPA 1 g capsule TAKE 2 CAPSULES BY MOUTH TWICE A DAY 360 capsule 1   Vitamin D, Ergocalciferol, (DRISDOL) 1.25 MG (50000 UNIT) CAPS capsule TAKE 1 CAPSULE EVERY 7 DAYS 12 capsule 3   oxyCODONE-acetaminophen (PERCOCET/ROXICET) 5-325 MG tablet Take 1 tablet by mouth every 6 (six) hours as needed for severe pain. 20 tablet 0   No facility-administered medications prior to visit.    Allergies  Allergen Reactions   Meperidine Nausea And Vomiting and Other (See Comments)    Review of Systems     Objective:        11/02/2022    3:18 PM 10/30/2022    1:45 PM 10/30/2022   11:30 AM  Vitals with BMI  Height   5\' 5"   Weight 250 lbs  260 lbs 2 oz  BMI 41.6  43.29  Systolic 110 140   Diastolic 80 90   Pulse 77 76     No data found.   Physical Exam Vitals reviewed.  Constitutional:      Appearance: Normal appearance. She is obese.  Cardiovascular:     Rate and Rhythm: Normal rate and regular rhythm.     Heart sounds: Normal heart sounds.  Pulmonary:     Effort: Pulmonary effort is normal.     Breath sounds: Normal breath sounds.  Musculoskeletal:        General: Tenderness (  shoulder. Limited abduction due to severe pain.) present.  Skin:    Findings: Rash (blisters on left hand. erytematous macules on lateral posterior elbow. these are not tender.) present.  Neurological:     Mental Status: She is alert and oriented to person, place, and time.  Psychiatric:        Mood and Affect: Mood normal.        Behavior: Behavior normal.          Health Maintenance Due  Topic Date Due   DEXA SCAN  Never done   Hepatitis C Screening  Never done   Zoster Vaccines- Shingrix (1 of 2) Never done   OPHTHALMOLOGY EXAM  03/01/2022    There are no preventive care reminders to display for this patient.   Lab Results  Component Value Date   TSH 1.550 08/26/2022   Lab Results  Component Value Date   WBC 9.8 10/30/2022   HGB 16.6 (H) 10/30/2022   HCT  50.4 (H) 10/30/2022   MCV 94.0 10/30/2022   PLT 256 10/30/2022   Lab Results  Component Value Date   NA 141 10/30/2022   K 3.9 10/30/2022   CO2 27 10/30/2022   GLUCOSE 108 (H) 10/30/2022   BUN 20 10/30/2022   CREATININE 0.72 10/30/2022   BILITOT 0.8 08/26/2022   ALKPHOS 170 (H) 08/26/2022   AST 24 08/26/2022   ALT 30 08/26/2022   PROT 5.9 (L) 08/26/2022   ALBUMIN 4.0 08/26/2022   CALCIUM 9.2 10/30/2022   ANIONGAP 9 10/30/2022   EGFR 91 08/26/2022   GFR 100.87 10/01/2014   Lab Results  Component Value Date   CHOL 131 08/26/2022   Lab Results  Component Value Date   HDL 41 08/26/2022   Lab Results  Component Value Date   LDLCALC 64 08/26/2022   Lab Results  Component Value Date   TRIG 148 08/26/2022   Lab Results  Component Value Date   CHOLHDL 3.2 08/26/2022   Lab Results  Component Value Date   HGBA1C 5.7 (H) 08/26/2022       Assessment & Plan:  Shoulder impingement syndrome, left Assessment & Plan: Continue physical therapy.     Rash of hand Assessment & Plan: I am concerned about the possibility of shingles overlapping and causing some pain.  Ordered prednisone taper, valtrex, and refilled percocet.  Patient is already on max dose of gabapentin.   Other orders -     valACYclovir HCl; Take 1 tablet (500 mg total) by mouth 3 (three) times daily.  Dispense: 21 tablet; Refill: 0 -     predniSONE; Take 3 tablets (60 mg total) by mouth daily with breakfast for 3 days, THEN 2 tablets (40 mg total) daily with breakfast for 3 days, THEN 1 tablet (20 mg total) daily with breakfast for 3 days.  Dispense: 18 tablet; Refill: 0 -     oxyCODONE-Acetaminophen; Take 1 tablet by mouth every 6 (six) hours as needed for severe pain.  Dispense: 20 tablet; Refill: 0     Meds ordered this encounter  Medications   valACYclovir (VALTREX) 500 MG tablet    Sig: Take 1 tablet (500 mg total) by mouth 3 (three) times daily.    Dispense:  21 tablet    Refill:  0    predniSONE (DELTASONE) 20 MG tablet    Sig: Take 3 tablets (60 mg total) by mouth daily with breakfast for 3 days, THEN 2 tablets (40 mg total) daily with breakfast for 3  days, THEN 1 tablet (20 mg total) daily with breakfast for 3 days.    Dispense:  18 tablet    Refill:  0   oxyCODONE-acetaminophen (PERCOCET/ROXICET) 5-325 MG tablet    Sig: Take 1 tablet by mouth every 6 (six) hours as needed for severe pain.    Dispense:  20 tablet    Refill:  0    No orders of the defined types were placed in this encounter.    Follow-up: Return if symptoms worsen or fail to improve.  An After Visit Summary was printed and given to the patient.  Blane Ohara, MD [facility] [phone]

## 2022-11-06 ENCOUNTER — Other Ambulatory Visit: Payer: Self-pay | Admitting: Family Medicine

## 2022-11-06 DIAGNOSIS — M542 Cervicalgia: Secondary | ICD-10-CM

## 2022-11-09 ENCOUNTER — Other Ambulatory Visit: Payer: Self-pay | Admitting: Physician Assistant

## 2022-11-10 ENCOUNTER — Other Ambulatory Visit: Payer: Self-pay | Admitting: Family Medicine

## 2022-11-10 DIAGNOSIS — M7542 Impingement syndrome of left shoulder: Secondary | ICD-10-CM

## 2022-11-20 ENCOUNTER — Other Ambulatory Visit: Payer: Self-pay | Admitting: Family Medicine

## 2022-11-20 DIAGNOSIS — E7849 Other hyperlipidemia: Secondary | ICD-10-CM

## 2022-11-22 ENCOUNTER — Ambulatory Visit (INDEPENDENT_AMBULATORY_CARE_PROVIDER_SITE_OTHER): Payer: Medicare HMO | Admitting: Physician Assistant

## 2022-11-22 ENCOUNTER — Encounter: Payer: Self-pay | Admitting: Physician Assistant

## 2022-11-22 VITALS — BP 110/68 | HR 99 | Temp 97.2°F | Ht 65.0 in | Wt 255.0 lb

## 2022-11-22 DIAGNOSIS — N342 Other urethritis: Secondary | ICD-10-CM | POA: Diagnosis not present

## 2022-11-22 DIAGNOSIS — R300 Dysuria: Secondary | ICD-10-CM | POA: Diagnosis not present

## 2022-11-22 LAB — POCT URINALYSIS DIP (CLINITEK)
Bilirubin, UA: NEGATIVE
Blood, UA: NEGATIVE
Glucose, UA: 100 mg/dL — AB
Ketones, POC UA: NEGATIVE mg/dL
Leukocytes, UA: NEGATIVE
Nitrite, UA: NEGATIVE
POC PROTEIN,UA: NEGATIVE
Spec Grav, UA: <=1.005 — AB (ref 1.010–1.025)
Urobilinogen, UA: 0.2 U/dL
pH, UA: 6 (ref 5.0–8.0)

## 2022-11-22 MED ORDER — DOXYCYCLINE HYCLATE 100 MG PO TABS
100.0000 mg | ORAL_TABLET | Freq: Two times a day (BID) | ORAL | 0 refills | Status: DC
Start: 2022-11-22 — End: 2022-12-08

## 2022-11-22 MED ORDER — FLUCONAZOLE 150 MG PO TABS: 150.0000 mg | ORAL_TABLET | Freq: Every day | ORAL | 1 refills | Status: DC

## 2022-11-22 NOTE — Progress Notes (Signed)
Acute Office Visit  Subjective:    Patient ID: Judy [name], female    DOB: [birth year], 62 y.o.   MRN: [id number]  Chief Complaint  Patient presents with   Dysuria    HPI: Patient is in today for complaints of dysuria and trouble with stream - feels like she needs to go and then only dribbles.  States started last week - did try AZO which helped Denies hematuria - has not had vaginal discharge but has had some minimal external itching Pt states she does drink a lot of carbonated drinks - not a lot of water   Current Outpatient Medications:    albuterol (VENTOLIN HFA) 108 (90 Base) MCG/ACT inhaler, Inhale 2 puffs into the lungs every 6 (six) hours as needed for wheezing or shortness of breath., Disp: 8 g, Rfl: 2   aspirin 81 MG tablet, Take 81 mg by mouth at bedtime. , Disp: , Rfl:    atorvastatin (LIPITOR) 20 MG tablet, TAKE 1 TABLET EVERY DAY, Disp: 90 tablet, Rfl: 3   buPROPion (WELLBUTRIN XL) 300 MG 24 hr tablet, TAKE 1 TABLET EVERY DAY, Disp: 90 tablet, Rfl: 3   carvedilol (COREG) 6.25 MG tablet, TAKE 1 TABLET TWICE DAILY, Disp: 180 tablet, Rfl: 3   clonazePAM (KLONOPIN) 0.5 MG tablet, Take 1 tablet (0.5 mg total) by mouth 2 (two) times daily as needed for anxiety (panic attacks)., Disp: 90 tablet, Rfl: 0   Coenzyme Q10 (CO Q 10) 100 MG CAPS, Take 200 mg by mouth daily., Disp: , Rfl:    diphenhydrAMINE (BENADRYL) 25 MG tablet, Take 50 mg by mouth at bedtime as needed., Disp: , Rfl:    doxycycline (VIBRA-TABS) 100 MG tablet, Take 1 tablet (100 mg total) by mouth 2 (two) times daily., Disp: 20 tablet, Rfl: 0   Dulaglutide (TRULICITY) 3 MG/0.5ML SOPN, Inject 3 mg as directed once a week., Disp: 2 mL, Rfl: 0   FARXIGA 10 MG TABS tablet, Take 1 tablet (10 mg total) by mouth daily., Disp: 90 tablet, Rfl: 0   fluconazole (DIFLUCAN) 150 MG tablet, Take 1 tablet (150 mg total) by mouth daily., Disp: 1 tablet, Rfl: 1   fluticasone (FLONASE) 50 MCG/ACT nasal spray, SPRAY 2 SPRAYS INTO  EACH NOSTRIL EVERY DAY, Disp: 48 mL, Rfl: 2   gabapentin (NEURONTIN) 800 MG tablet, TAKE 1 TABLET THREE TIMES DAILY, Disp: 270 tablet, Rfl: 3   ibuprofen (ADVIL) 800 MG tablet, TAKE 1 TABLET BY MOUTH EVERY 8 HOURS AS NEEDED, Disp: 42 tablet, Rfl: 0   ipratropium-albuterol (DUONEB) 0.5-2.5 (3) MG/3ML SOLN, Take 3 mLs by nebulization every 4 (four) hours as needed., Disp: 120 mL, Rfl: 0   levothyroxine (SYNTHROID) 75 MCG tablet, TAKE 1 TABLET EVERY DAY, Disp: 90 tablet, Rfl: 1   Lutein 20 MG TABS, Take 1 tablet by mouth daily., Disp: , Rfl:    meloxicam (MOBIC) 15 MG tablet, TAKE 1 TABLET EVERY DAY, Disp: 90 tablet, Rfl: 3   Multiple Vitamin (MULTIVITAMIN WITH MINERALS) TABS tablet, Take 1 tablet by mouth daily., Disp: , Rfl:    omeprazole (PRILOSEC) 20 MG capsule, Take 1 capsule (20 mg total) by mouth daily., Disp: 90 capsule, Rfl: 3   oxyCODONE-acetaminophen (PERCOCET/ROXICET) 5-325 MG tablet, Take 1 tablet by mouth every 6 (six) hours as needed for severe pain., Disp: 20 tablet, Rfl: 0   OXYGEN, 2lpm 2 lpm as needed, Disp: , Rfl:    sacubitril-valsartan (ENTRESTO) 24-26 MG, Take 1 tablet by mouth 2 (two) times  daily., Disp: 180 tablet, Rfl: 3   Tiotropium Bromide-Olodaterol (STIOLTO RESPIMAT) 2.5-2.5 MCG/ACT AERS, INHALE 2 PUFFS INTO THE LUNGS DAILY., Disp: 12 g, Rfl: 5   tiZANidine (ZANAFLEX) 4 MG tablet, TAKE 1 TABLET EVERY 6 HOURS AS NEEDED FOR MUSCLE SPASM(S), Disp: 360 tablet, Rfl: 3   traMADol (ULTRAM) 50 MG tablet, Take 1 tablet (50 mg total) by mouth every 6 (six) hours as needed for severe pain. Take one four times a day as needed for pain, Disp: 360 tablet, Rfl: 0   UNABLE TO FIND, CPAP with o2 2lpm  DME- [facility], Disp: , Rfl:    valACYclovir (VALTREX) 500 MG tablet, Take 1 tablet (500 mg total) by mouth 3 (three) times daily., Disp: 21 tablet, Rfl: 0   VASCEPA 1 g capsule, TAKE 2 CAPSULES BY MOUTH TWICE A DAY, Disp: 360 capsule, Rfl: 1   Vitamin D, Ergocalciferol, (DRISDOL) 1.25 MG (50000  UNIT) CAPS capsule, TAKE 1 CAPSULE EVERY 7 DAYS, Disp: 12 capsule, Rfl: 3  Allergies  Allergen Reactions   Meperidine Nausea And Vomiting and Other (See Comments)    ROS CONSTITUTIONAL: Negative for chills, fatigue, fever,   GASTROINTESTINAL: Negative for abdominal pain, acid reflux symptoms, constipation, diarrhea, nausea and vomiting.  GU - see HPI     Objective:    PHYSICAL EXAM:   BP 110/68 (BP Location: Left Arm, Patient Position: Sitting, Cuff Size: Large)   Pulse 99   Temp (!) 97.2 F (36.2 C) (Temporal)   Ht 5\' 5"  (1.651 m)   Wt 255 lb (115.7 kg)   SpO2 95%   BMI 42.43 kg/m    GEN: Well nourished, well developed, in no acute distress   Cardiac: RRR; no murmurs,  Respiratory:  normal respiratory rate and pattern with no distress - normal breath sounds with no rales, rhonchi, wheezes or rubs  Psych: euthymic mood, appropriate affect and demeanor Office Visit on 11/22/2022  Component Date Value Ref Range Status   Color, UA 11/22/2022 orange (A)  yellow Final   Clarity, UA 11/22/2022 clear  clear Final   Glucose, UA 11/22/2022 =100 (A)  negative mg/dL Final   Bilirubin, UA 82/95/6213 negative  negative Final   Ketones, POC UA 11/22/2022 negative  negative mg/dL Final   Spec Grav, UA 08/65/7846 <=1.005 (A)  1.010 - 1.025 Final   Blood, UA 11/22/2022 negative  negative Final   pH, UA 11/22/2022 6.0  5.0 - 8.0 Final   POC PROTEIN,UA 11/22/2022 negative  negative, trace Final   Urobilinogen, UA 11/22/2022 0.2  0.2 or 1.0 E.U./dL Final   Nitrite, UA 96/29/5284 Negative  Negative Final   Leukocytes, UA 11/22/2022 Negative  Negative Final       Assessment & Plan:    Urethritis -     POCT URINALYSIS DIP (CLINITEK) -     Urine Culture -     Doxycycline Hyclate; Take 1 tablet (100 mg total) by mouth 2 (two) times daily.  Dispense: 20 tablet; Refill: 0  Other orders -     Fluconazole; Take 1 tablet (150 mg total) by mouth daily.  Dispense: 1 tablet; Refill: 1      Follow-up: Return if symptoms worsen or fail to improve.  An After Visit Summary was printed and given to the patient.  Jettie Pagan [facility] [phone]

## 2022-11-23 LAB — URINE CULTURE: Organism ID, Bacteria: NO GROWTH

## 2022-12-01 ENCOUNTER — Telehealth: Payer: Self-pay

## 2022-12-01 NOTE — Telephone Encounter (Signed)
Transition Care Management Follow-up Telephone Call Date of discharge and from where: 10/30/2022 [facility] How have you been since you were released from the hospital? Patient stated her shoulder is feeling much better but she thinks she may shingles on her hand. Any questions or concerns? No  Items Reviewed: Did the pt receive and understand the discharge instructions provided? Yes  Medications obtained and verified? Yes  Other? No  Any new allergies since your discharge? No  Dietary orders reviewed? Yes Do you have support at home? Yes   Follow up appointments reviewed:  PCP Hospital f/u appt confirmed? Yes  Scheduled to see Blane Ohara, MD on 11/02/2022 @ [facility]. Specialist Hospital f/u appt confirmed? No  Scheduled to see  on  @ . Are transportation arrangements needed? No  If their condition worsens, is the pt aware to call PCP or go to the Emergency Dept.? Yes Was the patient provided with contact information for the PCP's office or ED? Yes Was to pt encouraged to call back with questions or concerns? Yes   Yanilen Adamik [facility]  [facility], [facility] Guide Direct Dial: [phone]  Website: [email or web address]

## 2022-12-04 ENCOUNTER — Other Ambulatory Visit: Payer: Self-pay

## 2022-12-04 DIAGNOSIS — I110 Hypertensive heart disease with heart failure: Secondary | ICD-10-CM

## 2022-12-04 DIAGNOSIS — E782 Mixed hyperlipidemia: Secondary | ICD-10-CM

## 2022-12-04 DIAGNOSIS — E1169 Type 2 diabetes mellitus with other specified complication: Secondary | ICD-10-CM

## 2022-12-05 ENCOUNTER — Other Ambulatory Visit: Payer: Medicare HMO

## 2022-12-05 DIAGNOSIS — E1169 Type 2 diabetes mellitus with other specified complication: Secondary | ICD-10-CM

## 2022-12-05 DIAGNOSIS — I5022 Chronic systolic (congestive) heart failure: Secondary | ICD-10-CM | POA: Diagnosis not present

## 2022-12-05 DIAGNOSIS — E785 Hyperlipidemia, unspecified: Secondary | ICD-10-CM | POA: Diagnosis not present

## 2022-12-05 DIAGNOSIS — I110 Hypertensive heart disease with heart failure: Secondary | ICD-10-CM

## 2022-12-05 DIAGNOSIS — E782 Mixed hyperlipidemia: Secondary | ICD-10-CM

## 2022-12-06 ENCOUNTER — Telehealth: Payer: Self-pay

## 2022-12-06 LAB — LIPID PANEL
Chol/HDL Ratio: 3 ratio (ref 0.0–4.4)
Cholesterol, Total: 135 mg/dL (ref 100–199)
HDL: 45 mg/dL (ref >39)
LDL Chol Calc (NIH): 63 mg/dL (ref 0–99)
Triglycerides: 157 mg/dL — ABNORMAL HIGH (ref 0–149)
VLDL Cholesterol Cal: 27 mg/dL (ref 5–40)

## 2022-12-06 LAB — COMPREHENSIVE METABOLIC PANEL
ALT: 25 [IU]/L (ref 0–32)
AST: 17 [IU]/L (ref 0–40)
Albumin: 3.9 g/dL (ref 3.9–4.9)
Alkaline Phosphatase: 129 [IU]/L — ABNORMAL HIGH (ref 44–121)
BUN/Creatinine Ratio: 29 — ABNORMAL HIGH (ref 12–28)
BUN: 21 mg/dL (ref 8–27)
Bilirubin Total: 0.9 mg/dL (ref 0.0–1.2)
CO2: 24 mmol/L (ref 20–29)
Calcium: 9.4 mg/dL (ref 8.7–10.3)
Chloride: 107 mmol/L — ABNORMAL HIGH (ref 96–106)
Creatinine, Ser: 0.72 mg/dL (ref 0.57–1.00)
Globulin, Total: 1.7 g/dL (ref 1.5–4.5)
Glucose: 85 mg/dL (ref 70–99)
Potassium: 4.7 mmol/L (ref 3.5–5.2)
Sodium: 145 mmol/L — ABNORMAL HIGH (ref 134–144)
Total Protein: 5.6 g/dL — ABNORMAL LOW (ref 6.0–8.5)
eGFR: 94 mL/min/{1.73_m2} (ref >59)

## 2022-12-06 LAB — CBC WITH DIFFERENTIAL/PLATELET
Basophils Absolute: 0.1 10*3/uL (ref 0.0–0.2)
Basos: 1 %
EOS (ABSOLUTE): 0.4 10*3/uL (ref 0.0–0.4)
Eos: 5 %
Hematocrit: 42.5 % (ref 34.0–46.6)
Hemoglobin: 14 g/dL (ref 11.1–15.9)
Immature Grans (Abs): 0 10*3/uL (ref 0.0–0.1)
Immature Granulocytes: 0 %
Lymphocytes Absolute: 1.6 10*3/uL (ref 0.7–3.1)
Lymphs: 23 %
MCH: 31.1 pg (ref 26.6–33.0)
MCHC: 32.9 g/dL (ref 31.5–35.7)
MCV: 94 fL (ref 79–97)
Monocytes Absolute: 0.6 10*3/uL (ref 0.1–0.9)
Monocytes: 8 %
Neutrophils Absolute: 4.3 10*3/uL (ref 1.4–7.0)
Neutrophils: 63 %
Platelets: 281 10*3/uL (ref 150–450)
RBC: 4.5 x10E6/uL (ref 3.77–5.28)
RDW: 13.1 % (ref 11.7–15.4)
WBC: 6.9 10*3/uL (ref 3.4–10.8)

## 2022-12-06 LAB — HEMOGLOBIN A1C
Est. average glucose Bld gHb Est-mCnc: 123 mg/dL
Hgb A1c MFr Bld: 5.9 % — ABNORMAL HIGH (ref 4.8–5.6)

## 2022-12-06 NOTE — Telephone Encounter (Signed)
Completed patient assistance application faxed to [employer] for Entresto 24-26 mg.

## 2022-12-07 NOTE — Progress Notes (Signed)
Subjective:  Patient ID: Judy [name], female    DOB: Aug 14, 1960  Age: 62 y.o. MRN: [id number]  Chief Complaint  Patient presents with   Medical Management of Chronic Issues    HPI Diabetes:  Complications: hyperlipidemia Glucose checking: not checking Most recent A1C: 5.9 Current medications: Farxiga 10 mg once daily, Trulicity 3 mg weekly. Last Eye Exam:03/01/21 Foot checks: yes Lifestyle changes: Eating fairly healthy. Unable to exercise due to back pain.    Hyperlipidemia: Current medications: lipitor 20 mg daily, vascepa, coenzyme Q 10 daily Labs reviewed. LDL at goal. Trig 148, HDL 41,  LDL 64, TC 131   Hypertension with chf due to idiopathic cardiomyopathy. Current medications: Coreg 6.25 mg twice daily, Entresto 24-26 mg twice daily, ASA 81 mg daily   Chronic pain syndrome: lumbar back pain secondary to severe scoliosis and harrington rod complication. Taking tramadol 50 mg one to two times per week and tylenol. Has muscle relaxant. Gabapentin 800 mg THREE TIMES A DAY, Using TENS unit.   OSA/COPD: uses cpap. Compliant and benefits. On stiolto. Sees pulmonology. Continues to have shortness of breath with exertion.   Chronic Respiratory Failure with hypoxia. Wears oxygen 2 L at night and with exertion as needed daily.    Hypothyroidism -currently on synthroid 75 mcg. TSH 1.55   Depression, mild, recurrent- wellbutrin XL 300 mg once daily in a.m. clonazepam 0.5 mg 1 twice daily as needed for panic attacks.  Patient very rarely takes this.  PHN: Failing on ibuprofen, gabapentin, tylenol, tramadol, and steroids. As well as voltaren gel. Has some relief with otc lidoderm patches. Helps osteoarthritis spine and shoulders.   Patient is exercising more.      12/08/2022    3:22 PM 08/30/2022    3:41 PM 04/15/2022   10:10 AM 03/24/2022    2:05 PM 03/24/2022    2:04 PM  Depression screen PHQ 2/9  Decreased Interest 0 1 0 0 0  Down, Depressed, Hopeless 0 1 0 0 0  PHQ - 2  Score 0 2 0 0 0  Altered sleeping 2 2 3 2    Tired, decreased energy 1 2 0 1   Change in appetite 0 1 0 0   Feeling bad or failure about yourself  0 0 0 0   Trouble concentrating 1  0 2   Moving slowly or fidgety/restless 0 0 0 0   Suicidal thoughts  0 0 0   PHQ-9 Score 4 7 3 5    Difficult doing work/chores Somewhat difficult  Not difficult at all Not difficult at all         08/30/2022    3:39 PM  Fall Risk   Falls in the past year? 0  Number falls in past yr: 0  Risk for fall due to : No Fall Risks  Follow up Falls evaluation completed    Patient Care Team: Blane Ohara, MD as PCP - General (Family Medicine) Quintella Reichert, MD as PCP - Cardiology (Cardiology) Zettie Pho, [facility] (Inactive) (Pharmacist)   Review of Systems  Constitutional:  Negative for chills, fatigue and fever.  HENT:  Positive for congestion. Negative for ear pain, rhinorrhea and sore throat.   Respiratory:  Negative for cough and shortness of breath.   Cardiovascular:  Negative for chest pain.  Gastrointestinal:  Negative for abdominal pain, constipation, diarrhea, nausea and vomiting.  Genitourinary:  Negative for dysuria and urgency.  Musculoskeletal:  Positive for myalgias (Left pointer finger and thumb pain). Negative  for back pain.  Neurological:  Negative for dizziness, weakness, light-headedness and headaches.  Psychiatric/Behavioral:  Negative for dysphoric mood. The patient is not nervous/anxious.     Current Outpatient Medications on File Prior to Visit  Medication Sig Dispense Refill   albuterol (VENTOLIN HFA) 108 (90 Base) MCG/ACT inhaler Inhale 2 puffs into the lungs every 6 (six) hours as needed for wheezing or shortness of breath. 8 g 2   aspirin 81 MG tablet Take 81 mg by mouth at bedtime.      atorvastatin (LIPITOR) 20 MG tablet TAKE 1 TABLET EVERY DAY 90 tablet 3   buPROPion (WELLBUTRIN XL) 300 MG 24 hr tablet TAKE 1 TABLET EVERY DAY 90 tablet 3   carvedilol (COREG) 6.25 MG tablet  TAKE 1 TABLET TWICE DAILY 180 tablet 3   clonazePAM (KLONOPIN) 0.5 MG tablet Take 1 tablet (0.5 mg total) by mouth 2 (two) times daily as needed for anxiety (panic attacks). 90 tablet 0   Coenzyme Q10 (CO Q 10) 100 MG CAPS Take 200 mg by mouth daily.     diphenhydrAMINE (BENADRYL) 25 MG tablet Take 50 mg by mouth at bedtime as needed.     Dulaglutide (TRULICITY) 3 MG/0.5ML SOPN Inject 3 mg as directed once a week. 2 mL 0   FARXIGA 10 MG TABS tablet Take 1 tablet (10 mg total) by mouth daily. 90 tablet 0   fluticasone (FLONASE) 50 MCG/ACT nasal spray SPRAY 2 SPRAYS INTO EACH NOSTRIL EVERY DAY 48 mL 2   ipratropium-albuterol (DUONEB) 0.5-2.5 (3) MG/3ML SOLN Take 3 mLs by nebulization every 4 (four) hours as needed. 120 mL 0   levothyroxine (SYNTHROID) 75 MCG tablet TAKE 1 TABLET EVERY DAY 90 tablet 1   meloxicam (MOBIC) 15 MG tablet TAKE 1 TABLET EVERY DAY 90 tablet 3   Multiple Vitamin (MULTIVITAMIN WITH MINERALS) TABS tablet Take 1 tablet by mouth daily.     omeprazole (PRILOSEC) 20 MG capsule Take 1 capsule (20 mg total) by mouth daily. 90 capsule 3   oxyCODONE-acetaminophen (PERCOCET/ROXICET) 5-325 MG tablet Take 1 tablet by mouth every 6 (six) hours as needed for severe pain. 20 tablet 0   OXYGEN 2lpm 2 lpm as needed     sacubitril-valsartan (ENTRESTO) 24-26 MG Take 1 tablet by mouth 2 (two) times daily. 180 tablet 3   Tiotropium Bromide-Olodaterol (STIOLTO RESPIMAT) 2.5-2.5 MCG/ACT AERS INHALE 2 PUFFS INTO THE LUNGS DAILY. 12 g 5   tiZANidine (ZANAFLEX) 4 MG tablet TAKE 1 TABLET EVERY 6 HOURS AS NEEDED FOR MUSCLE SPASM(S) 360 tablet 3   traMADol (ULTRAM) 50 MG tablet Take 1 tablet (50 mg total) by mouth every 6 (six) hours as needed for severe pain. Take one four times a day as needed for pain 360 tablet 0   UNABLE TO FIND CPAP with o2 2lpm  DME- [facility]     VASCEPA 1 g capsule TAKE 2 CAPSULES BY MOUTH TWICE A DAY 360 capsule 1   Vitamin D, Ergocalciferol, (DRISDOL) 1.25 MG (50000 UNIT) CAPS  capsule TAKE 1 CAPSULE EVERY 7 DAYS 12 capsule 3   No current facility-administered medications on file prior to visit.   Past Medical History:  Diagnosis Date   Anxiety    Back pain    Bilateral swelling of feet    CAD (coronary artery disease), native coronary artery    coronary CTA 01/2022 < 25% plaque in the mid RCA, LAD and OM1 with coronary Ca score of 1.23.   Chronic combined systolic and  diastolic CHF (congestive heart failure) (HCC) 10/07/2014   COPD (chronic obstructive pulmonary disease) (HCC)    DCM (dilated cardiomyopathy) (HCC) 04/25/2014   EF 28% by MRI despite maximum medical therapy   Depression    Diabetes mellitus without complication (HCC)    Epilepsy (HCC)    as a child   Fatty liver    Former tobacco use    Hyperlipidemia    Hypertension    Hypothyroidism    Joint pain    Leg swelling    Morbid obesity (HCC)    NICM (nonischemic cardiomyopathy) (HCC)    OSA (obstructive sleep apnea)    moderate with AHI 26/hr with oxygen desaturations as low as 70%   Scoliosis    Sleep apnea    SOB (shortness of breath)    Past Surgical History:  Procedure Laterality Date   ABDOMINAL HYSTERECTOMY     ankle artery involved cyst removal     CARDIAC CATHETERIZATION  05/14/2014   normal coronary arteries   CARPAL TUNNEL RELEASE     FASCIOTOMY     1993 for platar fasciitis   harrington rod scoliosis     1981   LEFT HEART CATHETERIZATION WITH CORONARY ANGIOGRAM N/A 05/14/2014   Procedure: LEFT HEART CATHETERIZATION WITH CORONARY ANGIOGRAM;  Surgeon: Iran Ouch, MD;  Location: [facility] CATH [facility];  Service: Cardiovascular;  Laterality: N/A;   ROTATOR CUFF REPAIR Right 06/2021   UMBILICAL HERNIA REPAIR     VESICOVAGINAL FISTULA CLOSURE W/ TAH      Family History  Problem Relation Age of Onset   Diabetes Mother    Hyperlipidemia Mother    Obesity Mother    Emphysema Father    Allergies Father    Heart failure Father    Heart disease Father 49       MI   Diabetes  Father    Hyperlipidemia Father    Hypertension Father    Thyroid disease Father    Alcohol abuse Father    Obesity Father    Breast cancer Neg Hx    Social History   Socioeconomic History   Marital status: Married    Spouse name: Not on file   Number of children: Not on file   Years of education: Not on file   Highest education level: Some college, no degree  Occupational History   Occupation: disabled  Tobacco Use   Smoking status: Former    Current packs/day: 0.00    Average packs/day: 1 pack/day for 20.0 years (20.0 ttl pk-yrs)    Types: Cigarettes    Start date: 02/14/1985    Quit date: 02/14/2005    Years since quitting: 17.8   Smokeless tobacco: Never  Vaping Use   Vaping status: Never Used  Substance and Sexual Activity   Alcohol use: Yes    Alcohol/week: 2.0 standard drinks of alcohol    Types: 2 Shots of liquor per week    Comment: socially   Drug use: No   Sexual activity: Not Currently  Other Topics Concern   Not on file  Social History Narrative   Lives in [place] with spouse and son.   Previously worked as a Comptroller         Social Determinants of Corporate investment banker Strain: Low Risk  (08/26/2022)   Overall Financial Resource Strain (CARDIA)    Difficulty of Paying Living Expenses: Not very hard  Recent Concern: Financial Resource Strain - High Risk (06/23/2022)   Overall  Financial Resource Strain (CARDIA)    Difficulty of Paying Living Expenses: Very hard  Food Insecurity: No Food Insecurity (08/26/2022)   Hunger Vital Sign    Worried About Running Out of Food in the Last Year: Never true    Ran Out of Food in the Last Year: Never true  Transportation Needs: No Transportation Needs (08/26/2022)   PRAPARE - Administrator, Civil Service (Medical): No    Lack of Transportation (Non-Medical): No  Physical Activity: Insufficiently Active (08/26/2022)   Exercise Vital Sign    Days of Exercise per Week: 2 days     Minutes of Exercise per Session: 20 min  Stress: No Stress Concern Present (08/26/2022)   [employer] of Occupational Health - Occupational Stress Questionnaire    Feeling of Stress : Only a little  Social Connections: Socially Isolated (08/26/2022)   Social Connection and Isolation Panel [NHANES]    Frequency of Communication with Friends and Family: Once a week    Frequency of Social Gatherings with Friends and Family: Once a week    Attends Religious Services: Never    Diplomatic Services operational officer: No    Attends Engineer, structural: Never    Marital Status: Married    Objective:  BP 116/72 (BP Location: Left Arm, Patient Position: Sitting)   Pulse 85   Temp 97.6 F (36.4 C) (Temporal)   Ht 5\' 5"  (1.651 m)   Wt 254 lb (115.2 kg)   SpO2 95%   BMI 42.27 kg/m      12/08/2022    3:26 PM 11/22/2022   10:33 AM 11/02/2022    3:18 PM  BP/Weight  Systolic BP 116 110 110  Diastolic BP 72 68 80  Wt. (Lbs) 254 255 250  BMI 42.27 kg/m2 42.43 kg/m2 41.6 kg/m2    Physical Exam Vitals reviewed.  Constitutional:      Appearance: Normal appearance. She is obese.  HENT:     Right Ear: Tympanic membrane normal.     Left Ear: Tympanic membrane normal.     Nose: Congestion present.     Right Turbinates: Enlarged and swollen.     Left Turbinates: Enlarged and swollen.     Right Sinus: Maxillary sinus tenderness present.     Left Sinus: Maxillary sinus tenderness present.  Neck:     Vascular: No carotid bruit.  Cardiovascular:     Rate and Rhythm: Normal rate and regular rhythm.     Heart sounds: Normal heart sounds.  Pulmonary:     Effort: Pulmonary effort is normal. No respiratory distress.     Breath sounds: Normal breath sounds.  Abdominal:     General: Abdomen is flat. Bowel sounds are normal.     Palpations: Abdomen is soft.     Tenderness: There is no abdominal tenderness.  Neurological:     Mental Status: She is alert and oriented to person,  place, and time.     Comments: Left hand very sensitive to touch. Scars fading from shingles.   Psychiatric:        Mood and Affect: Mood normal.        Behavior: Behavior normal.     Diabetic Foot Exam - Simple   Simple Foot Form Diabetic Foot exam was performed with the following findings: Yes 12/08/2022  6:59 PM  Visual Inspection No deformities, no ulcerations, no other skin breakdown bilaterally: Yes Sensation Testing Intact to touch and monofilament testing bilaterally: Yes Pulse Check Posterior  Tibialis and Dorsalis pulse intact bilaterally: Yes Comments      Lab Results  Component Value Date   WBC 6.9 12/05/2022   HGB 14.0 12/05/2022   HCT 42.5 12/05/2022   PLT 281 12/05/2022   GLUCOSE 85 12/05/2022   CHOL 135 12/05/2022   TRIG 157 (H) 12/05/2022   HDL 45 12/05/2022   LDLCALC 63 12/05/2022   ALT 25 12/05/2022   AST 17 12/05/2022   NA 145 (H) 12/05/2022   K 4.7 12/05/2022   CL 107 (H) 12/05/2022   CREATININE 0.72 12/05/2022   BUN 21 12/05/2022   CO2 24 12/05/2022   TSH 1.550 08/26/2022   INR 1.0 05/13/2014   HGBA1C 5.9 (H) 12/05/2022   MICROALBUR Negative 09/03/2019      Assessment & Plan:    Hypertensive heart disease with chronic systolic congestive heart failure (HCC) Assessment & Plan: Well controlled.  No changes to medicines. Continue farxiga 10 mg daily, Coreg 6.25 mg twice daily, Entresto 24-26 mg twice daily, ASA 81 mg daily Continue to work on eating a healthy diet and exercise.  Labs reviewed.    Mixed hyperlipidemia Assessment & Plan: Well controlled.  No changes to medicines. Continue lipitor, vascepa, coenzyme Q 10 daily Continue to work on eating a healthy diet and exercise.  Labs reviewed    Hyperlipidemia associated with type 2 diabetes mellitus (HCC) Assessment & Plan: Diabetes and cholesterol at goal Recommend check feet daily. Recommend annual eye exams. Medicines: Continue Farxiga 10 mg once daily, Trulicity 3 mg  once daily, atorvastatin 20 mg before bed, vascepa 1 gm 2 capsules twice daily, and coenzyme q10.  Continue to work on eating a healthy diet and exercise.  Labs reviewed   Other specified hypothyroidism Assessment & Plan: Previously well controlled Continue Synthroid at current dose     COPD GOLD  III  Assessment & Plan: Stable. Lungs doing well.  Continue stiolto. Continue albuterol as needed    Chronic respiratory failure with hypoxia (HCC) Assessment & Plan: Continue oxygen 2 L at night and with CPAP.  Oxygen as needed during the day.    Morbid obesity due to excess calories [facility]) Assessment & Plan: Recommend continue to work on eating healthy diet and exercise.    Acute non-recurrent sinusitis of other sinus Assessment & Plan: Augmentin  Orders: -     Amoxicillin-Pot Clavulanate; Take 1 tablet by mouth 2 (two) times daily.  Dispense: 20 tablet; Refill: 0  Post herpetic neuralgia Assessment & Plan: Start on lyrica 150 mg one twice daily x 1 week, then increase to 2 oral twice daily.   Decrease gabapentin to 400 mg three times a day x 1 week and then stop.    Other orders -     Lidocaine; Place 3 patches onto the skin daily. Remove & Discard patch within 12 hours or as directed by MD  Dispense: 90 patch; Refill: 3 -     Pregabalin; Take 1 capsule (150 mg total) by mouth 2 (two) times daily for 7 days, THEN 2 capsules (300 mg total) 2 (two) times daily for 23 days.  Dispense: 106 capsule; Refill: 0     Meds ordered this encounter  Medications   amoxicillin-clavulanate (AUGMENTIN) 875-125 MG tablet    Sig: Take 1 tablet by mouth 2 (two) times daily.    Dispense:  20 tablet    Refill:  0   lidocaine (LIDODERM) 5 %    Sig: Place 3 patches onto the skin daily.  Remove & Discard patch within 12 hours or as directed by MD    Dispense:  90 patch    Refill:  3   pregabalin (LYRICA) 150 MG capsule    Sig: Take 1 capsule (150 mg total) by mouth 2 (two) times daily  for 7 days, THEN 2 capsules (300 mg total) 2 (two) times daily for 23 days.    Dispense:  106 capsule    Refill:  0    No orders of the defined types were placed in this encounter.    Follow-up: No follow-ups on file.   I,Katherina A Bramblett,acting as a scribe for Blane Ohara, MD.,have documented all relevant documentation on the behalf of Blane Ohara, MD,as directed by  Blane Ohara, MD while in the presence of Blane Ohara, MD.   Clayborn Bigness I Leal-Borjas,acting as a scribe for Blane Ohara, MD.,have documented all relevant documentation on the behalf of Blane Ohara, MD,as directed by  Blane Ohara, MD while in the presence of Blane Ohara, MD.    An After Visit Summary was printed and given to the patient.  Blane Ohara, MD [facility] [phone]

## 2022-12-07 NOTE — Telephone Encounter (Signed)
Patient assistance for Judy [name] has been approved thru 02/14/2024 at no cost.  Ordering instructions have been mailed to patient.  Patient ID [id number]

## 2022-12-08 ENCOUNTER — Encounter: Payer: Self-pay | Admitting: Family Medicine

## 2022-12-08 ENCOUNTER — Ambulatory Visit (INDEPENDENT_AMBULATORY_CARE_PROVIDER_SITE_OTHER): Payer: Medicare HMO | Admitting: Family Medicine

## 2022-12-08 VITALS — BP 116/72 | HR 85 | Temp 97.6°F | Ht 65.0 in | Wt 254.0 lb

## 2022-12-08 DIAGNOSIS — E1169 Type 2 diabetes mellitus with other specified complication: Secondary | ICD-10-CM

## 2022-12-08 DIAGNOSIS — F330 Major depressive disorder, recurrent, mild: Secondary | ICD-10-CM

## 2022-12-08 DIAGNOSIS — J018 Other acute sinusitis: Secondary | ICD-10-CM

## 2022-12-08 DIAGNOSIS — I429 Cardiomyopathy, unspecified: Secondary | ICD-10-CM

## 2022-12-08 DIAGNOSIS — I5022 Chronic systolic (congestive) heart failure: Secondary | ICD-10-CM | POA: Diagnosis not present

## 2022-12-08 DIAGNOSIS — J9611 Chronic respiratory failure with hypoxia: Secondary | ICD-10-CM

## 2022-12-08 DIAGNOSIS — J449 Chronic obstructive pulmonary disease, unspecified: Secondary | ICD-10-CM

## 2022-12-08 DIAGNOSIS — E038 Other specified hypothyroidism: Secondary | ICD-10-CM

## 2022-12-08 DIAGNOSIS — E782 Mixed hyperlipidemia: Secondary | ICD-10-CM | POA: Diagnosis not present

## 2022-12-08 DIAGNOSIS — I110 Hypertensive heart disease with heart failure: Secondary | ICD-10-CM

## 2022-12-08 DIAGNOSIS — E785 Hyperlipidemia, unspecified: Secondary | ICD-10-CM

## 2022-12-08 DIAGNOSIS — B0229 Other postherpetic nervous system involvement: Secondary | ICD-10-CM

## 2022-12-08 MED ORDER — PREGABALIN 150 MG PO CAPS: ORAL_CAPSULE | ORAL | 0 refills | Status: DC

## 2022-12-08 MED ORDER — AMOXICILLIN-POT CLAVULANATE 875-125 MG PO TABS: 1.0000 | ORAL_TABLET | Freq: Two times a day (BID) | ORAL | 0 refills | Status: DC

## 2022-12-08 MED ORDER — LIDOCAINE 5 % EX PTCH: 3.0000 | MEDICATED_PATCH | CUTANEOUS | 3 refills | Status: AC

## 2022-12-08 NOTE — Patient Instructions (Signed)
Start on lyrica 150 mg one twice daily x 1 week, then increase to 2 oral twice daily.   Decrease gabapentin to 400 mg three times a day x 1 week and then stop.   Augmentin for a sinus infection.

## 2022-12-09 ENCOUNTER — Telehealth: Payer: Self-pay

## 2022-12-09 ENCOUNTER — Other Ambulatory Visit: Payer: Self-pay | Admitting: Family Medicine

## 2022-12-09 DIAGNOSIS — M7542 Impingement syndrome of left shoulder: Secondary | ICD-10-CM

## 2022-12-09 NOTE — Telephone Encounter (Signed)
PA submitted and approved via covermymeds for lidocaine patches. ?

## 2022-12-11 DIAGNOSIS — B0229 Other postherpetic nervous system involvement: Secondary | ICD-10-CM | POA: Insufficient documentation

## 2022-12-11 NOTE — Assessment & Plan Note (Signed)
Continue oxygen 2 L at night and with CPAP.  Oxygen as needed during the day.

## 2022-12-11 NOTE — Assessment & Plan Note (Signed)
Augmentin

## 2022-12-11 NOTE — Assessment & Plan Note (Signed)
Previously well controlled Continue Synthroid at current dose  

## 2022-12-11 NOTE — Assessment & Plan Note (Signed)
Stable. Lungs doing well.  Continue stiolto. Continue albuterol as needed

## 2022-12-11 NOTE — Assessment & Plan Note (Deleted)
Well controlled.  ?No changes to medicines.  ?Continue to work on eating a healthy diet and exercise.  ?Labs drawn today.  ?

## 2022-12-11 NOTE — Assessment & Plan Note (Signed)
Recommend continue to work on eating healthy diet and exercise.  

## 2022-12-11 NOTE — Assessment & Plan Note (Signed)
Well controlled.  No changes to medicines. Continue lipitor, vascepa, coenzyme Q 10 daily. Continue to work on eating a healthy diet and exercise.  Labs reviewed.

## 2022-12-11 NOTE — Assessment & Plan Note (Signed)
Start on lyrica 150 mg one twice daily x 1 week, then increase to 2 oral twice daily.   Decrease gabapentin to 400 mg three times a day x 1 week and then stop.

## 2022-12-11 NOTE — Assessment & Plan Note (Signed)
Well controlled.  No changes to medicines. Continue farxiga 10 mg daily, Coreg 6.25 mg twice daily, Entresto 24-26 mg twice daily, ASA 81 mg daily Continue to work on eating a healthy diet and exercise.  Labs reviewed.

## 2022-12-11 NOTE — Assessment & Plan Note (Signed)
Diabetes and cholesterol at goal Recommend check feet daily. Recommend annual eye exams. Medicines: Continue Farxiga 10 mg once daily, Trulicity 3 mg once daily, atorvastatin 20 mg before bed, vascepa 1 gm 2 capsules twice daily, and coenzyme q10.  Continue to work on eating a healthy diet and exercise.  Labs reviewed

## 2022-12-16 IMAGING — CR DG CERVICAL SPINE 2 OR 3 VIEWS
2 series · 2 of 2 positions shown · non-contrast
Comparison: 10/28/2014

CLINICAL DATA: Neck pain, right arm numbness

EXAM:
CERVICAL SPINE - 2-3 VIEW

[w c-spine lat *]
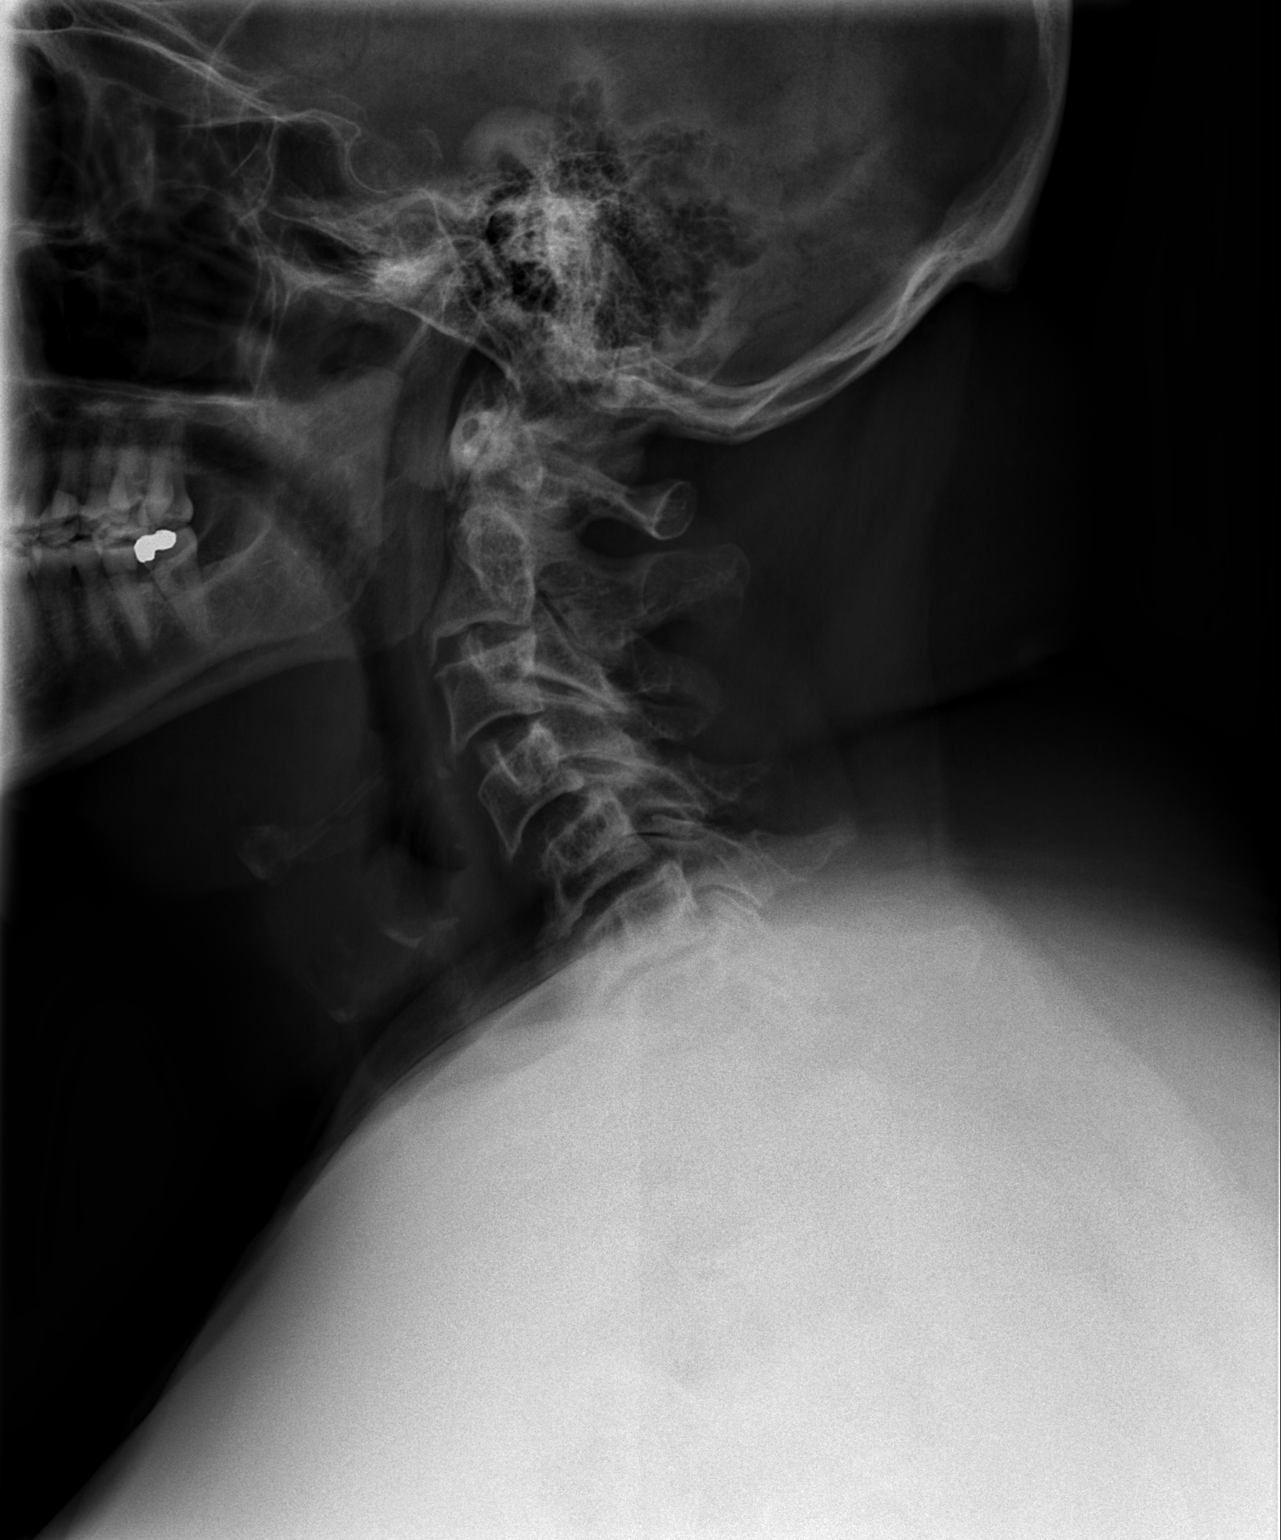

[w c-spine a.p. *]
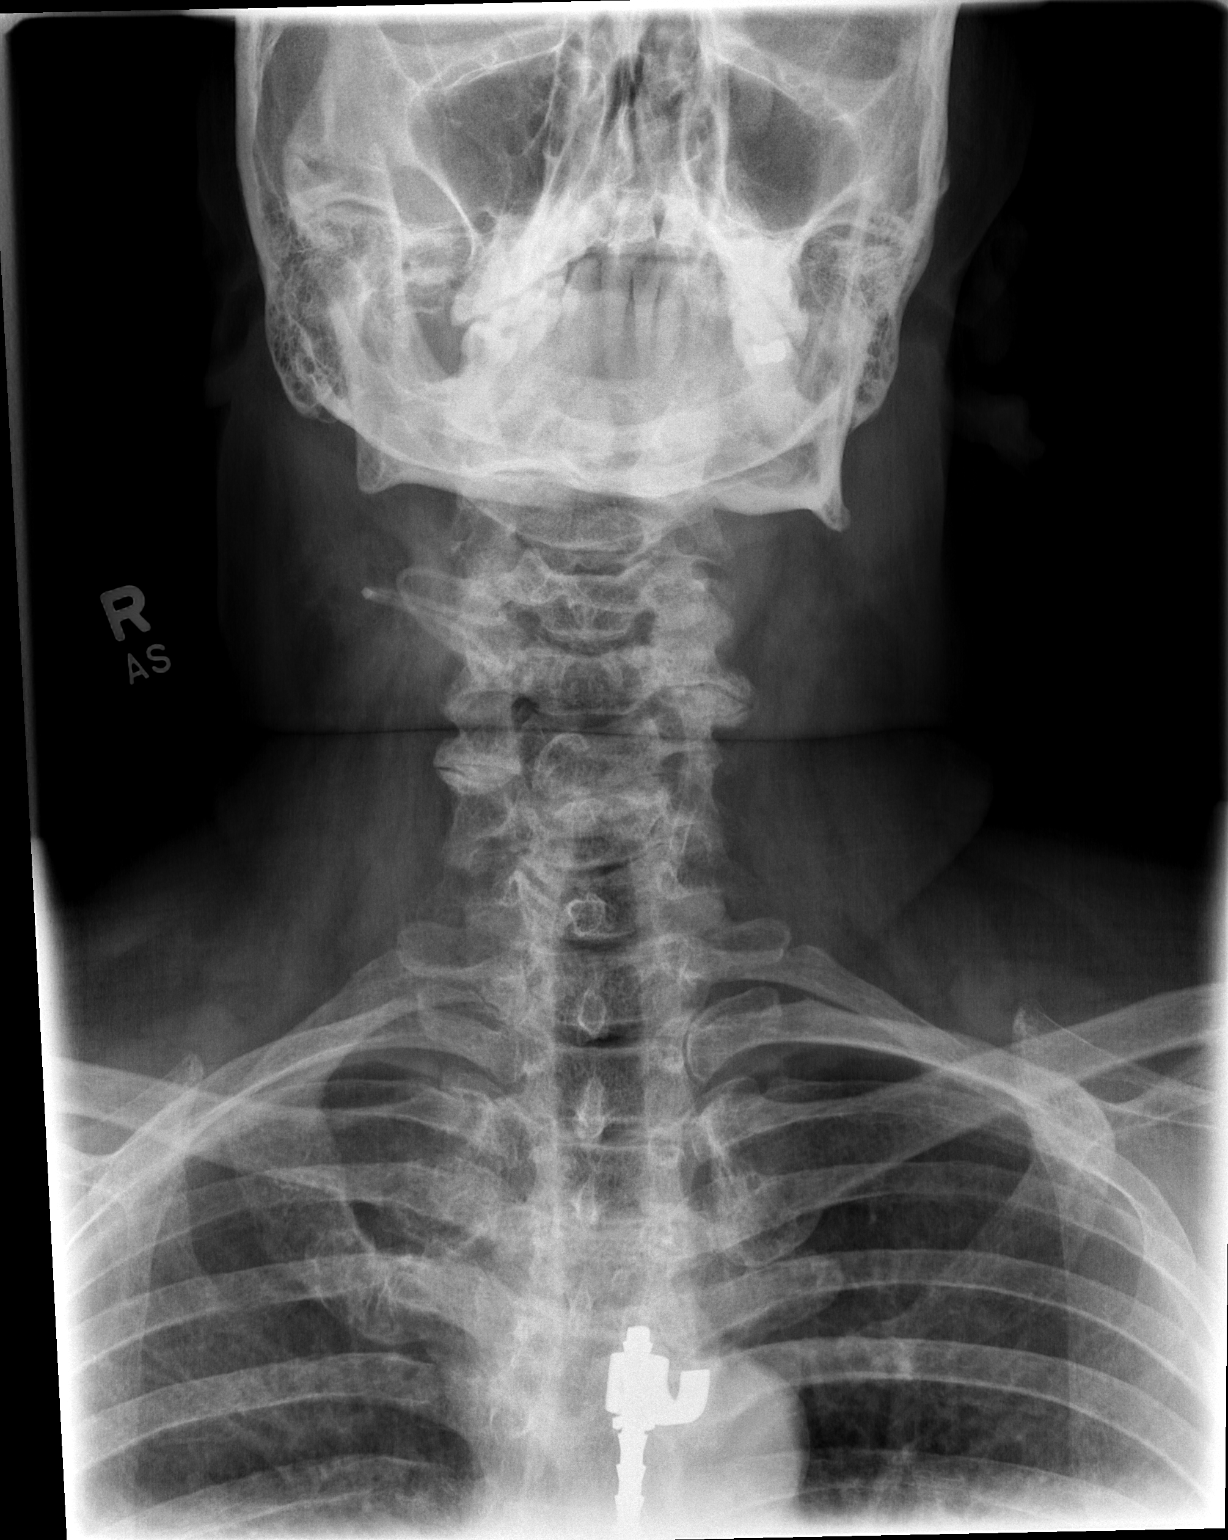

[2 of 2 positions shown; findings below may reference images not displayed]

FINDINGS: Frontal and lateral views of the cervical spine are obtained. The
cervicothoracic junction is obscured by overlying structures. There
is exaggerated cervical lordosis, with mild anterolisthesis of C4 on
C5 and C5 on C6. There is prominent facet hypertrophy from C3
through C5. Moderate spondylosis identified at C5-6 and C6-7. No
acute fracture. Prevertebral soft tissues are unremarkable.
IMPRESSION: 1. Multilevel spondylosis and facet hypertrophy as above.

## 2022-12-22 ENCOUNTER — Telehealth: Payer: Self-pay

## 2022-12-22 NOTE — Telephone Encounter (Signed)
Completed patient assistance application faxed to [facility] for [facility] & Stiolto

## 2022-12-28 ENCOUNTER — Other Ambulatory Visit: Payer: Self-pay

## 2022-12-29 MED ORDER — PREGABALIN 300 MG PO CAPS: 300.0000 mg | ORAL_CAPSULE | Freq: Two times a day (BID) | ORAL | 1 refills | Status: DC

## 2023-01-23 ENCOUNTER — Encounter: Payer: Self-pay | Admitting: Family Medicine

## 2023-01-23 NOTE — Telephone Encounter (Signed)
 Care team updated and letter sent for eye exam notes.

## 2023-01-26 ENCOUNTER — Telehealth: Payer: Self-pay

## 2023-01-26 NOTE — Telephone Encounter (Signed)
PAP: PAP application for [employer], and Trulicity, Publishing rights manager ([employer]), [employer], and [employer]) has been mailed to [employer] address on file. Will fax provider portion of application to provider's office when pt's portion is received.   Jardiance and Stiloto have been approved through [facility] through 2025

## 2023-01-30 ENCOUNTER — Other Ambulatory Visit: Payer: Self-pay

## 2023-01-30 DIAGNOSIS — J3089 Other allergic rhinitis: Secondary | ICD-10-CM

## 2023-01-30 MED ORDER — FLUTICASONE PROPIONATE 50 MCG/ACT NA SUSP: 2.0000 | Freq: Every day | NASAL | 2 refills | Status: AC

## 2023-01-30 MED ORDER — PREGABALIN 300 MG PO CAPS: 300.0000 mg | ORAL_CAPSULE | Freq: Two times a day (BID) | ORAL | 1 refills | Status: DC

## 2023-01-31 ENCOUNTER — Other Ambulatory Visit: Payer: Self-pay | Admitting: Family Medicine

## 2023-01-31 MED ORDER — IPRATROPIUM-ALBUTEROL 0.5-2.5 (3) MG/3ML IN SOLN: 3.0000 mL | RESPIRATORY_TRACT | 0 refills | Status: DC | PRN

## 2023-01-31 NOTE — Telephone Encounter (Signed)
PAP: Application for Trulicity has been submitted to PAP Companies: lilly, via fax   Per patient she has been approved for [employer] and Jardiance through 2025

## 2023-02-01 ENCOUNTER — Other Ambulatory Visit: Payer: Self-pay | Admitting: Family Medicine

## 2023-02-07 NOTE — Telephone Encounter (Signed)
PAP: Patient assistance application for Trulicity has been approved by PAP Companies: lilly from 02/15/2023 to 02/14/2024. Medication should be delivered to PAP Delivery: Home For further shipping updates, please contact [employer] at [phone] Per [place] at [employer]

## 2023-02-14 ENCOUNTER — Other Ambulatory Visit: Payer: Self-pay

## 2023-02-14 MED ORDER — IPRATROPIUM-ALBUTEROL 0.5-2.5 (3) MG/3ML IN SOLN: 3.0000 mL | RESPIRATORY_TRACT | 0 refills | Status: DC | PRN

## 2023-02-22 ENCOUNTER — Other Ambulatory Visit: Payer: Self-pay | Admitting: Family Medicine

## 2023-03-02 ENCOUNTER — Ambulatory Visit: Payer: Medicare HMO | Attending: Cardiology | Admitting: Cardiology

## 2023-03-02 ENCOUNTER — Encounter: Payer: Self-pay | Admitting: Cardiology

## 2023-03-02 ENCOUNTER — Other Ambulatory Visit: Payer: Self-pay | Admitting: Family Medicine

## 2023-03-02 VITALS — BP 138/90 | HR 80 | Ht 65.0 in | Wt 263.8 lb

## 2023-03-02 DIAGNOSIS — G4733 Obstructive sleep apnea (adult) (pediatric): Secondary | ICD-10-CM

## 2023-03-02 DIAGNOSIS — I110 Hypertensive heart disease with heart failure: Secondary | ICD-10-CM

## 2023-03-02 DIAGNOSIS — E7800 Pure hypercholesterolemia, unspecified: Secondary | ICD-10-CM | POA: Diagnosis not present

## 2023-03-02 DIAGNOSIS — I2510 Atherosclerotic heart disease of native coronary artery without angina pectoris: Secondary | ICD-10-CM | POA: Diagnosis not present

## 2023-03-02 DIAGNOSIS — I420 Dilated cardiomyopathy: Secondary | ICD-10-CM

## 2023-03-02 DIAGNOSIS — J449 Chronic obstructive pulmonary disease, unspecified: Secondary | ICD-10-CM

## 2023-03-02 DIAGNOSIS — I10 Essential (primary) hypertension: Secondary | ICD-10-CM | POA: Diagnosis not present

## 2023-03-02 DIAGNOSIS — I5032 Chronic diastolic (congestive) heart failure: Secondary | ICD-10-CM | POA: Diagnosis not present

## 2023-03-02 DIAGNOSIS — I2583 Coronary atherosclerosis due to lipid rich plaque: Secondary | ICD-10-CM | POA: Diagnosis not present

## 2023-03-02 NOTE — Progress Notes (Signed)
Date:  03/02/2023   ID:  Judy [name], DOB 30-Nov-1960, MRN [id number] The patient was identified using 2 identifiers.  PCP:  Blane Ohara, MD   [facility] Providers Cardiologist:  Armanda Magic, MD     Evaluation Performed:  Follow-Up Visit  Chief Complaint:  CHF, DCM, HTN, OSA  History of Present Illness:    Judy [name] is a 63 y.o. female with a hx of  NICM, chronic combined CHF, OSA on PAP, COPD, former tobacco abuse, HTN, HLD, morbid obesity. Prior echo 12/2013 showed EF 45-50%. She had abnormal stress test 04/2014 resulting in cardiac cath 05/14/14 showing no significant CAD, EF 30%.     Subsequent echo 08/2014 (poor quality) with EF approximately 35%. She was treated with medical therapy and subsequent cMRI 09/2014 showed diffuse HK, EF 28%, no infiltrative disease.  AICD recommended but she had been hesitant to pursue. F/U echo was obtained 07/29/16 showed EF approximately 45-50%, mild LVH, grade 2 DD, mild MR, mildly reduced RV function, PASP .  Repeat echo 06/19/2017 showed normalization of LV function with EF 55%.  Coronary CTA 12/23 for CP showed minimal < 25% plaque in the mid RCA, LAD and OM1 with coronary Ca score of 1.23. She has an echo pending next week.   She is here today for followup and is doing well.  She recently called in with some DOE and epigastric pain and got a coronary CTA that showed minimal nonobstructive CAD.   She is here today for followup and is doing well.  She continues to have chronic SOB which she thinks has gotten worse recently but she has also started exercising much more than she had before.  She tells me that she is breathing fine if she starts off slow but then gets SOB if she is pushing it and her O2 sats drop into the low 80's. She denies any chest pain or pressure, PND, orthopnea,  dizziness, palpitations or syncope. She had some LE edema while at the beach and it resolved after wearing compression hose. She is compliant with her  meds and is tolerating meds with no SE.    She is doing well with her PAP device and thinks that she has gotten used to it.  She tolerates the nasal pillow mask and feels the pressure is adequate.  Since going on PAP she feels rested in the am and has no significant daytime sleepiness.  She denies any significant  nasal dryness or nasal congestion.  She has mouth dryness but does not like using a chin strap or FFM.    Past Medical History:  Diagnosis Date   Anxiety    Back pain    Bilateral swelling of feet    CAD (coronary artery disease), native coronary artery    coronary CTA 01/2022 < 25% plaque in the mid RCA, LAD and OM1 with coronary Ca score of 1.23.   Chronic combined systolic and diastolic CHF (congestive heart failure) (HCC) 10/07/2014   COPD (chronic obstructive pulmonary disease) (HCC)    DCM (dilated cardiomyopathy) (HCC) 04/25/2014   EF 28% by MRI despite maximum medical therapy   Depression    Diabetes mellitus without complication (HCC)    Epilepsy (HCC)    as a child   Fatty liver    Former tobacco use    Hyperlipidemia    Hypertension    Hypothyroidism    Joint pain    Leg swelling    Morbid obesity (HCC)  NICM (nonischemic cardiomyopathy) (HCC)    OSA (obstructive sleep apnea)    moderate with AHI 26/hr with oxygen desaturations as low as 70%   Scoliosis    Sleep apnea    SOB (shortness of breath)    Past Surgical History:  Procedure Laterality Date   ABDOMINAL HYSTERECTOMY     ankle artery involved cyst removal     CARDIAC CATHETERIZATION  05/14/2014   normal coronary arteries   CARPAL TUNNEL RELEASE     FASCIOTOMY     1993 for platar fasciitis   harrington rod scoliosis     1981   LEFT HEART CATHETERIZATION WITH CORONARY ANGIOGRAM N/A 05/14/2014   Procedure: LEFT HEART CATHETERIZATION WITH CORONARY ANGIOGRAM;  Surgeon: Iran Ouch, MD;  Location: [facility] CATH LAB;  Service: Cardiovascular;  Laterality: N/A;   ROTATOR CUFF REPAIR Right 06/2021    UMBILICAL HERNIA REPAIR     VESICOVAGINAL FISTULA CLOSURE W/ TAH       Current Meds  Medication Sig   albuterol (VENTOLIN HFA) 108 (90 Base) MCG/ACT inhaler Inhale 2 puffs into the lungs every 6 (six) hours as needed for wheezing or shortness of breath.   amoxicillin-clavulanate (AUGMENTIN) 875-125 MG tablet Take 1 tablet by mouth 2 (two) times daily.   aspirin 81 MG tablet Take 81 mg by mouth at bedtime.    atorvastatin (LIPITOR) 20 MG tablet TAKE 1 TABLET EVERY DAY   buPROPion (WELLBUTRIN XL) 300 MG 24 hr tablet TAKE 1 TABLET EVERY DAY   carvedilol (COREG) 6.25 MG tablet TAKE 1 TABLET TWICE DAILY   clonazePAM (KLONOPIN) 0.5 MG tablet Take 1 tablet (0.5 mg total) by mouth 2 (two) times daily as needed for anxiety (panic attacks).   Coenzyme Q10 (CO Q 10) 100 MG CAPS Take 200 mg by mouth daily.   diphenhydrAMINE (BENADRYL) 25 MG tablet Take 50 mg by mouth at bedtime as needed.   Dulaglutide (TRULICITY) 3 MG/0.5ML SOPN Inject 3 mg as directed once a week.   empagliflozin (JARDIANCE) 10 MG TABS tablet Take by mouth daily.   FARXIGA 10 MG TABS tablet Take 1 tablet (10 mg total) by mouth daily.   fluticasone (FLONASE) 50 MCG/ACT nasal spray Place 2 sprays into both nostrils daily.   ibuprofen (ADVIL) 800 MG tablet TAKE 1 TABLET BY MOUTH EVERY 8 HOURS AS NEEDED   ipratropium-albuterol (DUONEB) 0.5-2.5 (3) MG/3ML SOLN Take 3 mLs by nebulization every 4 (four) hours as needed.   levothyroxine (SYNTHROID) 75 MCG tablet TAKE 1 TABLET EVERY DAY   lidocaine (LIDODERM) 5 % Place 3 patches onto the skin daily. Remove & Discard patch within 12 hours or as directed by MD   meloxicam (MOBIC) 15 MG tablet TAKE 1 TABLET EVERY DAY   Multiple Vitamin (MULTIVITAMIN WITH MINERALS) TABS tablet Take 1 tablet by mouth daily.   omeprazole (PRILOSEC) 20 MG capsule Take 1 capsule (20 mg total) by mouth daily.   oxyCODONE-acetaminophen (PERCOCET/ROXICET) 5-325 MG tablet Take 1 tablet by mouth every 6 (six) hours as  needed for severe pain.   OXYGEN 2lpm 2 lpm as needed   pregabalin (LYRICA) 300 MG capsule Take 1 capsule (300 mg total) by mouth 2 (two) times daily.   sacubitril-valsartan (ENTRESTO) 24-26 MG Take 1 tablet by mouth 2 (two) times daily.   Tiotropium Bromide-Olodaterol (STIOLTO RESPIMAT) 2.5-2.5 MCG/ACT AERS INHALE 2 PUFFS INTO THE LUNGS DAILY.   tiZANidine (ZANAFLEX) 4 MG tablet TAKE 1 TABLET EVERY 6 HOURS AS NEEDED FOR MUSCLE SPASM(S)  traMADol (ULTRAM) 50 MG tablet Take 1 tablet (50 mg total) by mouth every 6 (six) hours as needed for severe pain. Take one four times a day as needed for pain   UNABLE TO FIND CPAP with o2 2lpm  DME- [facility]   VASCEPA 1 g capsule TAKE 2 CAPSULES BY MOUTH TWICE A DAY   Vitamin D, Ergocalciferol, (DRISDOL) 1.25 MG (50000 UNIT) CAPS capsule TAKE 1 CAPSULE EVERY 7 DAYS     Allergies:   Meperidine   Social History   Tobacco Use   Smoking status: Former    Current packs/day: 0.00    Average packs/day: 1 pack/day for 20.0 years (20.0 ttl pk-yrs)    Types: Cigarettes    Start date: 02/14/1985    Quit date: 02/14/2005    Years since quitting: 18.0   Smokeless tobacco: Never  Vaping Use   Vaping status: Never Used  Substance Use Topics   Alcohol use: Yes    Alcohol/week: 2.0 standard drinks of alcohol    Types: 2 Shots of liquor per week    Comment: socially   Drug use: No     Family Hx: The patient's family history includes Alcohol abuse in her father; Allergies in her father; Diabetes in her father and mother; Emphysema in her father; Heart disease (age of onset: 49) in her father; Heart failure in her father; Hyperlipidemia in her father and mother; Hypertension in her father; Obesity in her father and mother; Thyroid disease in her father. There is no history of Breast cancer.  ROS:   Please see the history of present illness.     All other systems reviewed and are negative.   Prior CV studies:   The following studies were reviewed today:  PAP  compliance download  Labs/Other Tests and Data Reviewed:     Recent Labs: 08/26/2022: TSH 1.550 12/05/2022: ALT 25; BUN 21; Creatinine, Ser 0.72; Hemoglobin 14.0; Platelets 281; Potassium 4.7; Sodium 145   Recent Lipid Panel Lab Results  Component Value Date/Time   CHOL 135 12/05/2022 08:37 AM   TRIG 157 (H) 12/05/2022 08:37 AM   HDL 45 12/05/2022 08:37 AM   CHOLHDL 3.0 12/05/2022 08:37 AM   CHOLHDL 3.6 07/12/2016 11:15 AM   LDLCALC 63 12/05/2022 08:37 AM    Wt Readings from Last 3 Encounters:  03/02/23 263 lb 12.8 oz (119.7 kg)  12/08/22 254 lb (115.2 kg)  11/22/22 255 lb (115.7 kg)     Risk Assessment/Calculations:          Objective:    Vital Signs:  BP (!) 138/90 (BP Location: Right Arm, Cuff Size: Large)   Pulse 80   Ht 5\' 5"  (1.651 m)   Wt 263 lb 12.8 oz (119.7 kg)   SpO2 95%   BMI 43.90 kg/m   GEN: Well nourished, well developed in no acute distress HEENT: Normal NECK: No JVD; No carotid bruits LYMPHATICS: No lymphadenopathy CARDIAC:RRR, no murmurs, rubs, gallops RESPIRATORY:  Clear to auscultation without rales, wheezing or rhonchi  ABDOMEN: Soft, non-tender, non-distended MUSCULOSKELETAL:  No edema; No deformity  SKIN: Warm and dry NEUROLOGIC:  Alert and oriented x 3 PSYCHIATRIC:  Normal affect  ASSESSMENT & PLAN:    1.  Chronic diastolic CHF -She remains NYHA class IIb -She continue to have chronic dyspnea on exertion which has been multifactorial from COPD CHF and morbid obesity in the past  -Shortness of breath recently increased but she has been exercising more and gets SOB when she pushes herself.  She has intermittent lower extremity edema remains stable.  Her chronic shortness of breath is mainly from open obesity and COPD and she is followed by pulmonary -Continue prescription drug management with carvedilol 6.25 mg twice daily, Jardiance 10mg  daily,  Entresto 24-26 mg twice daily with as needed refills -She has not required any diuretics -I  have personally reviewed and interpreted outside labs performed by patient's PCP which showed serum creatinine 0.72 and potassium 4.7 on 12/05/2022 -will get back in with Pulmonary due to worsening SOB -check BNP and 2D echo due to SOB   2.  Nonischemic DCM -essentially normal coronary arteries by cath 2016 -DCM resolved with EF 55% on echo 2018 -Appears euvolemic on exam today and has not required any diuretics -Continue prescription drug management with Entresto 24-26 mg twice daily, Farxiga 10 mg daily, carvedilol 6.25 mg twice daily with PRN Refills  3.  HTN -BP controlled on exam -Continue prescription drug management with Entresto 24-26 mg twice daily, carvedilol 6.25 mg twice daily with as needed refills  4.  OSA - The patient is tolerating PAP therapy well without any problems. The PAP download performed by his DME was personally reviewed and interpreted by me today and showed an AHI of 0.5 /hr on 13 cm H2O with 100 % compliance in using more than 4 hours nightly.  The patient has been using and benefiting from PAP use and will continue to benefit from therapy.   5.  ASCAD -coronary CTA 12/23 showed Minimal < 25% plaque in the mid RCA, LAD and OM1 with coronary Ca score of 1.23.  -She denies any anginal symptoms since I saw her last -Continue prescription drug management with aspirin 81 mg daily, carvedilol 6.25 mg twice daily and atorvastatin 20 mg daily with PRN Refills  6.  HLD -LDL goal < 70 -I have personally reviewed and interpreted outside labs performed by patient's PCP which showed LDL 63, HDL 45 and ALT 25 on 12/05/2022 -Continue drug managed with atorvastatin 20 mg daily and Vascepa 2gm BID with as needed refills    Medication Adjustments/Labs and Tests Ordered: Current medicines are reviewed at length with the patient today.  Concerns regarding medicines are outlined above.   Tests Ordered: No orders of the defined types were placed in this encounter.   Medication  Changes: No orders of the defined types were placed in this encounter.   Follow Up:  In Person in 1 year(s)  Signed, Armanda Magic, MD  03/02/2023 11:12 AM    [facility]

## 2023-03-02 NOTE — Addendum Note (Signed)
Addended by: Neita Goodnight B on: 03/02/2023 11:22 AM   Modules accepted: Orders

## 2023-03-02 NOTE — Addendum Note (Signed)
Addended by: Virl Axe, Aileana Hodder L on: 03/02/2023 11:25 AM   Modules accepted: Orders

## 2023-03-02 NOTE — Patient Instructions (Addendum)
Medication Instructions:  Your physician recommends that you continue on your current medications as directed. Please refer to the Current Medication list given to you today.  *If you need a refill on your cardiac medications before your next appointment, please call your pharmacy*  Lab Work: BNP  If you have labs (blood work) drawn today and your tests are completely normal, you will receive your results only by: MyChart Message (if you have MyChart) OR A paper copy in the mail If you have any lab test that is abnormal or we need to change your treatment, we will call you to review the results.  Testing/Procedures: Your physician has requested that you have an echocardiogram. Echocardiography is a painless test that uses sound waves to create images of your heart. It provides your doctor with information about the size and shape of your heart and how well your heart's chambers and valves are working. This procedure takes approximately one hour. There are no restrictions for this procedure. Please do NOT wear cologne, perfume, aftershave, or lotions (deodorant is allowed). Please arrive 15 minutes prior to your appointment time.  Please note: We ask at that you not bring children with you during ultrasound (echo/ vascular) testing. Due to room size and safety concerns, children are not allowed in the ultrasound rooms during exams. Our front office staff cannot provide observation of children in our lobby area while testing is being conducted. An adult accompanying a patient to their appointment will only be allowed in the ultrasound room at the discretion of the ultrasound technician under special circumstances. We apologize for any inconvenience.   Follow-Up: At [facility], you and your health needs are our priority.  As part of our continuing mission to provide you with exceptional heart care, we have created designated Provider Care Teams.  These Care Teams include your primary  Cardiologist (physician) and Advanced Practice Providers (APPs -  Physician Assistants and Nurse Practitioners) who all work together to provide you with the care you need, when you need it.  We recommend signing up for the patient portal called "MyChart".  Sign up information is provided on this After Visit Summary.  MyChart is used to connect with patients for Virtual Visits (Telemedicine).  Patients are able to view lab/test results, encounter notes, upcoming appointments, etc.  Non-urgent messages can be sent to your provider as well.   To learn more about what you can do with MyChart, go to [email or web address].    Your next appointment:   1 year(s)  The format for your next appointment:   In Person  Provider:   Armanda Magic, MD {  You have been referred to pulmonology.

## 2023-03-03 ENCOUNTER — Other Ambulatory Visit: Payer: Self-pay | Admitting: Family Medicine

## 2023-03-03 ENCOUNTER — Ambulatory Visit: Payer: Medicare HMO

## 2023-03-03 DIAGNOSIS — I5022 Chronic systolic (congestive) heart failure: Secondary | ICD-10-CM | POA: Diagnosis not present

## 2023-03-03 DIAGNOSIS — I110 Hypertensive heart disease with heart failure: Secondary | ICD-10-CM | POA: Diagnosis not present

## 2023-03-04 LAB — PRO B NATRIURETIC PEPTIDE: NT-Pro BNP: 43 pg/mL (ref 0–287)

## 2023-03-15 ENCOUNTER — Encounter: Payer: Self-pay | Admitting: Family Medicine

## 2023-03-15 NOTE — Telephone Encounter (Signed)
This encounter was created in error - please disregard.

## 2023-03-21 ENCOUNTER — Other Ambulatory Visit: Payer: Medicare HMO

## 2023-03-21 ENCOUNTER — Other Ambulatory Visit: Payer: Self-pay | Admitting: Family Medicine

## 2023-03-21 DIAGNOSIS — I110 Hypertensive heart disease with heart failure: Secondary | ICD-10-CM | POA: Diagnosis not present

## 2023-03-21 DIAGNOSIS — I5022 Chronic systolic (congestive) heart failure: Secondary | ICD-10-CM | POA: Diagnosis not present

## 2023-03-21 DIAGNOSIS — E1169 Type 2 diabetes mellitus with other specified complication: Secondary | ICD-10-CM | POA: Diagnosis not present

## 2023-03-21 DIAGNOSIS — E785 Hyperlipidemia, unspecified: Secondary | ICD-10-CM | POA: Diagnosis not present

## 2023-03-22 LAB — CBC WITH DIFFERENTIAL/PLATELET
Basophils Absolute: 0.1 10*3/uL (ref 0.0–0.2)
Basos: 1 %
EOS (ABSOLUTE): 0.3 10*3/uL (ref 0.0–0.4)
Eos: 5 %
Hematocrit: 42 % (ref 34.0–46.6)
Hemoglobin: 14 g/dL (ref 11.1–15.9)
Immature Grans (Abs): 0 10*3/uL (ref 0.0–0.1)
Immature Granulocytes: 0 %
Lymphocytes Absolute: 1.7 10*3/uL (ref 0.7–3.1)
Lymphs: 28 %
MCH: 30.9 pg (ref 26.6–33.0)
MCHC: 33.3 g/dL (ref 31.5–35.7)
MCV: 93 fL (ref 79–97)
Monocytes Absolute: 0.6 10*3/uL (ref 0.1–0.9)
Monocytes: 10 %
Neutrophils Absolute: 3.4 10*3/uL (ref 1.4–7.0)
Neutrophils: 56 %
Platelets: 203 10*3/uL (ref 150–450)
RBC: 4.53 x10E6/uL (ref 3.77–5.28)
RDW: 12.1 % (ref 11.7–15.4)
WBC: 6 10*3/uL (ref 3.4–10.8)

## 2023-03-22 LAB — LIPID PANEL
Chol/HDL Ratio: 3.7 {ratio} (ref 0.0–4.4)
Cholesterol, Total: 149 mg/dL (ref 100–199)
HDL: 40 mg/dL (ref >39)
LDL Chol Calc (NIH): 76 mg/dL (ref 0–99)
Triglycerides: 199 mg/dL — ABNORMAL HIGH (ref 0–149)
VLDL Cholesterol Cal: 33 mg/dL (ref 5–40)

## 2023-03-22 LAB — COMPREHENSIVE METABOLIC PANEL
ALT: 22 [IU]/L (ref 0–32)
AST: 20 [IU]/L (ref 0–40)
Albumin: 3.8 g/dL — ABNORMAL LOW (ref 3.9–4.9)
Alkaline Phosphatase: 182 [IU]/L — ABNORMAL HIGH (ref 44–121)
BUN/Creatinine Ratio: 26 (ref 12–28)
BUN: 18 mg/dL (ref 8–27)
Bilirubin Total: 0.8 mg/dL (ref 0.0–1.2)
CO2: 26 mmol/L (ref 20–29)
Calcium: 9 mg/dL (ref 8.7–10.3)
Chloride: 107 mmol/L — ABNORMAL HIGH (ref 96–106)
Creatinine, Ser: 0.69 mg/dL (ref 0.57–1.00)
Globulin, Total: 1.6 g/dL (ref 1.5–4.5)
Glucose: 103 mg/dL — ABNORMAL HIGH (ref 70–99)
Potassium: 4.5 mmol/L (ref 3.5–5.2)
Sodium: 146 mmol/L — ABNORMAL HIGH (ref 134–144)
Total Protein: 5.4 g/dL — ABNORMAL LOW (ref 6.0–8.5)
eGFR: 98 mL/min/{1.73_m2} (ref >59)

## 2023-03-22 LAB — MICROALBUMIN / CREATININE URINE RATIO
Creatinine, Urine: 114.6 mg/dL
Microalb/Creat Ratio: 6 mg/g{creat} (ref 0–29)
Microalbumin, Urine: 6.4 ug/mL

## 2023-03-22 LAB — HEMOGLOBIN A1C
Est. average glucose Bld gHb Est-mCnc: 117 mg/dL
Hgb A1c MFr Bld: 5.7 % — ABNORMAL HIGH (ref 4.8–5.6)

## 2023-03-22 NOTE — Progress Notes (Signed)
 Subjective:  Patient ID: Judy [name], female    DOB: Jun 01, 1960  Age: 63 y.o. MRN: [id number]  Chief Complaint  Patient presents with   Medical Management of Chronic Issues    HPI   Diabetes:  Complications: hyperlipidemia Glucose checking: not checking Most recent A1C: 5.7 Current medications: Farxiga  10 mg once daily, Trulicity  3 mg weekly. Last Eye Exam: Foot checks: yes Lifestyle changes: Eating fairly healthy. Unable to exercise due to back pain.    Hyperlipidemia: Current medications: lipitor 20 mg daily, vascepa , coenzyme Q 10 daily Labs reviewed. LDL at goal.    Hypertension with chf due to idiopathic cardiomyopathy. Current medications: Coreg  6.25 mg twice daily, Entresto  24-26 mg twice daily, ASA 81 mg daily   Chronic pain syndrome: lumbar back pain secondary to severe scoliosis and harrington rod complication. Taking tramadol  50 mg one to two times per week and tylenol . Has muscle relaxant. Gabapentin  800 mg THREE TIMES A DAY, Using TENS unit.   OSA/COPD: uses cpap. Compliant and benefits. On stiolto. Sees pulmonology. Continues to have shortness of breath with exertion.    Chronic Respiratory Failure with hypoxia. Wears oxygen  2 L at night and with exertion as needed daily.    Hypothyroidism -currently on synthroid  75 mcg. TSH 1.55   Depression, mild, recurrent- wellbutrin  XL 300 mg once daily in a.m. clonazepam  0.5 mg 1 twice daily as needed for panic attacks.  Patient very rarely takes this.   PHN: Failing on ibuprofen , gabapentin , tylenol , tramadol , and steroids. As well as voltaren gel. Has some relief with otc lidoderm  patches. Helps osteoarthritis spine and shoulders.  Lyrica  300 mg twice daily which has helped.    Patient is exercising more.      03/23/2023   10:51 AM 12/08/2022    3:22 PM 08/30/2022    3:41 PM 04/15/2022   10:10 AM 03/24/2022    2:05 PM  Depression screen PHQ 2/9  Decreased Interest 0 0 1 0 0  Down, Depressed, Hopeless 0 0 1 0 0   PHQ - 2 Score 0 0 2 0 0  Altered sleeping 1 2 2 3 2   Tired, decreased energy 1 1 2  0 1  Change in appetite 1 0 1 0 0  Feeling bad or failure about yourself  1 0 0 0 0  Trouble concentrating 1 1  0 2  Moving slowly or fidgety/restless 1 0 0 0 0  Suicidal thoughts 0  0 0 0  PHQ-9 Score 6 4 7 3 5   Difficult doing work/chores Not difficult at all Somewhat difficult  Not difficult at all Not difficult at all        03/23/2023   10:51 AM  Fall Risk   Falls in the past year? 0  Number falls in past yr: 0  Injury with Fall? 0  Risk for fall due to : No Fall Risks  Follow up Falls evaluation completed    Patient Care Team: Sherre Clapper, MD as PCP - General (Family Medicine) Shlomo Wilbert SAUNDERS, MD as PCP - Cardiology (Cardiology) Nyle Rankin POUR, [facility] (Inactive) (Pharmacist) Maryl Lynwood Raddle., OD (Optometry)   Review of Systems  Constitutional:  Negative for chills, fatigue and fever.  HENT:  Positive for congestion. Negative for ear pain, rhinorrhea and sore throat.   Respiratory:  Positive for shortness of breath. Negative for cough.   Cardiovascular:  Negative for chest pain.  Gastrointestinal:  Negative for abdominal pain, constipation, diarrhea, nausea and vomiting.  Genitourinary:  Negative for dysuria and urgency.  Musculoskeletal:  Negative for back pain and myalgias.  Neurological:  Negative for dizziness, weakness, light-headedness and headaches.  Psychiatric/Behavioral:  Negative for dysphoric mood. The patient is not nervous/anxious.     Current Outpatient Medications on File Prior to Visit  Medication Sig Dispense Refill   albuterol  (VENTOLIN  HFA) 108 (90 Base) MCG/ACT inhaler Inhale 2 puffs into the lungs every 6 (six) hours as needed for wheezing or shortness of breath. 8 g 2   aspirin  81 MG tablet Take 81 mg by mouth at bedtime.      atorvastatin  (LIPITOR) 20 MG tablet TAKE 1 TABLET EVERY DAY 90 tablet 3   buPROPion  (WELLBUTRIN  XL) 300 MG 24 hr tablet TAKE 1 TABLET  EVERY DAY 90 tablet 3   carvedilol  (COREG ) 6.25 MG tablet TAKE 1 TABLET TWICE DAILY 180 tablet 3   clonazePAM  (KLONOPIN ) 0.5 MG tablet Take 1 tablet (0.5 mg total) by mouth 2 (two) times daily as needed for anxiety (panic attacks). 90 tablet 0   Coenzyme Q10 (CO Q 10) 100 MG CAPS Take 200 mg by mouth daily.     diphenhydrAMINE (BENADRYL) 25 MG tablet Take 50 mg by mouth at bedtime as needed.     Dulaglutide  (TRULICITY ) 3 MG/0.5ML SOPN Inject 3 mg as directed once a week. 2 mL 0   empagliflozin (JARDIANCE) 10 MG TABS tablet Take by mouth daily.     fluticasone  (FLONASE ) 50 MCG/ACT nasal spray Place 2 sprays into both nostrils daily. 48 mL 2   ipratropium-albuterol  (DUONEB) 0.5-2.5 (3) MG/3ML SOLN Take 3 mLs by nebulization every 4 (four) hours as needed. 120 mL 0   levothyroxine  (SYNTHROID ) 75 MCG tablet TAKE 1 TABLET EVERY DAY 90 tablet 3   lidocaine  (LIDODERM ) 5 % Place 3 patches onto the skin daily. Remove & Discard patch within 12 hours or as directed by MD 90 patch 3   meloxicam  (MOBIC ) 15 MG tablet TAKE 1 TABLET EVERY DAY 90 tablet 3   Multiple Vitamin (MULTIVITAMIN WITH MINERALS) TABS tablet Take 1 tablet by mouth daily.     omeprazole  (PRILOSEC) 20 MG capsule Take 1 capsule (20 mg total) by mouth daily. 90 capsule 3   OXYGEN  2lpm 2 lpm as needed     pregabalin  (LYRICA ) 300 MG capsule Take 1 capsule (300 mg total) by mouth 2 (two) times daily. 180 capsule 1   sacubitril -valsartan  (ENTRESTO ) 24-26 MG Take 1 tablet by mouth 2 (two) times daily. 180 tablet 3   Tiotropium Bromide -Olodaterol (STIOLTO RESPIMAT ) 2.5-2.5 MCG/ACT AERS INHALE 2 PUFFS INTO THE LUNGS DAILY. 12 g 5   tiZANidine  (ZANAFLEX ) 4 MG tablet TAKE 1 TABLET EVERY 6 HOURS AS NEEDED FOR MUSCLE SPASM(S) 360 tablet 3   traMADol  (ULTRAM ) 50 MG tablet Take 1 tablet (50 mg total) by mouth every 6 (six) hours as needed for severe pain. Take one four times a day as needed for pain 360 tablet 0   UNABLE TO FIND CPAP with o2 2lpm  DME- [facility]      VASCEPA  1 g capsule TAKE 2 CAPSULES BY MOUTH TWICE A DAY 360 capsule 1   Vitamin D , Ergocalciferol , (DRISDOL ) 1.25 MG (50000 UNIT) CAPS capsule TAKE 1 CAPSULE EVERY 7 DAYS 12 capsule 3   FARXIGA  10 MG TABS tablet Take 1 tablet (10 mg total) by mouth daily. (Patient not taking: Reported on 03/23/2023) 90 tablet 0   oxyCODONE -acetaminophen  (PERCOCET/ROXICET) 5-325 MG tablet Take 1 tablet by mouth every 6 (six) hours  as needed for severe pain. (Patient not taking: Reported on 03/23/2023) 20 tablet 0   No current facility-administered medications on file prior to visit.   Past Medical History:  Diagnosis Date   Anxiety    Back pain    Bilateral swelling of feet    CAD (coronary artery disease), native coronary artery    coronary CTA 01/2022 < 25% plaque in the mid RCA, LAD and OM1 with coronary Ca score of 1.23.   Chronic combined systolic and diastolic CHF (congestive heart failure) (HCC) 10/07/2014   COPD (chronic obstructive pulmonary disease) (HCC)    DCM (dilated cardiomyopathy) (HCC) 04/25/2014   EF 28% by MRI despite maximum medical therapy   Depression    Diabetes mellitus without complication (HCC)    Epilepsy (HCC)    as a child   Fatty liver    Former tobacco use    Hyperlipidemia    Hypertension    Hypothyroidism    Joint pain    Leg swelling    Morbid obesity (HCC)    NICM (nonischemic cardiomyopathy) (HCC)    OSA (obstructive sleep apnea)    moderate with AHI 26/hr with oxygen  desaturations as low as 70%   Scoliosis    Sleep apnea    SOB (shortness of breath)    Past Surgical History:  Procedure Laterality Date   ABDOMINAL HYSTERECTOMY     ankle artery involved cyst removal     CARDIAC CATHETERIZATION  05/14/2014   normal coronary arteries   CARPAL TUNNEL RELEASE     FASCIOTOMY     1993 for platar fasciitis   harrington rod scoliosis     1981   LEFT HEART CATHETERIZATION WITH CORONARY ANGIOGRAM N/A 05/14/2014   Procedure: LEFT HEART CATHETERIZATION WITH  CORONARY ANGIOGRAM;  Surgeon: Deatrice DELENA Cage, MD;  Location: [facility] CATH [facility];  Service: Cardiovascular;  Laterality: N/A;   ROTATOR CUFF REPAIR Right 06/2021   UMBILICAL HERNIA REPAIR     VESICOVAGINAL FISTULA CLOSURE W/ TAH      Family History  Problem Relation Age of Onset   Diabetes Mother    Hyperlipidemia Mother    Obesity Mother    Emphysema Father    Allergies Father    Heart failure Father    Heart disease Father 16       MI   Diabetes Father    Hyperlipidemia Father    Hypertension Father    Thyroid  disease Father    Alcohol abuse Father    Obesity Father    Breast cancer Neg Hx    Social History   Socioeconomic History   Marital status: Married    Spouse name: Not on file   Number of children: Not on file   Years of education: Not on file   Highest education level: Some college, no degree  Occupational History   Occupation: disabled  Tobacco Use   Smoking status: Former    Current packs/day: 0.00    Average packs/day: 1 pack/day for 20.0 years (20.0 ttl pk-yrs)    Types: Cigarettes    Start date: 02/14/1985    Quit date: 02/14/2005    Years since quitting: 18.1   Smokeless tobacco: Never  Vaping Use   Vaping status: Never Used  Substance and Sexual Activity   Alcohol use: Yes    Alcohol/week: 2.0 standard drinks of alcohol    Types: 2 Shots of liquor per week    Comment: socially   Drug use: No   Sexual  activity: Not Currently  Other Topics Concern   Not on file  Social History Narrative   Lives in [place] with spouse and son.   Previously worked as a comptroller         Social Drivers of Corporate Investment Banker Strain: Low Risk  (08/26/2022)   Overall Financial Resource Strain (CARDIA)    Difficulty of Paying Living Expenses: Not very hard  Recent Concern: Financial Resource Strain - High Risk (06/23/2022)   Overall Financial Resource Strain (CARDIA)    Difficulty of Paying Living Expenses: Very hard  Food Insecurity: No Food  Insecurity (08/26/2022)   Hunger Vital Sign    Worried About Running Out of Food in the Last Year: Never true    Ran Out of Food in the Last Year: Never true  Transportation Needs: No Transportation Needs (08/26/2022)   PRAPARE - Administrator, Civil Service (Medical): No    Lack of Transportation (Non-Medical): No  Physical Activity: Insufficiently Active (08/26/2022)   Exercise Vital Sign    Days of Exercise per Week: 2 days    Minutes of Exercise per Session: 20 min  Stress: No Stress Concern Present (08/26/2022)   [employer] of Occupational Health - Occupational Stress Questionnaire    Feeling of Stress : Only a little  Social Connections: Socially Isolated (08/26/2022)   Social Connection and Isolation Panel [NHANES]    Frequency of Communication with Friends and Family: Once a week    Frequency of Social Gatherings with Friends and Family: Once a week    Attends Religious Services: Never    Diplomatic Services Operational Officer: No    Attends Engineer, Structural: Never    Marital Status: Married    Objective:  BP 118/72 (BP Location: Left Arm, Patient Position: Sitting)   Pulse 71   Temp (!) 97 F (36.1 C) (Temporal)   Ht 5' 5 (1.651 m)   Wt 264 lb (119.7 kg)   SpO2 94%   BMI 43.93 kg/m      03/23/2023   10:50 AM 03/02/2023   11:03 AM 12/08/2022    3:26 PM  BP/Weight  Systolic BP 118 138 116  Diastolic BP 72 90 72  Wt. (Lbs) 264 263.8 254  BMI 43.93 kg/m2 43.9 kg/m2 42.27 kg/m2    Physical Exam Vitals reviewed.  Constitutional:      Appearance: Normal appearance. She is normal weight.  HENT:     Right Ear: Tympanic membrane normal.     Left Ear: Tympanic membrane normal.     Nose: Congestion present.     Right Turbinates: Enlarged and swollen.     Left Turbinates: Enlarged and swollen.     Right Sinus: Maxillary sinus tenderness present.     Left Sinus: Maxillary sinus tenderness present.     Comments: Maxillary sinus  tenderness    Mouth/Throat:     Pharynx: No oropharyngeal exudate or posterior oropharyngeal erythema.  Neck:     Vascular: No carotid bruit.  Cardiovascular:     Rate and Rhythm: Normal rate and regular rhythm.     Heart sounds: Normal heart sounds.  Pulmonary:     Effort: Pulmonary effort is normal. No respiratory distress.     Breath sounds: Normal breath sounds.  Abdominal:     General: Abdomen is flat. Bowel sounds are normal.     Palpations: Abdomen is soft.     Tenderness: There is no abdominal tenderness.  Neurological:     Mental Status: She is alert and oriented to person, place, and time.  Psychiatric:        Mood and Affect: Mood normal.        Behavior: Behavior normal.     Diabetic Foot Exam - Simple   Simple Foot Form  03/23/2023  9:20 PM  Visual Inspection No deformities, no ulcerations, no other skin breakdown bilaterally: Yes Sensation Testing Intact to touch and monofilament testing bilaterally: Yes Pulse Check Posterior Tibialis and Dorsalis pulse intact bilaterally: Yes Comments      Lab Results  Component Value Date   WBC 6.0 03/21/2023   HGB 14.0 03/21/2023   HCT 42.0 03/21/2023   PLT 203 03/21/2023   GLUCOSE 103 (H) 03/21/2023   CHOL 149 03/21/2023   TRIG 199 (H) 03/21/2023   HDL 40 03/21/2023   LDLCALC 76 03/21/2023   ALT 22 03/21/2023   AST 20 03/21/2023   NA 146 (H) 03/21/2023   K 4.5 03/21/2023   CL 107 (H) 03/21/2023   CREATININE 0.69 03/21/2023   BUN 18 03/21/2023   CO2 26 03/21/2023   TSH 1.550 08/26/2022   INR 1.0 05/13/2014   HGBA1C 5.7 (H) 03/21/2023   MICROALBUR Negative 09/03/2019      Assessment & Plan:    Hypertensive heart disease with chronic systolic congestive heart failure (HCC) Assessment & Plan: Well controlled.  No changes to medicines. Continue farxiga  10 mg daily, Coreg  6.25 mg twice daily, Entresto  24-26 mg twice daily, ASA 81 mg daily Continue to work on eating a healthy diet and exercise.  Labs  reviewed.    Hyperlipidemia associated with type 2 diabetes mellitus (HCC) Assessment & Plan: Diabetes and cholesterol at goal Recommend check feet daily. Recommend annual eye exams. Medicines: Continue Farxiga  10 mg once daily, Trulicity  3 mg once daily, atorvastatin  20 mg before bed, vascepa  1 gm 2 capsules twice daily, and coenzyme q10.  Continue to work on eating a healthy diet and exercise.  Labs reviewed   Mixed hyperlipidemia Assessment & Plan: Well controlled.  No changes to medicines. lipitor 20 mg daily, vascepa , coenzyme Q 10 daily  Continue to work on eating a healthy diet and exercise.     Other specified hypothyroidism Assessment & Plan: Previously well controlled Continue Synthroid  at current dose     Morbid obesity due to excess calories [facility]) Assessment & Plan: Recommend continue to work on eating healthy diet and exercise.    Need for vaccination -     Pneumococcal conjugate vaccine 20-valent  Acute non-recurrent maxillary sinusitis -     Amoxicillin -Pot Clavulanate; Take 1 tablet by mouth 2 (two) times daily.  Dispense: 20 tablet; Refill: 0  Chronic respiratory failure with hypoxia (HCC) Assessment & Plan: Continue oxygen  2 L at night and with CPAP.  Oxygen  as needed during the day.    OSA (obstructive sleep apnea) Assessment & Plan: Continue cpap. Compliant and benefiting.    COPD GOLD  III  Assessment & Plan: Stable Continue stiolto. Continue albuterol  or duoneb as needed       Meds ordered this encounter  Medications   amoxicillin -clavulanate (AUGMENTIN ) 875-125 MG tablet    Sig: Take 1 tablet by mouth 2 (two) times daily.    Dispense:  20 tablet    Refill:  0    Orders Placed This Encounter  Procedures   Pneumococcal conjugate vaccine 20-valent     Follow-up: Return in about 3 months (around 06/20/2023) for chronic  follow up.   I,Katherina A Bramblett,acting as a scribe for Abigail Free, MD.,have documented all relevant  documentation on the behalf of Abigail Free, MD,as directed by  Abigail Free, MD while in the presence of Abigail Free, MD.   LILLETTE Kato I Leal-Borjas,acting as a scribe for Abigail Free, MD.,have documented all relevant documentation on the behalf of Abigail Free, MD,as directed by  Abigail Free, MD while in the presence of Abigail Free, MD.    An After Visit Summary was printed and given to the patient.  I attest that I have reviewed this visit and agree with the plan scribed by my staff.   Abigail Free, MD [facility] [phone]

## 2023-03-23 ENCOUNTER — Encounter: Payer: Self-pay | Admitting: Family Medicine

## 2023-03-23 ENCOUNTER — Ambulatory Visit: Payer: Medicare HMO | Admitting: Family Medicine

## 2023-03-23 VITALS — BP 118/72 | HR 71 | Temp 97.0°F | Ht 65.0 in | Wt 264.0 lb

## 2023-03-23 DIAGNOSIS — I110 Hypertensive heart disease with heart failure: Secondary | ICD-10-CM | POA: Diagnosis not present

## 2023-03-23 DIAGNOSIS — E785 Hyperlipidemia, unspecified: Secondary | ICD-10-CM

## 2023-03-23 DIAGNOSIS — E782 Mixed hyperlipidemia: Secondary | ICD-10-CM

## 2023-03-23 DIAGNOSIS — I5022 Chronic systolic (congestive) heart failure: Secondary | ICD-10-CM | POA: Diagnosis not present

## 2023-03-23 DIAGNOSIS — J449 Chronic obstructive pulmonary disease, unspecified: Secondary | ICD-10-CM

## 2023-03-23 DIAGNOSIS — Z23 Encounter for immunization: Secondary | ICD-10-CM

## 2023-03-23 DIAGNOSIS — J0100 Acute maxillary sinusitis, unspecified: Secondary | ICD-10-CM | POA: Diagnosis not present

## 2023-03-23 DIAGNOSIS — G4733 Obstructive sleep apnea (adult) (pediatric): Secondary | ICD-10-CM

## 2023-03-23 DIAGNOSIS — E1169 Type 2 diabetes mellitus with other specified complication: Secondary | ICD-10-CM | POA: Diagnosis not present

## 2023-03-23 DIAGNOSIS — E038 Other specified hypothyroidism: Secondary | ICD-10-CM

## 2023-03-23 DIAGNOSIS — J9611 Chronic respiratory failure with hypoxia: Secondary | ICD-10-CM

## 2023-03-23 MED ORDER — AMOXICILLIN-POT CLAVULANATE 875-125 MG PO TABS: 1.0000 | ORAL_TABLET | Freq: Two times a day (BID) | ORAL | 0 refills | Status: DC

## 2023-03-23 NOTE — Patient Instructions (Addendum)
Augmentin for sinus infection

## 2023-03-24 ENCOUNTER — Ambulatory Visit (HOSPITAL_COMMUNITY): Payer: Medicare HMO | Attending: Cardiology

## 2023-03-24 DIAGNOSIS — I2510 Atherosclerotic heart disease of native coronary artery without angina pectoris: Secondary | ICD-10-CM | POA: Diagnosis not present

## 2023-03-24 DIAGNOSIS — I10 Essential (primary) hypertension: Secondary | ICD-10-CM | POA: Insufficient documentation

## 2023-03-24 DIAGNOSIS — I2583 Coronary atherosclerosis due to lipid rich plaque: Secondary | ICD-10-CM | POA: Diagnosis not present

## 2023-03-24 DIAGNOSIS — I420 Dilated cardiomyopathy: Secondary | ICD-10-CM | POA: Diagnosis not present

## 2023-03-24 DIAGNOSIS — I5032 Chronic diastolic (congestive) heart failure: Secondary | ICD-10-CM | POA: Insufficient documentation

## 2023-03-24 DIAGNOSIS — E7800 Pure hypercholesterolemia, unspecified: Secondary | ICD-10-CM | POA: Diagnosis not present

## 2023-03-24 DIAGNOSIS — G4733 Obstructive sleep apnea (adult) (pediatric): Secondary | ICD-10-CM | POA: Insufficient documentation

## 2023-03-24 LAB — ECHOCARDIOGRAM COMPLETE
Area-P 1/2: 4.21 cm2
Calc EF: 62.6 %
Est EF: 55
S' Lateral: 2.98 cm
Single Plane A2C EF: 61.8 %
Single Plane A4C EF: 62 %

## 2023-03-24 MED ORDER — PERFLUTREN LIPID MICROSPHERE
1.0000 mL | INTRAVENOUS | Status: AC | PRN
  Administered 2023-03-24: 2.5 mL via INTRAVENOUS

## 2023-03-25 DIAGNOSIS — E782 Mixed hyperlipidemia: Secondary | ICD-10-CM | POA: Insufficient documentation

## 2023-03-25 DIAGNOSIS — Z23 Encounter for immunization: Secondary | ICD-10-CM | POA: Insufficient documentation

## 2023-03-25 NOTE — Assessment & Plan Note (Signed)
Continue cpap. Compliant and benefiting.

## 2023-03-25 NOTE — Assessment & Plan Note (Signed)
Diabetes and cholesterol at goal Recommend check feet daily. Recommend annual eye exams. Medicines: Continue Farxiga 10 mg once daily, Trulicity 3 mg once daily, atorvastatin 20 mg before bed, vascepa 1 gm 2 capsules twice daily, and coenzyme q10.  Continue to work on eating a healthy diet and exercise.  Labs reviewed

## 2023-03-25 NOTE — Assessment & Plan Note (Signed)
Well controlled.  No changes to medicines. Continue farxiga 10 mg daily, Coreg 6.25 mg twice daily, Entresto 24-26 mg twice daily, ASA 81 mg daily Continue to work on eating a healthy diet and exercise.  Labs reviewed.

## 2023-03-25 NOTE — Assessment & Plan Note (Signed)
Continue oxygen 2 L at night and with CPAP.  Oxygen as needed during the day.

## 2023-03-25 NOTE — Assessment & Plan Note (Signed)
 Well controlled.  No changes to medicines. lipitor 20 mg daily, vascepa , coenzyme Q 10 daily  Continue to work on eating a healthy diet and exercise.

## 2023-03-25 NOTE — Assessment & Plan Note (Signed)
 Recommend continue to work on eating healthy diet and exercise.

## 2023-03-25 NOTE — Assessment & Plan Note (Signed)
 Stable Continue stiolto. Continue albuterol  or duoneb as needed

## 2023-03-25 NOTE — Assessment & Plan Note (Signed)
 Previously well controlled Continue Synthroid at current dose

## 2023-03-26 ENCOUNTER — Encounter: Payer: Self-pay | Admitting: Family Medicine

## 2023-03-28 ENCOUNTER — Other Ambulatory Visit: Payer: Self-pay

## 2023-03-28 ENCOUNTER — Telehealth: Payer: Self-pay

## 2023-03-28 DIAGNOSIS — J418 Mixed simple and mucopurulent chronic bronchitis: Secondary | ICD-10-CM

## 2023-03-28 DIAGNOSIS — J9611 Chronic respiratory failure with hypoxia: Secondary | ICD-10-CM

## 2023-03-28 DIAGNOSIS — G4733 Obstructive sleep apnea (adult) (pediatric): Secondary | ICD-10-CM

## 2023-03-28 DIAGNOSIS — I420 Dilated cardiomyopathy: Secondary | ICD-10-CM

## 2023-03-28 NOTE — Telephone Encounter (Signed)
-----   Message from Armanda Magic sent at 03/27/2023  8:39 AM EST ----- Regarding: RE: V/Q scan, labs. No my nurse is going to order all this ----- Message ----- From: Blane Ohara, MD Sent: 03/26/2023   9:14 PM EST To: Quintella Reichert, MD; Luellen Pucker, RN Subject: V/Q scan, labs.                                Did you want me to order this or are you just letting me know? Whichever you would like, I am very happy to do, but just wanted to clarify. Thanks, Blane Ohara, MD ----- Message ----- From: Quintella Reichert, MD Sent: 03/26/2023   8:43 PM EST To: Blane Ohara, MD; Luellen Pucker, RN  Echo showed normal pumping function of the heart with EF 55%, mildly decreased function of the right ventricle suspect related to morbid obesity and OSA.    Also read out as dilated aortic root but when indexed to body surface area it is normal  Please get an ONO on CPAP.  Repeat PFTs with [facility].  Get VQ scan to rule out chronic thromboembolic disease.  Check ESR, CRP, ANA, RF

## 2023-03-28 NOTE — Telephone Encounter (Signed)
Call to patient to advise that per Dr. Mayford Knife: Echo showed normal pumping function of the heart with EF 55%, mildly decreased function of the right ventricle suspect related to morbid obesity and OSA.      Also read out as dilated aortic root but when indexed to body surface area it is normal    Please get an ONO on CPAP.  Repeat PFTs with DLCO.  Get VQ scan to rule out  chronic thromboembolic disease.  Check ESR, CRP, ANA, RF    Patient agrees to testing, orders placed.

## 2023-04-03 NOTE — Addendum Note (Signed)
Addended by: Luellen Pucker on: 04/03/2023 10:55 AM   Modules accepted: Orders

## 2023-04-07 ENCOUNTER — Telehealth: Payer: Self-pay | Admitting: *Deleted

## 2023-04-07 NOTE — Telephone Encounter (Signed)
Order placed for Please complete ONO with patient on cpap and O2. Order placed to [facility].

## 2023-04-11 NOTE — Telephone Encounter (Signed)
 Order placed for Please complete ONO with patient on cpap and O2.  Order placed to [facility] does not take [employer].

## 2023-04-12 ENCOUNTER — Other Ambulatory Visit: Payer: Self-pay | Admitting: Family Medicine

## 2023-04-17 ENCOUNTER — Encounter: Payer: Self-pay | Admitting: Family Medicine

## 2023-04-17 ENCOUNTER — Ambulatory Visit (INDEPENDENT_AMBULATORY_CARE_PROVIDER_SITE_OTHER): Payer: Medicare HMO | Admitting: Family Medicine

## 2023-04-17 VITALS — BP 137/78 | Ht 65.0 in | Wt 264.0 lb

## 2023-04-17 DIAGNOSIS — Z0000 Encounter for general adult medical examination without abnormal findings: Secondary | ICD-10-CM | POA: Diagnosis not present

## 2023-04-17 DIAGNOSIS — Z1231 Encounter for screening mammogram for malignant neoplasm of breast: Secondary | ICD-10-CM | POA: Diagnosis not present

## 2023-04-17 DIAGNOSIS — Z9189 Other specified personal risk factors, not elsewhere classified: Secondary | ICD-10-CM | POA: Insufficient documentation

## 2023-04-17 NOTE — Progress Notes (Signed)
 Subjective:   Judy [name] is a 63 y.o. female who presents for Medicare Annual (Subsequent) preventive examination.  Visit Complete: Virtual I connected with  Judy [name] on 04/17/23 by a audio enabled telemedicine application and verified that I am speaking with the correct person using two identifiers.  Patient Location: Home  Provider Location: Home Office  I discussed the limitations of evaluation and management by telemedicine. The patient expressed understanding and agreed to proceed.  Vital Signs: Because this visit was a virtual/telehealth visit, some criteria may be missing or patient reported. Any vitals not documented were not able to be obtained and vitals that have been documented are patient reported.  Patient Medicare AWV questionnaire was completed by the patient on 04/17/23; I have confirmed that all information answered by patient is correct and no changes since this date.  Cardiac Risk Factors include: obesity (BMI >30kg/m2);dyslipidemia;hypertension;advanced age (>61men, >89 women);diabetes mellitus;sedentary lifestyle;family history of premature cardiovascular disease;smoking/ tobacco exposure     Objective:    Today's Vitals   04/17/23 1428  BP: 137/78  Weight: 264 lb (119.7 kg)  Height: 5\' 5"  (1.651 m)   Body mass index is 43.93 kg/m.     04/17/2023    2:38 PM 04/15/2022   10:09 AM 02/27/2021   11:26 AM 01/26/2021    2:45 PM 09/24/2018    5:08 PM 01/29/2015    8:33 PM 11/17/2014    8:54 PM  Advanced Directives  Does Patient Have a Medical Advance Directive? Yes Yes No Yes No No No  Type of Estate agent of [place];Living will Healthcare Power of [place];Living will  Healthcare Power of [place];Living will     Does patient want to make changes to medical advance directive?    No - Patient declined     Copy of Healthcare Power of Attorney in Chart? No - copy requested No - copy requested  No - copy requested     Would  patient like information on creating a medical advance directive?   No - Patient declined  No - Patient declined No - patient declined information Yes - Educational materials given    Current Medications (verified) Outpatient Encounter Medications as of 04/17/2023  Medication Sig   albuterol (VENTOLIN HFA) 108 (90 Base) MCG/ACT inhaler Inhale 2 puffs into the lungs every 6 (six) hours as needed for wheezing or shortness of breath.   amoxicillin-clavulanate (AUGMENTIN) 875-125 MG tablet Take 1 tablet by mouth 2 (two) times daily.   aspirin 81 MG tablet Take 81 mg by mouth at bedtime.    atorvastatin (LIPITOR) 20 MG tablet TAKE 1 TABLET EVERY DAY   buPROPion (WELLBUTRIN XL) 300 MG 24 hr tablet TAKE 1 TABLET EVERY DAY   carvedilol (COREG) 6.25 MG tablet TAKE 1 TABLET TWICE DAILY   clonazePAM (KLONOPIN) 0.5 MG tablet Take 1 tablet (0.5 mg total) by mouth 2 (two) times daily as needed for anxiety (panic attacks).   Coenzyme Q10 (CO Q 10) 100 MG CAPS Take 200 mg by mouth daily.   diphenhydrAMINE (BENADRYL) 25 MG tablet Take 50 mg by mouth at bedtime as needed.   Dulaglutide (TRULICITY) 3 MG/0.5ML SOPN Inject 3 mg as directed once a week.   empagliflozin (JARDIANCE) 10 MG TABS tablet Take by mouth daily.   FARXIGA 10 MG TABS tablet Take 1 tablet (10 mg total) by mouth daily. (Patient not taking: Reported on 03/23/2023)   fluticasone (FLONASE) 50 MCG/ACT nasal spray Place 2 sprays into both  nostrils daily.   ipratropium-albuterol (DUONEB) 0.5-2.5 (3) MG/3ML SOLN INHALE THE CONTENTS OF 1 VIAL VIA NEBULIZER EVERY 4 (FOUR) HOURS AS NEEDED.   levothyroxine (SYNTHROID) 75 MCG tablet TAKE 1 TABLET EVERY DAY   lidocaine (LIDODERM) 5 % Place 3 patches onto the skin daily. Remove & Discard patch within 12 hours or as directed by MD   meloxicam (MOBIC) 15 MG tablet TAKE 1 TABLET EVERY DAY   Multiple Vitamin (MULTIVITAMIN WITH MINERALS) TABS tablet Take 1 tablet by mouth daily.   omeprazole (PRILOSEC) 20 MG capsule  Take 1 capsule (20 mg total) by mouth daily.   oxyCODONE-acetaminophen (PERCOCET/ROXICET) 5-325 MG tablet Take 1 tablet by mouth every 6 (six) hours as needed for severe pain. (Patient not taking: Reported on 03/23/2023)   OXYGEN 2lpm 2 lpm as needed   pregabalin (LYRICA) 300 MG capsule Take 1 capsule (300 mg total) by mouth 2 (two) times daily.   sacubitril-valsartan (ENTRESTO) 24-26 MG Take 1 tablet by mouth 2 (two) times daily.   Tiotropium Bromide-Olodaterol (STIOLTO RESPIMAT) 2.5-2.5 MCG/ACT AERS INHALE 2 PUFFS INTO THE LUNGS DAILY.   tiZANidine (ZANAFLEX) 4 MG tablet TAKE 1 TABLET EVERY 6 HOURS AS NEEDED FOR MUSCLE SPASM(S)   traMADol (ULTRAM) 50 MG tablet Take 1 tablet (50 mg total) by mouth every 6 (six) hours as needed for severe pain. Take one four times a day as needed for pain   UNABLE TO FIND CPAP with o2 2lpm  DME- [facility]   VASCEPA 1 g capsule TAKE 2 CAPSULES BY MOUTH TWICE A DAY   Vitamin D, Ergocalciferol, (DRISDOL) 1.25 MG (50000 UNIT) CAPS capsule TAKE 1 CAPSULE EVERY 7 DAYS   No facility-administered encounter medications on file as of 04/17/2023.    Allergies (verified) Meperidine   History: Past Medical History:  Diagnosis Date   Anxiety    Back pain    Bilateral swelling of feet    CAD (coronary artery disease), native coronary artery    coronary CTA 01/2022 < 25% plaque in the mid RCA, LAD and OM1 with coronary Ca score of 1.23.   Chronic combined systolic and diastolic CHF (congestive heart failure) (HCC) 10/07/2014   COPD (chronic obstructive pulmonary disease) (HCC)    DCM (dilated cardiomyopathy) (HCC) 04/25/2014   EF 28% by MRI despite maximum medical therapy   Depression    Diabetes mellitus without complication (HCC)    Epilepsy (HCC)    as a child   Fatty liver    Former tobacco use    Hyperlipidemia    Hypertension    Hypothyroidism    Joint pain    Leg swelling    Morbid obesity (HCC)    NICM (nonischemic cardiomyopathy) (HCC)    OSA (obstructive  sleep apnea)    moderate with AHI 26/hr with oxygen desaturations as low as 70%   Scoliosis    Sleep apnea    SOB (shortness of breath)    Past Surgical History:  Procedure Laterality Date   ABDOMINAL HYSTERECTOMY     ankle artery involved cyst removal     CARDIAC CATHETERIZATION  05/14/2014   normal coronary arteries   CARPAL TUNNEL RELEASE     FASCIOTOMY     1993 for platar fasciitis   harrington rod scoliosis     1981   LEFT HEART CATHETERIZATION WITH CORONARY ANGIOGRAM N/A 05/14/2014   Procedure: LEFT HEART CATHETERIZATION WITH CORONARY ANGIOGRAM;  Surgeon: Iran Ouch, MD;  Location: [facility];  Service: Cardiovascular;  Laterality: N/A;   ROTATOR CUFF REPAIR Right 06/2021   UMBILICAL HERNIA REPAIR     VESICOVAGINAL FISTULA CLOSURE W/ TAH     Family History  Problem Relation Age of Onset   Diabetes Mother    Hyperlipidemia Mother    Obesity Mother    Emphysema Father    Allergies Father    Heart failure Father    Heart disease Father 40       MI   Diabetes Father    Hyperlipidemia Father    Hypertension Father    Thyroid disease Father    Alcohol abuse Father    Obesity Father    Breast cancer Neg Hx    Social History   Socioeconomic History   Marital status: Married    Spouse name: Not on file   Number of children: Not on file   Years of education: Not on file   Highest education level: Some college, no degree  Occupational History   Occupation: disabled  Tobacco Use   Smoking status: Former    Current packs/day: 0.00    Average packs/day: 1 pack/day for 20.0 years (20.0 ttl pk-yrs)    Types: Cigarettes    Start date: 02/14/1985    Quit date: 02/14/2005    Years since quitting: 18.1   Smokeless tobacco: Never  Vaping Use   Vaping status: Never Used  Substance and Sexual Activity   Alcohol use: Yes    Alcohol/week: 2.0 standard drinks of alcohol    Types: 2 Shots of liquor per week    Comment: socially   Drug use: No   Sexual activity: Not  Currently  Other Topics Concern   Not on file  Social History Narrative   Lives in [place] with spouse and son.   Previously worked as a Comptroller         Social Drivers of Corporate investment banker Strain: Low Risk  (08/26/2022)   Overall Financial Resource Strain (CARDIA)    Difficulty of Paying Living Expenses: Not very hard  Recent Concern: Financial Resource Strain - High Risk (06/23/2022)   Overall Financial Resource Strain (CARDIA)    Difficulty of Paying Living Expenses: Very hard  Food Insecurity: No Food Insecurity (08/26/2022)   Hunger Vital Sign    Worried About Running Out of Food in the Last Year: Never true    Ran Out of Food in the Last Year: Never true  Transportation Needs: No Transportation Needs (08/26/2022)   PRAPARE - Administrator, Civil Service (Medical): No    Lack of Transportation (Non-Medical): No  Physical Activity: Insufficiently Active (08/26/2022)   Exercise Vital Sign    Days of Exercise per Week: 2 days    Minutes of Exercise per Session: 20 min  Stress: No Stress Concern Present (08/26/2022)   [employer] of Occupational Health - Occupational Stress Questionnaire    Feeling of Stress : Only a little  Social Connections: Socially Isolated (08/26/2022)   Social Connection and Isolation Panel [NHANES]    Frequency of Communication with Friends and Family: Once a week    Frequency of Social Gatherings with Friends and Family: Once a week    Attends Religious Services: Never    Database administrator or Organizations: No    Attends Engineer, structural: Not on file    Marital Status: Married    Tobacco Counseling Counseling given: Not Answered   Clinical Intake:  Pre-visit preparation completed: No  Pain : No/denies pain     BMI - recorded: 43.93 Nutritional Status: BMI > 30  Obese Nutritional Risks: None Diabetes: Yes CBG done?: No Did pt. bring in CBG monitor from home?: No  How  often do you need to have someone help you when you read instructions, pamphlets, or other written materials from your doctor or pharmacy?: 1 - Never  Interpreter Needed?: No      Activities of Daily Living    04/17/2023    2:32 PM 07/15/2022    5:22 PM  In your present state of health, do you have any difficulty performing the following activities:  Hearing? 1 1  Vision? 0 1  Difficulty concentrating or making decisions? 1 1  Walking or climbing stairs? 1 1  Dressing or bathing? 0 0  Doing errands, shopping? 0 1  Preparing Food and eating ? N N  Using the Toilet? N N  In the past six months, have you accidently leaked urine? Y Y  Do you have problems with loss of bowel control? N N  Managing your Medications? N N  Managing your Finances? N N  Housekeeping or managing your Housekeeping? Malvin Johns  Comment becoming more of a challenge lately     Patient Care Team: Blane Ohara, MD as PCP - General (Family Medicine) Quintella Reichert, MD as PCP - Cardiology (Cardiology) Zettie Pho, [facility] (Inactive) (Pharmacist) Aurelio Jew., OD (Optometry)  Indicate any recent Medical Services you may have received from other than [facility] providers in the past year (date may be approximate).     Assessment:   This is a routine wellness examination for Tyshawn.  Hearing/Vision screen No results found.   Goals Addressed             This Visit's Progress    Set My Weight Loss Goal       Follow Up Date 04/17/2023   - set weight loss goal    Why is this important?   Losing only 5 to 15 percent of your weight makes a big difference in your health.    Notes: 5 pounds/month      Depression Screen    03/23/2023   10:51 AM 12/08/2022    3:22 PM 08/30/2022    3:41 PM 04/15/2022   10:10 AM 03/24/2022    2:05 PM 03/24/2022    2:04 PM 12/16/2021    3:38 PM  PHQ 2/9 Scores  PHQ - 2 Score 0 0 2 0 0 0 0  PHQ- 9 Score 6 4 7 3 5   0    Fall Risk    03/23/2023   10:51 AM 08/30/2022    3:39 PM  07/15/2022    5:22 PM 04/15/2022   10:10 AM 03/24/2022    2:04 PM  Fall Risk   Falls in the past year? 0 0 0 0 0  Number falls in past yr: 0 0  0 0  Injury with Fall? 0   0 0  Risk for fall due to : No Fall Risks No Fall Risks  History of fall(s);Impaired balance/gait No Fall Risks  Follow up Falls evaluation completed Falls evaluation completed  Falls prevention discussed Falls evaluation completed    MEDICARE RISK AT HOME: Medicare Risk at Home Any stairs in or around the home?: Yes If so, are there any without handrails?: Yes Home free of loose throw rugs in walkways, pet beds, electrical cords, etc?: Yes Adequate lighting in your home to reduce  risk of falls?: Yes Life alert?: No Use of a cane, walker or w/c?: Yes Grab bars in the bathroom?: Yes Shower chair or bench in shower?: Yes Elevated toilet seat or a handicapped toilet?: Yes  TIMED UP AND GO:  Was the test performed?  No    Cognitive Function:        04/15/2022   10:15 AM 01/26/2021    2:47 PM  6CIT Screen  What Year? 0 points 0 points  What month? 0 points 0 points  What time? 0 points 0 points  Count back from 20 0 points 0 points  Months in reverse 0 points 0 points  Repeat phrase 0 points 0 points  Total Score 0 points 0 points    Immunizations Immunization History  Administered Date(s) Administered   Influenza Inj Mdck Quad Pf 01/13/2020, 10/15/2020, 12/16/2021   Influenza Split 10/15/2013, 11/24/2014   Influenza, Mdck, Trivalent,PF 6+ MOS(egg free) 10/19/2022   Influenza-Unspecified 11/14/2012, 11/04/2018   Moderna Covid-19 Vaccine Bivalent Booster 82yrs & up 10/17/2022   PFIZER(Purple Top)SARS-COV-2 Vaccination 05/09/2019, 06/03/2019, 11/14/2019   PNEUMOCOCCAL CONJUGATE-20 03/23/2023   Pfizer Covid-19 Vaccine Bivalent Booster 29yrs & up 11/10/2020   Pfizer(Comirnaty)Fall Seasonal Vaccine 12 years and older 12/16/2021   Pneumococcal Polysaccharide-23 12/05/2013, 11/28/2014    Pneumococcal-Unspecified 10/15/2013   Respiratory Syncytial Virus Vaccine,Recomb Aduvanted(Arexvy) 05/09/2022   Tdap 09/30/2015    TDAP status: Up to date  Flu Vaccine status: Up to date  Pneumococcal vaccine status: Up to date  Covid-19 vaccine status: Completed vaccines  Qualifies for Shingles Vaccine? No   Zostavax completed No   Shingrix Completed?: No.    Education has been provided regarding the importance of this vaccine. Patient has been advised to call insurance company to determine out of pocket expense if they have not yet received this vaccine. Advised may also receive vaccine at local pharmacy or Health Dept. Verbalized acceptance and understanding.  Screening Tests Health Maintenance  Topic Date Due   DEXA SCAN  Never done   Hepatitis C Screening  Never done   Zoster Vaccines- Shingrix (1 of 2) Never done   COVID-19 Vaccine (7 - 2024-25 season) 12/12/2022   OPHTHALMOLOGY EXAM  06/07/2023   MAMMOGRAM  06/10/2023   HEMOGLOBIN A1C  09/18/2023   FOOT EXAM  12/08/2023   Diabetic kidney evaluation - eGFR measurement  03/20/2024   Diabetic kidney evaluation - Urine ACR  03/20/2024   Medicare Annual Wellness (AWV)  04/16/2024   DTaP/Tdap/Td (2 - Td or Tdap) 09/29/2025   Colonoscopy  01/19/2032   Pneumococcal Vaccine 76-63 Years old  Completed   INFLUENZA VACCINE  Completed   HPV VACCINES  Aged Out   HIV Screening  Discontinued    Health Maintenance  Health Maintenance Due  Topic Date Due   DEXA SCAN  Never done   Hepatitis C Screening  Never done   Zoster Vaccines- Shingrix (1 of 2) Never done   COVID-19 Vaccine (7 - 2024-25 season) 12/12/2022    Colorectal cancer screening: Type of screening: Colonoscopy. Completed 01/18/2022. Repeat every 10 years  Mammogram status: Completed 06/10/22. Repeat every year  Bone Density status: Ordered 04/17/23. Pt provided with contact info and advised to call to schedule appt.  Lung Cancer Screening: (Low Dose CT Chest  recommended if Age 18-80 years, 20 pack-year currently smoking OR have quit w/in 15years.) does not qualify.   Additional Screening:  Hepatitis C Screening: does qualify; NA  Vision Screening: Recommended annual ophthalmology exams for early detection  of glaucoma and other disorders of the eye. Is the patient up to date with their annual eye exam?  Yes  Who is the provider or what is the name of the office in which the patient attends annual eye exams? [facility] If pt is not established with a provider, would they like to be referred to a provider to establish care? No .   Dental Screening: Recommended annual dental exams for proper oral hygiene  Diabetic Foot Exam: Diabetic Foot Exam: Completed 03/23/23  Community Resource Referral / Chronic Care Management: CRR required this visit?  No   CCM required this visit?  No     Plan:     I have personally reviewed and noted the following in the patient's chart:   Medical and social history Use of alcohol, tobacco or illicit drugs  Current medications and supplements including opioid prescriptions. Patient is not currently taking opioid prescriptions. Functional ability and status Nutritional status Physical activity Advanced directives List of other physicians Hospitalizations, surgeries, and ER visits in previous 12 months Vitals Screenings to include cognitive, depression, and falls Referrals and appointments  In addition, I have reviewed and discussed with patient certain preventive protocols, quality metrics, and best practice recommendations. A written personalized care plan for preventive services as well as general preventive health recommendations were provided to patient.     Lajuana Matte, FNP [facility] [phone]   04/17/2023   After Visit Summary: (Declined) Due to this being a telephonic visit, with patients personalized plan was offered to patient but patient Declined AVS at this time

## 2023-04-24 ENCOUNTER — Ambulatory Visit (HOSPITAL_BASED_OUTPATIENT_CLINIC_OR_DEPARTMENT_OTHER)
Admission: RE | Admit: 2023-04-24 | Discharge: 2023-04-24 | Disposition: A | Discharge status: Home / Self Care | Source: Ambulatory Visit | Attending: Family Medicine | Admitting: Family Medicine

## 2023-04-24 DIAGNOSIS — M85852 Other specified disorders of bone density and structure, left thigh: Secondary | ICD-10-CM | POA: Diagnosis not present

## 2023-04-24 DIAGNOSIS — Z9189 Other specified personal risk factors, not elsewhere classified: Secondary | ICD-10-CM

## 2023-05-01 DIAGNOSIS — G4730 Sleep apnea, unspecified: Secondary | ICD-10-CM | POA: Diagnosis not present

## 2023-05-01 DIAGNOSIS — R0902 Hypoxemia: Secondary | ICD-10-CM | POA: Diagnosis not present

## 2023-05-04 ENCOUNTER — Other Ambulatory Visit: Payer: Self-pay | Admitting: Family Medicine

## 2023-05-04 DIAGNOSIS — E7849 Other hyperlipidemia: Secondary | ICD-10-CM

## 2023-05-11 ENCOUNTER — Other Ambulatory Visit (HOSPITAL_BASED_OUTPATIENT_CLINIC_OR_DEPARTMENT_OTHER): Payer: Self-pay

## 2023-05-11 ENCOUNTER — Encounter: Payer: Self-pay | Admitting: Family Medicine

## 2023-05-11 ENCOUNTER — Ambulatory Visit (INDEPENDENT_AMBULATORY_CARE_PROVIDER_SITE_OTHER): Admitting: Family Medicine

## 2023-05-11 VITALS — BP 122/70 | HR 72 | Temp 97.6°F | Resp 16 | Ht 65.0 in | Wt 264.6 lb

## 2023-05-11 DIAGNOSIS — J0100 Acute maxillary sinusitis, unspecified: Secondary | ICD-10-CM | POA: Diagnosis not present

## 2023-05-11 DIAGNOSIS — H6692 Otitis media, unspecified, left ear: Secondary | ICD-10-CM | POA: Diagnosis not present

## 2023-05-11 MED ORDER — PREDNISONE 50 MG PO TABS
50.0000 mg | ORAL_TABLET | Freq: Every day | ORAL | 0 refills | Status: DC
Start: 2023-05-11 — End: 2023-06-23
  Filled 2023-05-11: qty 5, 5d supply, fill #0

## 2023-05-11 MED ORDER — AMOXICILLIN-POT CLAVULANATE 875-125 MG PO TABS
1.0000 | ORAL_TABLET | Freq: Two times a day (BID) | ORAL | 0 refills | Status: DC
  Filled 2023-05-11: qty 20, 10d supply, fill #0

## 2023-05-11 NOTE — Progress Notes (Signed)
 Acute Office Visit  Subjective:    Patient ID: Judy [name], female    DOB: May 04, 1960, 63 y.o.   MRN: [id number]  Chief Complaint  Patient presents with   Sinusitis    Runny nose sneezing    Discussed the use of AI scribe software for clinical note transcription with the patient, who gave verbal consent to proceed.   HPI: Patient is in today for facial pain, runny nose, congestion and itchy eyes. Patient states she has had symptoms for three or four days. Little cough. No shortness of breath. No sore throat.    Patient stated she has taken Mucinex and Flonase. She stated these two meds help a little but still has symptoms.  Past Medical History:  Diagnosis Date   Anxiety    Back pain    Bilateral swelling of feet    CAD (coronary artery disease), native coronary artery    coronary CTA 01/2022 < 25% plaque in the mid RCA, LAD and OM1 with coronary Ca score of 1.23.   Chronic combined systolic and diastolic CHF (congestive heart failure) (HCC) 10/07/2014   COPD (chronic obstructive pulmonary disease) (HCC)    DCM (dilated cardiomyopathy) (HCC) 04/25/2014   EF 28% by MRI despite maximum medical therapy   Depression    Diabetes mellitus without complication (HCC)    Epilepsy (HCC)    as a child   Fatty liver    Former tobacco use    Hyperlipidemia    Hypertension    Hypothyroidism    Joint pain    Leg swelling    Morbid obesity (HCC)    NICM (nonischemic cardiomyopathy) (HCC)    OSA (obstructive sleep apnea)    moderate with AHI 26/hr with oxygen desaturations as low as 70%   Scoliosis    Sleep apnea    SOB (shortness of breath)     Past Surgical History:  Procedure Laterality Date   ABDOMINAL HYSTERECTOMY     ankle artery involved cyst removal     CARDIAC CATHETERIZATION  05/14/2014   normal coronary arteries   CARPAL TUNNEL RELEASE     FASCIOTOMY     1993 for platar fasciitis   harrington rod scoliosis     1981   LEFT HEART CATHETERIZATION WITH  CORONARY ANGIOGRAM N/A 05/14/2014   Procedure: LEFT HEART CATHETERIZATION WITH CORONARY ANGIOGRAM;  Surgeon: Iran Ouch, MD;  Location: [facility]ATH [facility];  Service: Cardiovascular;  Laterality: N/A;   ROTATOR CUFF REPAIR Right 06/2021   UMBILICAL HERNIA REPAIR     VESICOVAGINAL FISTULA CLOSURE W/ TAH      Family History  Problem Relation Age of Onset   Diabetes Mother    Hyperlipidemia Mother    Obesity Mother    Emphysema Father    Allergies Father    Heart failure Father    Heart disease Father 29       MI   Diabetes Father    Hyperlipidemia Father    Hypertension Father    Thyroid disease Father    Alcohol abuse Father    Obesity Father    Breast cancer Neg Hx     Social History   Socioeconomic History   Marital status: Married    Spouse name: Not on file   Number of children: Not on file   Years of education: Not on file   Highest education level: Some college, no degree  Occupational History   Occupation: disabled  Tobacco Use   Smoking status:  Former    Current packs/day: 0.00    Average packs/day: 1 pack/day for 20.0 years (20.0 ttl pk-yrs)    Types: Cigarettes    Start date: 02/14/1985    Quit date: 02/14/2005    Years since quitting: 18.2   Smokeless tobacco: Never  Vaping Use   Vaping status: Never Used  Substance and Sexual Activity   Alcohol use: Yes    Alcohol/week: 2.0 standard drinks of alcohol    Types: 2 Shots of liquor per week    Comment: socially   Drug use: No   Sexual activity: Not Currently  Other Topics Concern   Not on file  Social History Narrative   Lives in [place] with spouse and son.   Previously worked as a Comptroller         Social Drivers of Corporate investment banker Strain: Low Risk  (08/26/2022)   Overall Financial Resource Strain (CARDIA)    Difficulty of Paying Living Expenses: Not very hard  Recent Concern: Financial Resource Strain - High Risk (06/23/2022)   Overall Financial Resource Strain  (CARDIA)    Difficulty of Paying Living Expenses: Very hard  Food Insecurity: No Food Insecurity (08/26/2022)   Hunger Vital Sign    Worried About Running Out of Food in the Last Year: Never true    Ran Out of Food in the Last Year: Never true  Transportation Needs: No Transportation Needs (08/26/2022)   PRAPARE - Administrator, Civil Service (Medical): No    Lack of Transportation (Non-Medical): No  Physical Activity: Insufficiently Active (08/26/2022)   Exercise Vital Sign    Days of Exercise per Week: 2 days    Minutes of Exercise per Session: 20 min  Stress: No Stress Concern Present (08/26/2022)   [employer] of Occupational Health - Occupational Stress Questionnaire    Feeling of Stress : Only a little  Social Connections: Socially Isolated (08/26/2022)   Social Connection and Isolation Panel [NHANES]    Frequency of Communication with Friends and Family: Once a week    Frequency of Social Gatherings with Friends and Family: Once a week    Attends Religious Services: Never    Database administrator or Organizations: No    Attends Engineer, structural: Not on file    Marital Status: Married  Catering manager Violence: Not At Risk (04/15/2022)   Humiliation, Afraid, Rape, and Kick questionnaire    Fear of Current or Ex-Partner: No    Emotionally Abused: No    Physically Abused: No    Sexually Abused: No    Outpatient Medications Prior to Visit  Medication Sig Dispense Refill   albuterol (VENTOLIN HFA) 108 (90 Base) MCG/ACT inhaler Inhale 2 puffs into the lungs every 6 (six) hours as needed for wheezing or shortness of breath. 8 g 2   aspirin 81 MG tablet Take 81 mg by mouth at bedtime.      atorvastatin (LIPITOR) 20 MG tablet TAKE 1 TABLET EVERY DAY 90 tablet 3   buPROPion (WELLBUTRIN XL) 300 MG 24 hr tablet TAKE 1 TABLET EVERY DAY 90 tablet 3   carvedilol (COREG) 6.25 MG tablet TAKE 1 TABLET TWICE DAILY 180 tablet 3   clonazePAM (KLONOPIN) 0.5 MG  tablet Take 1 tablet (0.5 mg total) by mouth 2 (two) times daily as needed for anxiety (panic attacks). 90 tablet 0   Coenzyme Q10 (CO Q 10) 100 MG CAPS Take 200 mg by mouth daily.  diphenhydrAMINE (BENADRYL) 25 MG tablet Take 50 mg by mouth at bedtime as needed.     Dulaglutide (TRULICITY) 3 MG/0.5ML SOPN Inject 3 mg as directed once a week. 2 mL 0   empagliflozin (JARDIANCE) 10 MG TABS tablet Take by mouth daily.     fluticasone (FLONASE) 50 MCG/ACT nasal spray Place 2 sprays into both nostrils daily. 48 mL 2   ipratropium-albuterol (DUONEB) 0.5-2.5 (3) MG/3ML SOLN INHALE THE CONTENTS OF 1 VIAL VIA NEBULIZER EVERY 4 (FOUR) HOURS AS NEEDED. 90 mL 1   levothyroxine (SYNTHROID) 75 MCG tablet TAKE 1 TABLET EVERY DAY 90 tablet 3   lidocaine (LIDODERM) 5 % Place 3 patches onto the skin daily. Remove & Discard patch within 12 hours or as directed by MD 90 patch 3   meloxicam (MOBIC) 15 MG tablet TAKE 1 TABLET EVERY DAY 90 tablet 3   Multiple Vitamin (MULTIVITAMIN WITH MINERALS) TABS tablet Take 1 tablet by mouth daily.     omeprazole (PRILOSEC) 20 MG capsule Take 1 capsule (20 mg total) by mouth daily. 90 capsule 3   OXYGEN 2lpm 2 lpm as needed     pregabalin (LYRICA) 300 MG capsule Take 1 capsule (300 mg total) by mouth 2 (two) times daily. 180 capsule 1   sacubitril-valsartan (ENTRESTO) 24-26 MG Take 1 tablet by mouth 2 (two) times daily. 180 tablet 3   Tiotropium Bromide-Olodaterol (STIOLTO RESPIMAT) 2.5-2.5 MCG/ACT AERS INHALE 2 PUFFS INTO THE LUNGS DAILY. 12 g 5   tiZANidine (ZANAFLEX) 4 MG tablet TAKE 1 TABLET EVERY 6 HOURS AS NEEDED FOR MUSCLE SPASM(S) 360 tablet 3   traMADol (ULTRAM) 50 MG tablet Take 1 tablet (50 mg total) by mouth every 6 (six) hours as needed for severe pain. Take one four times a day as needed for pain 360 tablet 0   UNABLE TO FIND CPAP with o2 2lpm  DME- [facility]     VASCEPA 1 g capsule TAKE 2 CAPSULES BY MOUTH TWICE A DAY 360 capsule 1   Vitamin D, Ergocalciferol,  (DRISDOL) 1.25 MG (50000 UNIT) CAPS capsule TAKE 1 CAPSULE EVERY 7 DAYS 12 capsule 3   FARXIGA 10 MG TABS tablet Take 1 tablet (10 mg total) by mouth daily. (Patient not taking: Reported on 05/11/2023) 90 tablet 0   oxyCODONE-acetaminophen (PERCOCET/ROXICET) 5-325 MG tablet Take 1 tablet by mouth every 6 (six) hours as needed for severe pain. (Patient not taking: Reported on 05/11/2023) 20 tablet 0   amoxicillin-clavulanate (AUGMENTIN) 875-125 MG tablet Take 1 tablet by mouth 2 (two) times daily. 20 tablet 0   No facility-administered medications prior to visit.    Allergies  Allergen Reactions   Meperidine Nausea And Vomiting and Other (See Comments)    Review of Systems  Constitutional:  Positive for chills and fatigue. Negative for fever.  HENT:  Positive for congestion, postnasal drip, rhinorrhea, sinus pressure, sinus pain and sneezing.   Eyes:  Positive for itching.  Respiratory:  Positive for cough and shortness of breath. Negative for choking and wheezing.   Cardiovascular:  Negative for chest pain and palpitations.  Gastrointestinal:  Negative for constipation, diarrhea, nausea and vomiting.  Endocrine: Negative.   Genitourinary:  Negative for difficulty urinating, dysuria, frequency, hematuria and urgency.  Musculoskeletal:  Positive for neck pain.  Skin: Negative.   Allergic/Immunologic: Negative.   Neurological:  Positive for dizziness. Negative for light-headedness and headaches.  Hematological: Negative.   Psychiatric/Behavioral:  Negative for confusion and suicidal ideas. The patient is not nervous/anxious.  Objective:        05/11/2023   10:23 AM 04/17/2023    2:28 PM 03/23/2023   10:50 AM  Vitals with BMI  Height 5\' 5"  5\' 5"  5\' 5"   Weight 264 lbs 10 oz 264 lbs 264 lbs  BMI 44.03 43.93 43.93  Systolic 122 137 409  Diastolic 70 78 72  Pulse 72  71    No data found.   Physical Exam Vitals reviewed.  Constitutional:      Appearance: Normal appearance.   HENT:     Right Ear: Tympanic membrane, ear canal and external ear normal.     Ears:     Comments: erythema    Nose: Congestion present.     Comments: Tenderness on sinus    Mouth/Throat:     Pharynx: Posterior oropharyngeal erythema present.  Neck:     Vascular: No carotid bruit.  Cardiovascular:     Rate and Rhythm: Normal rate and regular rhythm.     Pulses: Normal pulses.     Heart sounds: Normal heart sounds.  Pulmonary:     Effort: Pulmonary effort is normal.     Breath sounds: Normal breath sounds.  Abdominal:     General: Bowel sounds are normal.     Palpations: Abdomen is soft.     Tenderness: There is no abdominal tenderness.  Neurological:     Mental Status: She is alert and oriented to person, place, and time.  Psychiatric:        Mood and Affect: Mood normal.        Behavior: Behavior normal.     Health Maintenance Due  Topic Date Due   Hepatitis C Screening  Never done   Zoster Vaccines- Shingrix (1 of 2) Never done   COVID-19 Vaccine (7 - 2024-25 season) 12/12/2022    There are no preventive care reminders to display for this patient.   Lab Results  Component Value Date   TSH 1.550 08/26/2022   Lab Results  Component Value Date   WBC 6.0 03/21/2023   HGB 14.0 03/21/2023   HCT 42.0 03/21/2023   MCV 93 03/21/2023   PLT 203 03/21/2023   Lab Results  Component Value Date   NA 146 (H) 03/21/2023   K 4.5 03/21/2023   CO2 26 03/21/2023   GLUCOSE 103 (H) 03/21/2023   BUN 18 03/21/2023   CREATININE 0.69 03/21/2023   BILITOT 0.8 03/21/2023   ALKPHOS 182 (H) 03/21/2023   AST 20 03/21/2023   ALT 22 03/21/2023   PROT 5.4 (L) 03/21/2023   ALBUMIN 3.8 (L) 03/21/2023   CALCIUM 9.0 03/21/2023   ANIONGAP 9 10/30/2022   EGFR 98 03/21/2023   GFR 100.87 10/01/2014   Lab Results  Component Value Date   CHOL 149 03/21/2023   Lab Results  Component Value Date   HDL 40 03/21/2023   Lab Results  Component Value Date   LDLCALC 76 03/21/2023    Lab Results  Component Value Date   TRIG 199 (H) 03/21/2023   Lab Results  Component Value Date   CHOLHDL 3.7 03/21/2023   Lab Results  Component Value Date   HGBA1C 5.7 (H) 03/21/2023       Assessment & Plan:  Acute non-recurrent maxillary sinusitis Assessment & Plan: Order rapid Covid 19 test Sent Augmentin 875 mg twice a day and prednisone 50 mg daily for 5 day.  Orders: -     Amoxicillin-Pot Clavulanate; Take 1 tablet by mouth 2 (two) times  daily.  Dispense: 20 tablet; Refill: 0 -     predniSONE; Take 1 tablet (50 mg total) by mouth daily with breakfast.  Dispense: 5 tablet; Refill: 0 -     POC COVID-19 BinaxNow  Acute infection of left ear Assessment & Plan: Sent Augmentin 875 mg twice a day.  Orders: -     Amoxicillin-Pot Clavulanate; Take 1 tablet by mouth 2 (two) times daily.  Dispense: 20 tablet; Refill: 0     Assessment and Plan       Meds ordered this encounter  Medications   amoxicillin-clavulanate (AUGMENTIN) 875-125 MG tablet    Sig: Take 1 tablet by mouth 2 (two) times daily.    Dispense:  20 tablet    Refill:  0   predniSONE (DELTASONE) 50 MG tablet    Sig: Take 1 tablet (50 mg total) by mouth daily with breakfast.    Dispense:  5 tablet    Refill:  0    Orders Placed This Encounter  Procedures   POC COVID-19 BinaxNow     Follow-up: No follow-ups on file.  An After Visit Summary was printed and given to the patient.  Clayborn Bigness I Leal-Borjas,acting as a scribe for Blane Ohara, MD.,have documented all relevant documentation on the behalf of Blane Ohara, MD,as directed by  Blane Ohara, MD while in the presence of Blane Ohara, MD.    Blane Ohara, MD [facility] [phone]

## 2023-05-12 DIAGNOSIS — H6692 Otitis media, unspecified, left ear: Secondary | ICD-10-CM | POA: Insufficient documentation

## 2023-05-12 NOTE — Assessment & Plan Note (Addendum)
 Order rapid Covid 19 test Sent Augmentin 875 mg twice a day and prednisone 50 mg daily for 5 day.

## 2023-05-12 NOTE — Assessment & Plan Note (Signed)
 Sent Augmentin 875 mg twice a day.

## 2023-05-13 ENCOUNTER — Encounter: Payer: Self-pay | Admitting: Family Medicine

## 2023-05-13 LAB — POC COVID19 BINAXNOW: SARS Coronavirus 2 Ag: NEGATIVE

## 2023-05-15 ENCOUNTER — Encounter: Payer: Self-pay | Admitting: Cardiology

## 2023-06-06 ENCOUNTER — Other Ambulatory Visit: Payer: Self-pay

## 2023-06-06 MED ORDER — PREGABALIN 300 MG PO CAPS: 300.0000 mg | ORAL_CAPSULE | Freq: Two times a day (BID) | ORAL | 1 refills | Status: DC

## 2023-06-08 DIAGNOSIS — E119 Type 2 diabetes mellitus without complications: Secondary | ICD-10-CM | POA: Diagnosis not present

## 2023-06-08 LAB — HM DIABETES EYE EXAM

## 2023-06-09 ENCOUNTER — Other Ambulatory Visit: Payer: Self-pay | Admitting: Cardiology

## 2023-06-09 ENCOUNTER — Ambulatory Visit (HOSPITAL_COMMUNITY)
Admission: RE | Admit: 2023-06-09 | Discharge: 2023-06-09 | Disposition: A | Discharge status: Home / Self Care | Source: Ambulatory Visit | Attending: Cardiology | Admitting: Cardiology

## 2023-06-09 ENCOUNTER — Encounter (HOSPITAL_COMMUNITY)
Admission: RE | Admit: 2023-06-09 | Discharge: 2023-06-09 | Disposition: A | Discharge status: Home / Self Care | Source: Ambulatory Visit | Attending: Cardiology | Admitting: Cardiology

## 2023-06-09 DIAGNOSIS — J9611 Chronic respiratory failure with hypoxia: Secondary | ICD-10-CM

## 2023-06-09 DIAGNOSIS — I420 Dilated cardiomyopathy: Secondary | ICD-10-CM

## 2023-06-09 DIAGNOSIS — Z981 Arthrodesis status: Secondary | ICD-10-CM | POA: Diagnosis not present

## 2023-06-09 DIAGNOSIS — G4733 Obstructive sleep apnea (adult) (pediatric): Secondary | ICD-10-CM

## 2023-06-09 DIAGNOSIS — R0602 Shortness of breath: Secondary | ICD-10-CM | POA: Diagnosis not present

## 2023-06-09 MED ORDER — TECHNETIUM TO 99M ALBUMIN AGGREGATED
4.4000 | Freq: Once | INTRAVENOUS | Status: AC | PRN
Start: 2023-06-09 — End: 2023-06-09
  Administered 2023-06-09: 4.4 via INTRAVENOUS

## 2023-06-13 ENCOUNTER — Encounter: Payer: Self-pay | Admitting: Family Medicine

## 2023-06-19 ENCOUNTER — Other Ambulatory Visit: Payer: Self-pay

## 2023-06-19 DIAGNOSIS — E038 Other specified hypothyroidism: Secondary | ICD-10-CM

## 2023-06-19 DIAGNOSIS — E782 Mixed hyperlipidemia: Secondary | ICD-10-CM

## 2023-06-19 DIAGNOSIS — E1169 Type 2 diabetes mellitus with other specified complication: Secondary | ICD-10-CM

## 2023-06-19 DIAGNOSIS — I110 Hypertensive heart disease with heart failure: Secondary | ICD-10-CM

## 2023-06-20 ENCOUNTER — Other Ambulatory Visit

## 2023-06-20 ENCOUNTER — Ambulatory Visit: Payer: Medicare HMO

## 2023-06-20 DIAGNOSIS — E785 Hyperlipidemia, unspecified: Secondary | ICD-10-CM | POA: Diagnosis not present

## 2023-06-20 DIAGNOSIS — E1169 Type 2 diabetes mellitus with other specified complication: Secondary | ICD-10-CM | POA: Diagnosis not present

## 2023-06-20 DIAGNOSIS — E782 Mixed hyperlipidemia: Secondary | ICD-10-CM

## 2023-06-20 DIAGNOSIS — I5022 Chronic systolic (congestive) heart failure: Secondary | ICD-10-CM | POA: Diagnosis not present

## 2023-06-20 DIAGNOSIS — I110 Hypertensive heart disease with heart failure: Secondary | ICD-10-CM

## 2023-06-21 ENCOUNTER — Encounter: Payer: Self-pay | Admitting: Family Medicine

## 2023-06-21 LAB — CBC WITH DIFFERENTIAL/PLATELET
Basophils Absolute: 0.1 10*3/uL (ref 0.0–0.2)
Basos: 1 %
EOS (ABSOLUTE): 0.3 10*3/uL (ref 0.0–0.4)
Eos: 4 %
Hematocrit: 43 % (ref 34.0–46.6)
Hemoglobin: 13.9 g/dL (ref 11.1–15.9)
Immature Grans (Abs): 0 10*3/uL (ref 0.0–0.1)
Immature Granulocytes: 0 %
Lymphocytes Absolute: 1.4 10*3/uL (ref 0.7–3.1)
Lymphs: 20 %
MCH: 29.8 pg (ref 26.6–33.0)
MCHC: 32.3 g/dL (ref 31.5–35.7)
MCV: 92 fL (ref 79–97)
Monocytes Absolute: 0.5 10*3/uL (ref 0.1–0.9)
Monocytes: 7 %
Neutrophils Absolute: 4.8 10*3/uL (ref 1.4–7.0)
Neutrophils: 68 %
Platelets: 215 10*3/uL (ref 150–450)
RBC: 4.66 x10E6/uL (ref 3.77–5.28)
RDW: 13.3 % (ref 11.7–15.4)
WBC: 7 10*3/uL (ref 3.4–10.8)

## 2023-06-21 LAB — COMPREHENSIVE METABOLIC PANEL WITH GFR
ALT: 20 IU/L (ref 0–32)
AST: 19 IU/L (ref 0–40)
Albumin: 3.9 g/dL (ref 3.9–4.9)
Alkaline Phosphatase: 201 IU/L — ABNORMAL HIGH (ref 44–121)
BUN/Creatinine Ratio: 39 — ABNORMAL HIGH (ref 12–28)
BUN: 26 mg/dL (ref 8–27)
Bilirubin Total: 0.9 mg/dL (ref 0.0–1.2)
CO2: 22 mmol/L (ref 20–29)
Calcium: 9.1 mg/dL (ref 8.7–10.3)
Chloride: 111 mmol/L — ABNORMAL HIGH (ref 96–106)
Creatinine, Ser: 0.67 mg/dL (ref 0.57–1.00)
Globulin, Total: 1.9 g/dL (ref 1.5–4.5)
Glucose: 123 mg/dL — ABNORMAL HIGH (ref 70–99)
Potassium: 4.6 mmol/L (ref 3.5–5.2)
Sodium: 147 mmol/L — ABNORMAL HIGH (ref 134–144)
Total Protein: 5.8 g/dL — ABNORMAL LOW (ref 6.0–8.5)
eGFR: 99 mL/min/{1.73_m2} (ref >59)

## 2023-06-21 LAB — LIPID PANEL
Chol/HDL Ratio: 3.4 ratio (ref 0.0–4.4)
Cholesterol, Total: 124 mg/dL (ref 100–199)
HDL: 36 mg/dL — ABNORMAL LOW (ref >39)
LDL Chol Calc (NIH): 57 mg/dL (ref 0–99)
Triglycerides: 183 mg/dL — ABNORMAL HIGH (ref 0–149)
VLDL Cholesterol Cal: 31 mg/dL (ref 5–40)

## 2023-06-21 LAB — HEMOGLOBIN A1C
Est. average glucose Bld gHb Est-mCnc: 117 mg/dL
Hgb A1c MFr Bld: 5.7 % — ABNORMAL HIGH (ref 4.8–5.6)

## 2023-06-22 NOTE — Progress Notes (Unsigned)
 Subjective:  Patient ID: Judy [name], female    DOB: [birth year]  Age: 63 y.o. MRN: [id number]  Chief Complaint  Patient presents with   Medical Management of Chronic Issues    HPI: Diabetes:  Complications: hyperlipidemia Glucose checking: not checking Most recent A1C: 5.7 Current medications: Farxiga  10 mg once daily, Trulicity  3 mg weekly. Last Eye Exam: 06/08/23 Foot checks: yes Lifestyle changes: Eating fairly healthy. Unable to exercise due to back pain.    Hyperlipidemia: Current medications: lipitor 20 mg daily, vascepa  2 oral twice daily, coenzyme Q 10 daily Labs reviewed.    Hypertension with chf due to idiopathic cardiomyopathy. Current medications: Coreg  6.25 mg twice daily, Entresto  24-26 mg twice daily, ASA 81 mg daily   Chronic pain syndrome: lumbar back pain secondary to severe scoliosis and harrington rod complication. Takes percocet and meloxican. Tramadol  as needed for severe pain   OSA/COPD: uses cpap. Compliant and benefits. On stiolto. Sees pulmonology. Continues to have shortness of breath with exertion.    Chronic Respiratory Failure with hypoxia. Wears oxygen  2 L at night and with exertion as needed daily.    Hypothyroidism -currently on synthroid  75 mcg. TSH 1.55   Depression, mild, recurrent- wellbutrin  XL 300 mg once daily in a.m. clonazepam  0.5 mg 1 twice daily as needed for panic attacks.      03/23/2023   10:51 AM 12/08/2022    3:22 PM 08/30/2022    3:41 PM 04/15/2022   10:10 AM 03/24/2022    2:05 PM  Depression screen PHQ 2/9  Decreased Interest 0 0 1 0 0  Down, Depressed, Hopeless 0 0 1 0 0  PHQ - 2 Score 0 0 2 0 0  Altered sleeping 1 2 2 3 2   Tired, decreased energy 1 1 2  0 1  Change in appetite 1 0 1 0 0  Feeling bad or failure about yourself  1 0 0 0 0  Trouble concentrating 1 1  0 2  Moving slowly or fidgety/restless 1 0 0 0 0  Suicidal thoughts 0  0 0 0  PHQ-9 Score 6 4 7 3 5   Difficult doing work/chores Not difficult at all  Somewhat difficult  Not difficult at all Not difficult at all        03/23/2023   10:51 AM  Fall Risk   Falls in the past year? 0  Number falls in past yr: 0  Injury with Fall? 0  Risk for fall due to : No Fall Risks  Follow up Falls evaluation completed    Patient Care Team: Mercy Stall, MD as PCP - General (Family Medicine) Jacqueline Matsu, MD as PCP - Cardiology (Cardiology) Devon Fogo, [facility] (Inactive) (Pharmacist) Alfornia Anis., OD (Optometry)   Review of Systems  Constitutional:  Negative for chills, fatigue and fever.  HENT:  Negative for congestion, ear pain and sore throat.   Respiratory:  Positive for shortness of breath. Negative for cough.   Cardiovascular:  Negative for chest pain and palpitations.  Gastrointestinal:  Negative for abdominal pain, constipation, diarrhea, nausea and vomiting.  Endocrine: Negative for polydipsia, polyphagia and polyuria.  Genitourinary:  Negative for difficulty urinating and dysuria.  Musculoskeletal:  Positive for arthralgias (hip and toes. Left shoulder) and back pain. Negative for myalgias.  Skin:  Negative for rash.  Neurological:  Negative for headaches.  Psychiatric/Behavioral:  Negative for dysphoric mood. The patient is not nervous/anxious.     Current Outpatient Medications on File Prior to  Visit  Medication Sig Dispense Refill   albuterol  (VENTOLIN  HFA) 108 (90 Base) MCG/ACT inhaler Inhale 2 puffs into the lungs every 6 (six) hours as needed for wheezing or shortness of breath. 8 g 2   aspirin  81 MG tablet Take 81 mg by mouth at bedtime.      atorvastatin  (LIPITOR) 20 MG tablet TAKE 1 TABLET EVERY DAY 90 tablet 3   buPROPion  (WELLBUTRIN  XL) 300 MG 24 hr tablet TAKE 1 TABLET EVERY DAY 90 tablet 3   carvedilol  (COREG ) 6.25 MG tablet TAKE 1 TABLET TWICE DAILY 180 tablet 2   clonazePAM  (KLONOPIN ) 0.5 MG tablet Take 1 tablet (0.5 mg total) by mouth 2 (two) times daily as needed for anxiety (panic attacks). 90 tablet 0    Coenzyme Q10 (CO Q 10) 100 MG CAPS Take 200 mg by mouth daily.     diphenhydrAMINE (BENADRYL) 25 MG tablet Take 50 mg by mouth at bedtime as needed.     Dulaglutide  (TRULICITY ) 3 MG/0.5ML SOPN Inject 3 mg as directed once a week. 2 mL 0   empagliflozin (JARDIANCE) 10 MG TABS tablet Take by mouth daily.     fluticasone  (FLONASE ) 50 MCG/ACT nasal spray Place 2 sprays into both nostrils daily. 48 mL 2   ipratropium-albuterol  (DUONEB) 0.5-2.5 (3) MG/3ML SOLN INHALE THE CONTENTS OF 1 VIAL VIA NEBULIZER EVERY 4 (FOUR) HOURS AS NEEDED. 90 mL 1   levothyroxine  (SYNTHROID ) 75 MCG tablet TAKE 1 TABLET EVERY DAY 90 tablet 3   lidocaine  (LIDODERM ) 5 % Place 3 patches onto the skin daily. Remove & Discard patch within 12 hours or as directed by MD 90 patch 3   meloxicam  (MOBIC ) 15 MG tablet TAKE 1 TABLET EVERY DAY 90 tablet 3   Multiple Vitamin (MULTIVITAMIN WITH MINERALS) TABS tablet Take 1 tablet by mouth daily.     omeprazole  (PRILOSEC) 20 MG capsule Take 1 capsule (20 mg total) by mouth daily. 90 capsule 3   OXYGEN  2lpm 2 lpm as needed     pregabalin  (LYRICA ) 300 MG capsule Take 1 capsule (300 mg total) by mouth 2 (two) times daily. 180 capsule 1   sacubitril -valsartan  (ENTRESTO ) 24-26 MG Take 1 tablet by mouth 2 (two) times daily. 180 tablet 3   Tiotropium Bromide -Olodaterol (STIOLTO RESPIMAT ) 2.5-2.5 MCG/ACT AERS INHALE 2 PUFFS INTO THE LUNGS DAILY. 12 g 5   tiZANidine  (ZANAFLEX ) 4 MG tablet TAKE 1 TABLET EVERY 6 HOURS AS NEEDED FOR MUSCLE SPASM(S) 360 tablet 3   traMADol  (ULTRAM ) 50 MG tablet Take 1 tablet (50 mg total) by mouth every 6 (six) hours as needed for severe pain. Take one four times a day as needed for pain 360 tablet 0   UNABLE TO FIND CPAP with o2 2lpm  DME- [facility]     VASCEPA  1 g capsule TAKE 2 CAPSULES BY MOUTH TWICE A DAY 360 capsule 1   Vitamin D , Ergocalciferol , (DRISDOL ) 1.25 MG (50000 UNIT) CAPS capsule TAKE 1 CAPSULE EVERY 7 DAYS 12 capsule 3   No current facility-administered  medications on file prior to visit.   Past Medical History:  Diagnosis Date   Anxiety    Back pain    Bilateral swelling of feet    CAD (coronary artery disease), native coronary artery    coronary CTA 01/2022 < 25% plaque in the mid RCA, LAD and OM1 with coronary Ca score of 1.23.   Chronic combined systolic and diastolic CHF (congestive heart failure) (HCC) 10/07/2014   COPD (chronic obstructive pulmonary  disease) (HCC)    DCM (dilated cardiomyopathy) (HCC) 04/25/2014   EF 28% by MRI despite maximum medical therapy   Depression    Diabetes mellitus without complication (HCC)    Epilepsy (HCC)    as a child   Fatty liver    Former tobacco use    Hyperlipidemia    Hypertension    Hypothyroidism    Joint pain    Leg swelling    Morbid obesity (HCC)    NICM (nonischemic cardiomyopathy) (HCC)    OSA (obstructive sleep apnea)    moderate with AHI 26/hr with oxygen  desaturations as low as 70%   Scoliosis    Sleep apnea    SOB (shortness of breath)    Past Surgical History:  Procedure Laterality Date   ABDOMINAL HYSTERECTOMY     ankle artery involved cyst removal     CARDIAC CATHETERIZATION  05/14/2014   normal coronary arteries   CARPAL TUNNEL RELEASE     FASCIOTOMY     1993 for platar fasciitis   harrington rod scoliosis     1981   LEFT HEART CATHETERIZATION WITH CORONARY ANGIOGRAM N/A 05/14/2014   Procedure: LEFT HEART CATHETERIZATION WITH CORONARY ANGIOGRAM;  Surgeon: Wenona Hamilton, MD;  Location: [facility] CATH LAB;  Service: Cardiovascular;  Laterality: N/A;   ROTATOR CUFF REPAIR Right 06/2021   UMBILICAL HERNIA REPAIR     VESICOVAGINAL FISTULA CLOSURE W/ TAH      Family History  Problem Relation Age of Onset   Diabetes Mother    Hyperlipidemia Mother    Obesity Mother    Emphysema Father    Allergies Father    Heart failure Father    Heart disease Father 73       MI   Diabetes Father    Hyperlipidemia Father    Hypertension Father    Thyroid  disease Father     Alcohol abuse Father    Obesity Father    Breast cancer Neg Hx    Social History   Socioeconomic History   Marital status: Married    Spouse name: Not on file   Number of children: Not on file   Years of education: Not on file   Highest education level: Some college, no degree  Occupational History   Occupation: disabled  Tobacco Use   Smoking status: Former    Current packs/day: 0.00    Average packs/day: 1 pack/day for 20.0 years (20.0 ttl pk-yrs)    Types: Cigarettes    Start date: 02/14/1985    Quit date: 02/14/2005    Years since quitting: 18.3   Smokeless tobacco: Never  Vaping Use   Vaping status: Never Used  Substance and Sexual Activity   Alcohol use: Yes    Alcohol/week: 2.0 standard drinks of alcohol    Types: 2 Shots of liquor per week    Comment: socially   Drug use: No   Sexual activity: Not Currently  Other Topics Concern   Not on file  Social History Narrative   Lives in [place] with spouse and son.   Previously worked as a Comptroller         Social Drivers of Corporate investment banker Strain: Low Risk  (08/26/2022)   Overall Financial Resource Strain (CARDIA)    Difficulty of Paying Living Expenses: Not very hard  Recent Concern: Financial Resource Strain - High Risk (06/23/2022)   Overall Financial Resource Strain (CARDIA)    Difficulty of Paying Living Expenses: Very  hard  Food Insecurity: No Food Insecurity (08/26/2022)   Hunger Vital Sign    Worried About Running Out of Food in the Last Year: Never true    Ran Out of Food in the Last Year: Never true  Transportation Needs: No Transportation Needs (08/26/2022)   PRAPARE - Administrator, Civil Service (Medical): No    Lack of Transportation (Non-Medical): No  Physical Activity: Insufficiently Active (08/26/2022)   Exercise Vital Sign    Days of Exercise per Week: 2 days    Minutes of Exercise per Session: 20 min  Stress: No Stress Concern Present (08/26/2022)    [employer] of Occupational Health - Occupational Stress Questionnaire    Feeling of Stress : Only a little  Social Connections: Socially Isolated (08/26/2022)   Social Connection and Isolation Panel [NHANES]    Frequency of Communication with Friends and Family: Once a week    Frequency of Social Gatherings with Friends and Family: Once a week    Attends Religious Services: Never    Diplomatic Services operational officer: No    Attends Engineer, structural: Not on file    Marital Status: Married    Objective:  BP 108/70   Pulse 83   Temp (!) 97.3 F (36.3 C)   Resp 16   Ht 5\' 5"  (1.651 m)   Wt 259 lb (117.5 kg)   SpO2 93%   BMI 43.10 kg/m      06/23/2023   10:17 AM 05/11/2023   10:23 AM 04/17/2023    2:28 PM  BP/Weight  Systolic BP 108 122 137  Diastolic BP 70 70 78  Wt. (Lbs) 259 264.6 264  BMI 43.1 kg/m2 44.03 kg/m2 43.93 kg/m2    Physical Exam Vitals reviewed.  Constitutional:      Appearance: Normal appearance. She is obese.  Neck:     Vascular: No carotid bruit.  Cardiovascular:     Rate and Rhythm: Normal rate and regular rhythm.     Heart sounds: Normal heart sounds.  Pulmonary:     Effort: Pulmonary effort is normal. No respiratory distress.     Breath sounds: Normal breath sounds.  Abdominal:     General: Abdomen is flat. Bowel sounds are normal.     Palpations: Abdomen is soft.     Tenderness: There is no abdominal tenderness.  Neurological:     Mental Status: She is alert and oriented to person, place, and time.  Psychiatric:        Mood and Affect: Mood normal.        Behavior: Behavior normal.     Diabetic Foot Exam - Simple   Simple Foot Form  06/23/2023 11:38 PM  Visual Inspection No deformities, no ulcerations, no other skin breakdown bilaterally: Yes Sensation Testing Intact to touch and monofilament testing bilaterally: Yes Pulse Check Posterior Tibialis and Dorsalis pulse intact bilaterally: Yes Comments      Lab  Results  Component Value Date   WBC 7.0 06/20/2023   HGB 13.9 06/20/2023   HCT 43.0 06/20/2023   PLT 215 06/20/2023   GLUCOSE 123 (H) 06/20/2023   CHOL 124 06/20/2023   TRIG 183 (H) 06/20/2023   HDL 36 (L) 06/20/2023   LDLCALC 57 06/20/2023   ALT 20 06/20/2023   AST 19 06/20/2023   NA 147 (H) 06/20/2023   K 4.6 06/20/2023   CL 111 (H) 06/20/2023   CREATININE 0.67 06/20/2023   BUN 26 06/20/2023  CO2 22 06/20/2023   TSH 1.550 08/26/2022   INR 1.0 05/13/2014   HGBA1C 5.7 (H) 06/20/2023   MICROALBUR Negative 09/03/2019      Assessment & Plan:  Hypertensive heart disease with chronic systolic congestive heart failure (HCC) Assessment & Plan: Well controlled.  No changes to medicines. Continue farxiga  10 mg daily, Coreg  6.25 mg twice daily, Entresto  24-26 mg twice daily, ASA 81 mg daily Continue to work on eating a healthy diet and exercise.  Labs reviewed.    Chronic respiratory failure with hypoxia (HCC) Assessment & Plan: Continue oxygen  2 L at night and with CPAP.  Oxygen  as needed during the day.    Gastroesophageal reflux disease without esophagitis Assessment & Plan: The current medical regimen is effective;  continue present plan and medications.  Continue omeprazole .   Other specified hypothyroidism Assessment & Plan: Previously well controlled Continue Synthroid  at current dose     Hyperlipidemia associated with type 2 diabetes mellitus (HCC) Assessment & Plan: Diabetes and cholesterol at goal Recommend check feet daily. Recommend annual eye exams. Medicines: Continue Farxiga  10 mg once daily, Trulicity  3 mg once daily, atorvastatin  20 mg before bed, vascepa  1 gm 2 capsules twice daily, and coenzyme q10.  Continue to work on eating a healthy diet and exercise.  Labs reviewed   COPD GOLD  III  Assessment & Plan: Stable Continue stiolto. Continue albuterol  or duoneb as needed    Morbid obesity due to excess calories [facility]) Assessment &  Plan: Recommend continue to work on eating healthy diet and exercise.       No orders of the defined types were placed in this encounter.   No orders of the defined types were placed in this encounter.    Follow-up: Return in about 3 months (around 09/23/2023) for chronic follow up.   I,Marla I Leal-Borjas,acting as a scribe for Mercy Stall, MD.,have documented all relevant documentation on the behalf of Mercy Stall, MD,as directed by  Mercy Stall, MD while in the presence of Mercy Stall, MD.   An After Visit Summary was printed and given to the patient.  I attest that I have reviewed this visit and agree with the plan scribed by my staff.   Mercy Stall, MD [facility] [phone]

## 2023-06-23 ENCOUNTER — Ambulatory Visit (INDEPENDENT_AMBULATORY_CARE_PROVIDER_SITE_OTHER): Payer: Medicare HMO | Admitting: Family Medicine

## 2023-06-23 ENCOUNTER — Encounter: Payer: Self-pay | Admitting: Family Medicine

## 2023-06-23 VITALS — BP 108/70 | HR 83 | Temp 97.3°F | Resp 16 | Ht 65.0 in | Wt 259.0 lb

## 2023-06-23 DIAGNOSIS — E1169 Type 2 diabetes mellitus with other specified complication: Secondary | ICD-10-CM

## 2023-06-23 DIAGNOSIS — J449 Chronic obstructive pulmonary disease, unspecified: Secondary | ICD-10-CM | POA: Diagnosis not present

## 2023-06-23 DIAGNOSIS — I110 Hypertensive heart disease with heart failure: Secondary | ICD-10-CM

## 2023-06-23 DIAGNOSIS — E785 Hyperlipidemia, unspecified: Secondary | ICD-10-CM | POA: Diagnosis not present

## 2023-06-23 DIAGNOSIS — E038 Other specified hypothyroidism: Secondary | ICD-10-CM

## 2023-06-23 DIAGNOSIS — J9611 Chronic respiratory failure with hypoxia: Secondary | ICD-10-CM | POA: Diagnosis not present

## 2023-06-23 DIAGNOSIS — K219 Gastro-esophageal reflux disease without esophagitis: Secondary | ICD-10-CM

## 2023-06-23 DIAGNOSIS — I5022 Chronic systolic (congestive) heart failure: Secondary | ICD-10-CM | POA: Diagnosis not present

## 2023-06-24 NOTE — Assessment & Plan Note (Signed)
Diabetes and cholesterol at goal Recommend check feet daily. Recommend annual eye exams. Medicines: Continue Farxiga 10 mg once daily, Trulicity 3 mg once daily, atorvastatin 20 mg before bed, vascepa 1 gm 2 capsules twice daily, and coenzyme q10.  Continue to work on eating a healthy diet and exercise.  Labs reviewed

## 2023-06-24 NOTE — Assessment & Plan Note (Signed)
Continue oxygen 2 L at night and with CPAP.  Oxygen as needed during the day.

## 2023-06-24 NOTE — Assessment & Plan Note (Signed)
Well controlled.  No changes to medicines. Continue farxiga 10 mg daily, Coreg 6.25 mg twice daily, Entresto 24-26 mg twice daily, ASA 81 mg daily Continue to work on eating a healthy diet and exercise.  Labs reviewed.

## 2023-06-24 NOTE — Assessment & Plan Note (Signed)
 The current medical regimen is effective;  continue present plan and medications.

## 2023-06-26 NOTE — Assessment & Plan Note (Signed)
 Previously well controlled Continue Synthroid at current dose

## 2023-06-26 NOTE — Assessment & Plan Note (Signed)
 Stable Continue stiolto. Continue albuterol  or duoneb as needed

## 2023-06-26 NOTE — Assessment & Plan Note (Signed)
 Recommend continue to work on eating healthy diet and exercise.

## 2023-07-05 ENCOUNTER — Other Ambulatory Visit: Payer: Self-pay | Admitting: Family Medicine

## 2023-07-05 DIAGNOSIS — E559 Vitamin D deficiency, unspecified: Secondary | ICD-10-CM

## 2023-07-11 ENCOUNTER — Ambulatory Visit
Admission: RE | Admit: 2023-07-11 | Discharge: 2023-07-11 | Disposition: A | Discharge status: Home / Self Care | Source: Ambulatory Visit | Attending: Family Medicine | Admitting: Family Medicine

## 2023-07-11 DIAGNOSIS — Z1231 Encounter for screening mammogram for malignant neoplasm of breast: Secondary | ICD-10-CM | POA: Diagnosis not present

## 2023-07-13 ENCOUNTER — Ambulatory Visit: Payer: Self-pay | Admitting: Family Medicine

## 2023-07-17 ENCOUNTER — Ambulatory Visit: Admitting: Family Medicine

## 2023-07-17 ENCOUNTER — Encounter: Payer: Self-pay | Admitting: Family Medicine

## 2023-07-17 VITALS — BP 118/68 | HR 96 | Temp 97.8°F | Ht 65.0 in | Wt 260.0 lb

## 2023-07-17 DIAGNOSIS — M5412 Radiculopathy, cervical region: Secondary | ICD-10-CM | POA: Insufficient documentation

## 2023-07-17 DIAGNOSIS — G959 Disease of spinal cord, unspecified: Secondary | ICD-10-CM | POA: Diagnosis not present

## 2023-07-17 MED ORDER — AMOXICILLIN 875 MG PO TABS: 875.0000 mg | ORAL_TABLET | Freq: Two times a day (BID) | ORAL | 0 refills | Status: AC

## 2023-07-17 MED ORDER — PREDNISONE 50 MG PO TABS: 50.0000 mg | ORAL_TABLET | Freq: Every day | ORAL | 0 refills | Status: DC

## 2023-07-17 NOTE — Assessment & Plan Note (Signed)
 PREVIOUSLY ABNORMAL MRI OF NECK IN 2022.  PRESCRIPTION: PREDNISONE  50 MG DAILY X 5 DAYS.  CONSIDER PHYSICAL THERAPY, BUT PROCEED WITH REPEAT MRI OF CSPINE DUE TO SIGNIFICANT ABNORMALITIES IN THE PAST AND BL SYMPTOMS (LEFT>RIGHT.)

## 2023-07-17 NOTE — Progress Notes (Signed)
 Subjective:  Patient ID: Judy [name], female    DOB: [birth year]  Age: 63 y.o. MRN: [id number]  Chief Complaint  Patient presents with   Neck Pain    HPI: Neck pain-states when she turns her head to the left she gets tingling sensation down her shoulder.  Noticed this 2 weeks ago. No known injury. Patient is taking tizanidine  4 mg once at night, tramadol , tylenol . Patient has history of very abnormal mri of cspine in 2022. Symptoms are now worsening.   MRI NECK 11/2020 IMPRESSION: 1. Grade 1 anterolisthesis of C4 on C5 and C5 on C6 is progressed since 2016. 2. Multilevel degenerative changes throughout the cervical spine as detailed above are overall progressed since 2016. Findings result in up to severe left and moderate right neural foraminal stenosis at C4-C5. No high-grade spinal canal stenosis in the cervical spine. 3. Multilevel facet arthropathy, most advanced on the left at C3-C4 and C4-C5, progressed since 2016.       07/17/2023    9:18 AM 03/23/2023   10:51 AM 12/08/2022    3:22 PM 08/30/2022    3:41 PM 04/15/2022   10:10 AM  Depression screen PHQ 2/9  Decreased Interest 0 0 0 1 0  Down, Depressed, Hopeless 0 0 0 1 0  PHQ - 2 Score 0 0 0 2 0  Altered sleeping 0 1 2 2 3   Tired, decreased energy 1 1 1 2  0  Change in appetite 0 1 0 1 0  Feeling bad or failure about yourself  0 1 0 0 0  Trouble concentrating 0 1 1  0  Moving slowly or fidgety/restless 0 1 0 0 0  Suicidal thoughts 0 0  0 0  PHQ-9 Score 1 6 4 7 3   Difficult doing work/chores Not difficult at all Not difficult at all Somewhat difficult  Not difficult at all        03/23/2023   10:51 AM  Fall Risk   Falls in the past year? 0  Number falls in past yr: 0  Injury with Fall? 0  Risk for fall due to : No Fall Risks  Follow up Falls evaluation completed    Patient Care Team: Mercy Stall, MD as PCP - General (Family Medicine) Devon Fogo, [facility] (Inactive) (Pharmacist) Alfornia Anis., OD  (Optometry) Jacqueline Matsu, MD as Consulting Physician (Cardiology) Marcene Serve, MD as Referring Physician (Orthopedic Surgery) Diamond Formica, MD as Consulting Physician (Pulmonary Disease) Pecen, Paula E, MD as Consulting Physician (Ophthalmology)   Review of Systems  Constitutional:  Negative for chills, fatigue and fever.  Respiratory:  Negative for shortness of breath.   Cardiovascular:  Negative for chest pain.  Musculoskeletal:  Positive for neck pain. Negative for myalgias.  Neurological:        Paresthesias intermittently of BL hands. Paresthesias over left shoulder and left side of neck.     Current Outpatient Medications on File Prior to Visit  Medication Sig Dispense Refill   albuterol  (VENTOLIN  HFA) 108 (90 Base) MCG/ACT inhaler Inhale 2 puffs into the lungs every 6 (six) hours as needed for wheezing or shortness of breath. 8 g 2   aspirin  81 MG tablet Take 81 mg by mouth at bedtime.      atorvastatin  (LIPITOR) 20 MG tablet TAKE 1 TABLET EVERY DAY 90 tablet 3   buPROPion  (WELLBUTRIN  XL) 300 MG 24 hr tablet TAKE 1 TABLET EVERY DAY 90 tablet 3   carvedilol  (COREG ) 6.25 MG  tablet TAKE 1 TABLET TWICE DAILY 180 tablet 2   clonazePAM  (KLONOPIN ) 0.5 MG tablet Take 1 tablet (0.5 mg total) by mouth 2 (two) times daily as needed for anxiety (panic attacks). 90 tablet 0   Coenzyme Q10 (CO Q 10) 100 MG CAPS Take 200 mg by mouth daily.     diphenhydrAMINE (BENADRYL) 25 MG tablet Take 50 mg by mouth at bedtime as needed.     Dulaglutide  (TRULICITY ) 3 MG/0.5ML SOPN Inject 3 mg as directed once a week. 2 mL 0   empagliflozin (JARDIANCE) 10 MG TABS tablet Take by mouth daily.     fluticasone  (FLONASE ) 50 MCG/ACT nasal spray Place 2 sprays into both nostrils daily. 48 mL 2   ipratropium-albuterol  (DUONEB) 0.5-2.5 (3) MG/3ML SOLN INHALE THE CONTENTS OF 1 VIAL VIA NEBULIZER EVERY 4 (FOUR) HOURS AS NEEDED. 90 mL 1   levothyroxine  (SYNTHROID ) 75 MCG tablet TAKE 1 TABLET EVERY DAY 90 tablet 3    lidocaine  (LIDODERM ) 5 % Place 3 patches onto the skin daily. Remove & Discard patch within 12 hours or as directed by MD 90 patch 3   meloxicam  (MOBIC ) 15 MG tablet TAKE 1 TABLET EVERY DAY 90 tablet 3   Multiple Vitamin (MULTIVITAMIN WITH MINERALS) TABS tablet Take 1 tablet by mouth daily.     omeprazole  (PRILOSEC) 20 MG capsule Take 1 capsule (20 mg total) by mouth daily. 90 capsule 3   OXYGEN  2lpm 2 lpm as needed     pregabalin  (LYRICA ) 300 MG capsule Take 1 capsule (300 mg total) by mouth 2 (two) times daily. 180 capsule 1   sacubitril -valsartan  (ENTRESTO ) 24-26 MG Take 1 tablet by mouth 2 (two) times daily. 180 tablet 3   Tiotropium Bromide -Olodaterol (STIOLTO RESPIMAT ) 2.5-2.5 MCG/ACT AERS INHALE 2 PUFFS INTO THE LUNGS DAILY. 12 g 5   tiZANidine  (ZANAFLEX ) 4 MG tablet TAKE 1 TABLET EVERY 6 HOURS AS NEEDED FOR MUSCLE SPASM(S) 360 tablet 3   traMADol  (ULTRAM ) 50 MG tablet Take 1 tablet (50 mg total) by mouth every 6 (six) hours as needed for severe pain. Take one four times a day as needed for pain 360 tablet 0   UNABLE TO FIND CPAP with o2 2lpm  DME- [facility]     VASCEPA  1 g capsule TAKE 2 CAPSULES BY MOUTH TWICE A DAY 360 capsule 1   Vitamin D , Ergocalciferol , (DRISDOL ) 1.25 MG (50000 UNIT) CAPS capsule TAKE 1 CAPSULE EVERY 7 DAYS 12 capsule 3   No current facility-administered medications on file prior to visit.   Past Medical History:  Diagnosis Date   Anxiety    Back pain    Bilateral swelling of feet    CAD (coronary artery disease), native coronary artery    coronary CTA 01/2022 < 25% plaque in the mid RCA, LAD and OM1 with coronary Ca score of 1.23.   Chronic combined systolic and diastolic CHF (congestive heart failure) (HCC) 10/07/2014   COPD (chronic obstructive pulmonary disease) (HCC)    DCM (dilated cardiomyopathy) (HCC) 04/25/2014   EF 28% by MRI despite maximum medical therapy   Depression    Diabetes mellitus without complication (HCC)    Epilepsy (HCC)    as a child    Fatty liver    Former tobacco use    Hyperlipidemia    Hypertension    Hypothyroidism    Joint pain    Leg swelling    Morbid obesity (HCC)    NICM (nonischemic cardiomyopathy) (HCC)    OSA (obstructive  sleep apnea)    moderate with AHI 26/hr with oxygen  desaturations as low as 70%   Scoliosis    Sleep apnea    SOB (shortness of breath)    Past Surgical History:  Procedure Laterality Date   ABDOMINAL HYSTERECTOMY     ankle artery involved cyst removal     CARDIAC CATHETERIZATION  05/14/2014   normal coronary arteries   CARPAL TUNNEL RELEASE     FASCIOTOMY     1993 for platar fasciitis   harrington rod scoliosis     1981   LEFT HEART CATHETERIZATION WITH CORONARY ANGIOGRAM N/A 05/14/2014   Procedure: LEFT HEART CATHETERIZATION WITH CORONARY ANGIOGRAM;  Surgeon: Wenona Hamilton, MD;  Location: [facility] CATH LAB;  Service: Cardiovascular;  Laterality: N/A;   ROTATOR CUFF REPAIR Right 06/2021   UMBILICAL HERNIA REPAIR     VESICOVAGINAL FISTULA CLOSURE W/ TAH      Family History  Problem Relation Age of Onset   Diabetes Mother    Hyperlipidemia Mother    Obesity Mother    Emphysema Father    Allergies Father    Heart failure Father    Heart disease Father 59       MI   Diabetes Father    Hyperlipidemia Father    Hypertension Father    Thyroid  disease Father    Alcohol abuse Father    Obesity Father    Breast cancer Neg Hx    Social History   Socioeconomic History   Marital status: Married    Spouse name: Not on file   Number of children: Not on file   Years of education: Not on file   Highest education level: Some college, no degree  Occupational History   Occupation: disabled  Tobacco Use   Smoking status: Former    Current packs/day: 0.00    Average packs/day: 1 pack/day for 20.0 years (20.0 ttl pk-yrs)    Types: Cigarettes    Start date: 02/14/1985    Quit date: 02/14/2005    Years since quitting: 18.4   Smokeless tobacco: Never  Vaping Use   Vaping  status: Never Used  Substance and Sexual Activity   Alcohol use: Yes    Alcohol/week: 2.0 standard drinks of alcohol    Types: 2 Shots of liquor per week    Comment: socially   Drug use: No   Sexual activity: Not Currently  Other Topics Concern   Not on file  Social History Narrative   Lives in [place] with spouse and son.   Previously worked as a Comptroller         Social Drivers of Corporate investment banker Strain: Low Risk  (08/26/2022)   Overall Financial Resource Strain (CARDIA)    Difficulty of Paying Living Expenses: Not very hard  Recent Concern: Financial Resource Strain - High Risk (06/23/2022)   Overall Financial Resource Strain (CARDIA)    Difficulty of Paying Living Expenses: Very hard  Food Insecurity: No Food Insecurity (07/17/2023)   Hunger Vital Sign    Worried About Running Out of Food in the Last Year: Never true    Ran Out of Food in the Last Year: Never true  Transportation Needs: No Transportation Needs (07/17/2023)   PRAPARE - Administrator, Civil Service (Medical): No    Lack of Transportation (Non-Medical): No  Physical Activity: Insufficiently Active (08/26/2022)   Exercise Vital Sign    Days of Exercise per Week: 2 days  Minutes of Exercise per Session: 20 min  Stress: No Stress Concern Present (08/26/2022)   [employer] of Occupational Health - Occupational Stress Questionnaire    Feeling of Stress : Only a little  Social Connections: Socially Isolated (08/26/2022)   Social Connection and Isolation Panel [NHANES]    Frequency of Communication with Friends and Family: Once a week    Frequency of Social Gatherings with Friends and Family: Once a week    Attends Religious Services: Never    Diplomatic Services operational officer: No    Attends Engineer, structural: Not on file    Marital Status: Married    Objective:  BP 118/68   Pulse 96   Temp 97.8 F (36.6 C)   Ht 5\' 5"  (1.651 m)   Wt 260 lb  (117.9 kg)   SpO2 92%   BMI 43.27 kg/m      07/17/2023    9:16 AM 06/23/2023   10:17 AM 05/11/2023   10:23 AM  BP/Weight  Systolic BP 118 108 122  Diastolic BP 68 70 70  Wt. (Lbs) 260 259 264.6  BMI 43.27 kg/m2 43.1 kg/m2 44.03 kg/m2    Physical Exam Vitals reviewed.  Constitutional:      Appearance: Normal appearance. She is obese.  Cardiovascular:     Rate and Rhythm: Normal rate and regular rhythm.     Heart sounds: Normal heart sounds.  Pulmonary:     Effort: Pulmonary effort is normal. No respiratory distress.     Breath sounds: Normal breath sounds.  Musculoskeletal:     Comments: Neck nontender. Left shoulder nontender superiorly. Develops paresthesias over left superior shoulder and lateral neck with left lateral rotation.   Neurological:     Mental Status: She is alert and oriented to person, place, and time.  Psychiatric:        Mood and Affect: Mood normal.        Behavior: Behavior normal.     Diabetic Foot Exam - Simple   No data filed      Lab Results  Component Value Date   WBC 7.0 06/20/2023   HGB 13.9 06/20/2023   HCT 43.0 06/20/2023   PLT 215 06/20/2023   GLUCOSE 123 (H) 06/20/2023   CHOL 124 06/20/2023   TRIG 183 (H) 06/20/2023   HDL 36 (L) 06/20/2023   LDLCALC 57 06/20/2023   ALT 20 06/20/2023   AST 19 06/20/2023   NA 147 (H) 06/20/2023   K 4.6 06/20/2023   CL 111 (H) 06/20/2023   CREATININE 0.67 06/20/2023   BUN 26 06/20/2023   CO2 22 06/20/2023   TSH 1.550 08/26/2022   INR 1.0 05/13/2014   HGBA1C 5.7 (H) 06/20/2023   MICROALBUR Negative 09/03/2019      Assessment & Plan:  Cervical myelopathy with cervical radiculopathy [facility]) Assessment & Plan: PREVIOUSLY ABNORMAL MRI OF NECK IN 2022.  PRESCRIPTION: PREDNISONE  50 MG DAILY X 5 DAYS.  CONSIDER PHYSICAL THERAPY, BUT PROCEED WITH REPEAT MRI OF CSPINE DUE TO SIGNIFICANT ABNORMALITIES IN THE PAST AND BL SYMPTOMS (LEFT>RIGHT.)   Orders: -     MR CERVICAL SPINE WO CONTRAST;  Future  Other orders -     predniSONE ; Take 1 tablet (50 mg total) by mouth daily with breakfast.  Dispense: 5 tablet; Refill: 0 -     Amoxicillin ; Take 1 tablet (875 mg total) by mouth 2 (two) times daily for 10 days.  Dispense: 20 tablet; Refill: 0  Meds ordered this encounter  Medications   predniSONE  (DELTASONE ) 50 MG tablet    Sig: Take 1 tablet (50 mg total) by mouth daily with breakfast.    Dispense:  5 tablet    Refill:  0   amoxicillin  (AMOXIL ) 875 MG tablet    Sig: Take 1 tablet (875 mg total) by mouth 2 (two) times daily for 10 days.    Dispense:  20 tablet    Refill:  0    Orders Placed This Encounter  Procedures   MR Cervical Spine Wo Contrast     Follow-up: No follow-ups on file.   I,Marla I Leal-Borjas,acting as a scribe for Mercy Stall, MD.,have documented all relevant documentation on the behalf of Mercy Stall, MD,as directed by  Mercy Stall, MD while in the presence of Mercy Stall, MD.   An After Visit Summary was printed and given to the patient.  Mercy Stall, MD [facility] [phone]

## 2023-07-18 DIAGNOSIS — D2262 Melanocytic nevi of left upper limb, including shoulder: Secondary | ICD-10-CM | POA: Diagnosis not present

## 2023-07-18 DIAGNOSIS — D2261 Melanocytic nevi of right upper limb, including shoulder: Secondary | ICD-10-CM | POA: Diagnosis not present

## 2023-07-18 DIAGNOSIS — D225 Melanocytic nevi of trunk: Secondary | ICD-10-CM | POA: Diagnosis not present

## 2023-07-18 DIAGNOSIS — L720 Epidermal cyst: Secondary | ICD-10-CM | POA: Diagnosis not present

## 2023-07-18 DIAGNOSIS — D2271 Melanocytic nevi of right lower limb, including hip: Secondary | ICD-10-CM | POA: Diagnosis not present

## 2023-07-18 DIAGNOSIS — D2222 Melanocytic nevi of left ear and external auricular canal: Secondary | ICD-10-CM | POA: Diagnosis not present

## 2023-07-18 DIAGNOSIS — H0261 Xanthelasma of right upper eyelid: Secondary | ICD-10-CM | POA: Diagnosis not present

## 2023-07-18 DIAGNOSIS — L570 Actinic keratosis: Secondary | ICD-10-CM | POA: Diagnosis not present

## 2023-07-26 ENCOUNTER — Ambulatory Visit (HOSPITAL_COMMUNITY)
Admission: RE | Admit: 2023-07-26 | Discharge: 2023-07-26 | Disposition: A | Discharge status: Home / Self Care | Source: Ambulatory Visit | Attending: Cardiology | Admitting: Cardiology

## 2023-07-26 DIAGNOSIS — G4733 Obstructive sleep apnea (adult) (pediatric): Secondary | ICD-10-CM | POA: Diagnosis not present

## 2023-07-26 DIAGNOSIS — I420 Dilated cardiomyopathy: Secondary | ICD-10-CM | POA: Diagnosis not present

## 2023-07-26 DIAGNOSIS — J9611 Chronic respiratory failure with hypoxia: Secondary | ICD-10-CM | POA: Diagnosis not present

## 2023-07-26 LAB — PULMONARY FUNCTION TEST
DL/VA % pred: 69 %
DL/VA: 2.88 ml/min/mmHg/L
DLCO unc % pred: 64 %
DLCO unc: 13.41 ml/min/mmHg
FEF 25-75 Post: 0.46 L/s
FEF 25-75 Pre: 0.38 L/s
FEF2575-%Change-Post: 20 %
FEF2575-%Pred-Post: 20 %
FEF2575-%Pred-Pre: 16 %
FEV1-%Change-Post: 8 %
FEV1-%Pred-Post: 44 %
FEV1-%Pred-Pre: 41 %
FEV1-Post: 1.16 L
FEV1-Pre: 1.07 L
FEV1FVC-%Change-Post: 1 %
FEV1FVC-%Pred-Pre: 51 %
FEV6-%Change-Post: 4 %
FEV6-%Pred-Post: 76 %
FEV6-%Pred-Pre: 72 %
FEV6-Post: 2.46 L
FEV6-Pre: 2.34 L
FEV6FVC-%Change-Post: -1 %
FEV6FVC-%Pred-Post: 88 %
FEV6FVC-%Pred-Pre: 90 %
FVC-%Change-Post: 6 %
FVC-%Pred-Post: 86 %
FVC-%Pred-Pre: 80 %
FVC-Post: 2.88 L
FVC-Pre: 2.7 L
Post FEV1/FVC ratio: 40 %
Post FEV6/FVC ratio: 85 %
Pre FEV1/FVC ratio: 40 %
Pre FEV6/FVC Ratio: 87 %
RV % pred: 114 %
RV: 2.41 L
TLC % pred: 105 %
TLC: 5.51 L

## 2023-07-26 MED ORDER — ALBUTEROL SULFATE (2.5 MG/3ML) 0.083% IN NEBU
2.5000 mg | INHALATION_SOLUTION | Freq: Once | RESPIRATORY_TRACT | Status: AC
  Administered 2023-07-26: 2.5 mg via RESPIRATORY_TRACT

## 2023-07-27 DIAGNOSIS — H35373 Puckering of macula, bilateral: Secondary | ICD-10-CM | POA: Diagnosis not present

## 2023-07-30 ENCOUNTER — Ambulatory Visit: Payer: Self-pay | Admitting: Cardiology

## 2023-07-30 DIAGNOSIS — J418 Mixed simple and mucopurulent chronic bronchitis: Secondary | ICD-10-CM

## 2023-07-30 DIAGNOSIS — J449 Chronic obstructive pulmonary disease, unspecified: Secondary | ICD-10-CM

## 2023-07-31 NOTE — Telephone Encounter (Signed)
-----   Message from Gaylyn Keas sent at 07/30/2023  3:48 PM EDT ----- PFTs with severe COPD - please get in with pulmonary ----- Message ----- From: Interface, Lab In [facility] Sent: 07/26/2023  11:56 AM EDT To: Jacqueline Matsu, MD

## 2023-07-31 NOTE — Telephone Encounter (Signed)
 Attempted to call patient to discuss results of PFT's, no answer. LM stating that I would sent [facility] message. Pulmonology referral placed.

## 2023-08-03 ENCOUNTER — Ambulatory Visit (HOSPITAL_BASED_OUTPATIENT_CLINIC_OR_DEPARTMENT_OTHER)
Admission: RE | Admit: 2023-08-03 | Discharge: 2023-08-03 | Discharge status: Home / Self Care | Source: Ambulatory Visit | Attending: Family Medicine | Admitting: Family Medicine

## 2023-08-03 DIAGNOSIS — M5412 Radiculopathy, cervical region: Secondary | ICD-10-CM | POA: Diagnosis not present

## 2023-08-03 DIAGNOSIS — M4803 Spinal stenosis, cervicothoracic region: Secondary | ICD-10-CM | POA: Diagnosis not present

## 2023-08-03 DIAGNOSIS — M4802 Spinal stenosis, cervical region: Secondary | ICD-10-CM | POA: Diagnosis not present

## 2023-08-03 DIAGNOSIS — M4722 Other spondylosis with radiculopathy, cervical region: Secondary | ICD-10-CM | POA: Diagnosis not present

## 2023-08-03 DIAGNOSIS — G959 Disease of spinal cord, unspecified: Secondary | ICD-10-CM

## 2023-08-03 DIAGNOSIS — M5011 Cervical disc disorder with radiculopathy,  high cervical region: Secondary | ICD-10-CM | POA: Diagnosis not present

## 2023-08-14 ENCOUNTER — Encounter: Payer: Self-pay | Admitting: Family Medicine

## 2023-08-15 ENCOUNTER — Ambulatory Visit: Payer: Self-pay | Admitting: Family Medicine

## 2023-08-16 ENCOUNTER — Ambulatory Visit (INDEPENDENT_AMBULATORY_CARE_PROVIDER_SITE_OTHER): Admitting: Family Medicine

## 2023-08-16 ENCOUNTER — Encounter: Payer: Self-pay | Admitting: Family Medicine

## 2023-08-16 VITALS — BP 118/68 | HR 81 | Temp 98.0°F | Ht 65.0 in | Wt 262.0 lb

## 2023-08-16 DIAGNOSIS — J4550 Severe persistent asthma, uncomplicated: Secondary | ICD-10-CM | POA: Diagnosis not present

## 2023-08-16 DIAGNOSIS — M5412 Radiculopathy, cervical region: Secondary | ICD-10-CM

## 2023-08-16 DIAGNOSIS — J418 Mixed simple and mucopurulent chronic bronchitis: Secondary | ICD-10-CM | POA: Diagnosis not present

## 2023-08-16 DIAGNOSIS — G959 Disease of spinal cord, unspecified: Secondary | ICD-10-CM | POA: Diagnosis not present

## 2023-08-16 NOTE — Progress Notes (Signed)
 Subjective:  Patient ID: Judy [name], female    DOB: 06-27-1960  Age: 63 y.o. MRN: [id number]  Chief Complaint  Patient presents with   COPD    HPI:  Discussed the use of AI scribe software for clinical note transcription with the patient, who gave verbal consent to proceed.  History of Present Illness   Judy [name] is a 63 year old female with a history of spinal surgery and pulmonary issues who presents with neck pain and respiratory management.  Cervical radicular pain - Persistent neck pain radiating into the shoulder - Pain worsens with head rotation to the left - History of spinal surgery with Herrington rod placement in the lumbar thoracic region - Hand pain intensified following an episode of shingles; unclear if shingles continue to contribute to symptoms  Respiratory symptoms and management - History of abnormal pulmonary function tests. Severe COPD.  - Current medication regimen includes Stiolto, albuterol , and albuterol  nebulizer.  - Dyspnea with walking 20 feet.  - Decreased oxygen  saturation during physical activity, such as stationary biking - Uses supplemental oxygen  to maintain oxygen  levels during exertion. - Mild cough - Desires improved breathing to facilitate physical activity          07/17/2023    9:18 AM 03/23/2023   10:51 AM 12/08/2022    3:22 PM 08/30/2022    3:41 PM 04/15/2022   10:10 AM  Depression screen PHQ 2/9  Decreased Interest 0 0 0 1 0  Down, Depressed, Hopeless 0 0 0 1 0  PHQ - 2 Score 0 0 0 2 0  Altered sleeping 0 1 2 2 3   Tired, decreased energy 1 1 1 2  0  Change in appetite 0 1 0 1 0  Feeling bad or failure about yourself  0 1 0 0 0  Trouble concentrating 0 1 1  0  Moving slowly or fidgety/restless 0 1 0 0 0  Suicidal thoughts 0 0  0 0  PHQ-9 Score 1 6 4 7 3   Difficult doing work/chores Not difficult at all Not difficult at all Somewhat difficult  Not difficult at all        03/23/2023   10:51 AM  Fall Risk   Falls in  the past year? 0  Number falls in past yr: 0  Injury with Fall? 0  Risk for fall due to : No Fall Risks  Follow up Falls evaluation completed    Patient Care Team: Sherre Clapper, MD as PCP - General (Family Medicine) Nyle Rankin POUR, [facility] (Inactive) (Pharmacist) Maryl Lynwood Raddle., OD (Optometry) Shlomo Wilbert SAUNDERS, MD as Consulting Physician (Cardiology) Larnell Purchase, MD as Referring Physician (Orthopedic Surgery) Darlean Ozell NOVAK, MD as Consulting Physician (Pulmonary Disease) Pecen, Paula E, MD as Consulting Physician (Ophthalmology)   Review of Systems  Constitutional:  Negative for chills, fatigue and fever.  Respiratory:  Positive for shortness of breath. Negative for cough.   Cardiovascular:  Negative for chest pain.  Musculoskeletal:  Negative for myalgias.  Neurological:  Negative for dizziness, light-headedness and headaches.    Current Outpatient Medications on File Prior to Visit  Medication Sig Dispense Refill   aspirin  81 MG tablet Take 81 mg by mouth at bedtime.      atorvastatin  (LIPITOR) 20 MG tablet TAKE 1 TABLET EVERY DAY 90 tablet 3   buPROPion  (WELLBUTRIN  XL) 300 MG 24 hr tablet TAKE 1 TABLET EVERY DAY 90 tablet 3   carvedilol  (COREG ) 6.25 MG tablet TAKE 1 TABLET  TWICE DAILY 180 tablet 2   clonazePAM  (KLONOPIN ) 0.5 MG tablet Take 1 tablet (0.5 mg total) by mouth 2 (two) times daily as needed for anxiety (panic attacks). 90 tablet 0   Coenzyme Q10 (CO Q 10) 100 MG CAPS Take 200 mg by mouth daily.     diphenhydrAMINE (BENADRYL) 25 MG tablet Take 50 mg by mouth at bedtime as needed.     Dulaglutide  (TRULICITY ) 3 MG/0.5ML SOPN Inject 3 mg as directed once a week. 2 mL 0   empagliflozin (JARDIANCE) 10 MG TABS tablet Take by mouth daily.     fluticasone  (FLONASE ) 50 MCG/ACT nasal spray Place 2 sprays into both nostrils daily. 48 mL 2   levothyroxine  (SYNTHROID ) 75 MCG tablet TAKE 1 TABLET EVERY DAY 90 tablet 3   lidocaine  (LIDODERM ) 5 % Place 3 patches onto the skin  daily. Remove & Discard patch within 12 hours or as directed by MD 90 patch 3   meloxicam  (MOBIC ) 15 MG tablet TAKE 1 TABLET EVERY DAY 90 tablet 3   Multiple Vitamin (MULTIVITAMIN WITH MINERALS) TABS tablet Take 1 tablet by mouth daily.     omeprazole  (PRILOSEC) 20 MG capsule Take 1 capsule (20 mg total) by mouth daily. 90 capsule 3   OXYGEN  2lpm 2 lpm as needed     predniSONE  (DELTASONE ) 50 MG tablet Take 1 tablet (50 mg total) by mouth daily with breakfast. 5 tablet 0   pregabalin  (LYRICA ) 300 MG capsule Take 1 capsule (300 mg total) by mouth 2 (two) times daily. 180 capsule 1   sacubitril -valsartan  (ENTRESTO ) 24-26 MG Take 1 tablet by mouth 2 (two) times daily. 180 tablet 3   tiZANidine  (ZANAFLEX ) 4 MG tablet TAKE 1 TABLET EVERY 6 HOURS AS NEEDED FOR MUSCLE SPASM(S) 360 tablet 3   traMADol  (ULTRAM ) 50 MG tablet Take 1 tablet (50 mg total) by mouth every 6 (six) hours as needed for severe pain. Take one four times a day as needed for pain 360 tablet 0   UNABLE TO FIND CPAP with o2 2lpm  DME- [facility]     VASCEPA  1 g capsule TAKE 2 CAPSULES BY MOUTH TWICE A DAY 360 capsule 1   Vitamin D , Ergocalciferol , (DRISDOL ) 1.25 MG (50000 UNIT) CAPS capsule TAKE 1 CAPSULE EVERY 7 DAYS 12 capsule 3   No current facility-administered medications on file prior to visit.   Past Medical History:  Diagnosis Date   Anxiety    Back pain    Bilateral swelling of feet    CAD (coronary artery disease), native coronary artery    coronary CTA 01/2022 < 25% plaque in the mid RCA, LAD and OM1 with coronary Ca score of 1.23.   Chronic combined systolic and diastolic CHF (congestive heart failure) (HCC) 10/07/2014   COPD (chronic obstructive pulmonary disease) (HCC)    DCM (dilated cardiomyopathy) (HCC) 04/25/2014   EF 28% by MRI despite maximum medical therapy   Depression    Diabetes mellitus without complication (HCC)    Epilepsy (HCC)    as a child   Fatty liver    Former tobacco use    Hyperlipidemia     Hypertension    Hypothyroidism    Joint pain    Leg swelling    Morbid obesity (HCC)    NICM (nonischemic cardiomyopathy) (HCC)    OSA (obstructive sleep apnea)    moderate with AHI 26/hr with oxygen  desaturations as low as 70%   Scoliosis    Sleep apnea  SOB (shortness of breath)    Past Surgical History:  Procedure Laterality Date   ABDOMINAL HYSTERECTOMY     ankle artery involved cyst removal     CARDIAC CATHETERIZATION  05/14/2014   normal coronary arteries   CARPAL TUNNEL RELEASE     FASCIOTOMY     1993 for platar fasciitis   harrington rod scoliosis     1981   LEFT HEART CATHETERIZATION WITH CORONARY ANGIOGRAM N/A 05/14/2014   Procedure: LEFT HEART CATHETERIZATION WITH CORONARY ANGIOGRAM;  Surgeon: Deatrice DELENA Cage, MD;  Location: [facility] CATH [facility];  Service: Cardiovascular;  Laterality: N/A;   ROTATOR CUFF REPAIR Right 06/2021   UMBILICAL HERNIA REPAIR     VESICOVAGINAL FISTULA CLOSURE W/ TAH      Family History  Problem Relation Age of Onset   Diabetes Mother    Hyperlipidemia Mother    Obesity Mother    Emphysema Father    Allergies Father    Heart failure Father    Heart disease Father 92       MI   Diabetes Father    Hyperlipidemia Father    Hypertension Father    Thyroid  disease Father    Alcohol abuse Father    Obesity Father    Breast cancer Neg Hx    Social History   Socioeconomic History   Marital status: Married    Spouse name: Not on file   Number of children: Not on file   Years of education: Not on file   Highest education level: Some college, no degree  Occupational History   Occupation: disabled  Tobacco Use   Smoking status: Former    Current packs/day: 0.00    Average packs/day: 1 pack/day for 20.0 years (20.0 ttl pk-yrs)    Types: Cigarettes    Start date: 02/14/1985    Quit date: 02/14/2005    Years since quitting: 18.5   Smokeless tobacco: Never  Vaping Use   Vaping status: Never Used  Substance and Sexual Activity   Alcohol use:  Yes    Alcohol/week: 2.0 standard drinks of alcohol    Types: 2 Shots of liquor per week    Comment: socially   Drug use: No   Sexual activity: Not Currently  Other Topics Concern   Not on file  Social History Narrative   Lives in [place] with spouse and son.   Previously worked as a Comptroller         Social Drivers of Corporate investment banker Strain: Low Risk  (08/26/2022)   Overall Financial Resource Strain (CARDIA)    Difficulty of Paying Living Expenses: Not very hard  Recent Concern: Financial Resource Strain - High Risk (06/23/2022)   Overall Financial Resource Strain (CARDIA)    Difficulty of Paying Living Expenses: Very hard  Food Insecurity: No Food Insecurity (07/17/2023)   Hunger Vital Sign    Worried About Running Out of Food in the Last Year: Never true    Ran Out of Food in the Last Year: Never true  Transportation Needs: No Transportation Needs (07/17/2023)   PRAPARE - Administrator, Civil Service (Medical): No    Lack of Transportation (Non-Medical): No  Physical Activity: Insufficiently Active (08/26/2022)   Exercise Vital Sign    Days of Exercise per Week: 2 days    Minutes of Exercise per Session: 20 min  Stress: No Stress Concern Present (08/26/2022)   [employer] of Occupational Health - Occupational Stress Questionnaire  Feeling of Stress : Only a little  Social Connections: Socially Isolated (08/26/2022)   Social Connection and Isolation Panel    Frequency of Communication with Friends and Family: Once a week    Frequency of Social Gatherings with Friends and Family: Once a week    Attends Religious Services: Never    Diplomatic Services operational officer: No    Attends Engineer, structural: Not on file    Marital Status: Married    Objective:  BP 118/68   Pulse 81   Temp 98 F (36.7 C)   Ht 5' 5 (1.651 m)   Wt 262 lb (118.8 kg)   SpO2 92%   BMI 43.60 kg/m      08/16/2023    2:15 PM 07/17/2023     9:16 AM 06/23/2023   10:17 AM  BP/Weight  Systolic BP 118 118 108  Diastolic BP 68 68 70  Wt. (Lbs) 262 260 259  BMI 43.6 kg/m2 43.27 kg/m2 43.1 kg/m2    Physical Exam Vitals reviewed.  Constitutional:      Appearance: Normal appearance. She is obese.  Cardiovascular:     Rate and Rhythm: Normal rate and regular rhythm.     Heart sounds: Normal heart sounds.  Pulmonary:     Effort: Pulmonary effort is normal. No respiratory distress.     Breath sounds: Normal breath sounds.  Neurological:     Mental Status: She is alert and oriented to person, place, and time.  Psychiatric:        Mood and Affect: Mood normal.        Behavior: Behavior normal.         Lab Results  Component Value Date   WBC 7.0 06/20/2023   HGB 13.9 06/20/2023   HCT 43.0 06/20/2023   PLT 215 06/20/2023   GLUCOSE 123 (H) 06/20/2023   CHOL 124 06/20/2023   TRIG 183 (H) 06/20/2023   HDL 36 (L) 06/20/2023   LDLCALC 57 06/20/2023   ALT 20 06/20/2023   AST 19 06/20/2023   NA 147 (H) 06/20/2023   K 4.6 06/20/2023   CL 111 (H) 06/20/2023   CREATININE 0.67 06/20/2023   BUN 26 06/20/2023   CO2 22 06/20/2023   TSH 1.550 08/26/2022   INR 1.0 05/13/2014   HGBA1C 5.7 (H) 06/20/2023   MICROALBUR Negative 09/03/2019      Assessment & Plan:   Cervical myelopathy with cervical radiculopathy (HCC) Assessment & Plan: Cervical radiculopathy with shoulder symptoms, complicated by lumbar thoracic Harrington rod. Possible exacerbation by shingles history. Interventional treatments discussed. - Refer to Dr. Alm Molt for evaluation and potential interventional treatment options.  Orders: -     Ambulatory referral to Neurosurgery  Mixed simple and mucopurulent chronic bronchitis (HCC) Assessment & Plan: COPD with abnormal pulmonary function tests. Current medication includes Stiolto. Airsupra  inhaler introduced to replace albuterol . Emphasized oxygen  use during exercise. - Prescribe Airsupra  inhaler to  replace albuterol  as the rescue inhaler. - Instruct on proper use of Airsupra  inhaler and importance of rinsing mouth after use. - Advise use of oxygen  during exercise to maintain oxygen  levels.  Orders: -     Airsupra ; Inhale 2 puffs into the lungs 4 (four) times daily as needed (dyspnea/wheezing).  Dispense: 32.1 g; Refill: 3 -     Trelegy Ellipta ; Inhale 1 puff into the lungs daily.  Dispense: 180 each; Refill: 3  Severe persistent asthma without complication -     Airsupra ; Inhale 2 puffs  into the lungs 4 (four) times daily as needed (dyspnea/wheezing).  Dispense: 32.1 g; Refill: 3 -     Trelegy Ellipta ; Inhale 1 puff into the lungs daily.  Dispense: 180 each; Refill: 3   Follow-up: Return in about 1 month (around 09/18/2023) for chronic follow up.   I,Katherina A Bramblett,acting as a scribe for Abigail Free, MD.,have documented all relevant documentation on the behalf of Abigail Free, MD,as directed by  Abigail Free, MD while in the presence of Abigail Free, MD.   An After Visit Summary was printed and given to the patient.  I attest that I have reviewed this visit and agree with the plan scribed by my staff.   Abigail Free, MD [facility] [phone]

## 2023-08-19 DIAGNOSIS — J4550 Severe persistent asthma, uncomplicated: Secondary | ICD-10-CM | POA: Insufficient documentation

## 2023-08-19 MED ORDER — AIRSUPRA 90-80 MCG/ACT IN AERO: 2.0000 | INHALATION_SPRAY | Freq: Four times a day (QID) | RESPIRATORY_TRACT | 3 refills | Status: AC | PRN

## 2023-08-19 MED ORDER — AIRSUPRA 90-80 MCG/ACT IN AERO: 2.0000 | INHALATION_SPRAY | Freq: Four times a day (QID) | RESPIRATORY_TRACT | Status: DC | PRN

## 2023-08-19 MED ORDER — TRELEGY ELLIPTA 100-62.5-25 MCG/ACT IN AEPB: 1.0000 | INHALATION_SPRAY | Freq: Every day | RESPIRATORY_TRACT | 3 refills | Status: DC

## 2023-08-19 MED ORDER — TRELEGY ELLIPTA 100-62.5-25 MCG/ACT IN AEPB: 1.0000 | INHALATION_SPRAY | Freq: Every day | RESPIRATORY_TRACT | Status: DC

## 2023-08-19 NOTE — Assessment & Plan Note (Addendum)
 COPD with abnormal pulmonary function tests. Current medication includes Stiolto. Airsupra  inhaler introduced to replace albuterol . Emphasized oxygen  use during exercise. - Prescribe Airsupra  inhaler to replace albuterol  as the rescue inhaler. - Instruct on proper use of Airsupra  inhaler and importance of rinsing mouth after use. - Advise use of oxygen  during exercise to maintain oxygen  levels.

## 2023-08-19 NOTE — Patient Instructions (Addendum)
 COPD with abnormal pulmonary function tests.  Change stiolto to trelegy one inhalation daily.  Change Airsupra  inhaler to replace albuterol . Emphasized oxygen  use during exercise. - Prescribe Airsupra  inhaler to replace albuterol  as the rescue inhaler. - Instruct on proper use of Airsupra  inhaler and importance of rinsing mouth after use. - Advise use of oxygen  during exercise to maintain oxygen  levels.  Refer to Dr. Alm Molt for cervical radiculopathy.

## 2023-08-19 NOTE — Assessment & Plan Note (Signed)
 Cervical radiculopathy with shoulder symptoms, complicated by lumbar thoracic Harrington rod. Possible exacerbation by shingles history. Interventional treatments discussed. - Refer to Dr. Alm Molt for evaluation and potential interventional treatment options.

## 2023-08-24 ENCOUNTER — Other Ambulatory Visit: Payer: Self-pay

## 2023-08-24 ENCOUNTER — Other Ambulatory Visit: Payer: Self-pay | Admitting: Family Medicine

## 2023-08-24 DIAGNOSIS — M542 Cervicalgia: Secondary | ICD-10-CM

## 2023-08-25 MED ORDER — PREGABALIN 300 MG PO CAPS: 300.0000 mg | ORAL_CAPSULE | Freq: Two times a day (BID) | ORAL | 1 refills | Status: DC

## 2023-08-28 ENCOUNTER — Other Ambulatory Visit: Payer: Self-pay | Admitting: Family Medicine

## 2023-09-01 ENCOUNTER — Encounter: Payer: Self-pay | Admitting: Advanced Practice Midwife

## 2023-09-05 ENCOUNTER — Other Ambulatory Visit: Payer: Self-pay

## 2023-09-05 DIAGNOSIS — J4550 Severe persistent asthma, uncomplicated: Secondary | ICD-10-CM

## 2023-09-05 DIAGNOSIS — J418 Mixed simple and mucopurulent chronic bronchitis: Secondary | ICD-10-CM

## 2023-09-05 MED ORDER — TRELEGY ELLIPTA 100-62.5-25 MCG/ACT IN AEPB: 1.0000 | INHALATION_SPRAY | Freq: Every day | RESPIRATORY_TRACT | 5 refills | Status: AC

## 2023-09-14 DIAGNOSIS — M542 Cervicalgia: Secondary | ICD-10-CM | POA: Diagnosis not present

## 2023-09-14 DIAGNOSIS — Z6841 Body Mass Index (BMI) 40.0 and over, adult: Secondary | ICD-10-CM | POA: Diagnosis not present

## 2023-09-16 NOTE — Progress Notes (Unsigned)
 Patient ID: Judy [name], female   DOB: Jul 22, 1960  MRN: [id number]    Brief patient profile:  20 yowf MM/quit smoking 2007  @  150 lb  with cough that resolved and no resp problems until winter 2015 with doe x walking the dog s much in terms of cough then 10/04/13 sudden sense she couldn't get a breath and coughed up blood x one tsp plus slt green mucus  > better with saba and started on inhalers/ abx  >  better and pred taper and dx of GOLD II copd was made 11/2013 and non ischemic cardiomyopathy 05/14/14.     History of Present Illness  10/08/2013 1st [facility] office visit/ Judy [name] Chief Complaint  Patient presents with   Pulmonary Consult    Referred by Mariano Cox-sob with exertion x 6-8 mths.,worse since 4 days ago,occass. cough-tsp. of blood 4 days ago,usually unprod.,no wheezing,midchest tightness,no fcs,Had neb. since yesterday(used 2x yesterday)   baseline = 175ft   to mailbox and sob when gets there.  rec Clonidine  0.1 mg twice daily until you return  Plan A = automatic = symbiocort 160 Take 2 puffs first thing in am and then another 2 puffs about 12 hours later.  Plan B = Only use your albuterol  (proair )as a rescue medication    Plan C = nebulizer albuterol , ok to use up to every 4 hours if can't get relief from Plan B GERD diet     LHC 05/14/14  1. No significant coronary artery disease  2. Moderate to severely reduced LV systolic function due to nonischemic cardiomyopathy.    3. Moderately elevated left ventricular end-diastolic pressure.    08/29/2017  f/u ov/Judy [name] re:  GOLD III/ 02 dep hs with cpap and  ? With exertion  Chief Complaint  Patient presents with   Follow-up    Breathing is some better but not back to baseline.  She has had sore throat and increased cough for the past few days. Cough has been non prod.    Dyspnea:  MMRC3 = some better but still  can't walk 100 yards even at a slow pace at a flat grade s stopping due to sob but admits not good  about checking sats when walking    Cough: dry day > noct new x sev months assoc with hoarseness not post nasal drip/ wheeze  SABA use: none 02: 2lpm at hs and prn daytime  rec Try prilosec otc 20mg   Take 30-60 min before first meal of the day and Pepcid  ac (famotidine ) 20 mg one @  bedtime until cough is completely gone for at least a week   Adjust 02 to sats > 90% at all times especially while walking to help you burn fat and get into negative balance     09/09/2020  f/u ov/Judy [name] re: doe p covid/ GOLD III maint on stiolto and noct plus prn 02  Chief Complaint  Patient presents with   COPD  Dyspnea:  50 ft with standing walker not using 02 Cough: very little  Sleeping: cpap and 2pm flat 1 pillow  SABA use: none  02: 2lpm   Covid status:   vax x 3 and omicron may 2022 Rec Make sure you check your oxygen  saturation  at your highest level of activity  to be sure it stays over 90%   To get the most out of exercise, you need to be continuously aware that you are short of breath  No change in  medications        06/07/2021  f/u ov/Judy [name] re: GOLD 3    maint on stiolto/ needs surgical clearance   Chief Complaint  Patient presents with   Follow-up    Needing right shoulder surgery- Dr. Larnell- needs pulmonary clearance. She has already received clearance from PCP and Cardiology.   Dyspnea: up walker at mall s 02  Cough: none  Sleeping:   sleeps flat bed with one pillow SABA use: none  02: 2lpm hs - none daytime I can tell when it's low and it's not when I walk  Covid status:   vax all but the covalent  Rec We walk you today to clear you for surgery  I will clear you for surgery once I have reviewed your cxr. > ok     09/19/2023  f/u ov/Judy [name] re: COPD GOLD 3 / 02 hs and prn  maint on trelegy 100   Chief Complaint  Patient presents with   COPD    Pt was given a sample of trelegy from pcp, which helped with breathing. PFT follow-up   Dyspnea:   recumbent bike x 15-20 three times per  week / rides scooter due to back  Cough: none  Sleeping: falt bed one pillow  resp cc  SABA use: once or twice a week saba  02: 2lpm at hs  / daytime none and up to 2lpm bike    No obvious day to day or daytime variability or assoc excess/ purulent sputum or mucus plugs or hemoptysis or cp or chest tightness, subjective wheeze or overt  hb symptoms.    Also denies any obvious fluctuation of symptoms with weather or environmental changes or other aggravating or alleviating factors except as outlined above   No unusual exposure hx or h/o childhood pna/ asthma or knowledge of premature birth.  Current Allergies, Complete Past Medical History, Past Surgical History, Family History, and Social History were reviewed in [employer] record.  ROS  The following are not active complaints unless bolded Hoarseness, sore throat since started trelegy , dysphagia, dental problems, itching, sneezing,  nasal congestion or discharge of excess mucus or purulent secretions, ear ache,   fever, chills, sweats, unintended wt loss or wt gain, classically pleuritic or exertional cp,  orthopnea pnd or arm/hand swelling  or leg swelling, presyncope, palpitations, abdominal pain, anorexia, nausea, vomiting, diarrhea  or change in bowel habits or change in bladder habits, change in stools or change in urine, dysuria, hematuria,  rash, arthralgias, visual complaints, headache, numbness, weakness or ataxia or problems with walking or coordination,  change in mood or  memory.        Current Meds  Medication Sig   Albuterol -Budesonide  (AIRSUPRA ) 90-80 MCG/ACT AERO Inhale 2 puffs into the lungs 4 (four) times daily as needed (dyspnea/wheezing).   aspirin  81 MG tablet Take 81 mg by mouth at bedtime.    atorvastatin  (LIPITOR) 20 MG tablet TAKE 1 TABLET EVERY DAY   buPROPion  (WELLBUTRIN  XL) 300 MG 24 hr tablet TAKE 1 TABLET EVERY DAY   carvedilol  (COREG ) 6.25 MG tablet TAKE 1 TABLET TWICE DAILY    clonazePAM  (KLONOPIN ) 0.5 MG tablet Take 1 tablet (0.5 mg total) by mouth 2 (two) times daily as needed for anxiety (panic attacks).   Coenzyme Q10 (CO Q 10) 100 MG CAPS Take 200 mg by mouth daily.   diphenhydrAMINE (BENADRYL) 25 MG tablet Take 50 mg by mouth at bedtime as needed.   Dulaglutide  (TRULICITY ) 3  MG/0.5ML SOPN Inject 3 mg as directed once a week.   empagliflozin (JARDIANCE) 10 MG TABS tablet Take by mouth daily.   fluticasone  (FLONASE ) 50 MCG/ACT nasal spray Place 2 sprays into both nostrils daily.   Fluticasone -Umeclidin-Vilant (TRELEGY ELLIPTA ) 100-62.5-25 MCG/ACT AEPB Inhale 1 puff into the lungs daily.   levothyroxine  (SYNTHROID ) 75 MCG tablet TAKE 1 TABLET EVERY DAY   lidocaine  (LIDODERM ) 5 % Place 3 patches onto the skin daily. Remove & Discard patch within 12 hours or as directed by MD   meloxicam  (MOBIC ) 15 MG tablet TAKE 1 TABLET EVERY DAY   Multiple Vitamin (MULTIVITAMIN WITH MINERALS) TABS tablet Take 1 tablet by mouth daily.   omeprazole  (PRILOSEC) 20 MG capsule Take 1 capsule (20 mg total) by mouth daily.   OXYGEN  2lpm 2 lpm as needed   predniSONE  (DELTASONE ) 50 MG tablet Take 1 tablet (50 mg total) by mouth daily with breakfast.   pregabalin  (LYRICA ) 300 MG capsule TAKE 1 CAPSULE TWICE DAILY   sacubitril -valsartan  (ENTRESTO ) 24-26 MG Take 1 tablet by mouth 2 (two) times daily.   tiZANidine  (ZANAFLEX ) 4 MG tablet TAKE 1 TABLET EVERY 6 HOURS AS NEEDED FOR MUSCLE SPASM(S)   traMADol  (ULTRAM ) 50 MG tablet Take 1 tablet (50 mg total) by mouth every 6 (six) hours as needed for severe pain. Take one four times a day as needed for pain   UNABLE TO FIND CPAP with o2 2lpm  DME- [facility]   VASCEPA  1 g capsule TAKE 2 CAPSULES BY MOUTH TWICE A DAY   Vitamin D , Ergocalciferol , (DRISDOL ) 1.25 MG (50000 UNIT) CAPS capsule TAKE 1 CAPSULE EVERY 7 DAYS                   Objective:   Physical Exam  Wts  09/19/2023       256   06/07/2021     271  09/09/2020     280  07/22/2020        284 07/08/2019     295  12/31/2018   293  11/27/2013     237 >   01/08/2014  236 > 01/21/2014 241 >235 02/21/2014 > 02/26/2014  232 > 03/27/2014  236 >236 04/10/2014 > 05/22/2014   237 >  08/21/2014 245 > 10/09/2014 257 >  01/12/2015   268 > 07/14/2015  278 > 01/14/2016  292  > 07/18/2017   301  > 08/29/2017  303 > 12/04/2017  298 > 06/26/2018  291   Vital signs reviewed  09/19/2023  - Note at rest 02 sats  93% on RA   General appearance:    pleasant MO (by BMI)     HEENT : Oropharynx  clear   Nasal turbinates nl    NECK :  without  apparent JVD/ palpable Nodes/TM    LUNGS: no acc muscle use,  Mild barrel  contour chest wall with bilateral  Distant bs s audible wheeze and  without cough on insp or exp maneuvers  and mild  Hyperresonant  to  percussion bilaterally     CV:  RRR  no s3 or murmur or increase in P2, and no edema   ABD: Obese soft and nontender    MS:  Nl gait/ ext warm without deformities Or obvious joint restrictions  calf tenderness, cyanosis or clubbing     SKIN: warm and dry without lesions    NEURO:  alert, approp, nl sensorium with  no motor or cerebellar deficits apparent.  I personally reviewed images and agree with radiology impression as follows:  CXR:   pa and lateral 06/09/23 No acute cardiopulmonary process.     Assessment:

## 2023-09-19 ENCOUNTER — Encounter: Payer: Self-pay | Admitting: Internal Medicine

## 2023-09-19 ENCOUNTER — Ambulatory Visit: Admitting: Internal Medicine

## 2023-09-19 VITALS — BP 92/60 | HR 81 | Temp 97.9°F | Ht 65.0 in | Wt 256.0 lb

## 2023-09-19 DIAGNOSIS — Z87891 Personal history of nicotine dependence: Secondary | ICD-10-CM

## 2023-09-19 DIAGNOSIS — J449 Chronic obstructive pulmonary disease, unspecified: Secondary | ICD-10-CM

## 2023-09-19 DIAGNOSIS — Z6841 Body Mass Index (BMI) 40.0 and over, adult: Secondary | ICD-10-CM

## 2023-09-19 DIAGNOSIS — J9611 Chronic respiratory failure with hypoxia: Secondary | ICD-10-CM

## 2023-09-19 NOTE — Assessment & Plan Note (Signed)
 Walk with desats 88% 04/10/2014 >begin O2 w/ act 2 l/m  -07/14/2015  Walked RA x 3 laps @ 185 ft each stopped due to end of study, nl pace, no significant desat or sob. > hs 02 only  - ono on 2lpm/cpap  07/20/2017   desats < 89% x 2.4 min   - 06/07/2021   Walked on RA  x  2  lap(s) =  approx 500  ft  @relatively  fast  Pace with uplift walker, stopped due to desats to 85%  with lowest 02 sats    Again advised: Make sure you check your oxygen  saturation  AT  your highest level of activity (not after you stop)   to be sure it stays over 90% and adjust  02 flow upward to maintain this level if needed but remember to turn it back to previous settings when you stop (to conserve your supply).

## 2023-09-19 NOTE — Assessment & Plan Note (Signed)
 Body mass index is 42.6 kg/m.  -  trending down/ re- enforced Lab Results  Component Value Date   TSH 1.550 08/26/2022    F/u q 12 m, sooner prn  Contributing to doe and risk of GERD >>>   reviewed the need and the process to achieve and maintain neg calorie balance > defer f/u primary care including intermittently monitoring thyroid  status           Each maintenance medication was reviewed in detail including emphasizing most importantly the difference between maintenance and prns and under what circumstances the prns are to be triggered using an action plan format where appropriate.  Total time for H and P, chart review, counseling, reviewing dpi/ hfa/ 02 / pulse ox device(s) and generating customized AVS unique to this office visit / same day charting = 35 min

## 2023-09-19 NOTE — Patient Instructions (Addendum)
 Make sure you check your oxygen  saturation  AT  your highest level of activity (not after you stop)   to be sure it stays over 90% and adjust  02 flow upward to maintain this level if needed but remember to turn it back to previous settings when you stop (to conserve your supply).   Arm and hammer toothpaste good choice after use of Trelegy    Please schedule a follow up visit in 12  months but call sooner if needed

## 2023-09-19 NOTE — Assessment & Plan Note (Addendum)
 Quit smoking around 2007  - 11/27/2013  PFTs   FEV1  1.51 (54%) ratio 52 and and no better p B2 and DLCO  71 - 01/08/2014  p extensive coaching HFA effectiveness =    90% > rec resume symbicort  160 2bid  - 03/27/14 trial of dulera 200/tudorza samples only to assure adherence  04/10/2014   Alpha 1 MM , nml level  - trial off spiriva  05/22/14 / off all resp rx 08/22/14 - PFT's  10/09/2014  FEV1 1.64 (59 % ) ratio 54  p 11 % improvement from saba with DLCO  69 % corrects to 73  % for alv volume   - 07/14/2015  A  > try spiriva  2 puffs each am > no better so pt d/c'd  - PFT's  07/18/2017  FEV1 1.10 (41 % ) ratio 46   p 19 % improvement from saba p nothing prior to study with DLCO  70 % corrects to 75  % for alv volume  On coreg   - 07/18/2017    try stiolto  - Spirometry 12/04/2017  FEV1 1.3 (49%)  Ratio 49 p am stiolto x 2 puffs  w classic curvature - PFT's  07/26/23  FEV1 1.16 (44 % ) ratio 0.40  p 8 % improvement from saba p Stiolto prior to study with DLCO  13.41 (64%)   and FV curve classically concave and ERV 29% at wt 259   > started on Trelegy 100 by pcp    She likes trelegy 100 and thinks it's helping her breathing by beginning to bother her throat which is common in pts on ACEi or entresto   The sacubitrilat component of entresto  is not and ACEi but it does lead to higher levels of bradykinin (the culprit in ACEi related cough) because it reduces Neprilysin based clearance of bradykinin. The typical symptoms are dry daytime cough (9% per PI) or complaints of a new sensation of globus or excess PNDS.   Rec: Try arm and hammer mouthwash p trelegy use and if can't tolerate it could try breztri or just go back to stiolto with approp saba:  Re SABA :  I spent extra time with pt today reviewing appropriate use of albuterol  for prn use on exertion with the following points: 1) saba is for relief of sob that does not improve by walking a slower pace or resting but rather if the pt does not improve after trying  this first. 2) If the pt is convinced, as many are, that saba helps recover from activity faster then it's easy to tell if this is the case by re-challenging : ie stop, take the inhaler, then p 5 minutes try the exact same activity (intensity of workload) that just caused the symptoms and see if they are substantially diminished or not after saba 3) if there is an activity that reproducibly causes the symptoms, try the saba 15 min before the activity on alternate days   If in fact the saba really does help, then fine to continue to use it prn but advised may need to look closer at the maintenance regimen being used (whether trelegy or stiolto) to achieve better control of airways disease with exertion.

## 2023-09-20 ENCOUNTER — Encounter: Payer: Self-pay | Admitting: Family Medicine

## 2023-09-20 ENCOUNTER — Ambulatory Visit (INDEPENDENT_AMBULATORY_CARE_PROVIDER_SITE_OTHER): Admitting: Family Medicine

## 2023-09-20 VITALS — BP 92/66 | HR 84 | Temp 97.3°F | Resp 18 | Ht 65.0 in | Wt 256.0 lb

## 2023-09-20 DIAGNOSIS — R1084 Generalized abdominal pain: Secondary | ICD-10-CM

## 2023-09-20 DIAGNOSIS — A09 Infectious gastroenteritis and colitis, unspecified: Secondary | ICD-10-CM

## 2023-09-20 DIAGNOSIS — E861 Hypovolemia: Secondary | ICD-10-CM

## 2023-09-20 DIAGNOSIS — R197 Diarrhea, unspecified: Secondary | ICD-10-CM

## 2023-09-20 NOTE — Progress Notes (Unsigned)
 Acute Office Visit  Subjective:     Patient ID: Judy [name], female    DOB: 13-Mar-1960, 63 y.o.   MRN: [id number]  Chief Complaint  Patient presents with   Diarrhea   Discussed the use of AI scribe software for clinical note transcription with the patient, who gave verbal consent to proceed.  History of Present Illness   Judy [name] is a 63 year old female who presents with diarrhea and abdominal discomfort.  Diarrhea - Onset for most days, occurring for a couple of hours in the morning and a couple of hours at night - One episode lasted all day yesterday - Stool described as 'like water' - Stool is dark green in color - No blood in stool - No recent travel outside the country - No recent antibiotic use; last course of amoxicillin  was in June  Abdominal discomfort - Described as feeling 'sunburned' and 'raw' inside the stomach - Pain located 'way down low' in the abdomen - No associated nausea  Dizziness and lightheadedness - Felt dizzy and lightheaded for the first time when walking into the clinic today  Medication use - Currently taking Trelegy for the past few weeks - Jardiance not taken for a long time - Entresto  taken this morning and scheduled for another dose tonight      Review of Systems  Constitutional:  Negative for chills and fever.  HENT:  Negative for congestion.   Respiratory:  Negative for cough and shortness of breath.   Cardiovascular:  Negative for chest pain.  Gastrointestinal:  Positive for abdominal pain and diarrhea. Negative for nausea and vomiting.        Objective:    BP 92/66 (Patient Position: Standing)   Pulse 84   Temp (!) 97.3 F (36.3 C)   Resp 18   Ht 5' 5 (1.651 m)   Wt 256 lb (116.1 kg)   SpO2 94%   BMI 42.60 kg/m    Physical Exam Vitals reviewed.  Constitutional:      Appearance: Normal appearance. She is obese.  Cardiovascular:     Rate and Rhythm: Normal rate and regular rhythm.     Heart sounds:  Normal heart sounds.  Pulmonary:     Effort: Pulmonary effort is normal. No respiratory distress.     Breath sounds: Normal breath sounds.  Abdominal:     General: Abdomen is flat. Bowel sounds are normal.     Palpations: Abdomen is soft.     Tenderness: There is abdominal tenderness (mild generalized.).  Neurological:     Mental Status: She is alert and oriented to person, place, and time.  Psychiatric:        Mood and Affect: Mood normal.        Behavior: Behavior normal.     Results for orders placed or performed in visit on 09/20/23  CBC with Differential/Platelet  Result Value Ref Range   WBC 7.6 3.4 - 10.8 x10E3/uL   RBC 4.55 3.77 - 5.28 x10E6/uL   Hemoglobin 14.0 11.1 - 15.9 g/dL   Hematocrit 57.4 65.9 - 46.6 %   MCV 93 79 - 97 fL   MCH 30.8 26.6 - 33.0 pg   MCHC 32.9 31.5 - 35.7 g/dL   RDW 86.8 88.2 - 84.5 %   Platelets 214 150 - 450 x10E3/uL   Neutrophils 70 Not Estab. %   Lymphs 17 Not Estab. %   Monocytes 6 Not Estab. %   Eos 7 Not Estab. %  Basos 0 Not Estab. %   Neutrophils Absolute 5.3 1.4 - 7.0 x10E3/uL   Lymphocytes Absolute 1.3 0.7 - 3.1 x10E3/uL   Monocytes Absolute 0.5 0.1 - 0.9 x10E3/uL   EOS (ABSOLUTE) 0.5 (H) 0.0 - 0.4 x10E3/uL   Basophils Absolute 0.0 0.0 - 0.2 x10E3/uL   Immature Granulocytes 0 Not Estab. %   Immature Grans (Abs) 0.0 0.0 - 0.1 x10E3/uL  Comprehensive metabolic panel with GFR  Result Value Ref Range   Glucose 116 (H) 70 - 99 mg/dL   BUN 24 8 - 27 mg/dL   Creatinine, Ser 9.29 0.57 - 1.00 mg/dL   eGFR 97 >40 fO/fpw/8.26   BUN/Creatinine Ratio 34 (H) 12 - 28   Sodium 146 (H) 134 - 144 mmol/L   Potassium 3.9 3.5 - 5.2 mmol/L   Chloride 109 (H) 96 - 106 mmol/L   CO2 21 20 - 29 mmol/L   Calcium  9.3 8.7 - 10.3 mg/dL   Total Protein 5.5 (L) 6.0 - 8.5 g/dL   Albumin  3.8 (L) 3.9 - 4.9 g/dL   Globulin, Total 1.7 1.5 - 4.5 g/dL   Bilirubin Total 0.8 0.0 - 1.2 mg/dL   Alkaline Phosphatase 173 (H) 44 - 121 IU/L   AST 17 0 - 40 IU/L    ALT 18 0 - 32 IU/L        Assessment & Plan:   Problem List Items Addressed This Visit       Active Problems   Diarrhea of presumed infectious origin - Primary    ACUTE WATERY DIARRHEA: You have been experiencing watery diarrhea without recent travel or antibiotic use, which may be due to a bacterial infection. -We will order stool studies to check for a bacterial infection. Bring back as soon as you can please.  -Make sure to stay hydrated by drinking plenty of fluids. -May use immodium sparingly 1-2 daily.       Relevant Orders   CBC with Differential/Platelet (Completed)   Comprehensive metabolic panel with GFR (Completed)   GI Profile, Stool, PCR   Generalized abdominal pain   ABDOMINAL DISCOMFORT: Your abdominal discomfort is likely related to the diarrhea. -Addressing the diarrhea should help alleviate the abdominal discomfort.      Hypotension due to hypovolemia   HYPOTENSION: Your dizziness and lightheadedness may be due to low blood pressure, possibly from dehydration or your medication, Entresto . -Hold off on taking Entresto  tonight. -Check your blood pressure in the morning. If it is less than 110 mmHg, skip your dose of Entresto  for the next couple of days.        No orders of the defined types were placed in this encounter.   No follow-ups on file.   Abigail Free, MD

## 2023-09-20 NOTE — Patient Instructions (Signed)
 VISIT SUMMARY:  Today, you were seen for diarrhea, abdominal discomfort, and dizziness. We discussed possible causes and made a plan to address each issue.  YOUR PLAN:  ACUTE WATERY DIARRHEA: You have been experiencing watery diarrhea without recent travel or antibiotic use, which may be due to a bacterial infection. -We will order stool studies to check for a bacterial infection. Bring back as soon as you can please.  -Make sure to stay hydrated by drinking plenty of fluids. -May use immodium sparingly 1-2 daily.   ABDOMINAL DISCOMFORT: Your abdominal discomfort is likely related to the diarrhea. -Addressing the diarrhea should help alleviate the abdominal discomfort.  HYPOTENSION: Your dizziness and lightheadedness may be due to low blood pressure, possibly from dehydration or your medication, Entresto . -Hold off on taking Entresto  tonight. -Check your blood pressure in the morning. If it is less than 110 mmHg, skip your dose of Entresto  for the next couple of days.

## 2023-09-21 ENCOUNTER — Ambulatory Visit: Payer: Self-pay | Admitting: Family Medicine

## 2023-09-21 DIAGNOSIS — R197 Diarrhea, unspecified: Secondary | ICD-10-CM | POA: Diagnosis not present

## 2023-09-21 DIAGNOSIS — R1084 Generalized abdominal pain: Secondary | ICD-10-CM | POA: Insufficient documentation

## 2023-09-21 DIAGNOSIS — E861 Hypovolemia: Secondary | ICD-10-CM | POA: Insufficient documentation

## 2023-09-21 LAB — COMPREHENSIVE METABOLIC PANEL WITH GFR
ALT: 18 IU/L (ref 0–32)
AST: 17 IU/L (ref 0–40)
Albumin: 3.8 g/dL — ABNORMAL LOW (ref 3.9–4.9)
Alkaline Phosphatase: 173 IU/L — ABNORMAL HIGH (ref 44–121)
BUN/Creatinine Ratio: 34 — ABNORMAL HIGH (ref 12–28)
BUN: 24 mg/dL (ref 8–27)
Bilirubin Total: 0.8 mg/dL (ref 0.0–1.2)
CO2: 21 mmol/L (ref 20–29)
Calcium: 9.3 mg/dL (ref 8.7–10.3)
Chloride: 109 mmol/L — ABNORMAL HIGH (ref 96–106)
Creatinine, Ser: 0.7 mg/dL (ref 0.57–1.00)
Globulin, Total: 1.7 g/dL (ref 1.5–4.5)
Glucose: 116 mg/dL — ABNORMAL HIGH (ref 70–99)
Potassium: 3.9 mmol/L (ref 3.5–5.2)
Sodium: 146 mmol/L — ABNORMAL HIGH (ref 134–144)
Total Protein: 5.5 g/dL — ABNORMAL LOW (ref 6.0–8.5)
eGFR: 97 mL/min/1.73 (ref >59)

## 2023-09-21 LAB — CBC WITH DIFFERENTIAL/PLATELET
Basophils Absolute: 0 x10E3/uL (ref 0.0–0.2)
Basos: 0 %
EOS (ABSOLUTE): 0.5 x10E3/uL — ABNORMAL HIGH (ref 0.0–0.4)
Eos: 7 %
Hematocrit: 42.5 % (ref 34.0–46.6)
Hemoglobin: 14 g/dL (ref 11.1–15.9)
Immature Grans (Abs): 0 x10E3/uL (ref 0.0–0.1)
Immature Granulocytes: 0 %
Lymphocytes Absolute: 1.3 x10E3/uL (ref 0.7–3.1)
Lymphs: 17 %
MCH: 30.8 pg (ref 26.6–33.0)
MCHC: 32.9 g/dL (ref 31.5–35.7)
MCV: 93 fL (ref 79–97)
Monocytes Absolute: 0.5 x10E3/uL (ref 0.1–0.9)
Monocytes: 6 %
Neutrophils Absolute: 5.3 x10E3/uL (ref 1.4–7.0)
Neutrophils: 70 %
Platelets: 214 x10E3/uL (ref 150–450)
RBC: 4.55 x10E6/uL (ref 3.77–5.28)
RDW: 13.1 % (ref 11.7–15.4)
WBC: 7.6 x10E3/uL (ref 3.4–10.8)

## 2023-09-21 NOTE — Assessment & Plan Note (Signed)
 HYPOTENSION: Your dizziness and lightheadedness may be due to low blood pressure, possibly from dehydration or your medication, Entresto . -Hold off on taking Entresto  tonight. -Check your blood pressure in the morning. If it is less than 110 mmHg, skip your dose of Entresto  for the next couple of days.

## 2023-09-21 NOTE — Assessment & Plan Note (Signed)
 ABDOMINAL DISCOMFORT: Your abdominal discomfort is likely related to the diarrhea. -Addressing the diarrhea should help alleviate the abdominal discomfort.

## 2023-09-21 NOTE — Assessment & Plan Note (Signed)
  ACUTE WATERY DIARRHEA: You have been experiencing watery diarrhea without recent travel or antibiotic use, which may be due to a bacterial infection. -We will order stool studies to check for a bacterial infection. Bring back as soon as you can please.  -Make sure to stay hydrated by drinking plenty of fluids. -May use immodium sparingly 1-2 daily.

## 2023-09-22 ENCOUNTER — Encounter: Payer: Self-pay | Admitting: Family Medicine

## 2023-09-23 ENCOUNTER — Other Ambulatory Visit: Payer: Self-pay

## 2023-09-23 DIAGNOSIS — E782 Mixed hyperlipidemia: Secondary | ICD-10-CM

## 2023-09-23 DIAGNOSIS — E1169 Type 2 diabetes mellitus with other specified complication: Secondary | ICD-10-CM

## 2023-09-23 LAB — GI PROFILE, STOOL, PCR

## 2023-09-24 ENCOUNTER — Encounter: Payer: Self-pay | Admitting: Family Medicine

## 2023-09-25 ENCOUNTER — Other Ambulatory Visit

## 2023-09-28 ENCOUNTER — Other Ambulatory Visit (HOSPITAL_BASED_OUTPATIENT_CLINIC_OR_DEPARTMENT_OTHER): Payer: Self-pay

## 2023-09-28 ENCOUNTER — Encounter: Payer: Self-pay | Admitting: Family Medicine

## 2023-09-28 ENCOUNTER — Ambulatory Visit: Admitting: Family Medicine

## 2023-09-28 VITALS — BP 110/74 | HR 69 | Temp 97.2°F | Resp 20 | Ht 65.0 in | Wt 262.0 lb

## 2023-09-28 DIAGNOSIS — I110 Hypertensive heart disease with heart failure: Secondary | ICD-10-CM | POA: Diagnosis not present

## 2023-09-28 DIAGNOSIS — M5412 Radiculopathy, cervical region: Secondary | ICD-10-CM

## 2023-09-28 DIAGNOSIS — K219 Gastro-esophageal reflux disease without esophagitis: Secondary | ICD-10-CM | POA: Diagnosis not present

## 2023-09-28 DIAGNOSIS — J9611 Chronic respiratory failure with hypoxia: Secondary | ICD-10-CM

## 2023-09-28 DIAGNOSIS — G959 Disease of spinal cord, unspecified: Secondary | ICD-10-CM | POA: Diagnosis not present

## 2023-09-28 DIAGNOSIS — I5022 Chronic systolic (congestive) heart failure: Secondary | ICD-10-CM

## 2023-09-28 DIAGNOSIS — E1169 Type 2 diabetes mellitus with other specified complication: Secondary | ICD-10-CM

## 2023-09-28 DIAGNOSIS — E785 Hyperlipidemia, unspecified: Secondary | ICD-10-CM

## 2023-09-28 DIAGNOSIS — J449 Chronic obstructive pulmonary disease, unspecified: Secondary | ICD-10-CM

## 2023-09-28 LAB — POCT GLYCOSYLATED HEMOGLOBIN (HGB A1C): HbA1c POC (<> result, manual entry): 5.7 % (ref 4.0–5.6)

## 2023-09-28 LAB — POCT LIPID PANEL
HDL: 31
LDL/HDL Ratio: 1.6
LDL: 50
Non-HDL: 75
TC: 106
TRG: 122

## 2023-09-28 MED ORDER — DULOXETINE HCL 30 MG PO CPEP
30.0000 mg | ORAL_CAPSULE | Freq: Every day | ORAL | 0 refills | Status: DC
Start: 2023-09-28 — End: 2023-11-16
  Filled 2023-09-28: qty 90, 90d supply, fill #0

## 2023-09-28 NOTE — Progress Notes (Signed)
 Subjective:  Patient ID: Judy [name], female    DOB: December 20, 1960  Age: 63 y.o. MRN: [id number]  Chief Complaint  Patient presents with   Medical Management of Chronic Issues    HPI: Diabetes:  Complications: hyperlipidemia Glucose checking: not checking Most recent A1C: 5.7 Current medications: Farxiga  10 mg once daily, Trulicity  3 mg weekly. Last Eye Exam: 06/08/23 Foot checks: yes Lifestyle changes: Eating fairly healthy. Unable to exercise due to back pain.    Hyperlipidemia: Current medications: lipitor 20 mg daily, vascepa  2 oral twice daily, coenzyme Q 10 daily Labs reviewed.    Hypertension with chf due to idiopathic cardiomyopathy. Current medications: Coreg  6.25 mg twice daily, Entresto  24-26 mg twice daily, ASA 81 mg daily   Chronic pain syndrome: lumbar back pain secondary to severe scoliosis and harrington rod complication. Neck pain with very abnormal MRI. Scheduled to see Dr. Darlis in 10/2023. Patient wanted to meet for an appointment prior to an injection due to the last one being very painful.  - Chronic pain in the neck and back with severity rated 5-6 out of 10 - Currently taking Lyrica  for pain management - Tramadol  available but avoided when possible - Previously used Percocet, but none available currently   OSA/COPD: uses cpap. Compliant and benefits. On trelegy on inhalation daily and airsupra  2 puffs four times a day as needed. Sees pulmonology. Has improved since new inhalers started.    Chronic Respiratory Failure with hypoxia. Wears oxygen  2 L at night and with exertion as needed daily.    Hypothyroidism -currently on synthroid  75 mcg. TSH 1.55   Depression, mild, recurrent- wellbutrin  XL 300 mg once daily in a.m. clonazepam  0.5 mg 1 twice daily as needed for panic attacks. Never takes.  Lower extremity edema and neuropathic symptoms - Swelling in both legs, associated with discontinuation of Entresto  due to AGE and resulting hypotension last  week. AGE resolved. Stool studies were negative.  - Restarted Entresto  last night and this morning - Uses compression cups to manage swelling - Calf pain present for one month, worsens when sitting in a recliner chair - Burning sensation in the feet - Numbness of the left great toe  Pruritic cutaneous lesions - Itchy, lumpy lesions on the left upper arm developed one week ago - Lesions described as hard, bruise-like, and similar to bug bites - Itching worse at night - Using calamine lotion and Benadryl for symptomatic relief  Cardiovascular management - Blood pressure well controlled - Currently taking carvedilol  and Entresto  for cardiac management      07/17/2023    9:18 AM 03/23/2023   10:51 AM 12/08/2022    3:22 PM 08/30/2022    3:41 PM 04/15/2022   10:10 AM  Depression screen PHQ 2/9  Decreased Interest 0 0 0 1 0  Down, Depressed, Hopeless 0 0 0 1 0  PHQ - 2 Score 0 0 0 2 0  Altered sleeping 0 1 2 2 3   Tired, decreased energy 1 1 1 2  0  Change in appetite 0 1 0 1 0  Feeling bad or failure about yourself  0 1 0 0 0  Trouble concentrating 0 1 1  0  Moving slowly or fidgety/restless 0 1 0 0 0  Suicidal thoughts 0 0  0 0  PHQ-9 Score 1 6 4 7 3   Difficult doing work/chores Not difficult at all Not difficult at all Somewhat difficult  Not difficult at all        03/23/2023  10:51 AM  Fall Risk   Falls in the past year? 0  Number falls in past yr: 0  Injury with Fall? 0  Risk for fall due to : No Fall Risks  Follow up Falls evaluation completed    Patient Care Team: Sherre Clapper, MD as PCP - General (Family Medicine) Nyle Rankin POUR, [facility] (Inactive) (Pharmacist) Maryl Lynwood Raddle., OD (Optometry) Shlomo Wilbert SAUNDERS, MD as Consulting Physician (Cardiology) Larnell Purchase, MD as Referring Physician (Orthopedic Surgery) Darlean Ozell NOVAK, MD as Consulting Physician (Pulmonary Disease) Pecen, Paula E, MD as Consulting Physician (Ophthalmology)   Review of Systems   Constitutional:  Negative for chills, fatigue and fever.  HENT:  Negative for congestion, ear pain and sore throat.   Respiratory:  Negative for cough and shortness of breath.   Cardiovascular:  Negative for chest pain.  Gastrointestinal:  Negative for abdominal pain, constipation, diarrhea, nausea and vomiting.  Genitourinary:  Negative for dysuria and urgency.  Musculoskeletal:  Negative for arthralgias and myalgias.  Skin:  Negative for rash.  Neurological:  Negative for dizziness and headaches.  Psychiatric/Behavioral:  Negative for dysphoric mood. The patient is not nervous/anxious.     Current Outpatient Medications on File Prior to Visit  Medication Sig Dispense Refill   Albuterol -Budesonide  (AIRSUPRA ) 90-80 MCG/ACT AERO Inhale 2 puffs into the lungs 4 (four) times daily as needed (dyspnea/wheezing). 32.1 g 3   aspirin  81 MG tablet Take 81 mg by mouth at bedtime.      atorvastatin  (LIPITOR) 20 MG tablet TAKE 1 TABLET EVERY DAY 90 tablet 3   buPROPion  (WELLBUTRIN  XL) 300 MG 24 hr tablet TAKE 1 TABLET EVERY DAY 90 tablet 3   carvedilol  (COREG ) 6.25 MG tablet TAKE 1 TABLET TWICE DAILY 180 tablet 2   clonazePAM  (KLONOPIN ) 0.5 MG tablet Take 1 tablet (0.5 mg total) by mouth 2 (two) times daily as needed for anxiety (panic attacks). 90 tablet 0   Coenzyme Q10 (CO Q 10) 100 MG CAPS Take 200 mg by mouth daily.     diphenhydrAMINE (BENADRYL) 25 MG tablet Take 50 mg by mouth at bedtime as needed.     Dulaglutide  (TRULICITY ) 3 MG/0.5ML SOPN Inject 3 mg as directed once a week. 2 mL 0   empagliflozin (JARDIANCE) 10 MG TABS tablet Take by mouth daily.     fluticasone  (FLONASE ) 50 MCG/ACT nasal spray Place 2 sprays into both nostrils daily. 48 mL 2   Fluticasone -Umeclidin-Vilant (TRELEGY ELLIPTA ) 100-62.5-25 MCG/ACT AEPB Inhale 1 puff into the lungs daily. 28 each 5   levothyroxine  (SYNTHROID ) 75 MCG tablet TAKE 1 TABLET EVERY DAY 90 tablet 3   lidocaine  (LIDODERM ) 5 % Place 3 patches onto the  skin daily. Remove & Discard patch within 12 hours or as directed by MD 90 patch 3   meloxicam  (MOBIC ) 15 MG tablet TAKE 1 TABLET EVERY DAY 90 tablet 3   Multiple Vitamin (MULTIVITAMIN WITH MINERALS) TABS tablet Take 1 tablet by mouth daily.     omeprazole  (PRILOSEC) 20 MG capsule Take 1 capsule (20 mg total) by mouth daily. 90 capsule 3   OXYGEN  2lpm 2 lpm as needed     pregabalin  (LYRICA ) 300 MG capsule TAKE 1 CAPSULE TWICE DAILY 180 capsule 1   sacubitril -valsartan  (ENTRESTO ) 24-26 MG Take 1 tablet by mouth 2 (two) times daily. 180 tablet 3   tiZANidine  (ZANAFLEX ) 4 MG tablet TAKE 1 TABLET EVERY 6 HOURS AS NEEDED FOR MUSCLE SPASM(S) 360 tablet 3   UNABLE TO FIND CPAP  with o2 2lpm  DME- [facility]     VASCEPA  1 g capsule TAKE 2 CAPSULES BY MOUTH TWICE A DAY 360 capsule 1   Vitamin D , Ergocalciferol , (DRISDOL ) 1.25 MG (50000 UNIT) CAPS capsule TAKE 1 CAPSULE EVERY 7 DAYS 12 capsule 3   No current facility-administered medications on file prior to visit.   Past Medical History:  Diagnosis Date   Anxiety    Back pain    Bilateral swelling of feet    CAD (coronary artery disease), native coronary artery    coronary CTA 01/2022 < 25% plaque in the mid RCA, LAD and OM1 with coronary Ca score of 1.23.   Chronic combined systolic and diastolic CHF (congestive heart failure) (HCC) 10/07/2014   COPD (chronic obstructive pulmonary disease) (HCC)    DCM (dilated cardiomyopathy) (HCC) 04/25/2014   EF 28% by MRI despite maximum medical therapy   Depression    Diabetes mellitus without complication (HCC)    Epilepsy (HCC)    as a child   Fatty liver    Former tobacco use    Hyperlipidemia    Hypertension    Hypothyroidism    Joint pain    Leg swelling    Morbid obesity (HCC)    NICM (nonischemic cardiomyopathy) (HCC)    OSA (obstructive sleep apnea)    moderate with AHI 26/hr with oxygen  desaturations as low as 70%   Scoliosis    Sleep apnea    SOB (shortness of breath)    Past Surgical  History:  Procedure Laterality Date   ABDOMINAL HYSTERECTOMY     ankle artery involved cyst removal     CARDIAC CATHETERIZATION  05/14/2014   normal coronary arteries   CARPAL TUNNEL RELEASE     FASCIOTOMY     1993 for platar fasciitis   harrington rod scoliosis     1981   LEFT HEART CATHETERIZATION WITH CORONARY ANGIOGRAM N/A 05/14/2014   Procedure: LEFT HEART CATHETERIZATION WITH CORONARY ANGIOGRAM;  Surgeon: Deatrice DELENA Cage, MD;  Location: [facility] CATH [facility];  Service: Cardiovascular;  Laterality: N/A;   ROTATOR CUFF REPAIR Right 06/2021   UMBILICAL HERNIA REPAIR     VESICOVAGINAL FISTULA CLOSURE W/ TAH      Family History  Problem Relation Age of Onset   Diabetes Mother    Hyperlipidemia Mother    Obesity Mother    Emphysema Father    Allergies Father    Heart failure Father    Heart disease Father 28       MI   Diabetes Father    Hyperlipidemia Father    Hypertension Father    Thyroid  disease Father    Alcohol abuse Father    Obesity Father    Breast cancer Neg Hx    Social History   Socioeconomic History   Marital status: Married    Spouse name: Not on file   Number of children: Not on file   Years of education: Not on file   Highest education level: Some college, no degree  Occupational History   Occupation: disabled  Tobacco Use   Smoking status: Former    Current packs/day: 0.00    Average packs/day: 1 pack/day for 20.0 years (20.0 ttl pk-yrs)    Types: Cigarettes    Start date: 02/14/1985    Quit date: 02/14/2005    Years since quitting: 18.6   Smokeless tobacco: Never  Vaping Use   Vaping status: Never Used  Substance and Sexual Activity   Alcohol use: Yes  Alcohol/week: 2.0 standard drinks of alcohol    Types: 2 Shots of liquor per week    Comment: socially   Drug use: No   Sexual activity: Not Currently  Other Topics Concern   Not on file  Social History Narrative   Lives in [place] with spouse and son.   Previously worked as a Economist         Social Drivers of Corporate investment banker Strain: Low Risk  (08/26/2022)   Overall Financial Resource Strain (CARDIA)    Difficulty of Paying Living Expenses: Not very hard  Recent Concern: Financial Resource Strain - High Risk (06/23/2022)   Overall Financial Resource Strain (CARDIA)    Difficulty of Paying Living Expenses: Very hard  Food Insecurity: No Food Insecurity (07/17/2023)   Hunger Vital Sign    Worried About Running Out of Food in the Last Year: Never true    Ran Out of Food in the Last Year: Never true  Transportation Needs: No Transportation Needs (07/17/2023)   PRAPARE - Administrator, Civil Service (Medical): No    Lack of Transportation (Non-Medical): No  Physical Activity: Insufficiently Active (08/26/2022)   Exercise Vital Sign    Days of Exercise per Week: 2 days    Minutes of Exercise per Session: 20 min  Stress: No Stress Concern Present (08/26/2022)   [employer] of Occupational Health - Occupational Stress Questionnaire    Feeling of Stress : Only a little  Social Connections: Socially Isolated (08/26/2022)   Social Connection and Isolation Panel    Frequency of Communication with Friends and Family: Once a week    Frequency of Social Gatherings with Friends and Family: Once a week    Attends Religious Services: Never    Diplomatic Services operational officer: No    Attends Engineer, structural: Not on file    Marital Status: Married    Objective:  BP 110/74   Pulse 69   Temp (!) 97.2 F (36.2 C)   Resp 20   Ht 5' 5 (1.651 m)   Wt 262 lb (118.8 kg)   SpO2 95%   BMI 43.60 kg/m      09/28/2023   11:26 AM 09/20/2023   10:27 AM 09/20/2023   10:26 AM  BP/Weight  Systolic BP 110 92 98  Diastolic BP 74 66 58  Wt. (Lbs) 262    BMI 43.6 kg/m2      Physical Exam Vitals reviewed.  Constitutional:      Appearance: Normal appearance. She is obese.  Neck:     Vascular: No carotid bruit.   Cardiovascular:     Rate and Rhythm: Normal rate and regular rhythm.     Pulses: Normal pulses.     Heart sounds: Normal heart sounds.  Pulmonary:     Effort: Pulmonary effort is normal. No respiratory distress.     Breath sounds: Normal breath sounds.  Abdominal:     General: Abdomen is flat. Bowel sounds are normal.     Palpations: Abdomen is soft.     Tenderness: There is no abdominal tenderness.  Neurological:     Mental Status: She is alert and oriented to person, place, and time.  Psychiatric:        Mood and Affect: Mood normal.        Behavior: Behavior normal.      Diabetic foot exam was performed with the following findings:   No deformities, ulcerations,  or other skin breakdown Normal sensation of 10g monofilament Intact posterior tibialis and dorsalis pedis pulses      Lab Results  Component Value Date   WBC 7.6 09/20/2023   HGB 14.0 09/20/2023   HCT 42.5 09/20/2023   PLT 214 09/20/2023   GLUCOSE 116 (H) 09/20/2023   CHOL 124 06/20/2023   TRIG 183 (H) 06/20/2023   HDL 36 (L) 06/20/2023   LDLCALC 57 06/20/2023   ALT 18 09/20/2023   AST 17 09/20/2023   NA 146 (H) 09/20/2023   K 3.9 09/20/2023   CL 109 (H) 09/20/2023   CREATININE 0.70 09/20/2023   BUN 24 09/20/2023   CO2 21 09/20/2023   TSH 1.550 08/26/2022   INR 1.0 05/13/2014   HGBA1C 5.7 09/28/2023   MICROALBUR Negative 09/03/2019      Assessment & Plan:  COPD GOLD  III  Assessment & Plan: Improved.  Continue trelegy and airsupra .    Chronic respiratory failure with hypoxia (HCC) Assessment & Plan: Continue oxygen  2 L at night or if becomes dyspneic with exertion   Cervical myelopathy with cervical radiculopathy [facility]) Assessment & Plan: I will call Dr. Darlis to discuss her concerns about ESI and desiring GETA. She might be able to get by with conscious sedation.    Gastroesophageal reflux disease without esophagitis Assessment & Plan: The current medical regimen is effective;   continue present plan and medications.  Continue omeprazole .   Hyperlipidemia associated with type 2 diabetes mellitus (HCC) Assessment & Plan: Diabetes and cholesterol at goal Recommend check feet daily. Recommend annual eye exams. Medicines: Continue Farxiga  10 mg once daily, Trulicity  3 mg once daily, atorvastatin  20 mg before bed, vascepa  1 gm 2 capsules twice daily, and coenzyme q10.  Continue to work on eating a healthy diet and exercise.  Labs reviewed Neuropathy: continue lyrica  300 mg twice daily. Start on cymbalta  30 mg before bed.   Orders: -     POCT Lipid Panel -     POCT glycosylated hemoglobin (Hb A1C)  Hypertensive heart disease with chronic systolic congestive heart failure (HCC) Assessment & Plan: Well controlled.  No changes to medicines. Continue farxiga  10 mg daily, Coreg  6.25 mg twice daily, Entresto  24-26 mg twice daily, ASA 81 mg daily Continue to work on eating a healthy diet and exercise.  Labs reviewed.    Morbid obesity due to excess calories [facility]) Assessment & Plan: Recommend continue to work on eating healthy diet. Exercise as you can.    Other orders -     DULoxetine  HCl; Take 1 capsule (30 mg total) by mouth daily.  Dispense: 90 capsule; Refill: 0     Meds ordered this encounter  Medications   DULoxetine  (CYMBALTA ) 30 MG capsule    Sig: Take 1 capsule (30 mg total) by mouth daily.    Dispense:  90 capsule    Refill:  0    Orders Placed This Encounter  Procedures   POCT Lipid Panel   POCT glycosylated hemoglobin (Hb A1C)     Follow-up: Return in about 3 months (around 12/29/2023) for chronic follow up.   An After Visit Summary was printed and given to the patient.  Abigail Free, MD [facility] [phone]

## 2023-10-01 NOTE — Assessment & Plan Note (Signed)
The current medical regimen is effective;  continue present plan and medications. Continue omeprazole. ?

## 2023-10-01 NOTE — Assessment & Plan Note (Signed)
 I will call Dr. Darlis to discuss her concerns about ESI and desiring GETA. She might be able to get by with conscious sedation.

## 2023-10-01 NOTE — Assessment & Plan Note (Signed)
Well controlled.  No changes to medicines. Continue farxiga 10 mg daily, Coreg 6.25 mg twice daily, Entresto 24-26 mg twice daily, ASA 81 mg daily Continue to work on eating a healthy diet and exercise.  Labs reviewed.

## 2023-10-01 NOTE — Assessment & Plan Note (Signed)
 Continue oxygen  2 L at night or if becomes dyspneic with exertion

## 2023-10-01 NOTE — Assessment & Plan Note (Addendum)
 Diabetes and cholesterol at goal Recommend check feet daily. Recommend annual eye exams. Medicines: Continue Farxiga  10 mg once daily, Trulicity  3 mg once daily, atorvastatin  20 mg before bed, vascepa  1 gm 2 capsules twice daily, and coenzyme q10.  Continue to work on eating a healthy diet and exercise.  Labs reviewed Neuropathy: continue lyrica  300 mg twice daily. Start on cymbalta  30 mg before bed.

## 2023-10-01 NOTE — Assessment & Plan Note (Signed)
 Improved.  Continue trelegy and airsupra .

## 2023-10-01 NOTE — Assessment & Plan Note (Signed)
 Recommend continue to work on eating healthy diet. Exercise as you can.

## 2023-10-13 ENCOUNTER — Other Ambulatory Visit (HOSPITAL_BASED_OUTPATIENT_CLINIC_OR_DEPARTMENT_OTHER): Payer: Self-pay

## 2023-10-13 ENCOUNTER — Ambulatory Visit: Admitting: Physician Assistant

## 2023-10-13 VITALS — BP 102/70 | HR 82 | Temp 98.6°F | Ht 65.0 in | Wt 255.0 lb

## 2023-10-13 DIAGNOSIS — J40 Bronchitis, not specified as acute or chronic: Secondary | ICD-10-CM

## 2023-10-13 DIAGNOSIS — J029 Acute pharyngitis, unspecified: Secondary | ICD-10-CM | POA: Diagnosis not present

## 2023-10-13 LAB — POC COVID19 BINAXNOW: SARS Coronavirus 2 Ag: NEGATIVE

## 2023-10-13 LAB — POCT RAPID STREP A (OFFICE): Rapid Strep A Screen: NEGATIVE

## 2023-10-13 LAB — POCT FLU A/B STATUS
Influenza A, POC: NEGATIVE
Influenza B, POC: NEGATIVE

## 2023-10-13 MED ORDER — TRIAMCINOLONE ACETONIDE 40 MG/ML IJ SUSP
60.0000 mg | Freq: Once | INTRAMUSCULAR | Status: AC
  Administered 2023-10-13: 60 mg via INTRAMUSCULAR

## 2023-10-13 MED ORDER — PREDNISONE 20 MG PO TABS
20.0000 mg | ORAL_TABLET | Freq: Two times a day (BID) | ORAL | 0 refills | Status: DC
  Filled 2023-10-13: qty 10, 5d supply, fill #0

## 2023-10-13 MED ORDER — AMOXICILLIN-POT CLAVULANATE 875-125 MG PO TABS
1.0000 | ORAL_TABLET | Freq: Two times a day (BID) | ORAL | 0 refills | Status: DC
  Filled 2023-10-13: qty 20, 10d supply, fill #0

## 2023-10-17 ENCOUNTER — Encounter: Payer: Self-pay | Admitting: Physician Assistant

## 2023-10-17 NOTE — Progress Notes (Signed)
 Acute Office Visit  Subjective:    Patient ID: Judy [name], female    DOB: December 27, 1960, 63 y.o.   MRN: [id number]  Chief Complaint  Patient presents with   Cough   URI    HPI: Patient is in today for complaints of cough, cold and congestion that started yesterday - complains of ear pressure, watery eyes Has had malaise but no fever Sinus congestion and cough slightly productive   Current Outpatient Medications:    Albuterol -Budesonide  (AIRSUPRA ) 90-80 MCG/ACT AERO, Inhale 2 puffs into the lungs 4 (four) times daily as needed (dyspnea/wheezing)., Disp: 32.1 g, Rfl: 3   amoxicillin -clavulanate (AUGMENTIN ) 875-125 MG tablet, Take 1 tablet by mouth 2 (two) times daily., Disp: 20 tablet, Rfl: 0   aspirin  81 MG tablet, Take 81 mg by mouth at bedtime. , Disp: , Rfl:    atorvastatin  (LIPITOR) 20 MG tablet, TAKE 1 TABLET EVERY DAY, Disp: 90 tablet, Rfl: 3   buPROPion  (WELLBUTRIN  XL) 300 MG 24 hr tablet, TAKE 1 TABLET EVERY DAY, Disp: 90 tablet, Rfl: 3   carvedilol  (COREG ) 6.25 MG tablet, TAKE 1 TABLET TWICE DAILY, Disp: 180 tablet, Rfl: 2   clonazePAM  (KLONOPIN ) 0.5 MG tablet, Take 1 tablet (0.5 mg total) by mouth 2 (two) times daily as needed for anxiety (panic attacks)., Disp: 90 tablet, Rfl: 0   Coenzyme Q10 (CO Q 10) 100 MG CAPS, Take 200 mg by mouth daily., Disp: , Rfl:    diphenhydrAMINE (BENADRYL) 25 MG tablet, Take 50 mg by mouth at bedtime as needed., Disp: , Rfl:    Dulaglutide  (TRULICITY ) 3 MG/0.5ML SOPN, Inject 3 mg as directed once a week., Disp: 2 mL, Rfl: 0   DULoxetine  (CYMBALTA ) 30 MG capsule, Take 1 capsule (30 mg total) by mouth daily., Disp: 90 capsule, Rfl: 0   empagliflozin (JARDIANCE) 10 MG TABS tablet, Take by mouth daily., Disp: , Rfl:    fluticasone  (FLONASE ) 50 MCG/ACT nasal spray, Place 2 sprays into both nostrils daily., Disp: 48 mL, Rfl: 2   Fluticasone -Umeclidin-Vilant (TRELEGY ELLIPTA ) 100-62.5-25 MCG/ACT AEPB, Inhale 1 puff into the lungs daily., Disp: 28  each, Rfl: 5   levothyroxine  (SYNTHROID ) 75 MCG tablet, TAKE 1 TABLET EVERY DAY, Disp: 90 tablet, Rfl: 3   lidocaine  (LIDODERM ) 5 %, Place 3 patches onto the skin daily. Remove & Discard patch within 12 hours or as directed by MD, Disp: 90 patch, Rfl: 3   meloxicam  (MOBIC ) 15 MG tablet, TAKE 1 TABLET EVERY DAY, Disp: 90 tablet, Rfl: 3   Multiple Vitamin (MULTIVITAMIN WITH MINERALS) TABS tablet, Take 1 tablet by mouth daily., Disp: , Rfl:    omeprazole  (PRILOSEC) 20 MG capsule, Take 1 capsule (20 mg total) by mouth daily., Disp: 90 capsule, Rfl: 3   OXYGEN , 2lpm 2 lpm as needed, Disp: , Rfl:    predniSONE  (DELTASONE ) 20 MG tablet, Take 1 tablet (20 mg total) by mouth 2 (two) times daily with a meal., Disp: 10 tablet, Rfl: 0   pregabalin  (LYRICA ) 300 MG capsule, TAKE 1 CAPSULE TWICE DAILY, Disp: 180 capsule, Rfl: 1   sacubitril -valsartan  (ENTRESTO ) 24-26 MG, Take 1 tablet by mouth 2 (two) times daily., Disp: 180 tablet, Rfl: 3   tiZANidine  (ZANAFLEX ) 4 MG tablet, TAKE 1 TABLET EVERY 6 HOURS AS NEEDED FOR MUSCLE SPASM(S), Disp: 360 tablet, Rfl: 3   UNABLE TO FIND, CPAP with o2 2lpm  DME- [facility], Disp: , Rfl:    VASCEPA  1 g capsule, TAKE 2 CAPSULES BY MOUTH TWICE A  DAY, Disp: 360 capsule, Rfl: 1   Vitamin D , Ergocalciferol , (DRISDOL ) 1.25 MG (50000 UNIT) CAPS capsule, TAKE 1 CAPSULE EVERY 7 DAYS, Disp: 12 capsule, Rfl: 3  Allergies  Allergen Reactions   Meperidine Hcl Nausea And Vomiting and Nausea Only   Meperidine Nausea And Vomiting and Other (See Comments)    ROS CONSTITUTIONAL: Negative for chills, fatigue, fever, u E/N/T: see HPI CARDIOVASCULAR: Negative for chest pain, dizziness, RESPIRATORY: see HPI      Objective:    PHYSICAL EXAM:   BP 102/70   Pulse 82   Temp 98.6 F (37 C) (Temporal)   Ht 5' 5 (1.651 m)   Wt 255 lb (115.7 kg)   SpO2 94%   BMI 42.43 kg/m    GEN: Well nourished, well developed, in no acute distress  HEENT: right TM with air/fluid level -  left TM  normal Oropharynx - mild erythema Cardiac: RRR; no murmurs, rubs,  Respiratory:  faint scattered wheezes noted  Psych: euthymic mood, appropriate affect and demeanor Office Visit on 10/13/2023  Component Date Value Ref Range Status   SARS Coronavirus 2 Ag 10/13/2023 Negative  Negative Final   Influenza A, POC 10/13/2023 Negative  Negative Final   Influenza B, POC 10/13/2023 Negative  Negative Final   Rapid Strep A Screen 10/13/2023 Negative  Negative Final       Assessment & Plan:    Pharyngitis, unspecified etiology -     POCT rapid strep A  Bronchitis -     POC COVID-19 BinaxNow -     POCT Flu A & B Status -     Triamcinolone  Acetonide 60mg  given IM -     Amoxicillin -Pot Clavulanate; Take 1 tablet by mouth 2 (two) times daily.  Dispense: 20 tablet; Refill: 0 -     predniSONE ; Take 1 tablet (20 mg total) by mouth 2 (two) times daily with a meal.  Dispense: 10 tablet; Refill: 0 Continue current inhalers as directed    Follow-up: Return if symptoms worsen or fail to improve.  An After Visit Summary was printed and given to the patient.  Judy [name] [name] [phone]

## 2023-10-19 ENCOUNTER — Other Ambulatory Visit (HOSPITAL_BASED_OUTPATIENT_CLINIC_OR_DEPARTMENT_OTHER): Payer: Self-pay

## 2023-10-19 ENCOUNTER — Ambulatory Visit (INDEPENDENT_AMBULATORY_CARE_PROVIDER_SITE_OTHER): Admitting: Family Medicine

## 2023-10-19 ENCOUNTER — Ambulatory Visit: Payer: Self-pay

## 2023-10-19 VITALS — BP 112/64 | HR 98 | Temp 98.0°F | Ht 65.0 in | Wt 250.0 lb

## 2023-10-19 DIAGNOSIS — J209 Acute bronchitis, unspecified: Secondary | ICD-10-CM

## 2023-10-19 DIAGNOSIS — H65192 Other acute nonsuppurative otitis media, left ear: Secondary | ICD-10-CM

## 2023-10-19 DIAGNOSIS — J0100 Acute maxillary sinusitis, unspecified: Secondary | ICD-10-CM | POA: Diagnosis not present

## 2023-10-19 MED ORDER — CEFTRIAXONE SODIUM 1 G IJ SOLR
1.0000 g | Freq: Once | INTRAMUSCULAR | Status: AC
  Administered 2023-10-19: 1 g via INTRAMUSCULAR

## 2023-10-19 MED ORDER — DOXYCYCLINE HYCLATE 100 MG PO TABS
100.0000 mg | ORAL_TABLET | Freq: Two times a day (BID) | ORAL | 0 refills | Status: DC
  Filled 2023-10-19: qty 20, 10d supply, fill #0

## 2023-10-19 MED ORDER — BENZONATATE 200 MG PO CAPS
200.0000 mg | ORAL_CAPSULE | Freq: Three times a day (TID) | ORAL | 0 refills | Status: DC | PRN
  Filled 2023-10-19: qty 30, 10d supply, fill #0

## 2023-10-19 NOTE — Patient Instructions (Signed)
  VISIT SUMMARY: Today, you were seen for sleep disturbances, ear pain, dizziness, and nasal congestion. We identified a bilateral ear infection and an upper respiratory infection as the main causes of your symptoms.  YOUR PLAN: Left ACUTE OTITIS MEDIA: You have an ear infection in your left ear. -You received a Rocephin  shot (one gram) today. -You are prescribed doxycycline  and Augmentin  to treat the infection.  SINUSITIS: You have a SINUS infection. -You are prescribed Tessalon  Perles, 200 mg up to three times a day as needed for cough. -Continue taking Mucinex and Nyquil as needed.  INSOMNIA: You are experiencing difficulty sleeping, which is a recent issue. -Your sleep disturbances are likely related to your ear and respiratory infections. Treating these should help improve your sleep.  DIZZINESS: You are experiencing dizziness, which is likely related to your ear infection and upper respiratory infection. -Treating your ear and respiratory infections should help reduce your dizziness.                      Contains text generated by [employer].                                 Contains text generated by [employer]bridge.

## 2023-10-19 NOTE — Telephone Encounter (Signed)
 FYI Only or Action Required?: Action required by provider: request for appointment.  Patient was last seen in primary care on 10/13/2023 by Judy Credit, PA-C.  Called Nurse Triage reporting Ear Fullness.  Symptoms began several days ago.  Interventions attempted: OTC medications: mucinex, Nyquil, warm compresses and steam.  Symptoms are: gradually worsening.  Triage Disposition: See PCP When Office is Open (Within 3 Days)  Patient/caregiver understands and will follow disposition?: Yes          Message from [facility] G sent at 10/19/2023  9:09 AM EDT  Ears are clogged.. cold is worsening from last week.   Reason for Disposition  [1] Ear congestion lasts > 3 days AND [2] no improvement after using Care Advice  (Exception: Ear congestion is a chronic symptom.)  Answer Assessment - Initial Assessment Questions 1. LOCATION: Which ear is involved?       Both ears 2. SENSATION: Describe how the ear feels. (e.g., stuffy, full, plugged).      Fullness and clogged 3. ONSET:  When did the ear symptoms start?       Thursday, but got really bad yesterday 4. PAIN: Do you also have an earache? If Yes, ask: How bad is it? (Scale 0-10; none, mild, moderate or severe)     1/10; little pain, just can't hear 5. CAUSE: What do you think is causing the ear congestion? (e.g., common cold, nasal allergies, recent flight, recent snorkeling)     no 6. OTHER SYMPTOMS: Do you have any other symptoms? (e.g., ear drainage, hay fever symptoms such as sneezing or a clear nasal discharge; cold symptoms such as a cough or runny nose)     Runny nose, coughing productive; white foamy, denies fevers, but feels clammy, dizziness Pressure headache; most facial; because congestion, denies chest pain  Mucinex, nyquil, steam and warm compresses  Protocols used: Ear - Congestion-A-AH

## 2023-10-19 NOTE — Progress Notes (Signed)
 Subjective:  Patient ID: CHRYSTINA NAFF, female    DOB: 02/14/1961  Age: 63 y.o. MRN: [id number]  Chief Complaint  Patient presents with   Ear Pain    Right worse than left.    HPI: Discussed the use of AI scribe software for clinical note transcription with the patient, who gave verbal consent to proceed.  History of Present Illness ALEIGHYA MCANELLY is a 63 year old female who presents with sleep disturbances and ear pain.  Sleep disturbance - Difficulty sleeping over the past few nights - Waking at 3:00 AM two nights ago and at 2:30 AM the night before, unable to return to sleep - Last night, woke at 7:00 AM, which was an improvement - Describes sleep disturbance as a recent issue  Otalgia and hearing impairment - Bilateral ear pain, initially more pronounced in the left ear - Significant hearing difficulty, stating she 'can't hear squat' - Attempted symptom relief with hot compresses and steam applied to ears and face  Dizziness and balance disturbance - Dizziness characterized by a sensation of veering to the right or left, without spinning - Feels as though she might lose her balance - No true vertigo (no spinning sensation)  Nasal congestion and upper respiratory symptoms - Stuffy nose present - Frequent coughing ('should have abs of steel' from coughing) - No subjective fever, but feels clammy       07/17/2023    9:18 AM 03/23/2023   10:51 AM 12/08/2022    3:22 PM 08/30/2022    3:41 PM 04/15/2022   10:10 AM  Depression screen PHQ 2/9  Decreased Interest 0 0 0 1 0  Down, Depressed, Hopeless 0 0 0 1 0  PHQ - 2 Score 0 0 0 2 0  Altered sleeping 0 1 2 2 3   Tired, decreased energy 1 1 1 2  0  Change in appetite 0 1 0 1 0  Feeling bad or failure about yourself  0 1 0 0 0  Trouble concentrating 0 1 1  0  Moving slowly or fidgety/restless 0 1 0 0 0  Suicidal thoughts 0 0  0 0  PHQ-9 Score 1 6 4 7 3   Difficult doing work/chores Not difficult at all Not difficult at  all Somewhat difficult  Not difficult at all        03/23/2023   10:51 AM  Fall Risk   Falls in the past year? 0  Number falls in past yr: 0  Injury with Fall? 0  Risk for fall due to : No Fall Risks  Follow up Falls evaluation completed    Patient Care Team: Sherre Clapper, MD as PCP - General (Family Medicine) Nyle Rankin POUR, [facility] (Inactive) (Pharmacist) Maryl Lynwood Raddle., OD (Optometry) Shlomo Wilbert SAUNDERS, MD as Consulting Physician (Cardiology) Larnell Purchase, MD as Referring Physician (Orthopedic Surgery) Darlean Ozell NOVAK, MD as Consulting Physician (Pulmonary Disease) Pecen, Paula E, MD as Consulting Physician (Ophthalmology)   Review of Systems  Constitutional:  Positive for chills, diaphoresis and fever. Negative for fatigue.  HENT:  Positive for congestion and ear pain. Negative for sore throat.   Respiratory:  Positive for cough and shortness of breath. Negative for wheezing.   Cardiovascular:  Negative for chest pain.  Gastrointestinal:  Positive for abdominal pain.    Current Outpatient Medications on File Prior to Visit  Medication Sig Dispense Refill   Albuterol -Budesonide  (AIRSUPRA ) 90-80 MCG/ACT AERO Inhale 2 puffs into the lungs 4 (four) times daily as needed (  dyspnea/wheezing). 32.1 g 3   amoxicillin -clavulanate (AUGMENTIN ) 875-125 MG tablet Take 1 tablet by mouth 2 (two) times daily. 20 tablet 0   aspirin  81 MG tablet Take 81 mg by mouth at bedtime.      atorvastatin  (LIPITOR) 20 MG tablet TAKE 1 TABLET EVERY DAY 90 tablet 3   buPROPion  (WELLBUTRIN  XL) 300 MG 24 hr tablet TAKE 1 TABLET EVERY DAY 90 tablet 3   carvedilol  (COREG ) 6.25 MG tablet TAKE 1 TABLET TWICE DAILY 180 tablet 2   clonazePAM  (KLONOPIN ) 0.5 MG tablet Take 1 tablet (0.5 mg total) by mouth 2 (two) times daily as needed for anxiety (panic attacks). 90 tablet 0   Coenzyme Q10 (CO Q 10) 100 MG CAPS Take 200 mg by mouth daily.     diphenhydrAMINE (BENADRYL) 25 MG tablet Take 50 mg by mouth at bedtime  as needed.     Dulaglutide  (TRULICITY ) 3 MG/0.5ML SOPN Inject 3 mg as directed once a week. 2 mL 0   DULoxetine  (CYMBALTA ) 30 MG capsule Take 1 capsule (30 mg total) by mouth daily. 90 capsule 0   empagliflozin (JARDIANCE) 10 MG TABS tablet Take by mouth daily.     fluticasone  (FLONASE ) 50 MCG/ACT nasal spray Place 2 sprays into both nostrils daily. 48 mL 2   Fluticasone -Umeclidin-Vilant (TRELEGY ELLIPTA ) 100-62.5-25 MCG/ACT AEPB Inhale 1 puff into the lungs daily. 28 each 5   levothyroxine  (SYNTHROID ) 75 MCG tablet TAKE 1 TABLET EVERY DAY 90 tablet 3   lidocaine  (LIDODERM ) 5 % Place 3 patches onto the skin daily. Remove & Discard patch within 12 hours or as directed by MD 90 patch 3   meloxicam  (MOBIC ) 15 MG tablet TAKE 1 TABLET EVERY DAY 90 tablet 3   Multiple Vitamin (MULTIVITAMIN WITH MINERALS) TABS tablet Take 1 tablet by mouth daily.     omeprazole  (PRILOSEC) 20 MG capsule Take 1 capsule (20 mg total) by mouth daily. 90 capsule 3   OXYGEN  2lpm 2 lpm as needed     predniSONE  (DELTASONE ) 20 MG tablet Take 1 tablet (20 mg total) by mouth 2 (two) times daily with a meal. 10 tablet 0   pregabalin  (LYRICA ) 300 MG capsule TAKE 1 CAPSULE TWICE DAILY 180 capsule 1   sacubitril -valsartan  (ENTRESTO ) 24-26 MG Take 1 tablet by mouth 2 (two) times daily. 180 tablet 3   tiZANidine  (ZANAFLEX ) 4 MG tablet TAKE 1 TABLET EVERY 6 HOURS AS NEEDED FOR MUSCLE SPASM(S) 360 tablet 3   UNABLE TO FIND CPAP with o2 2lpm  DME- [facility]     VASCEPA  1 g capsule TAKE 2 CAPSULES BY MOUTH TWICE A DAY 360 capsule 1   Vitamin D , Ergocalciferol , (DRISDOL ) 1.25 MG (50000 UNIT) CAPS capsule TAKE 1 CAPSULE EVERY 7 DAYS 12 capsule 3   No current facility-administered medications on file prior to visit.   Past Medical History:  Diagnosis Date   Anxiety    Back pain    Bilateral swelling of feet    CAD (coronary artery disease), native coronary artery    coronary CTA 01/2022 < 25% plaque in the mid RCA, LAD and OM1 with coronary  Ca score of 1.23.   Chronic combined systolic and diastolic CHF (congestive heart failure) (HCC) 10/07/2014   COPD (chronic obstructive pulmonary disease) (HCC)    DCM (dilated cardiomyopathy) (HCC) 04/25/2014   EF 28% by MRI despite maximum medical therapy   Depression    Diabetes mellitus without complication (HCC)    Epilepsy (HCC)    as  a child   Fatty liver    Former tobacco use    Hyperlipidemia    Hypertension    Hypothyroidism    Joint pain    Leg swelling    Morbid obesity (HCC)    NICM (nonischemic cardiomyopathy) (HCC)    OSA (obstructive sleep apnea)    moderate with AHI 26/hr with oxygen  desaturations as low as 70%   Scoliosis    Sleep apnea    SOB (shortness of breath)    Past Surgical History:  Procedure Laterality Date   ABDOMINAL HYSTERECTOMY     ankle artery involved cyst removal     CARDIAC CATHETERIZATION  05/14/2014   normal coronary arteries   CARPAL TUNNEL RELEASE     FASCIOTOMY     1993 for platar fasciitis   harrington rod scoliosis     1981   LEFT HEART CATHETERIZATION WITH CORONARY ANGIOGRAM N/A 05/14/2014   Procedure: LEFT HEART CATHETERIZATION WITH CORONARY ANGIOGRAM;  Surgeon: Deatrice DELENA Cage, MD;  Location: [facility] CATH LAB;  Service: Cardiovascular;  Laterality: N/A;   ROTATOR CUFF REPAIR Right 06/2021   UMBILICAL HERNIA REPAIR     VESICOVAGINAL FISTULA CLOSURE W/ TAH      Family History  Problem Relation Age of Onset   Diabetes Mother    Hyperlipidemia Mother    Obesity Mother    Emphysema Father    Allergies Father    Heart failure Father    Heart disease Father 58       MI   Diabetes Father    Hyperlipidemia Father    Hypertension Father    Thyroid  disease Father    Alcohol abuse Father    Obesity Father    Breast cancer Neg Hx    Social History   Socioeconomic History   Marital status: Married    Spouse name: Not on file   Number of children: Not on file   Years of education: Not on file   Highest education level: Some  college, no degree  Occupational History   Occupation: disabled  Tobacco Use   Smoking status: Former    Current packs/day: 0.00    Average packs/day: 1 pack/day for 20.0 years (20.0 ttl pk-yrs)    Types: Cigarettes    Start date: 02/14/1985    Quit date: 02/14/2005    Years since quitting: 18.7   Smokeless tobacco: Never  Vaping Use   Vaping status: Never Used  Substance and Sexual Activity   Alcohol use: Yes    Alcohol/week: 2.0 standard drinks of alcohol    Types: 2 Shots of liquor per week    Comment: socially   Drug use: No   Sexual activity: Not Currently  Other Topics Concern   Not on file  Social History Narrative   Lives in [place] with spouse and son.   Previously worked as a Comptroller         Social Drivers of Corporate investment banker Strain: Low Risk  (08/26/2022)   Overall Financial Resource Strain (CARDIA)    Difficulty of Paying Living Expenses: Not very hard  Recent Concern: Financial Resource Strain - High Risk (06/23/2022)   Overall Financial Resource Strain (CARDIA)    Difficulty of Paying Living Expenses: Very hard  Food Insecurity: No Food Insecurity (07/17/2023)   Hunger Vital Sign    Worried About Running Out of Food in the Last Year: Never true    Ran Out of Food in the Last Year: Never  true  Transportation Needs: No Transportation Needs (07/17/2023)   PRAPARE - Administrator, Civil Service (Medical): No    Lack of Transportation (Non-Medical): No  Physical Activity: Insufficiently Active (08/26/2022)   Exercise Vital Sign    Days of Exercise per Week: 2 days    Minutes of Exercise per Session: 20 min  Stress: No Stress Concern Present (08/26/2022)   [employer] of Occupational Health - Occupational Stress Questionnaire    Feeling of Stress : Only a little  Social Connections: Socially Isolated (08/26/2022)   Social Connection and Isolation Panel    Frequency of Communication with Friends and Family: Once a week     Frequency of Social Gatherings with Friends and Family: Once a week    Attends Religious Services: Never    Diplomatic Services operational officer: No    Attends Engineer, structural: Not on file    Marital Status: Married    Objective:  BP 112/64   Pulse 98   Temp 98 F (36.7 C)   Ht 5' 5 (1.651 m)   Wt 250 lb (113.4 kg)   SpO2 96%   BMI 41.60 kg/m      10/19/2023   10:56 AM 10/13/2023   10:27 AM 09/28/2023   11:26 AM  BP/Weight  Systolic BP 112 102 110  Diastolic BP 64 70 74  Wt. (Lbs) 250 255 262  BMI 41.6 kg/m2 42.43 kg/m2 43.6 kg/m2    Physical Exam Vitals reviewed.  Constitutional:      Appearance: Normal appearance.  HENT:     Right Ear: Tympanic membrane, ear canal and external ear normal.     Left Ear: Tympanic membrane is erythematous.     Nose: Congestion present.     Right Turbinates: Enlarged and swollen.     Left Turbinates: Enlarged and swollen.     Right Sinus: Maxillary sinus tenderness present.     Left Sinus: Maxillary sinus tenderness present.     Mouth/Throat:     Pharynx: Oropharynx is clear.  Cardiovascular:     Rate and Rhythm: Normal rate and regular rhythm.     Heart sounds: Normal heart sounds. No murmur heard. Pulmonary:     Effort: Pulmonary effort is normal. No respiratory distress.     Breath sounds: Normal breath sounds.  Lymphadenopathy:     Cervical: No cervical adenopathy.  Neurological:     Mental Status: She is alert and oriented to person, place, and time.  Psychiatric:        Mood and Affect: Mood normal.        Behavior: Behavior normal.   f      Lab Results  Component Value Date   WBC 7.6 09/20/2023   HGB 14.0 09/20/2023   HCT 42.5 09/20/2023   PLT 214 09/20/2023   GLUCOSE 116 (H) 09/20/2023   CHOL 124 06/20/2023   TRIG 183 (H) 06/20/2023   HDL 36 (L) 06/20/2023   LDLCALC 57 06/20/2023   ALT 18 09/20/2023   AST 17 09/20/2023   NA 146 (H) 09/20/2023   K 3.9 09/20/2023   CL 109 (H)  09/20/2023   CREATININE 0.70 09/20/2023   BUN 24 09/20/2023   CO2 21 09/20/2023   TSH 1.550 08/26/2022   INR 1.0 05/13/2014   HGBA1C 5.7 09/28/2023      Assessment & Plan:  Other non-recurrent acute nonsuppurative otitis media of left ear Assessment & Plan: Bilateral ear infection with more severe  pain in the left ear, contributing to dizziness and hearing issues. - Administer Rocephin  shot, one gram. - Prescribe doxycycline . Complete Augmentin .  Orders: -     cefTRIAXone  Sodium -     Doxycycline  Hyclate; Take 1 tablet (100 mg total) by mouth 2 (two) times daily.  Dispense: 20 tablet; Refill: 0 -     Benzonatate ; Take 1 capsule (200 mg total) by mouth 3 (three) times daily as needed for cough.  Dispense: 30 capsule; Refill: 0  Acute non-recurrent maxillary sinusitis Assessment & Plan: Nasal congestion and cough likely due to acute upper respiratory infection, worsening insomnia and dizziness. - Prescribe Tessalon  Perles, 200 mg up to three times a day as needed for cough. - Advise continuation of Mucinex and Nyquil. - - Prescribe doxycycline . Complete Augmentin .  Orders: -     cefTRIAXone  Sodium -     Doxycycline  Hyclate; Take 1 tablet (100 mg total) by mouth 2 (two) times daily.  Dispense: 20 tablet; Refill: 0 -     Benzonatate ; Take 1 capsule (200 mg total) by mouth 3 (three) times daily as needed for cough.  Dispense: 30 capsule; Refill: 0  Acute bronchitis with COPD (HCC) Assessment & Plan: - Complete prednisone . - Prescribe Tessalon  Perles, 200 mg up to three times a day as needed for cough. - Advise continuation of Mucinex and Nyquil.  Orders: -     Benzonatate ; Take 1 capsule (200 mg total) by mouth 3 (three) times daily as needed for cough.  Dispense: 30 capsule; Refill: 0       Meds ordered this encounter  Medications   cefTRIAXone  (ROCEPHIN ) injection 1 g   doxycycline  (VIBRA -TABS) 100 MG tablet    Sig: Take 1 tablet (100 mg total) by mouth 2 (two) times  daily.    Dispense:  20 tablet    Refill:  0   benzonatate  (TESSALON ) 200 MG capsule    Sig: Take 1 capsule (200 mg total) by mouth 3 (three) times daily as needed for cough.    Dispense:  30 capsule    Refill:  0    No orders of the defined types were placed in this encounter.    Follow-up: Return if symptoms worsen or fail to improve.   I,Marla I Leal-Borjas,acting as a scribe for Abigail Free, MD.,have documented all relevant documentation on the behalf of Abigail Free, MD,as directed by  Abigail Free, MD while in the presence of Abigail Free, MD.    An After Visit Summary was printed and given to the patient.  I attest that I have reviewed this visit and agree with the plan scribed by my staff.   Abigail Free, MD [facility] [phone]

## 2023-10-19 NOTE — Assessment & Plan Note (Addendum)
 Nasal congestion and cough likely due to acute upper respiratory infection, worsening insomnia and dizziness. - Prescribe Tessalon  Perles, 200 mg up to three times a day as needed for cough. - Advise continuation of Mucinex and Nyquil. - - Prescribe doxycycline . Complete Augmentin .

## 2023-10-19 NOTE — Assessment & Plan Note (Addendum)
-   Complete prednisone . - Prescribe Tessalon  Perles, 200 mg up to three times a day as needed for cough. - Advise continuation of Mucinex and Nyquil.

## 2023-10-19 NOTE — Assessment & Plan Note (Addendum)
 Bilateral ear infection with more severe pain in the left ear, contributing to dizziness and hearing issues. - Administer Rocephin  shot, one gram. - Prescribe doxycycline . Complete Augmentin .

## 2023-10-25 ENCOUNTER — Other Ambulatory Visit (HOSPITAL_BASED_OUTPATIENT_CLINIC_OR_DEPARTMENT_OTHER): Payer: Self-pay

## 2023-10-25 DIAGNOSIS — M47812 Spondylosis without myelopathy or radiculopathy, cervical region: Secondary | ICD-10-CM | POA: Diagnosis not present

## 2023-10-25 MED ORDER — DIAZEPAM 5 MG PO TABS
ORAL_TABLET | ORAL | 0 refills | Status: DC
  Filled 2023-10-25: qty 2, 1d supply, fill #0

## 2023-10-27 ENCOUNTER — Ambulatory Visit (INDEPENDENT_AMBULATORY_CARE_PROVIDER_SITE_OTHER)

## 2023-10-27 ENCOUNTER — Other Ambulatory Visit (HOSPITAL_BASED_OUTPATIENT_CLINIC_OR_DEPARTMENT_OTHER): Payer: Self-pay

## 2023-10-27 DIAGNOSIS — Z23 Encounter for immunization: Secondary | ICD-10-CM | POA: Diagnosis not present

## 2023-10-27 MED ORDER — COMIRNATY 30 MCG/0.3ML IM SUSY
0.3000 mL | PREFILLED_SYRINGE | Freq: Once | INTRAMUSCULAR | 0 refills | Status: AC
  Filled 2023-10-27: qty 0.3, 1d supply, fill #0

## 2023-10-27 NOTE — Progress Notes (Signed)
 Patient came in today for nurse visit for Flu Vaccine. Patient was given FLUBLOK in the Right deltoid. Patient tolerated vaccine well

## 2023-10-28 ENCOUNTER — Encounter: Payer: Self-pay | Admitting: Family Medicine

## 2023-11-02 ENCOUNTER — Other Ambulatory Visit: Payer: Self-pay | Admitting: Family Medicine

## 2023-11-02 DIAGNOSIS — E7849 Other hyperlipidemia: Secondary | ICD-10-CM

## 2023-11-08 DIAGNOSIS — M47812 Spondylosis without myelopathy or radiculopathy, cervical region: Secondary | ICD-10-CM | POA: Diagnosis not present

## 2023-11-14 ENCOUNTER — Other Ambulatory Visit: Payer: Self-pay | Admitting: Family Medicine

## 2023-11-16 ENCOUNTER — Other Ambulatory Visit: Payer: Self-pay | Admitting: Family Medicine

## 2023-11-16 MED ORDER — DULOXETINE HCL 30 MG PO CPEP: 30.0000 mg | ORAL_CAPSULE | Freq: Every day | ORAL | 0 refills | Status: DC

## 2023-11-16 NOTE — Telephone Encounter (Signed)
 Copied from CRM [id number]. Topic: Clinical - Medication Question >> Nov 16, 2023 10:22 AM Judy [name] wrote: Reason for CRM: pt is requesting duloxetine  30 mg tablet to be filled through [facility]

## 2023-11-18 ENCOUNTER — Other Ambulatory Visit: Payer: Self-pay | Admitting: Family Medicine

## 2023-11-22 ENCOUNTER — Encounter: Payer: Self-pay | Admitting: Physician Assistant

## 2023-11-22 ENCOUNTER — Telehealth: Admitting: Physician Assistant

## 2023-11-22 ENCOUNTER — Ambulatory Visit: Payer: Self-pay

## 2023-11-22 ENCOUNTER — Other Ambulatory Visit (HOSPITAL_BASED_OUTPATIENT_CLINIC_OR_DEPARTMENT_OTHER): Payer: Self-pay

## 2023-11-22 VITALS — BP 106/68 | HR 82 | Temp 99.9°F

## 2023-11-22 DIAGNOSIS — U071 COVID-19: Secondary | ICD-10-CM

## 2023-11-22 DIAGNOSIS — J0100 Acute maxillary sinusitis, unspecified: Secondary | ICD-10-CM

## 2023-11-22 DIAGNOSIS — J209 Acute bronchitis, unspecified: Secondary | ICD-10-CM

## 2023-11-22 DIAGNOSIS — H65192 Other acute nonsuppurative otitis media, left ear: Secondary | ICD-10-CM

## 2023-11-22 MED ORDER — NIRMATRELVIR/RITONAVIR (PAXLOVID)TABLET
3.0000 | ORAL_TABLET | Freq: Two times a day (BID) | ORAL | 0 refills | Status: AC
  Filled 2023-11-22: qty 30, 5d supply, fill #0

## 2023-11-22 MED ORDER — BENZONATATE 200 MG PO CAPS
200.0000 mg | ORAL_CAPSULE | Freq: Three times a day (TID) | ORAL | 0 refills | Status: AC | PRN
  Filled 2023-11-22: qty 30, 10d supply, fill #0

## 2023-11-22 NOTE — Progress Notes (Signed)
 Virtual Visit via Telephone Note   This visit type was conducted due to national recommendations for restrictions regarding the COVID-19 Pandemic (e.g. social distancing) in an effort to limit this patient's exposure and mitigate transmission in our community.  Due to her co-morbid illnesses, this patient is at least at moderate risk for complications without adequate follow up.  This format is felt to be most appropriate for this patient at this time.  The patient did not have access to video technology/had technical difficulties with video requiring transitioning to audio format only (telephone).  All issues noted in this document were discussed and addressed.  No physical exam could be performed with this format.  Patient verbally consented to a telehealth visit.   Date:  11/22/2023   ID:  Judy [name], DOB 1961/01/18, MRN [id number]  Patient Location: Home Provider Location: Office  PCP:  Sherre Clapper, MD     Chief Complaint:  COVID  History of Present Illness:    Judy [name] is a 63 y.o. female with complaints of malaise, fever, congestion that started yesterday.  She just returned from a cruise.  She did take at home COVID test and is positive.  She states her oxygen  was hovering around 87 and she is now wearing her oxygen  at 2L which has increased it to 94.  She denies any chest pain or dyspnea otherwise.  She does have mucinex at home to take for congestion and requests refill of tessalon  perles for cough Pt has tolerated paxlovid  in the past for prior COVID infection  The patient does have symptoms concerning for COVID-19 infection (fever, chills, cough, or new shortness of breath).    Past Medical History:  Diagnosis Date   Anxiety    Back pain    Bilateral swelling of feet    CAD (coronary artery disease), native coronary artery    coronary CTA 01/2022 < 25% plaque in the mid RCA, LAD and OM1 with coronary Ca score of 1.23.   Chronic combined systolic and  diastolic CHF (congestive heart failure) (HCC) 10/07/2014   COPD (chronic obstructive pulmonary disease) (HCC)    DCM (dilated cardiomyopathy) (HCC) 04/25/2014   EF 28% by MRI despite maximum medical therapy   Depression    Diabetes mellitus without complication (HCC)    Epilepsy (HCC)    as a child   Fatty liver    Former tobacco use    Hyperlipidemia    Hypertension    Hypothyroidism    Joint pain    Leg swelling    Morbid obesity (HCC)    NICM (nonischemic cardiomyopathy) (HCC)    OSA (obstructive sleep apnea)    moderate with AHI 26/hr with oxygen  desaturations as low as 70%   Scoliosis    Sleep apnea    SOB (shortness of breath)    Past Surgical History:  Procedure Laterality Date   ABDOMINAL HYSTERECTOMY     ankle artery involved cyst removal     CARDIAC CATHETERIZATION  05/14/2014   normal coronary arteries   CARPAL TUNNEL RELEASE     FASCIOTOMY     1993 for platar fasciitis   harrington rod scoliosis     1981   LEFT HEART CATHETERIZATION WITH CORONARY ANGIOGRAM N/A 05/14/2014   Procedure: LEFT HEART CATHETERIZATION WITH CORONARY ANGIOGRAM;  Surgeon: Deatrice DELENA Cage, MD;  Location: [facility]ATH [facility];  Service: Cardiovascular;  Laterality: N/A;   ROTATOR CUFF REPAIR Right 06/2021   UMBILICAL HERNIA REPAIR  VESICOVAGINAL FISTULA CLOSURE W/ TAH       Current Meds  Medication Sig   Albuterol -Budesonide  (AIRSUPRA ) 90-80 MCG/ACT AERO Inhale 2 puffs into the lungs 4 (four) times daily as needed (dyspnea/wheezing).   aspirin  81 MG tablet Take 81 mg by mouth at bedtime.    atorvastatin  (LIPITOR) 20 MG tablet TAKE 1 TABLET EVERY DAY   buPROPion  (WELLBUTRIN  XL) 300 MG 24 hr tablet TAKE 1 TABLET EVERY DAY   carvedilol  (COREG ) 6.25 MG tablet TAKE 1 TABLET TWICE DAILY   clonazePAM  (KLONOPIN ) 0.5 MG tablet Take 1 tablet (0.5 mg total) by mouth 2 (two) times daily as needed for anxiety (panic attacks).   Coenzyme Q10 (CO Q 10) 100 MG CAPS Take 200 mg by mouth daily.    diphenhydrAMINE (BENADRYL) 25 MG tablet Take 50 mg by mouth at bedtime as needed.   Dulaglutide  (TRULICITY ) 3 MG/0.5ML SOPN Inject 3 mg as directed once a week.   DULoxetine  (CYMBALTA ) 30 MG capsule Take 1 capsule (30 mg total) by mouth daily.   empagliflozin (JARDIANCE) 10 MG TABS tablet Take by mouth daily.   fluticasone  (FLONASE ) 50 MCG/ACT nasal spray Place 2 sprays into both nostrils daily.   Fluticasone -Umeclidin-Vilant (TRELEGY ELLIPTA ) 100-62.5-25 MCG/ACT AEPB Inhale 1 puff into the lungs daily.   levothyroxine  (SYNTHROID ) 75 MCG tablet TAKE 1 TABLET EVERY DAY   lidocaine  (LIDODERM ) 5 % Place 3 patches onto the skin daily. Remove & Discard patch within 12 hours or as directed by MD   meloxicam  (MOBIC ) 15 MG tablet TAKE 1 TABLET EVERY DAY   Multiple Vitamin (MULTIVITAMIN WITH MINERALS) TABS tablet Take 1 tablet by mouth daily.   nirmatrelvir /ritonavir  (PAXLOVID ) 20 x 150 MG & 10 x 100MG  TABS Take 3 tablets by mouth 2 (two) times daily for 5 days. Patient GFR is 97   omeprazole  (PRILOSEC) 20 MG capsule Take 1 capsule (20 mg total) by mouth daily.   pregabalin  (LYRICA ) 300 MG capsule TAKE 1 CAPSULE TWICE DAILY   sacubitril -valsartan  (ENTRESTO ) 24-26 MG Take 1 tablet by mouth 2 (two) times daily.   STIOLTO RESPIMAT  2.5-2.5 MCG/ACT AERS INHALE 2 PUFFS BY MOUTH ONCE DAILY   tiZANidine  (ZANAFLEX ) 4 MG tablet TAKE 1 TABLET EVERY 6 HOURS AS NEEDED FOR MUSCLE SPASM(S)   UNABLE TO FIND CPAP with o2 2lpm  DME- [facility]   VASCEPA  1 g capsule TAKE 2 CAPSULES BY MOUTH TWICE A DAY   Vitamin D , Ergocalciferol , (DRISDOL ) 1.25 MG (50000 UNIT) CAPS capsule TAKE 1 CAPSULE EVERY 7 DAYS   [DISCONTINUED] benzonatate  (TESSALON ) 200 MG capsule Take 1 capsule (200 mg total) by mouth 3 (three) times daily as needed for cough.     Allergies:   Meperidine hcl and Meperidine   Social History   Tobacco Use   Smoking status: Former    Current packs/day: 0.00    Average packs/day: 1 pack/day for 20.0 years (20.0 ttl  pk-yrs)    Types: Cigarettes    Start date: 02/14/1985    Quit date: 02/14/2005    Years since quitting: 18.7   Smokeless tobacco: Never  Vaping Use   Vaping status: Never Used  Substance Use Topics   Alcohol use: Yes    Alcohol/week: 2.0 standard drinks of alcohol    Types: 2 Shots of liquor per week    Comment: socially   Drug use: No     Family Hx: The patient's family history includes Alcohol abuse in her father; Allergies in her father; Diabetes in her father  and mother; Emphysema in her father; Heart disease (age of onset: 22) in her father; Heart failure in her father; Hyperlipidemia in her father and mother; Hypertension in her father; Obesity in her father and mother; Thyroid  disease in her father. There is no history of Breast cancer.  ROS:   Please see the history of present illness.    All other systems reviewed and are negative.  Labs/Other Tests and Data Reviewed:    Recent Labs: 03/03/2023: NT-Pro BNP 43 09/20/2023: ALT 18; BUN 24; Creatinine, Ser 0.70; Hemoglobin 14.0; Platelets 214; Potassium 3.9; Sodium 146   Recent Lipid Panel Lab Results  Component Value Date/Time   CHOL 124 06/20/2023 08:33 AM   TRIG 183 (H) 06/20/2023 08:33 AM   HDL 36 (L) 06/20/2023 08:33 AM   CHOLHDL 3.4 06/20/2023 08:33 AM   CHOLHDL 3.6 07/12/2016 11:15 AM   LDLCALC 57 06/20/2023 08:33 AM    Wt Readings from Last 3 Encounters:  10/19/23 250 lb (113.4 kg)  10/13/23 255 lb (115.7 kg)  09/28/23 262 lb (118.8 kg)     Objective:    Vital Signs:  BP 106/68   Pulse 82   Temp 99.9 F (37.7 C)   SpO2 94%    VITAL SIGNS:  reviewed GEN:  no acute distress - no labored breathing noted  ASSESSMENT & PLAN:    COVID Rx for paxlovid  and tessalon  perles sent COVID-19 Education: The signs and symptoms of COVID-19 were discussed with the patient and how to seek care for testing (follow up with PCP or arrange E-visit). The importance of social distancing was discussed today.  Time:    Today, I have spent 10 minutes with the patient with telehealth technology discussing the above problems.     Medication Adjustments/Labs and Tests Ordered: Current medicines are reviewed at length with the patient today.  Concerns regarding medicines are outlined above.   Tests Ordered: No orders of the defined types were placed in this encounter.   Medication Changes: Meds ordered this encounter  Medications   nirmatrelvir /ritonavir  (PAXLOVID ) 20 x 150 MG & 10 x 100MG  TABS    Sig: Take 3 tablets by mouth 2 (two) times daily for 5 days. Patient GFR is 97    Dispense:  30 tablet    Refill:  0    Supervising Provider:   COX, KIRSTEN [[id number]]   benzonatate  (TESSALON ) 200 MG capsule    Sig: Take 1 capsule (200 mg total) by mouth 3 (three) times daily as needed for cough.    Dispense:  30 capsule    Refill:  0    Supervising Provider:   COX, KIRSTEN [id number]    Follow Up:  In Person prn  Signed, CAMIE JONELLE MOATS, PA-C  11/22/2023 10:06 AM    [facility]

## 2023-11-22 NOTE — Telephone Encounter (Signed)
 FYI Only or Action Required?: FYI only for provider.  Patient was last seen in primary care on 10/19/2023 by Sherre Clapper, MD.  Called Nurse Triage reporting Covid Positive.  Symptoms began yesterday.  Interventions attempted: Rest, hydration, or home remedies.  Symptoms are: gradually worsening.  Triage Disposition: Call PCP Within 24 Hours  Patient/caregiver understands and will follow disposition?: Yes   Copied from CRM #[id number]. Topic: Clinical - Medication Question >> Nov 22, 2023  7:57 AM Charlet HERO wrote: Reason for CRM: Patient is calling bc she has tested positive for covid and she does not want to come in to the office but would like to have meds called into the pharmacy for her to pick up. Please call the patient back to let know if meds will be called in.[facility] [address] [place] [address] Phone: [phone] Fax: [phone] Hours: Mon-Fri 8:30am-6pm Reason for Disposition  [1] Continuous (nonstop) coughing interferes with work or school AND [2] no improvement using cough treatment per Care Advice  Answer Assessment - Initial Assessment Questions 1. SYMPTOMS: What is your main symptom or concern? (e.g., cough, fever, shortness of breath, muscle aches)     Mild shortness of breath, nasal congestion, fever 99.9, pain in face 2. ONSET: When did the symptoms start?      Tested positive last night, symptoms started yesterday 3. COUGH: Do you have a cough? If Yes, ask: How bad is the cough?       Yes 4. FEVER: Do you have a fever? If Yes, ask: What is your temperature, how was it measured, and when did it start?     Elevated at 99.9  5. BREATHING DIFFICULTY: Are you having any difficulty breathing? (e.g., normal; shortness of breath, wheezing, unable to speak)      Patient states her breathing feels just a little worse than usual, has chronic lung issues 6. BETTER-SAME-WORSE: Are you getting better,  staying the same or getting worse compared to yesterday?  If getting worse, ask, In what way?     worse 7. OTHER SYMPTOMS: Do you have any other symptoms?  (e.g., chills, fatigue, headache, loss of smell or taste, muscle pain, sore throat)     Sinus pain pressure, elevated temperature, loss of smell or taste 8. COVID-19 DIAGNOSIS: How do you know that you have COVID? (e.g., positive lab test or self-test, diagnosed by doctor or NP/PA, symptoms after exposure).     Tested positive yesterday  9. COVID-19 EXPOSURE: Was there any known exposure to COVID before the symptoms began?      Patient flew on a plane Sunday 10. COVID-19 VACCINE: Have you had the COVID-19 vaccine? If Yes, ask: When did you last get it?       Yes  11. HIGH RISK DISEASE: Do you have any chronic medical problems? (e.g., asthma, heart or lung disease, weak immune system, obesity, etc.)       COPD, OSA 13. O2 SATURATION MONITOR:  Do you use an oxygen  saturation monitor (pulse oximeter) at home? If Yes, ask What is your reading (oxygen  level) today? What is your usual oxygen  saturation reading? (e.g., 95%)       Got down to 86% last night, right now 92% - baseline is normally 93-96% on good day  Protocols used: COVID-19 - Diagnosed or Suspected-A-AH

## 2023-11-30 DIAGNOSIS — M47812 Spondylosis without myelopathy or radiculopathy, cervical region: Secondary | ICD-10-CM | POA: Diagnosis not present

## 2023-12-04 ENCOUNTER — Other Ambulatory Visit: Payer: Self-pay | Admitting: Family Medicine

## 2023-12-12 ENCOUNTER — Telehealth: Payer: Self-pay

## 2023-12-12 NOTE — Telephone Encounter (Addendum)
 2026 Renewal  PAP: Patient assistance application for Jardiance through [employer]) has been mailed to pt's home address on file. Provider portion of application will be faxed to provider's office.  PAP: Patient assistance application for Trulicity  through [employer] has been mailed to pt's home address on file. Provider portion of application will be faxed to provider's office.  PAP: Patient assistance application for Trelegy Ellipta  through [employer] has been mailed to pt's home address on file. Provider portion of application will be faxed to provider's office.  Provider portion of application has been faxed to Dr. Abigail Free  Will obtain hard prescription for Trelegy once patient meet oop for 2026, pt. Is aware that she needs to spend $600 oop before we can submit to company     Will apply for [employer] for Entresto 

## 2023-12-13 ENCOUNTER — Other Ambulatory Visit (HOSPITAL_COMMUNITY): Payer: Self-pay

## 2023-12-13 ENCOUNTER — Telehealth: Payer: Self-pay

## 2023-12-13 NOTE — Telephone Encounter (Signed)
  The patient was approved for a [employer] grant that will help cover the cost of Entresto  Total amount awarded, $7,500.  Effective: 11/13/2023 - 11/11/2024   APW:[id number] ERW:[id number] Hmnle:[id number] [id number]   Pharmacy provided with approval and processing information. Patient informed via Phone  Judy [name] CPhT  Pharmacy Patient Advocate Specialist  Direct Number: [phone] Fax: [phone]

## 2023-12-13 NOTE — Telephone Encounter (Signed)
 Pharmacy Patient Advocate Encounter  Insurance verification completed.   The patient is insured through [employer]   Ran test claim for Entresto . Currently a quantity of 60 is a 30 day supply and the co-pay is $0 .   This test claim was processed through [facility]- copay amounts may vary at other pharmacies due to pharmacy/plan contracts, or as the patient moves through the different stages of their insurance plan.

## 2023-12-18 ENCOUNTER — Other Ambulatory Visit (HOSPITAL_BASED_OUTPATIENT_CLINIC_OR_DEPARTMENT_OTHER): Payer: Self-pay

## 2023-12-19 ENCOUNTER — Other Ambulatory Visit (HOSPITAL_BASED_OUTPATIENT_CLINIC_OR_DEPARTMENT_OTHER): Payer: Self-pay

## 2023-12-19 MED FILL — Icosapent Ethyl Cap 1 GM: ORAL | 90 days supply | Qty: 360 | Fill #0 | Status: AC

## 2023-12-21 DIAGNOSIS — E1142 Type 2 diabetes mellitus with diabetic polyneuropathy: Secondary | ICD-10-CM | POA: Diagnosis not present

## 2023-12-21 DIAGNOSIS — Z7951 Long term (current) use of inhaled steroids: Secondary | ICD-10-CM | POA: Diagnosis not present

## 2023-12-21 DIAGNOSIS — Z79890 Hormone replacement therapy (postmenopausal): Secondary | ICD-10-CM | POA: Diagnosis not present

## 2023-12-21 DIAGNOSIS — I2510 Atherosclerotic heart disease of native coronary artery without angina pectoris: Secondary | ICD-10-CM | POA: Diagnosis not present

## 2023-12-21 DIAGNOSIS — I700 Atherosclerosis of aorta: Secondary | ICD-10-CM | POA: Diagnosis not present

## 2023-12-21 DIAGNOSIS — G959 Disease of spinal cord, unspecified: Secondary | ICD-10-CM | POA: Diagnosis not present

## 2023-12-21 DIAGNOSIS — R0902 Hypoxemia: Secondary | ICD-10-CM | POA: Diagnosis not present

## 2023-12-21 DIAGNOSIS — M4030 Flatback syndrome, site unspecified: Secondary | ICD-10-CM | POA: Diagnosis not present

## 2023-12-21 DIAGNOSIS — I509 Heart failure, unspecified: Secondary | ICD-10-CM | POA: Diagnosis not present

## 2023-12-21 DIAGNOSIS — G4733 Obstructive sleep apnea (adult) (pediatric): Secondary | ICD-10-CM | POA: Diagnosis not present

## 2023-12-21 DIAGNOSIS — M62838 Other muscle spasm: Secondary | ICD-10-CM | POA: Diagnosis not present

## 2023-12-21 DIAGNOSIS — I110 Hypertensive heart disease with heart failure: Secondary | ICD-10-CM | POA: Diagnosis not present

## 2023-12-21 DIAGNOSIS — F411 Generalized anxiety disorder: Secondary | ICD-10-CM | POA: Diagnosis not present

## 2023-12-21 DIAGNOSIS — Z6841 Body Mass Index (BMI) 40.0 and over, adult: Secondary | ICD-10-CM | POA: Diagnosis not present

## 2023-12-21 DIAGNOSIS — E559 Vitamin D deficiency, unspecified: Secondary | ICD-10-CM | POA: Diagnosis not present

## 2023-12-21 DIAGNOSIS — B0223 Postherpetic polyneuropathy: Secondary | ICD-10-CM | POA: Diagnosis not present

## 2023-12-21 DIAGNOSIS — M8580 Other specified disorders of bone density and structure, unspecified site: Secondary | ICD-10-CM | POA: Diagnosis not present

## 2023-12-21 DIAGNOSIS — M4800 Spinal stenosis, site unspecified: Secondary | ICD-10-CM | POA: Diagnosis not present

## 2023-12-21 DIAGNOSIS — F321 Major depressive disorder, single episode, moderate: Secondary | ICD-10-CM | POA: Diagnosis not present

## 2023-12-21 DIAGNOSIS — R32 Unspecified urinary incontinence: Secondary | ICD-10-CM | POA: Diagnosis not present

## 2023-12-21 DIAGNOSIS — J4489 Other specified chronic obstructive pulmonary disease: Secondary | ICD-10-CM | POA: Diagnosis not present

## 2023-12-21 DIAGNOSIS — E1136 Type 2 diabetes mellitus with diabetic cataract: Secondary | ICD-10-CM | POA: Diagnosis not present

## 2023-12-21 DIAGNOSIS — H9193 Unspecified hearing loss, bilateral: Secondary | ICD-10-CM | POA: Diagnosis not present

## 2023-12-21 DIAGNOSIS — I429 Cardiomyopathy, unspecified: Secondary | ICD-10-CM | POA: Diagnosis not present

## 2023-12-21 DIAGNOSIS — E039 Hypothyroidism, unspecified: Secondary | ICD-10-CM | POA: Diagnosis not present

## 2023-12-21 DIAGNOSIS — Z7982 Long term (current) use of aspirin: Secondary | ICD-10-CM | POA: Diagnosis not present

## 2023-12-30 ENCOUNTER — Other Ambulatory Visit: Payer: Self-pay | Admitting: Family Medicine

## 2024-01-01 ENCOUNTER — Other Ambulatory Visit: Payer: Self-pay

## 2024-01-01 ENCOUNTER — Other Ambulatory Visit (HOSPITAL_COMMUNITY): Payer: Self-pay

## 2024-01-01 DIAGNOSIS — E1169 Type 2 diabetes mellitus with other specified complication: Secondary | ICD-10-CM

## 2024-01-01 DIAGNOSIS — E782 Mixed hyperlipidemia: Secondary | ICD-10-CM

## 2024-01-01 DIAGNOSIS — A09 Infectious gastroenteritis and colitis, unspecified: Secondary | ICD-10-CM

## 2024-01-01 NOTE — Telephone Encounter (Signed)
 PAP: Application for Trulicity  has been submitted to [employer], via fax   PAP: Application for Bernadine has been submitted to [employer]), via fax   Received patient portion of Trelegy application but can not process until 2026 and patient has met $600 out of pocket

## 2024-01-02 ENCOUNTER — Other Ambulatory Visit

## 2024-01-02 DIAGNOSIS — A09 Infectious gastroenteritis and colitis, unspecified: Secondary | ICD-10-CM

## 2024-01-02 DIAGNOSIS — E1169 Type 2 diabetes mellitus with other specified complication: Secondary | ICD-10-CM | POA: Diagnosis not present

## 2024-01-02 DIAGNOSIS — E782 Mixed hyperlipidemia: Secondary | ICD-10-CM

## 2024-01-02 DIAGNOSIS — E785 Hyperlipidemia, unspecified: Secondary | ICD-10-CM | POA: Diagnosis not present

## 2024-01-02 LAB — LIPID PANEL
Chol/HDL Ratio: 3 ratio (ref 0.0–4.4)
Cholesterol, Total: 151 mg/dL (ref 100–199)
HDL: 50 mg/dL (ref >39)
LDL Chol Calc (NIH): 77 mg/dL (ref 0–99)
Triglycerides: 136 mg/dL (ref 0–149)
VLDL Cholesterol Cal: 24 mg/dL (ref 5–40)

## 2024-01-02 LAB — COMPREHENSIVE METABOLIC PANEL WITH GFR
ALT: 22 IU/L (ref 0–32)
AST: 19 IU/L (ref 0–40)
Albumin: 4.3 g/dL (ref 3.9–4.9)
Alkaline Phosphatase: 163 IU/L — ABNORMAL HIGH (ref 49–135)
BUN/Creatinine Ratio: 35 — ABNORMAL HIGH (ref 12–28)
BUN: 24 mg/dL (ref 8–27)
Bilirubin Total: 1.2 mg/dL (ref 0.0–1.2)
CO2: 27 mmol/L (ref 20–29)
Calcium: 9.1 mg/dL (ref 8.7–10.3)
Chloride: 104 mmol/L (ref 96–106)
Creatinine, Ser: 0.68 mg/dL (ref 0.57–1.00)
Globulin, Total: 2.1 g/dL (ref 1.5–4.5)
Glucose: 100 mg/dL — ABNORMAL HIGH (ref 70–99)
Potassium: 4.2 mmol/L (ref 3.5–5.2)
Sodium: 141 mmol/L (ref 134–144)
Total Protein: 6.4 g/dL (ref 6.0–8.5)
eGFR: 98 mL/min/1.73 (ref >59)

## 2024-01-02 LAB — CBC WITH DIFFERENTIAL/PLATELET
Basophils Absolute: 0.1 x10E3/uL (ref 0.0–0.2)
Basos: 1 %
EOS (ABSOLUTE): 0.3 x10E3/uL (ref 0.0–0.4)
Eos: 4 %
Hematocrit: 46.7 % — ABNORMAL HIGH (ref 34.0–46.6)
Hemoglobin: 15.6 g/dL (ref 11.1–15.9)
Immature Grans (Abs): 0 x10E3/uL (ref 0.0–0.1)
Immature Granulocytes: 0 %
Lymphocytes Absolute: 1.5 x10E3/uL (ref 0.7–3.1)
Lymphs: 18 %
MCH: 31.6 pg (ref 26.6–33.0)
MCHC: 33.4 g/dL (ref 31.5–35.7)
MCV: 95 fL (ref 79–97)
Monocytes Absolute: 0.6 x10E3/uL (ref 0.1–0.9)
Monocytes: 7 %
Neutrophils Absolute: 5.5 x10E3/uL (ref 1.4–7.0)
Neutrophils: 70 %
Platelets: 267 x10E3/uL (ref 150–450)
RBC: 4.94 x10E6/uL (ref 3.77–5.28)
RDW: 13.6 % (ref 11.7–15.4)
WBC: 7.9 x10E3/uL (ref 3.4–10.8)

## 2024-01-02 LAB — HEMOGLOBIN A1C
Est. average glucose Bld gHb Est-mCnc: 111 mg/dL
Hgb A1c MFr Bld: 5.5 % (ref 4.8–5.6)

## 2024-01-03 ENCOUNTER — Ambulatory Visit: Payer: Self-pay | Admitting: Family Medicine

## 2024-01-03 NOTE — Progress Notes (Signed)
 Subjective:  Patient ID: Judy [name], female    DOB: [birth year]  Age: 63 y.o. MRN: [id number]  Chief Complaint  Patient presents with   Medical Management of Chronic Issues    HPI: Discussed the use of AI scribe software for clinical note transcription with the patient, who gave verbal consent to proceed.  History of Present Illness Judy [name] is a 63 year old female with severe COPD who presents with ear pain and hearing aid issues.  Otalgia and auditory canal symptoms - Ear pain and pruritus after resuming hearing aid use following a 16-year hiatus - One ear canal feels different from the other; affected ear is itchy and painful - Use of oil drops without symptomatic relief  Hearing aid tolerance issues - Difficulty with hearing aids after resuming use - Discomfort and altered sensation in one ear canal  Dyspnea and functional limitation - Severe chronic obstructive pulmonary disease (COPD) - Breathing remains challenging at times despite current therapy - Currently using Trelegy with some improvement in symptoms - Persistent exertional dyspnea with difficulty walking short distances (e.g., from kitchen to bathroom) - No recent evaluation by pulmonologist - Uncertain history of asthma  Glycemic control - Recent hemoglobin A1c of 5.5% - Currently using Trulicity  for diabetes management with good effect - Adequate supply of Trulicity ; no missed doses indicated  Constitutional and infectious symptoms - No recent illnesses - No fevers, chills, sweats, earaches, sore throat, stuffy nose, or chest pain  Neuropsychiatric symptoms - No recent mood changes       01/04/2024   12:06 PM 07/17/2023    9:18 AM 03/23/2023   10:51 AM 12/08/2022    3:22 PM 08/30/2022    3:41 PM  Depression screen PHQ 2/9  Decreased Interest 0 0 0 0 1  Down, Depressed, Hopeless 0 0 0 0 1  PHQ - 2 Score 0 0 0 0 2  Altered sleeping 2 0 1 2 2   Tired, decreased energy 0 1 1 1 2   Change in  appetite 1 0 1 0 1  Feeling bad or failure about yourself  0 0 1 0 0  Trouble concentrating 1 0 1 1   Moving slowly or fidgety/restless 0 0 1 0 0  Suicidal thoughts 0 0 0  0  PHQ-9 Score 4 1  6  4  7    Difficult doing work/chores Somewhat difficult Not difficult at all Not difficult at all Somewhat difficult      Data saved with a previous flowsheet row definition        03/23/2023   10:51 AM  Fall Risk   Falls in the past year? 0  Number falls in past yr: 0  Injury with Fall? 0  Risk for fall due to : No Fall Risks  Follow up Falls evaluation completed    Patient Care Team: Sherre Clapper, MD as PCP - General (Family Medicine) Nyle Rankin POUR, [facility] (Inactive) (Pharmacist) Maryl Lynwood Raddle., OD (Optometry) Shlomo Wilbert SAUNDERS, MD as Consulting Physician (Cardiology) Larnell Purchase, MD as Referring Physician (Orthopedic Surgery) Darlean Ozell NOVAK, MD as Consulting Physician (Pulmonary Disease) Pecen, Paula E, MD as Consulting Physician (Ophthalmology)   Review of Systems  All other systems reviewed and are negative.   Current Outpatient Medications on File Prior to Visit  Medication Sig Dispense Refill   Albuterol -Budesonide  (AIRSUPRA ) 90-80 MCG/ACT AERO Inhale 2 puffs into the lungs 4 (four) times daily as needed (dyspnea/wheezing). 32.1 g 3   aspirin  81  MG tablet Take 81 mg by mouth at bedtime.      atorvastatin  (LIPITOR) 20 MG tablet TAKE 1 TABLET EVERY DAY 90 tablet 3   benzonatate  (TESSALON ) 200 MG capsule Take 1 capsule (200 mg total) by mouth 3 (three) times daily as needed for cough. 30 capsule 0   buPROPion  (WELLBUTRIN  XL) 300 MG 24 hr tablet TAKE 1 TABLET EVERY DAY 90 tablet 3   carvedilol  (COREG ) 6.25 MG tablet TAKE 1 TABLET TWICE DAILY 180 tablet 2   clonazePAM  (KLONOPIN ) 0.5 MG tablet Take 1 tablet (0.5 mg total) by mouth 2 (two) times daily as needed for anxiety (panic attacks). 90 tablet 0   Coenzyme Q10 (CO Q 10) 100 MG CAPS Take 200 mg by mouth daily.      diphenhydrAMINE (BENADRYL) 25 MG tablet Take 50 mg by mouth at bedtime as needed.     Dulaglutide  (TRULICITY ) 3 MG/0.5ML SOPN Inject 3 mg as directed once a week. 2 mL 0   DULoxetine  (CYMBALTA ) 30 MG capsule Take 1 capsule (30 mg total) by mouth daily. 90 capsule 0   empagliflozin (JARDIANCE) 10 MG TABS tablet Take by mouth daily.     fluticasone  (FLONASE ) 50 MCG/ACT nasal spray Place 2 sprays into both nostrils daily. 48 mL 2   Fluticasone -Umeclidin-Vilant (TRELEGY ELLIPTA ) 100-62.5-25 MCG/ACT AEPB Inhale 1 puff into the lungs daily. 28 each 5   icosapent  Ethyl (VASCEPA ) 1 g capsule Take 2 capsules (2 g total) by mouth 2 (two) times daily. 360 capsule 1   JARDIANCE 25 MG TABS tablet TAKE ONE TABLET BY MOUTH DAILY 90 tablet 2   levothyroxine  (SYNTHROID ) 75 MCG tablet TAKE 1 TABLET EVERY DAY 90 tablet 3   lidocaine  (LIDODERM ) 5 % Place 3 patches onto the skin daily. Remove & Discard patch within 12 hours or as directed by MD 90 patch 3   meloxicam  (MOBIC ) 15 MG tablet TAKE 1 TABLET EVERY DAY 90 tablet 3   Multiple Vitamin (MULTIVITAMIN WITH MINERALS) TABS tablet Take 1 tablet by mouth daily.     omeprazole  (PRILOSEC) 20 MG capsule Take 1 capsule (20 mg total) by mouth daily. 90 capsule 3   OXYGEN  2lpm 2 lpm as needed     pregabalin  (LYRICA ) 300 MG capsule TAKE 1 CAPSULE TWICE DAILY 180 capsule 1   sacubitril -valsartan  (ENTRESTO ) 24-26 MG Take 1 tablet by mouth 2 (two) times daily. 180 tablet 3   STIOLTO RESPIMAT  2.5-2.5 MCG/ACT AERS INHALE 2 PUFFS BY MOUTH ONCE DAILY 12 each 2   tiZANidine  (ZANAFLEX ) 4 MG tablet TAKE 1 TABLET EVERY 6 HOURS AS NEEDED FOR MUSCLE SPASM(S) 360 tablet 3   UNABLE TO FIND CPAP with o2 2lpm  DME- [facility]     Vitamin D , Ergocalciferol , (DRISDOL ) 1.25 MG (50000 UNIT) CAPS capsule TAKE 1 CAPSULE EVERY 7 DAYS 12 capsule 3   No current facility-administered medications on file prior to visit.   Past Medical History:  Diagnosis Date   Anxiety    Back pain    Bilateral  swelling of feet    CAD (coronary artery disease), native coronary artery    coronary CTA 01/2022 < 25% plaque in the mid RCA, LAD and OM1 with coronary Ca score of 1.23.   Chronic combined systolic and diastolic CHF (congestive heart failure) (HCC) 10/07/2014   COPD (chronic obstructive pulmonary disease) (HCC)    DCM (dilated cardiomyopathy) (HCC) 04/25/2014   EF 28% by MRI despite maximum medical therapy   Depression    Diabetes  mellitus without complication (HCC)    Epilepsy (HCC)    as a child   Fatty liver    Former tobacco use    Hyperlipidemia    Hypertension    Hypothyroidism    Joint pain    Leg swelling    Morbid obesity (HCC)    NICM (nonischemic cardiomyopathy) (HCC)    OSA (obstructive sleep apnea)    moderate with AHI 26/hr with oxygen  desaturations as low as 70%   Scoliosis    Sleep apnea    SOB (shortness of breath)    Past Surgical History:  Procedure Laterality Date   ABDOMINAL HYSTERECTOMY     ankle artery involved cyst removal     CARDIAC CATHETERIZATION  05/14/2014   normal coronary arteries   CARPAL TUNNEL RELEASE     FASCIOTOMY     1993 for platar fasciitis   harrington rod scoliosis     1981   LEFT HEART CATHETERIZATION WITH CORONARY ANGIOGRAM N/A 05/14/2014   Procedure: LEFT HEART CATHETERIZATION WITH CORONARY ANGIOGRAM;  Surgeon: Deatrice DELENA Cage, MD;  Location: [facility] CATH [facility];  Service: Cardiovascular;  Laterality: N/A;   ROTATOR CUFF REPAIR Right 06/2021   UMBILICAL HERNIA REPAIR     VESICOVAGINAL FISTULA CLOSURE W/ TAH      Family History  Problem Relation Age of Onset   Diabetes Mother    Hyperlipidemia Mother    Obesity Mother    Emphysema Father    Allergies Father    Heart failure Father    Heart disease Father 47       MI   Diabetes Father    Hyperlipidemia Father    Hypertension Father    Thyroid  disease Father    Alcohol abuse Father    Obesity Father    Breast cancer Neg Hx    Social History   Socioeconomic History    Marital status: Married    Spouse name: Not on file   Number of children: Not on file   Years of education: Not on file   Highest education level: Some college, no degree  Occupational History   Occupation: disabled  Tobacco Use   Smoking status: Former    Current packs/day: 0.00    Average packs/day: 1 pack/day for 20.0 years (20.0 ttl pk-yrs)    Types: Cigarettes    Start date: 02/14/1985    Quit date: 02/14/2005    Years since quitting: 18.9   Smokeless tobacco: Never  Vaping Use   Vaping status: Never Used  Substance and Sexual Activity   Alcohol use: Yes    Alcohol/week: 2.0 standard drinks of alcohol    Types: 2 Shots of liquor per week    Comment: socially   Drug use: No   Sexual activity: Not Currently  Other Topics Concern   Not on file  Social History Narrative   Lives in [place] with spouse and son.   Previously worked as a comptroller         Social Drivers of Corporate Investment Banker Strain: Low Risk  (08/26/2022)   Overall Financial Resource Strain (CARDIA)    Difficulty of Paying Living Expenses: Not very hard  Recent Concern: Financial Resource Strain - High Risk (06/23/2022)   Overall Financial Resource Strain (CARDIA)    Difficulty of Paying Living Expenses: Very hard  Food Insecurity: No Food Insecurity (07/17/2023)   Hunger Vital Sign    Worried About Running Out of Food in the Last Year: Never  true    Ran Out of Food in the Last Year: Never true  Transportation Needs: No Transportation Needs (07/17/2023)   PRAPARE - Administrator, Civil Service (Medical): No    Lack of Transportation (Non-Medical): No  Physical Activity: Insufficiently Active (08/26/2022)   Exercise Vital Sign    Days of Exercise per Week: 2 days    Minutes of Exercise per Session: 20 min  Stress: No Stress Concern Present (08/26/2022)   [employer] of Occupational Health - Occupational Stress Questionnaire    Feeling of Stress : Only a little   Social Connections: Socially Isolated (08/26/2022)   Social Connection and Isolation Panel    Frequency of Communication with Friends and Family: Once a week    Frequency of Social Gatherings with Friends and Family: Once a week    Attends Religious Services: Never    Diplomatic Services Operational Officer: No    Attends Engineer, Structural: Not on file    Marital Status: Married    Objective:  BP 114/68   Pulse 88   Temp 98.1 F (36.7 C)   Ht 5' 5 (1.651 m)   Wt 257 lb (116.6 kg)   SpO2 90%   BMI 42.77 kg/m      01/04/2024   11:00 AM 11/22/2023    9:01 AM 10/19/2023   10:56 AM  BP/Weight  Systolic BP 114 106 112  Diastolic BP 68 68 64  Wt. (Lbs) 257  250  BMI 42.77 kg/m2  41.6 kg/m2    Physical Exam Vitals reviewed.  Constitutional:      Appearance: Normal appearance. She is obese.  Neck:     Vascular: No carotid bruit.  Cardiovascular:     Rate and Rhythm: Normal rate and regular rhythm.     Pulses: Normal pulses.     Heart sounds: Normal heart sounds.  Pulmonary:     Effort: Pulmonary effort is normal. No respiratory distress.     Breath sounds: Normal breath sounds.  Abdominal:     General: Abdomen is flat. Bowel sounds are normal.     Palpations: Abdomen is soft.     Tenderness: There is no abdominal tenderness.  Neurological:     Mental Status: She is alert and oriented to person, place, and time.  Psychiatric:        Mood and Affect: Mood normal.        Behavior: Behavior normal.      Diabetic foot exam was performed with the following findings:   No deformities, ulcerations, or other skin breakdown Normal sensation of 10g monofilament Intact posterior tibialis and dorsalis pedis pulses      Lab Results  Component Value Date   WBC 7.9 01/02/2024   HGB 15.6 01/02/2024   HCT 46.7 (H) 01/02/2024   PLT 267 01/02/2024   GLUCOSE 100 (H) 01/02/2024   CHOL 151 01/02/2024   TRIG 136 01/02/2024   HDL 50 01/02/2024   LDLCALC 77  01/02/2024   ALT 22 01/02/2024   AST 19 01/02/2024   NA 141 01/02/2024   K 4.2 01/02/2024   CL 104 01/02/2024   CREATININE 0.68 01/02/2024   BUN 24 01/02/2024   CO2 27 01/02/2024   TSH 1.550 08/26/2022   INR 1.0 05/13/2014   HGBA1C 5.5 01/02/2024    Results for orders placed or performed in visit on 01/02/24  CBC with Differential/Platelet   Collection Time: 01/02/24  8:28 AM  Result Value Ref  Range   WBC 7.9 3.4 - 10.8 x10E3/uL   RBC 4.94 3.77 - 5.28 x10E6/uL   Hemoglobin 15.6 11.1 - 15.9 g/dL   Hematocrit 53.2 (H) 65.9 - 46.6 %   MCV 95 79 - 97 fL   MCH 31.6 26.6 - 33.0 pg   MCHC 33.4 31.5 - 35.7 g/dL   RDW 86.3 88.2 - 84.5 %   Platelets 267 150 - 450 x10E3/uL   Neutrophils 70 Not Estab. %   Lymphs 18 Not Estab. %   Monocytes 7 Not Estab. %   Eos 4 Not Estab. %   Basos 1 Not Estab. %   Neutrophils Absolute 5.5 1.4 - 7.0 x10E3/uL   Lymphocytes Absolute 1.5 0.7 - 3.1 x10E3/uL   Monocytes Absolute 0.6 0.1 - 0.9 x10E3/uL   EOS (ABSOLUTE) 0.3 0.0 - 0.4 x10E3/uL   Basophils Absolute 0.1 0.0 - 0.2 x10E3/uL   Immature Granulocytes 0 Not Estab. %   Immature Grans (Abs) 0.0 0.0 - 0.1 x10E3/uL  Comprehensive metabolic panel with GFR   Collection Time: 01/02/24  8:28 AM  Result Value Ref Range   Glucose 100 (H) 70 - 99 mg/dL   BUN 24 8 - 27 mg/dL   Creatinine, Ser 9.31 0.57 - 1.00 mg/dL   eGFR 98 >40 fO/fpw/8.26   BUN/Creatinine Ratio 35 (H) 12 - 28   Sodium 141 134 - 144 mmol/L   Potassium 4.2 3.5 - 5.2 mmol/L   Chloride 104 96 - 106 mmol/L   CO2 27 20 - 29 mmol/L   Calcium  9.1 8.7 - 10.3 mg/dL   Total Protein 6.4 6.0 - 8.5 g/dL   Albumin  4.3 3.9 - 4.9 g/dL   Globulin, Total 2.1 1.5 - 4.5 g/dL   Bilirubin Total 1.2 0.0 - 1.2 mg/dL   Alkaline Phosphatase 163 (H) 49 - 135 IU/L   AST 19 0 - 40 IU/L   ALT 22 0 - 32 IU/L  Lipid panel   Collection Time: 01/02/24  8:28 AM  Result Value Ref Range   Cholesterol, Total 151 100 - 199 mg/dL   Triglycerides 863 0 - 149 mg/dL    HDL 50 >60 mg/dL   VLDL Cholesterol Cal 24 5 - 40 mg/dL   LDL Chol Calc (NIH) 77 0 - 99 mg/dL   Chol/HDL Ratio 3.0 0.0 - 4.4 ratio  Hemoglobin A1c   Collection Time: 01/02/24  8:28 AM  Result Value Ref Range   Hgb A1c MFr Bld 5.5 4.8 - 5.6 %   Est. average glucose Bld gHb Est-mCnc 111 mg/dL  .  Assessment & Plan:   Assessment & Plan COPD GOLD  III  Improved. Still has significant shortness of breath.  Continue trelegy and airsupra .       Chronic respiratory failure with hypoxia (HCC) Continue oxygen  2 L at night or if becomes dyspneic with exertion     Hyperlipidemia associated with type 2 diabetes mellitus (HCC) Diabetes and cholesterol at goal Recommend check feet daily. Recommend annual eye exams. Medicines: Continue Farxiga  10 mg once daily, atorvastatin  20 mg before bed, vascepa  1 gm 2 capsules twice daily, and coenzyme q10. Recommend start mounjaro 2.5 mg weekly samples given. Stop trulicity  when starts mounjaro.  Continue to work on eating a healthy diet and exercise.  Labs reviewed Neuropathy: continue lyrica  300 mg twice daily.      Other infective acute otitis externa of right ear Prescription: floxin . Orders:   ofloxacin  (FLOXIN ) 0.3 % OTIC solution; Place 5 drops  into the left ear daily.  Other non-recurrent acute nonsuppurative otitis media of right ear Prescription: amoxicillin  Orders:   amoxicillin  (AMOXIL ) 875 MG tablet; Take 1 tablet (875 mg total) by mouth 2 (two) times daily for 10 days.  Cervical radiculopathy Persistent neck pain with difficulty turning to the left. Previous lidocaine  injection provided temporary relief. Cortisone injection was less effective. Awaiting sedation for ablation, but insurance issues may delay procedure. - Await sedation for ablation procedure.    Other cardiomyopathy (HCC) Etiology of chf.     Hypertensive heart disease with chronic systolic congestive heart failure (HCC) Hypertension well controlled.  CONGESTIVE HEART FAILURE fairly controlled.  No changes to medicines. Continue farxiga  10 mg daily, Coreg  6.25 mg twice daily, Entresto  24-26 mg twice daily, ASA 81 mg daily Continue to work on eating a healthy diet and exercise.         Body mass index is 42.77 kg/m.   Reviewed labs.    Meds ordered this encounter  Medications   amoxicillin  (AMOXIL ) 875 MG tablet    Sig: Take 1 tablet (875 mg total) by mouth 2 (two) times daily for 10 days.    Dispense:  20 tablet    Refill:  0   ofloxacin  (FLOXIN ) 0.3 % OTIC solution    Sig: Place 5 drops into the left ear daily.    Dispense:  5 mL    Refill:  0    No orders of the defined types were placed in this encounter.   Follow-up: Return in about 3 months (around 04/05/2024) for chronic follow up.  An After Visit Summary was printed and given to the patient.  Abigail Free, MD [facility] [phone]

## 2024-01-04 ENCOUNTER — Other Ambulatory Visit (HOSPITAL_BASED_OUTPATIENT_CLINIC_OR_DEPARTMENT_OTHER): Payer: Self-pay

## 2024-01-04 ENCOUNTER — Ambulatory Visit: Admitting: Family Medicine

## 2024-01-04 VITALS — BP 114/68 | HR 88 | Temp 98.1°F | Ht 65.0 in | Wt 257.0 lb

## 2024-01-04 DIAGNOSIS — H65191 Other acute nonsuppurative otitis media, right ear: Secondary | ICD-10-CM

## 2024-01-04 DIAGNOSIS — J449 Chronic obstructive pulmonary disease, unspecified: Secondary | ICD-10-CM

## 2024-01-04 DIAGNOSIS — I428 Other cardiomyopathies: Secondary | ICD-10-CM | POA: Diagnosis not present

## 2024-01-04 DIAGNOSIS — H60391 Other infective otitis externa, right ear: Secondary | ICD-10-CM | POA: Diagnosis not present

## 2024-01-04 DIAGNOSIS — I110 Hypertensive heart disease with heart failure: Secondary | ICD-10-CM | POA: Diagnosis not present

## 2024-01-04 DIAGNOSIS — M5412 Radiculopathy, cervical region: Secondary | ICD-10-CM

## 2024-01-04 DIAGNOSIS — E785 Hyperlipidemia, unspecified: Secondary | ICD-10-CM

## 2024-01-04 DIAGNOSIS — Z7985 Long-term (current) use of injectable non-insulin antidiabetic drugs: Secondary | ICD-10-CM

## 2024-01-04 DIAGNOSIS — E1169 Type 2 diabetes mellitus with other specified complication: Secondary | ICD-10-CM | POA: Diagnosis not present

## 2024-01-04 DIAGNOSIS — J9611 Chronic respiratory failure with hypoxia: Secondary | ICD-10-CM | POA: Diagnosis not present

## 2024-01-04 DIAGNOSIS — I5022 Chronic systolic (congestive) heart failure: Secondary | ICD-10-CM

## 2024-01-04 MED ORDER — AMOXICILLIN 875 MG PO TABS
875.0000 mg | ORAL_TABLET | Freq: Two times a day (BID) | ORAL | 0 refills | Status: AC
  Filled 2024-01-04: qty 20, 10d supply, fill #0

## 2024-01-04 MED ORDER — OFLOXACIN 0.3 % OT SOLN
5.0000 [drp] | Freq: Every day | OTIC | 0 refills | Status: AC
  Filled 2024-01-04: qty 5, 20d supply, fill #0

## 2024-01-07 ENCOUNTER — Encounter: Payer: Self-pay | Admitting: Family Medicine

## 2024-01-07 DIAGNOSIS — H6090 Unspecified otitis externa, unspecified ear: Secondary | ICD-10-CM | POA: Insufficient documentation

## 2024-01-07 NOTE — Assessment & Plan Note (Signed)
 Persistent neck pain with difficulty turning to the left. Previous lidocaine  injection provided temporary relief. Cortisone injection was less effective. Awaiting sedation for ablation, but insurance issues may delay procedure. - Await sedation for ablation procedure.

## 2024-01-07 NOTE — Assessment & Plan Note (Signed)
 Prescription: floxin . Orders:   ofloxacin  (FLOXIN ) 0.3 % OTIC solution; Place 5 drops into the left ear daily.

## 2024-01-07 NOTE — Assessment & Plan Note (Signed)
 Hypertension well controlled. CONGESTIVE HEART FAILURE fairly controlled.  No changes to medicines. Continue farxiga  10 mg daily, Coreg  6.25 mg twice daily, Entresto  24-26 mg twice daily, ASA 81 mg daily Continue to work on eating a healthy diet and exercise.

## 2024-01-07 NOTE — Assessment & Plan Note (Signed)
 Improved. Still has significant shortness of breath.  Continue trelegy and airsupra .

## 2024-01-07 NOTE — Assessment & Plan Note (Signed)
 Persistent pain.  Management per specialist.

## 2024-01-07 NOTE — Assessment & Plan Note (Signed)
 Diabetes and cholesterol at goal Recommend check feet daily. Recommend annual eye exams. Medicines: Continue Farxiga  10 mg once daily, atorvastatin  20 mg before bed, vascepa  1 gm 2 capsules twice daily, and coenzyme q10. Recommend start mounjaro 2.5 mg weekly samples given. Stop trulicity  when starts mounjaro.  Continue to work on eating a healthy diet and exercise.  Labs reviewed Neuropathy: continue lyrica  300 mg twice daily.

## 2024-01-07 NOTE — Assessment & Plan Note (Signed)
Well controlled.  No changes to medicines. Continue farxiga 10 mg daily, Coreg 6.25 mg twice daily, Entresto 24-26 mg twice daily, ASA 81 mg daily Continue to work on eating a healthy diet and exercise.  Labs reviewed.

## 2024-01-07 NOTE — Assessment & Plan Note (Signed)
 Prescription: amoxicillin  Orders:   amoxicillin  (AMOXIL ) 875 MG tablet; Take 1 tablet (875 mg total) by mouth 2 (two) times daily for 10 days.

## 2024-01-07 NOTE — Assessment & Plan Note (Signed)
 Continue oxygen  2 L at night or if becomes dyspneic with exertion

## 2024-01-12 ENCOUNTER — Other Ambulatory Visit: Payer: Self-pay | Admitting: Family Medicine

## 2024-01-15 NOTE — Telephone Encounter (Signed)
 PAP: Patient assistance application for Trulicity  has been approved by PAP Companies: [employer] from 02/15/2024 to 12/31/20206. Medication should be delivered to PAP Delivery: Home. For further shipping updates, please contact [employer] at [phone]. Patient ID is: [id number]

## 2024-01-24 NOTE — Telephone Encounter (Signed)
 PAP: Patient assistance application for Jardiance has been approved by PAP Companies: [employer] from 02/15/2024 to 02/13/2025. Medication should be delivered to PAP Delivery: Home. For further shipping updates, please contact [employer] ([employer]) at [phone]. Patient ID is: no ID given per automated system

## 2024-01-25 ENCOUNTER — Encounter: Payer: Self-pay | Admitting: Cardiology

## 2024-02-15 ENCOUNTER — Other Ambulatory Visit: Payer: Self-pay | Admitting: Family Medicine

## 2024-02-15 ENCOUNTER — Other Ambulatory Visit: Payer: Self-pay | Admitting: Cardiology

## 2024-02-16 MED ORDER — CARVEDILOL 6.25 MG PO TABS: 6.2500 mg | ORAL_TABLET | Freq: Two times a day (BID) | ORAL | 0 refills | Status: AC

## 2024-02-16 NOTE — Addendum Note (Signed)
 Addended by: DARIO IZETTA CROME on: 02/16/2024 04:26 PM   Modules accepted: Orders

## 2024-03-14 MED FILL — Icosapent Ethyl Cap 1 GM: ORAL | 90 days supply | Qty: 360 | Fill #1 | Status: AC

## 2024-03-15 ENCOUNTER — Other Ambulatory Visit (HOSPITAL_BASED_OUTPATIENT_CLINIC_OR_DEPARTMENT_OTHER): Payer: Self-pay

## 2024-03-18 ENCOUNTER — Other Ambulatory Visit (HOSPITAL_BASED_OUTPATIENT_CLINIC_OR_DEPARTMENT_OTHER): Payer: Self-pay

## 2024-03-18 NOTE — Progress Notes (Unsigned)
" ° °  03/18/2024  Patient ID: Judy [name], female   DOB: Jul 09, 1960, 64 y.o.   MRN: [id number]  Reviewed grant information for [facility] and provided to pharmacy, successfully processed by [facility]  "

## 2024-04-09 ENCOUNTER — Other Ambulatory Visit

## 2024-04-10 ENCOUNTER — Other Ambulatory Visit

## 2024-04-11 ENCOUNTER — Ambulatory Visit: Admitting: Family Medicine
